# Patient Record
Sex: Male | Born: 1945 | Race: Black or African American | Hispanic: No | Marital: Married | Smoking: Current some day smoker
Health system: Southern US, Community
[De-identification: ages and names within clinical notes are randomized; demographics above are authoritative.]

## PROBLEM LIST (undated history)

## (undated) DIAGNOSIS — I639 Cerebral infarction, unspecified: Secondary | ICD-10-CM

## (undated) DIAGNOSIS — Z860100 Personal history of colon polyps, unspecified: Secondary | ICD-10-CM

## (undated) DIAGNOSIS — T7840XA Allergy, unspecified, initial encounter: Secondary | ICD-10-CM

## (undated) DIAGNOSIS — H269 Unspecified cataract: Secondary | ICD-10-CM

## (undated) DIAGNOSIS — Z720 Tobacco use: Secondary | ICD-10-CM

## (undated) DIAGNOSIS — R911 Solitary pulmonary nodule: Secondary | ICD-10-CM

## (undated) DIAGNOSIS — J432 Centrilobular emphysema: Secondary | ICD-10-CM

## (undated) DIAGNOSIS — M8580 Other specified disorders of bone density and structure, unspecified site: Secondary | ICD-10-CM

## (undated) DIAGNOSIS — R591 Generalized enlarged lymph nodes: Secondary | ICD-10-CM

## (undated) DIAGNOSIS — I2510 Atherosclerotic heart disease of native coronary artery without angina pectoris: Secondary | ICD-10-CM

## (undated) DIAGNOSIS — I700 Atherosclerosis of aorta: Secondary | ICD-10-CM

## (undated) DIAGNOSIS — E878 Other disorders of electrolyte and fluid balance, not elsewhere classified: Secondary | ICD-10-CM

## (undated) DIAGNOSIS — G610 Guillain-Barre syndrome: Secondary | ICD-10-CM

## (undated) DIAGNOSIS — E785 Hyperlipidemia, unspecified: Secondary | ICD-10-CM

## (undated) HISTORY — PX: DEEP NECK LYMPH NODE BIOPSY / EXCISION: SUR126

## (undated) HISTORY — DX: Tobacco use: Z72.0

## (undated) HISTORY — DX: Generalized enlarged lymph nodes: R59.1

## (undated) HISTORY — DX: Centrilobular emphysema: J43.2

## (undated) HISTORY — PX: OTHER SURGICAL HISTORY: SHX169

## (undated) HISTORY — DX: Cerebral infarction, unspecified: I63.9

## (undated) HISTORY — DX: Personal history of colonic polyps: Z86.010

## (undated) HISTORY — DX: Allergy, unspecified, initial encounter: T78.40XA

## (undated) HISTORY — DX: Guillain-Barre syndrome: G61.0

## (undated) HISTORY — PX: LYMPH NODE BIOPSY: SHX201

## (undated) HISTORY — DX: Other disorders of electrolyte and fluid balance, not elsewhere classified: E87.8

## (undated) HISTORY — DX: Unspecified cataract: H26.9

## (undated) HISTORY — DX: Atherosclerosis of aorta: I70.0

## (undated) HISTORY — DX: Hyperlipidemia, unspecified: E78.5

## (undated) HISTORY — DX: Personal history of colon polyps, unspecified: Z86.0100

## (undated) HISTORY — PX: SHOULDER ARTHROSCOPY: SHX128

## (undated) HISTORY — DX: Atherosclerotic heart disease of native coronary artery without angina pectoris: I25.10

## (undated) HISTORY — DX: Other specified disorders of bone density and structure, unspecified site: M85.80

## (undated) HISTORY — DX: Solitary pulmonary nodule: R91.1

---

## 1986-01-09 HISTORY — PX: HERNIA REPAIR: SHX51

## 2004-10-30 ENCOUNTER — Emergency Department: Payer: Self-pay | Admitting: Emergency Medicine

## 2004-10-30 ENCOUNTER — Other Ambulatory Visit: Payer: Self-pay

## 2005-01-05 ENCOUNTER — Ambulatory Visit: Payer: Self-pay | Admitting: Family Medicine

## 2006-01-10 ENCOUNTER — Ambulatory Visit: Payer: Self-pay

## 2006-01-12 ENCOUNTER — Ambulatory Visit: Payer: Self-pay

## 2007-02-22 ENCOUNTER — Ambulatory Visit: Payer: Self-pay | Admitting: Orthopaedic Surgery

## 2008-05-22 ENCOUNTER — Ambulatory Visit: Payer: Self-pay

## 2011-10-19 ENCOUNTER — Ambulatory Visit: Payer: Self-pay | Admitting: Otolaryngology

## 2013-07-09 DIAGNOSIS — G610 Guillain-Barre syndrome: Secondary | ICD-10-CM

## 2013-07-09 HISTORY — DX: Guillain-Barre syndrome: G61.0

## 2013-08-02 ENCOUNTER — Emergency Department: Payer: Self-pay | Admitting: Emergency Medicine

## 2013-08-02 LAB — COMPREHENSIVE METABOLIC PANEL
ANION GAP: 6 — AB (ref 7–16)
Albumin: 3.6 g/dL (ref 3.4–5.0)
Alkaline Phosphatase: 83 U/L
BUN: 12 mg/dL (ref 7–18)
Bilirubin,Total: 0.5 mg/dL (ref 0.2–1.0)
CHLORIDE: 104 mmol/L (ref 98–107)
Calcium, Total: 8.5 mg/dL (ref 8.5–10.1)
Co2: 29 mmol/L (ref 21–32)
Creatinine: 0.89 mg/dL (ref 0.60–1.30)
EGFR (Non-African Amer.): >60
Glucose: 104 mg/dL — ABNORMAL HIGH (ref 65–99)
OSMOLALITY: 278 (ref 275–301)
Potassium: 3.8 mmol/L (ref 3.5–5.1)
SGOT(AST): 46 U/L — ABNORMAL HIGH (ref 15–37)
SGPT (ALT): 39 U/L
Sodium: 139 mmol/L (ref 136–145)
Total Protein: 7.6 g/dL (ref 6.4–8.2)

## 2013-08-02 LAB — CBC
HCT: 44.9 % (ref 40.0–52.0)
HGB: 14.9 g/dL (ref 13.0–18.0)
MCH: 31.1 pg (ref 26.0–34.0)
MCHC: 33.2 g/dL (ref 32.0–36.0)
MCV: 94 fL (ref 80–100)
PLATELETS: 219 10*3/uL (ref 150–440)
RBC: 4.79 10*6/uL (ref 4.40–5.90)
RDW: 13.2 % (ref 11.5–14.5)
WBC: 6.1 10*3/uL (ref 3.8–10.6)

## 2013-08-02 LAB — CK TOTAL AND CKMB (NOT AT ARMC)
CK, Total: 491 U/L — ABNORMAL HIGH
CK-MB: 7.4 ng/mL — ABNORMAL HIGH (ref 0.5–3.6)

## 2013-08-02 LAB — PROTIME-INR
INR: 1.1
Prothrombin Time: 13.8 secs (ref 11.5–14.7)

## 2013-08-02 LAB — APTT: ACTIVATED PTT: 36.7 s — AB (ref 23.6–35.9)

## 2013-08-02 LAB — TROPONIN I: Troponin-I: <0.02 ng/mL

## 2013-08-02 LAB — LIPASE, BLOOD: Lipase: 172 U/L (ref 73–393)

## 2013-08-04 LAB — CBC WITH DIFFERENTIAL/PLATELET
Basophil #: 0.1 10*3/uL (ref 0.0–0.1)
Basophil %: 0.9 %
Eosinophil #: 0.1 10*3/uL (ref 0.0–0.7)
Eosinophil %: 1.8 %
HCT: 45.7 % (ref 40.0–52.0)
HGB: 14.9 g/dL (ref 13.0–18.0)
Lymphocyte #: 1.9 10*3/uL (ref 1.0–3.6)
Lymphocyte %: 33.5 %
MCH: 31 pg (ref 26.0–34.0)
MCHC: 32.6 g/dL (ref 32.0–36.0)
MCV: 95 fL (ref 80–100)
Monocyte #: 0.7 x10 3/mm (ref 0.2–1.0)
Monocyte %: 12.3 %
NEUTROS ABS: 2.9 10*3/uL (ref 1.4–6.5)
NEUTROS PCT: 51.5 %
PLATELETS: 231 10*3/uL (ref 150–440)
RBC: 4.81 10*6/uL (ref 4.40–5.90)
RDW: 13.1 % (ref 11.5–14.5)
WBC: 5.7 10*3/uL (ref 3.8–10.6)

## 2013-08-04 LAB — COMPREHENSIVE METABOLIC PANEL
ALBUMIN: 3.8 g/dL (ref 3.4–5.0)
AST: 38 U/L — AB (ref 15–37)
Alkaline Phosphatase: 77 U/L
Anion Gap: 6 — ABNORMAL LOW (ref 7–16)
BILIRUBIN TOTAL: 0.5 mg/dL (ref 0.2–1.0)
BUN: 11 mg/dL (ref 7–18)
Calcium, Total: 8.8 mg/dL (ref 8.5–10.1)
Chloride: 103 mmol/L (ref 98–107)
Co2: 28 mmol/L (ref 21–32)
Creatinine: 0.78 mg/dL (ref 0.60–1.30)
EGFR (African American): >60
EGFR (Non-African Amer.): >60
Glucose: 86 mg/dL (ref 65–99)
OSMOLALITY: 273 (ref 275–301)
Potassium: 3.7 mmol/L (ref 3.5–5.1)
SGPT (ALT): 33 U/L
Sodium: 137 mmol/L (ref 136–145)
TOTAL PROTEIN: 7.7 g/dL (ref 6.4–8.2)

## 2013-08-04 LAB — SEDIMENTATION RATE: ERYTHROCYTE SED RATE: 1 mm/h (ref 0–20)

## 2013-08-04 LAB — HEMOGLOBIN A1C: HEMOGLOBIN A1C: 6.7 % — AB (ref 4.2–6.3)

## 2013-08-04 LAB — TSH: THYROID STIMULATING HORM: 1.21 u[IU]/mL

## 2013-08-04 LAB — RAPID HIV SCREEN (HIV 1/2 AB+AG)

## 2013-08-05 LAB — CSF CC+PROT+GLU+CULT PANEL
CSF Tube #: 3
Eosinophil: 0 %
GLUCOSE, CSF: 66 mg/dL (ref 40–75)
Lymphocytes: 50 %
MONOCYTES/MACROPHAGES: 50 %
NEUTROS PCT: 0 %
Other Cells: 0 %
Protein, CSF: 30 mg/dL (ref 15–45)
RBC (CSF): 5 /mm3
WBC (CSF): 0 /mm3

## 2013-08-05 LAB — CBC WITH DIFFERENTIAL/PLATELET
Basophil #: 0.1 10*3/uL (ref 0.0–0.1)
Basophil %: 0.9 %
Eosinophil #: 0.1 10*3/uL (ref 0.0–0.7)
Eosinophil %: 2.3 %
HCT: 44.6 % (ref 40.0–52.0)
HGB: 14.4 g/dL (ref 13.0–18.0)
Lymphocyte #: 1.9 10*3/uL (ref 1.0–3.6)
Lymphocyte %: 32.8 %
MCH: 30.9 pg (ref 26.0–34.0)
MCHC: 32.3 g/dL (ref 32.0–36.0)
MCV: 96 fL (ref 80–100)
MONO ABS: 0.7 x10 3/mm (ref 0.2–1.0)
MONOS PCT: 12.4 %
Neutrophil #: 3 10*3/uL (ref 1.4–6.5)
Neutrophil %: 51.6 %
PLATELETS: 218 10*3/uL (ref 150–440)
RBC: 4.67 10*6/uL (ref 4.40–5.90)
RDW: 13 % (ref 11.5–14.5)
WBC: 5.8 10*3/uL (ref 3.8–10.6)

## 2013-08-05 LAB — BASIC METABOLIC PANEL
Anion Gap: 10 (ref 7–16)
BUN: 11 mg/dL (ref 7–18)
CALCIUM: 8.5 mg/dL (ref 8.5–10.1)
CHLORIDE: 101 mmol/L (ref 98–107)
CO2: 27 mmol/L (ref 21–32)
CREATININE: 0.77 mg/dL (ref 0.60–1.30)
EGFR (African American): >60
Glucose: 92 mg/dL (ref 65–99)
Osmolality: 275 (ref 275–301)
POTASSIUM: 3.5 mmol/L (ref 3.5–5.1)
Sodium: 138 mmol/L (ref 136–145)

## 2013-08-06 ENCOUNTER — Inpatient Hospital Stay: Payer: Self-pay | Admitting: Internal Medicine

## 2013-08-07 LAB — BASIC METABOLIC PANEL
ANION GAP: 7 (ref 7–16)
BUN: 14 mg/dL (ref 7–18)
CALCIUM: 9 mg/dL (ref 8.5–10.1)
CREATININE: 0.96 mg/dL (ref 0.60–1.30)
Chloride: 98 mmol/L (ref 98–107)
Co2: 28 mmol/L (ref 21–32)
EGFR (African American): >60
Glucose: 90 mg/dL (ref 65–99)
Osmolality: 266 (ref 275–301)
Potassium: 3.9 mmol/L (ref 3.5–5.1)
Sodium: 133 mmol/L — ABNORMAL LOW (ref 136–145)

## 2013-08-07 LAB — PROT IMMUNOELECTROPHORES(ARMC)

## 2013-08-07 LAB — CK: CK, Total: 261 U/L

## 2013-08-08 ENCOUNTER — Encounter: Payer: Self-pay | Admitting: *Deleted

## 2013-08-08 NOTE — PMR Pre-admission (Signed)
[facility] PMR Admission Coordinator Pre-Admission Assessment  Patient: Brent [name] is an 68 y.o., male MRN: [id number] DOB: 1945/06/21 Height: 5' 9"  (175.3 cm) Weight: 58.922 kg (129 lb 14.4 oz)  Insurance Information HMO:     PPO: yes     PCP:      IPA:      80/20:      OTHER:  PRIMARY: Medicare UHC      Policy#: [id number]      Subscriber: self CM Name: Gaylan Gerold, RN      Phone#:  [phone]    Fax#: onsite reviewer Christean Grief, RN reviewer gave approval for 7 days on 08-07-13. Follow up will be with onsite reviewer Gaylan Gerold. Pre-Cert#: [id number]      Employer: working part time Benefits:  Phone #: [phone]      Name: [name]. Date:      Deduct: none      Out of Pocket Max: $4000 (met $37.22)      Life Max: none CIR: $160/day copay for days 1-10      SNF: $0/day for days 1-20; $50/day for days 21-100 (100 day visit max) Outpatient: 100%     Co-Pay: $20 copay, no visit limits Home Health: 100%      Co-Pay: none, visits based on med necessity DME: 80%     Co-Pay: 20% Providers: in Therapist, art Information   Name Relation Home Work Fowler J  [phone]      Current Medical History  Patient Admitting Diagnosis: Guillain Barre Syndrome  History of Present Illness: This otherwise healthy 68 year old man with past medical history significant only for a recent emergency room visit for abdominal pain (which was attributed to reflux disease) presented to [facility] on 08/06/13 with bilateral hand and foot numbness with right eye ptosis. These symptoms were present upon waking up on the morning of 08-06-13. Pt reports that he was in his usual state of health the day prior. He describes a dense numbness of the hands and feet, up to the wrist and the feet up to the ankles with some aching in the left lower calf. He has no change in vision upon admit but now has  complaints of double vision with right eye ptosis remaining. Pt did report some spasticity and change in the motion of both legs. Pt also reports dizziness upon standing and fatigue. CT head was negative for acute changes and MRI brain was also negative for acute changes. 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 MRI lumbar spine was also non-remarkable. Pt with acute onset of polyneuropathy and work up began for Guillain-Barre syndrome. Lumbar puncture was performed and demonstrated normal protein in CSF. However, per neurologist, pt's clinical pattern follows this disease process and IVIG treatments were initiated (for a total of 5 IVIG treatments). Pt's final IVIG treatment was on 08-09-13. Therapy recommended inpatient rehab be considered and pt is progressing well with therapies though demonstrating a marked decline in his overall function from his very independent baseline.  Patient's medical [facility] from [facility] has been reviewed by the rehabilitation admission coordinator and physician.   Past Medical History  Pt does not take any chronic medications. Past surgical history includes: lymph node removal from the right neck (benign), colonoscopy and hernia repair.  Family History  family history is not on file.  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 Pt is very active per wife.   Home Assistive Devices / Equipment Home Assistive Devices/Equipment: None   Prior Functional Level Current Functional Level  Bed Mobility   Independent  Independent   Transfers  Independent  Mod assist (Minimal to moderate assist) Pt leans heavily against bed for support.    Mobility - Walk/Wheelchair  Independent  Mod assist (Pt ambulated 25' with rolling walker and moderate assist, significant steppage gait, limited knee control and inconsistent foot placement/step height)  Fatigue and poor activity tolerance noted.   Upper Body Dressing  Independent  Min assist   Lower Body Dressing  Independent  Mod assist   Grooming  Independent  Mod assist (sig. sensory issues with L UE/decreased grip L hand)   Eating/Drinking  Independent  Mod assist (difficulty holding utensil, issued built up handle)   Toilet Transfer  Independent  Mod assist   Bladder Continence   WFL  currently using urinal   Bowel Management  WFL  no incontinence, using bed pan   Stair Climbing   Independent  Other (not assessed)   Communication  [facility]  WFL   Memory  WFL  appears WFL   Cooking/Meal Prep  Independent      Housework  Independent    Money Management  Independent    Driving   Independent     Special needs/care consideration BiPAP/CPAP no  CPM no  Continuous Drip IV no  Dialysis no         Life Vest no Oxygen no  Special Bed no  Trach Size no  Wound Vac (area) no        Skin- no issues                               Bowel mgmt: last BM on 08-10-13  Bladder mgmt:using urinal or BSC with assist Diabetic mgmt no  Previous Home Environment Living Arrangements: Spouse/significant other  Lives With: Spouse Available Help at Discharge: Family Type of Home: House Home Layout: Two level (could stay on first floor but not preferred) Alternate Level Stairs-Number of Steps: flight of steps to 2nd floor bedrooms Home Access: Stairs to enter Entrance Stairs-Rails: Right Entrance Stairs-Number of Steps: 3  Discharge Living Setting Plans for Discharge Living Setting: Patient's home Type of Home at Discharge:  House Discharge Home Layout: Two level Alternate Level Stairs-Number of Steps: flight of steps to second floor bedroom Discharge Home Access: Stairs to enter Entrance Stairs-Rails: Right Entrance Stairs-Number of Steps: 3 Does the patient have any problems obtaining your medications?: No  Social/Family/Support Systems Patient Roles: Spouse;Other (Comment) (works part time driving a truck, does yard work for four houses. 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 Wife thought she would have to go into work one day/week and that family can stay with pt then. (Pt's dtr or neighbor) Caregiver Availability:  (essentially 24-7  as wife will also be working from home) Discharge Plan Discussed with Primary Caregiver: Yes (discussed with wife and pt on 7-30 and 7-31) Is Caregiver In Agreement with Plan?: Yes Does Caregiver/Family have Issues with Lodging/Transportation while Pt is in Rehab?: No  Goals/Additional Needs Patient/Family Goal for Rehab: Supervision and minimal assist with PT/OT; supervision with SLP Expected length of stay: 14-18 days Cultural Considerations: none Dietary Needs: regular mechanical soft diet, meds in puree, thin liquids with no straws Equipment Needs: to be determined Pt/Family Agrees to Admission and willing to participate: Yes Program Orientation Provided & Reviewed with Pt/Caregiver Including Roles  & Responsibilities: Yes  Patient Condition: I met with patient and his wife at [facility] on 08-07-13 and 08-08-13 to discuss the purpose of acute inpatient rehabilitation. This 68 year old patient was previously very independent working part time, doing yard work for [employer] and [employer] prior to  this new diagnosis of Guillain Barre syndrome. Pt is currently requiring minimal to moderate assist with transfers and moderate assist to ambulate 25 with a rolling walker, demonstrating a high steppage gait pattern. Pt's ability to perform self care skills has been compromised with pt needing moderate assistance overall with activities of daily living. In addition, pt currently needs a mechanical soft diet with meds in puree per speech recommendations. Pt will benefit from further skilled speech services to progress his diet needs and monitor for any progression of Guillain Barre impacting his swallow function. He will benefit greatly from the multi-disciplinary team of skilled PT, OT and rehab nursing to focus on increasing strength for greater independence in transfers, gait, self care skills and ambulation. In addition, rehab nursing can help address medication issues and provide pt/family education on medication issues/bowel/bladder care. Pt will benefit from rehab physician intervention to monitor any progression of Guillain Barre. Discussed case with rehab team and Dr. Naaman Plummer who stated that pt is a good inpatient rehab candidate. We received medical clearance from Dr. Leslye Peer at [facility] and insurance authorization from [facility] for inpatient rehab. Pt and his wife are motivated to come to inpatient rehab and to get pt as independent as possible for home. Pt will benefit from the intensive services of skilled therapy under rehab physician guidance and will be admitted today on 08-11-13.   Preadmission Screen Completed By:  Nanetta Batty PT, 08/11/2013 0918 am ______________________________________________________________________   Discussed status with Dr. Naaman Plummer on 08/11/13 at [phone] and received telephone approval for admission today.  Admission Coordinator:  Nanetta Batty PT, time 0918/Date 08/11/13   Assessment/Plan: Diagnosis: GBS with tetraplegia 1. Does the need for close, 24 hr/day   Medical supervision in concert with the patient's rehab needs make it unreasonable for this patient to be served in a less intensive setting? Yes 2. Co-Morbidities requiring supervision/potential complications: pain, bowel,bladder 3. Due to bladder management, bowel management, safety, skin/wound care, disease management, medication administration, pain management and patient education, does the patient require 24 hr/day rehab nursing? Yes 4. Does the patient require coordinated care of a physician, rehab nurse, PT (1-2 hrs/day, 5 days/week), OT (1-2 hrs/day, 5 days/week) and SLP (1-2 hrs/day, 5 days/week) to address physical and functional deficits in the context of the above medical diagnosis(es)? Yes Addressing deficits in the following areas: balance, endurance, locomotion, strength, transferring, bowel/bladder control, bathing, dressing, feeding, grooming, toileting, speech, swallowing and psychosocial support 5. Can the patient actively participate in an intensive therapy program of at least 3 hrs of therapy 5 days  a week? Yes 6. The potential for patient to make measurable gains while on inpatient rehab is excellent 7. Anticipated functional outcomes upon discharge from inpatients are: supervision and min assist PT, supervision and min assist OT, supervision SLP 8. Estimated rehab length of stay to reach the above functional goals is: 14-18 days 9. Does the patient have adequate social supports to accommodate these discharge functional goals? Yes 10. Anticipated D/C setting: Home 11. Anticipated post D/C treatments: HH therapy and Outpatient therapy 12. Overall Rehab/Functional Prognosis: excellent    RECOMMENDATIONS: This patient's condition is appropriate for continued rehabilitative care in the following setting: CIR Patient has agreed to participate in recommended program. Yes Note that insurance prior authorization may be required for reimbursement for recommended care.  Comment:  Admit to CIR today.   [name], MD, [place], [place] 8/03//2015

## 2013-08-11 ENCOUNTER — Encounter (HOSPITAL_COMMUNITY): Payer: Self-pay | Admitting: *Deleted

## 2013-08-11 ENCOUNTER — Other Ambulatory Visit: Payer: Self-pay | Admitting: Physical Medicine and Rehabilitation

## 2013-08-11 ENCOUNTER — Inpatient Hospital Stay (HOSPITAL_COMMUNITY)
Admission: RE | Admit: 2013-08-11 | Discharge: 2013-08-18 | DRG: 945 | Disposition: A | Discharge status: Home Health | Payer: PRIVATE HEALTH INSURANCE | Source: Intra-hospital | Attending: Physical Medicine & Rehabilitation | Admitting: Physical Medicine & Rehabilitation

## 2013-08-11 ENCOUNTER — Encounter: Payer: Self-pay | Admitting: Physical Medicine and Rehabilitation

## 2013-08-11 DIAGNOSIS — F17200 Nicotine dependence, unspecified, uncomplicated: Secondary | ICD-10-CM | POA: Diagnosis present

## 2013-08-11 DIAGNOSIS — H532 Diplopia: Secondary | ICD-10-CM | POA: Diagnosis present

## 2013-08-11 DIAGNOSIS — M47817 Spondylosis without myelopathy or radiculopathy, lumbosacral region: Secondary | ICD-10-CM | POA: Diagnosis present

## 2013-08-11 DIAGNOSIS — R110 Nausea: Secondary | ICD-10-CM | POA: Diagnosis not present

## 2013-08-11 DIAGNOSIS — Z833 Family history of diabetes mellitus: Secondary | ICD-10-CM

## 2013-08-11 DIAGNOSIS — R471 Dysarthria and anarthria: Secondary | ICD-10-CM | POA: Diagnosis present

## 2013-08-11 DIAGNOSIS — K5900 Constipation, unspecified: Secondary | ICD-10-CM | POA: Diagnosis present

## 2013-08-11 DIAGNOSIS — H538 Other visual disturbances: Secondary | ICD-10-CM | POA: Diagnosis present

## 2013-08-11 DIAGNOSIS — M5126 Other intervertebral disc displacement, lumbar region: Secondary | ICD-10-CM | POA: Diagnosis present

## 2013-08-11 DIAGNOSIS — E871 Hypo-osmolality and hyponatremia: Secondary | ICD-10-CM | POA: Diagnosis present

## 2013-08-11 DIAGNOSIS — Z7982 Long term (current) use of aspirin: Secondary | ICD-10-CM | POA: Diagnosis not present

## 2013-08-11 DIAGNOSIS — K219 Gastro-esophageal reflux disease without esophagitis: Secondary | ICD-10-CM | POA: Diagnosis present

## 2013-08-11 DIAGNOSIS — Z8669 Personal history of other diseases of the nervous system and sense organs: Secondary | ICD-10-CM | POA: Diagnosis present

## 2013-08-11 DIAGNOSIS — H02409 Unspecified ptosis of unspecified eyelid: Secondary | ICD-10-CM | POA: Diagnosis present

## 2013-08-11 DIAGNOSIS — R259 Unspecified abnormal involuntary movements: Secondary | ICD-10-CM | POA: Diagnosis present

## 2013-08-11 DIAGNOSIS — G610 Guillain-Barre syndrome: Secondary | ICD-10-CM | POA: Diagnosis present

## 2013-08-11 DIAGNOSIS — M549 Dorsalgia, unspecified: Secondary | ICD-10-CM

## 2013-08-11 DIAGNOSIS — Z5189 Encounter for other specified aftercare: Principal | ICD-10-CM

## 2013-08-11 DIAGNOSIS — R1310 Dysphagia, unspecified: Secondary | ICD-10-CM | POA: Diagnosis present

## 2013-08-11 DIAGNOSIS — Z79899 Other long term (current) drug therapy: Secondary | ICD-10-CM

## 2013-08-11 DIAGNOSIS — R292 Abnormal reflex: Secondary | ICD-10-CM | POA: Diagnosis present

## 2013-08-11 DIAGNOSIS — Z823 Family history of stroke: Secondary | ICD-10-CM | POA: Diagnosis not present

## 2013-08-11 MED ORDER — POLYETHYLENE GLYCOL 3350 17 G PO PACK
17.0000 g | PACK | Freq: Every day | ORAL | Status: DC | PRN
  Administered 2013-08-13 – 2013-08-17 (×5): 17 g via ORAL
  Filled 2013-08-11 (×4): qty 1

## 2013-08-11 MED ORDER — PANTOPRAZOLE SODIUM 40 MG PO TBEC
40.0000 mg | DELAYED_RELEASE_TABLET | Freq: Every day | ORAL | Status: DC
  Administered 2013-08-12 – 2013-08-18 (×7): 40 mg via ORAL
  Filled 2013-08-11 (×7): qty 1

## 2013-08-11 MED ORDER — PROCHLORPERAZINE EDISYLATE 5 MG/ML IJ SOLN
5.0000 mg | Freq: Four times a day (QID) | INTRAMUSCULAR | Status: DC | PRN
  Filled 2013-08-11: qty 2

## 2013-08-11 MED ORDER — ENOXAPARIN SODIUM 40 MG/0.4ML ~~LOC~~ SOLN
40.0000 mg | SUBCUTANEOUS | Status: DC
  Administered 2013-08-11 – 2013-08-17 (×7): 40 mg via SUBCUTANEOUS
  Filled 2013-08-11 (×8): qty 0.4

## 2013-08-11 MED ORDER — NICOTINE 7 MG/24HR TD PT24
7.0000 mg | MEDICATED_PATCH | Freq: Every day | TRANSDERMAL | Status: DC
  Administered 2013-08-12 – 2013-08-18 (×7): 7 mg via TRANSDERMAL
  Filled 2013-08-11 (×8): qty 1

## 2013-08-11 MED ORDER — ACETAMINOPHEN 325 MG PO TABS
325.0000 mg | ORAL_TABLET | ORAL | Status: DC | PRN
  Administered 2013-08-13 – 2013-08-16 (×5): 650 mg via ORAL
  Administered 2013-08-17: 325 mg via ORAL
  Administered 2013-08-17 – 2013-08-18 (×2): 650 mg via ORAL
  Filled 2013-08-11 (×10): qty 2

## 2013-08-11 MED ORDER — TRAZODONE HCL 50 MG PO TABS: 25.0000 mg | ORAL_TABLET | Freq: Every evening | ORAL | Status: DC | PRN

## 2013-08-11 MED ORDER — FLEET ENEMA 7-19 GM/118ML RE ENEM: 1.0000 | ENEMA | Freq: Once | RECTAL | Status: AC | PRN

## 2013-08-11 MED ORDER — ALUM & MAG HYDROXIDE-SIMETH 200-200-20 MG/5ML PO SUSP
30.0000 mL | ORAL | Status: DC | PRN
  Administered 2013-08-14 – 2013-08-16 (×2): 30 mL via ORAL
  Filled 2013-08-11 (×2): qty 30

## 2013-08-11 MED ORDER — OXYCODONE HCL 5 MG PO TABS
5.0000 mg | ORAL_TABLET | ORAL | Status: DC | PRN
  Administered 2013-08-11 – 2013-08-13 (×5): 10 mg via ORAL
  Administered 2013-08-13: 5 mg via ORAL
  Administered 2013-08-14 – 2013-08-15 (×8): 10 mg via ORAL
  Administered 2013-08-16: 5 mg via ORAL
  Administered 2013-08-17: 10 mg via ORAL
  Administered 2013-08-17 (×2): 5 mg via ORAL
  Administered 2013-08-17 – 2013-08-18 (×3): 10 mg via ORAL
  Filled 2013-08-11 (×18): qty 2
  Filled 2013-08-11 (×2): qty 1
  Filled 2013-08-11 (×5): qty 2

## 2013-08-11 MED ORDER — METHOCARBAMOL 500 MG PO TABS
500.0000 mg | ORAL_TABLET | Freq: Four times a day (QID) | ORAL | Status: DC | PRN
  Administered 2013-08-11 – 2013-08-17 (×9): 500 mg via ORAL
  Filled 2013-08-11 (×9): qty 1

## 2013-08-11 MED ORDER — PROCHLORPERAZINE MALEATE 5 MG PO TABS
5.0000 mg | ORAL_TABLET | Freq: Four times a day (QID) | ORAL | Status: DC | PRN
  Filled 2013-08-11: qty 2

## 2013-08-11 MED ORDER — ASPIRIN EC 81 MG PO TBEC
81.0000 mg | DELAYED_RELEASE_TABLET | Freq: Every day | ORAL | Status: DC
  Administered 2013-08-12 – 2013-08-18 (×7): 81 mg via ORAL
  Filled 2013-08-11 (×8): qty 1

## 2013-08-11 MED ORDER — BISACODYL 10 MG RE SUPP
10.0000 mg | Freq: Every day | RECTAL | Status: DC | PRN
  Administered 2013-08-17: 10 mg via RECTAL
  Filled 2013-08-11: qty 1

## 2013-08-11 MED ORDER — PROCHLORPERAZINE 25 MG RE SUPP
12.5000 mg | Freq: Four times a day (QID) | RECTAL | Status: DC | PRN
  Filled 2013-08-11: qty 1

## 2013-08-11 MED ORDER — GUAIFENESIN-DM 100-10 MG/5ML PO SYRP: 5.0000 mL | ORAL_SOLUTION | Freq: Four times a day (QID) | ORAL | Status: DC | PRN

## 2013-08-11 NOTE — Progress Notes (Signed)
Patient arrived onto unit via [facility]. Patient's wife at bedside oriented to rehab, all questions answered. Report given to on-coming nurses.

## 2013-08-12 ENCOUNTER — Inpatient Hospital Stay (HOSPITAL_COMMUNITY): Payer: PRIVATE HEALTH INSURANCE | Admitting: Speech Pathology

## 2013-08-12 ENCOUNTER — Inpatient Hospital Stay (HOSPITAL_COMMUNITY): Payer: Medicare Other

## 2013-08-12 ENCOUNTER — Inpatient Hospital Stay (HOSPITAL_COMMUNITY): Payer: PRIVATE HEALTH INSURANCE

## 2013-08-12 LAB — CBC WITH DIFFERENTIAL/PLATELET
BASOS PCT: 1 % (ref 0–1)
Basophils Absolute: 0.1 10*3/uL (ref 0.0–0.1)
EOS PCT: 1 % (ref 0–5)
Eosinophils Absolute: 0.1 10*3/uL (ref 0.0–0.7)
HEMATOCRIT: 40.2 % (ref 39.0–52.0)
HEMOGLOBIN: 14.5 g/dL (ref 13.0–17.0)
LYMPHS ABS: 1.8 10*3/uL (ref 0.7–4.0)
Lymphocytes Relative: 27 % (ref 12–46)
MCH: 32.2 pg (ref 26.0–34.0)
MCHC: 36.1 g/dL — ABNORMAL HIGH (ref 30.0–36.0)
MCV: 89.1 fL (ref 78.0–100.0)
MONOS PCT: 24 % — AB (ref 3–12)
Monocytes Absolute: 1.6 10*3/uL — ABNORMAL HIGH (ref 0.1–1.0)
Neutro Abs: 3.1 10*3/uL (ref 1.7–7.7)
Neutrophils Relative %: 47 % (ref 43–77)
Platelets: 234 10*3/uL (ref 150–400)
RBC: 4.51 MIL/uL (ref 4.22–5.81)
RDW: 12.7 % (ref 11.5–15.5)
WBC: 6.7 10*3/uL (ref 4.0–10.5)

## 2013-08-12 LAB — PATHOLOGIST SMEAR REVIEW: Path Review: REACTIVE

## 2013-08-12 LAB — COMPREHENSIVE METABOLIC PANEL
ALT: 25 U/L (ref 0–53)
AST: 44 U/L — ABNORMAL HIGH (ref 0–37)
Albumin: 3.5 g/dL (ref 3.5–5.2)
Alkaline Phosphatase: 75 U/L (ref 39–117)
Anion gap: 11 (ref 5–15)
BILIRUBIN TOTAL: 1.7 mg/dL — AB (ref 0.3–1.2)
BUN: 22 mg/dL (ref 6–23)
CHLORIDE: 89 meq/L — AB (ref 96–112)
CO2: 27 meq/L (ref 19–32)
Calcium: 9 mg/dL (ref 8.4–10.5)
Creatinine, Ser: 0.67 mg/dL (ref 0.50–1.35)
GLUCOSE: 88 mg/dL (ref 70–99)
Potassium: 4.6 mEq/L (ref 3.7–5.3)
Sodium: 127 mEq/L — ABNORMAL LOW (ref 137–147)
Total Protein: 9.6 g/dL — ABNORMAL HIGH (ref 6.0–8.3)

## 2013-08-12 MED ORDER — BOOST / RESOURCE BREEZE PO LIQD
1.0000 | Freq: Two times a day (BID) | ORAL | Status: DC
  Administered 2013-08-12 – 2013-08-16 (×6): 1 via ORAL
  Administered 2013-08-17: 08:00:00 via ORAL
  Administered 2013-08-17: 8 via ORAL
  Administered 2013-08-18: 1 via ORAL

## 2013-08-12 NOTE — Evaluation (Signed)
Occupational Therapy Assessment and Plan  Patient Details  Name: Brent [name] MRN: [id number] Date of Birth: 20-Nov-1945  OT Diagnosis: disturbance of vision, lumbago (low back pain), muscle weakness (generalized) and pain in joint Rehab Potential: Rehab Potential: Excellent ELOS: 5-7 days   Today's Date: 08/12/2013 Time: 0830-0930 Time Calculation (min): 60 min  Problem List:  Patient Active Problem List   Diagnosis Date Noted  . 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 Medical history non-contributory    Past Surgical History:  Past Surgical History  Procedure Laterality Date  . Hernia repair    . Lymph node biopsy    . Deep neck lymph node biopsy / excision Right   . Shoulder    . Shoulder arthroscopy Right     Assessment & Plan Clinical Impression: Patient is a 68 y.o. year old male with past medical history significant only for a recent emergency room visit for abdominal pain (which was attributed to reflux disease) presented to [facility] on 08/06/13 with bilateral hand and foot numbness with right eye ptosis. These symptoms were present upon waking up on the morning of 08-06-13. Pt reports that he was in his usual state of health the day prior. He describes a dense numbness of the hands and feet, up to the wrist and the feet up to the ankles with some aching in the left lower calf. He has no change in vision upon admit but now has complaints of double vision with right eye ptosis remaining. Pt did report some spasticity and change in the motion of both legs. Pt also reports dizziness upon standing and fatigue. CT head was negative for acute changes and MRI brain was also negative for acute changes. 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 MRI lumbar spine was also non-remarkable. Pt with acute onset of polyneuropathy and work up began for  Guillain-Barre syndrome. Lumbar puncture was performed and demonstrated normal protein in CSF. However, per neurologist, pt's clinical pattern follows this disease process and IVIG treatments were initiated (for a total of 5 IVIG treatments). Pt's final IVIG treatment was on 08-09-13. Therapy recommended inpatient rehab be considered and pt is progressing well with therapies though demonstrating a marked decline in his overall function from his very independent baseline.  Patient transferred to CIR on 08/11/2013.    Patient currently requires minimum assistance with basic self-care skills secondary to muscle weakness.  Prior to hospitalization, patient could complete BADL/iADL independently.   Patient will benefit from skilled intervention to increase independence with basic self-care skills prior to discharge home independently.  Anticipate patient will require intermittent distant supervision and follow up home health to progress with complex iADL.  OT - End of Session Activity Tolerance: Tolerates 30+ min activity with multiple rests Endurance Deficit: Yes OT Assessment Rehab Potential: Excellent OT Patient demonstrates impairments in the following area(s): Balance;Endurance;Pain;Safety;Vision OT Basic ADL's Functional Problem(s): Bathing;Dressing;Toileting OT Advanced ADL's Functional Problem(s): Simple Meal Preparation;Light Housekeeping OT Transfers Functional Problem(s): Toilet;Tub/Shower OT Additional Impairment(s): Other (comment) OT Plan OT Intensity: Minimum of 1-2 x/day, 45 to 90 minutes OT Frequency: 5 out of 7 days OT Duration/Estimated Length of Stay: 5-7 days OT Treatment/Interventions: Balance/vestibular training;Discharge planning;DME/adaptive equipment instruction;Patient/family education;Therapeutic Activities;Therapeutic Exercise;UE/LE Coordination activities;UE/LE Strength taining/ROM OT Self Feeding Anticipated Outcome(s): Independent OT Basic Self-Care Anticipated  Outcome(s): Mod I OT Toileting Anticipated Outcome(s): Mod I OT Bathroom Transfers  Anticipated Outcome(s): Mod I OT Recommendation Patient destination: Home Follow Up Recommendations: Home health OT Equipment Recommended: To be determined   Skilled Therapeutic Intervention OT 1:1 initial evaluation completed with treatment provided to address safety awareness, dynamic standing balance, pain management, functional transfers and training on effective use of DME/AE.  Pt completed shower level bathing but had no clothing for dressing and demo'd moderate fatigue near end of session.   Pt completed ADL at overall min assist level d/t low back pain with need for physical assist with lower body dressing (socks) and intermittent steadying while standing during ADL.  OT Evaluation Precautions/Restrictions  Precautions Precautions: Fall Restrictions Weight Bearing Restrictions: No  General Chart Reviewed: Yes Family/Caregiver Present: No  Pain Pain Assessment Pain Assessment: 0-10 Pain Score: 8  Pain Type: Acute pain Pain Location: Back Pain Orientation: Lower Pain Descriptors / Indicators: Spasm;Cramping Pain Onset: On-going Patients Stated Pain Goal: 3 Pain Intervention(s): Medication (See eMAR) Multiple Pain Sites: Yes 2nd Pain Site Pain Score: 10 Pain Type: Acute pain Pain Location: Hand Pain Orientation: Left Pain Radiating Towards: thumb, IP joint Pain Descriptors / Indicators: Shooting Pain Frequency: Intermittent Pain Onset: With Activity  Home Living/Prior Functioning Home Living Available Help at Discharge: Family Type of Home: House Home Access: Stairs to enter Technical brewer of Steps: 3 Entrance Stairs-Rails: Right Home Layout: Two level Alternate Level Stairs-Number of Steps: flight of 13 steps to 2nd floor bedrooms Alternate Level Stairs-Rails: Right  Lives With: Spouse IADL History Homemaking Responsibilities: Yes Meal Prep Responsibility:  Primary Laundry Responsibility: No Cleaning Responsibility: Primary Bill Paying/Finance Responsibility: Secondary Shopping Responsibility: Secondary Child Care Responsibility: No Homemaking Comments: Pt cooks wife bakes; pt does all housework (heavy housework) Current License: Yes Occupation: Part time employment Prior Function  Able to Take Stairs?: Yes Driving: Yes Vocation: Part time employment Vocation Requirements: bending, lifting, pulling, pushing, dynamic standing, endurance, [place] Leisure: Hobbies-yes (Comment)  ADL ADL ADL Comments: see FIM  Vision/Perception  Vision- History Baseline Vision/History: Wears glasses Wears Glasses: Reading only Patient Visual Report: Eye fatigue/eye pain/headache (right eye weakness) Vision- Assessment Vision Assessment?: Vision impaired- to be further tested in functional context Perception Comments: WFL   Cognition Overall Cognitive Status: Within Functional Limits for tasks assessed Arousal/Alertness: Awake/alert Orientation Level: Oriented X4 Attention: Alternating Alternating Attention: Appears intact Memory: Impaired Memory Impairment: Retrieval deficit (word-finding deficits) Awareness: Appears intact Problem Solving: Appears intact Safety/Judgment: Appears intact  Sensation Sensation Light Touch: Impaired Detail Light Touch Impaired Details: Impaired RUE;Impaired LUE;Impaired RLE;Impaired LLE Stereognosis: Appears Intact Hot/Cold: Appears Intact Proprioception: Impaired Detail Proprioception Impaired Details: Impaired LUE Additional Comments: decreased sensation at fingertips and feet below ankles.   Reports decreased coordination with left hand [facility]) Coordination Gross Motor Movements are Fluid and Coordinated: Yes Fine Motor Movements are Fluid and Coordinated: No (clumsiness with left hand)  Motor  Motor Motor: Within Functional Limits  Mobility  Bed Mobility Bed Mobility: Rolling Right;Supine to Sit;Sit to  Supine;Scooting to [facility] Rolling Right: 6: Modified independent (Device/Increase time) Supine to Sit: 6: Modified independent (Device/Increase time) Sit to Supine: 6: Modified independent (Device/Increase time) Scooting to [facility]: 6: Modified independent (Device/Increase time) Transfers Transfers: Sit to Stand;Stand to Sit Sit to Stand: 4: Min assist Sit to Stand Details: Verbal cues for technique;Verbal cues for precautions/safety Stand to Sit: 4: Min assist Stand to Sit Details (indicate cue type and reason): Verbal cues for technique;Verbal cues for precautions/safety   Trunk/Postural Assessment  Cervical Assessment Cervical Assessment: Within Functional Limits Thoracic Assessment Thoracic Assessment:  Within Functional Limits Lumbar Assessment Lumbar Assessment: Within Functional Limits Postural Control Postural Control: Within Functional Limits   Balance Balance Balance Assessed: Yes  Extremity/Trunk Assessment RUE Assessment RUE Assessment: Within Functional Limits LUE Assessment LUE Assessment: Within Functional Limits  FIM:  FIM - Bathing Bathing Steps Patient Completed: Chest;Right Arm;Left Arm;Abdomen;Front perineal area;Buttocks;Right upper leg;Left upper leg;Right lower leg (including foot);Left lower leg (including foot) Bathing: 5: Supervision: Safety issues/verbal cues FIM - Upper Body Dressing/Undressing Upper body dressing/undressing: 0: Wears gown/pajamas-no public clothing FIM - Lower Body Dressing/Undressing Lower body dressing/undressing: 0: Wears Interior and spatial designer FIM - Control and instrumentation engineer Devices: Copy: 6: Supine > Sit: No assist;4: Bed > Chair or W/C: Min A (steadying Pt. > 75%);4: Chair or W/C > Bed: Min A (steadying Pt. > 75%) FIM - Radio producer Devices: Mining engineer Transfers: 4-To toilet/BSC: Min A (steadying Pt. > 75%);4-From toilet/BSC: Min A  (steadying Pt. > 75%) FIM - Tub/Shower Transfers Tub/Shower Assistive Devices: Tub transfer bench;Walk in shower;Walker;Grab bars Tub/shower Transfers: 4-Into Tub/Shower: Min A (steadying Pt. > 75%/lift 1 leg);4-Out of Tub/Shower: Min A (steadying Pt. > 75%/lift 1 leg)   Refer to Care Plan for Long Term Goals  Recommendations for other services: None  Discharge Criteria: Patient will be discharged from OT if patient refuses treatment 3 consecutive times without medical reason, if treatment goals not met, if there is a change in medical status, if patient makes no progress towards goals or if patient is discharged from hospital.  The above assessment, treatment plan, treatment alternatives and goals were discussed and mutually agreed upon: by patient  [facility] 08/12/2013, 12:29 PM

## 2013-08-12 NOTE — Progress Notes (Signed)
[facility] Admission Coordinator Signed Physical Medicine and Rehabilitation PMR Pre-admission Service date: 08/08/2013 12:58 PM  Related encounter: Documentation from 08/08/2013 in [facility] PMR Admission Coordinator Pre-Admission Assessment   Patient: Brent [name] is an 68 y.o., male MRN: [id number] DOB: [birth year] Height: 5' 9"  (175.3 cm) Weight: 58.922 kg (129 lb 14.4 oz)   Insurance Information HMO:     PPO: yes     PCP:      IPA:      80/20:      OTHER:   PRIMARY: Medicare U[employer]      Policy#: [id number]      Subscriber: self CM Name: Brent Gerold, RN      Phone#:  [phone]    Fax#: onsite reviewer Christean Grief, RN reviewer gave approval for 7 days on 08-07-13. Follow up will be with onsite reviewer Brent [name]. Pre-Cert#: [id number]      Employer: working part time Benefits:  Phone #: [phone]        Name: [name]. Date:      Deduct: none      Out of Pocket Max: $4000 (met $37.22)      Life Max: none CIR: $160/day copay for days 1-10      SNF: $0/day for days 1-20; $50/day for days 21-100 (100 day visit max) Outpatient: 100%     Co-Pay: $20 copay, no visit limits Home Health: 100%      Co-Pay: none, visits based on med necessity DME: 80%     Co-Pay: 20% Providers: in Therapist, art Information     Name  Relation  Home  Work  Harper J    [phone]  [phone]  [phone]         Current Medical History  Patient Admitting Diagnosis: Guillain Barre Syndrome   History of Present Illness: This otherwise healthy 68 year old man with past medical history significant only for a recent emergency room visit for abdominal pain (which was attributed to reflux disease) presented to [facility] on 08/06/13 with bilateral hand and foot numbness with right eye ptosis. These symptoms were present upon waking up on the morning of  08-06-13. Pt reports that he was in his usual state of health the day prior. He describes a dense numbness of the hands and feet, up to the wrist and the feet up to the ankles with some aching in the left lower calf. He has no change in vision upon admit but now has complaints of double vision with right eye ptosis remaining. Pt did report some spasticity and change in the motion of both legs. Pt also reports dizziness upon standing and fatigue. CT head was negative for acute changes and MRI brain was also negative for acute changes. 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 MRI lumbar spine was also non-remarkable. Pt with acute onset of polyneuropathy and work up began for Guillain-Barre syndrome. Lumbar puncture was performed and demonstrated normal protein in CSF. However, per neurologist, pt's clinical pattern follows this disease process and IVIG treatments were initiated (for a total of 5 IVIG treatments). Pt's final IVIG treatment was on 08-09-13. Therapy recommended inpatient rehab be considered and pt is progressing well with therapies though demonstrating a marked decline in his overall function from his very independent baseline.   Patient[facility] has been reviewed by the rehabilitation admission coordinator and physician.     Past Medical History  Pt does not take any chronic medications. Past surgical history includes: lymph node removal from the right neck (benign), colonoscopy and hernia repair.   Family History  family history is not on file.   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 Pt is very active per wife.    Home Assistive Devices / Equipment Home Assistive Devices/Equipment: None      Prior Functional Level  Current Functional Level   Bed Mobility   Independent   Independent    Transfers   Independent   Mod assist (Minimal to moderate assist) Pt leans heavily against bed for support.      Mobility - Walk/Wheelchair   Independent   Mod assist (Pt ambulated 25' with rolling walker and moderate assist, significant steppage gait, limited knee control and inconsistent foot placement/step height)   Fatigue and poor activity tolerance noted.    Upper Body Dressing   Independent   Min assist    Lower Body Dressing   Independent   Mod assist    Grooming   Independent   Mod assist (sig. sensory issues with L UE/decreased grip L hand)    Eating/Drinking   Independent   Mod assist (difficulty holding utensil, issued built up handle)    Toilet Transfer   Independent   Mod assist    Bladder Continence     WFL   currently using urinal    Bowel Management   WFL   no incontinence, using bed pan    Stair Climbing   Independent   Other (not assessed)    Communication   [facility]   WFL    Memory   WFL   appears WFL    Cooking/Meal Prep   Independent        Housework   Independent      Money Management   Independent      Driving   Independent         Special needs/care consideration BiPAP/CPAP no   CPM no   Continuous Drip IV no   Dialysis no          Life Vest no Oxygen no   Special Bed no   Trach Size no   Wound Vac (area) no         Skin- no issues                                Bowel mgmt: last BM on 08-10-13          Bladder mgmt:using urinal or BSC with assist Diabetic mgmt no   Previous Home Environment Living Arrangements: Spouse/significant other  Lives With: Spouse Available Help at Discharge:  Family Type of Home: House Home Layout: Two level (could stay on first floor but not preferred) Alternate Level Stairs-Number of Steps: flight of steps to 2nd floor bedrooms Home Access: Stairs to enter Entrance Stairs-Rails: Right Entrance Stairs-Number of Steps: 3  Discharge Living Setting Plans for Discharge Living Setting: Patient's home Type of Home at Discharge: House Discharge Home Layout: Two level Alternate Level Stairs-Number of Steps: flight of steps to second floor bedroom Discharge Home Access: Stairs to enter Entrance Stairs-Rails: Right Entrance Stairs-Number of Steps: 3 Does the patient have any problems obtaining your medications?: No   Social/Family/Support Systems Patient Roles: Spouse;Other (Comment) (works part time driving a truck, 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 Wife thought she would have to go into work one day/week and that family can stay with pt then. (Pt's dtr or neighbor) Caregiver Availability:  (essentially 24-7 as wife will also be working from home) Discharge Plan Discussed with Primary Caregiver: Yes (discussed with wife and pt on 7-30 and 7-31) Is Caregiver In Agreement with Plan?: Yes Does Caregiver/Family have Issues with Lodging/Transportation while Pt is in Rehab?: No   Goals/Additional Needs Patient/Family Goal for Rehab: Supervision and minimal assist with PT/OT; supervision with SLP Expected length of stay: 14-18 days Cultural Considerations: none Dietary Needs: regular mechanical soft diet, meds in puree, thin liquids with no straws Equipment Needs: to be determined Pt/Family Agrees to Admission and willing to participate: Yes Program Orientation Provided  & Reviewed with Pt/Caregiver Including Roles  & Responsibilities: Yes   Patient Condition: I met with patient and his wife at [facility] on 08-07-13 and 08-08-13 to discuss the purpose of acute inpatient rehabilitation. This 68 year old patient was previously very independent working part time, doing yard work for [employer] and [employer] prior to this new diagnosis of Guillain Barre syndrome. Pt is currently requiring minimal to moderate assist with transfers and moderate assist to ambulate 25 with a rolling walker, demonstrating a high steppage gait pattern. Pt's ability to perform self care skills has been compromised with pt needing moderate assistance overall with activities of daily living. In addition, pt currently needs a mechanical soft diet with meds in puree per speech recommendations. Pt will benefit from further skilled speech services to progress his diet needs and monitor for any progression of Guillain Barre impacting his swallow function. He will benefit greatly from the multi-disciplinary team of skilled PT, OT and rehab nursing to focus on increasing strength for greater independence in transfers, gait, self care skills and ambulation. In addition, rehab nursing can help address medication issues and provide pt/family education on medication issues/bowel/bladder care. Pt will benefit from rehab physician intervention to monitor any progression of Guillain Barre. Discussed case with rehab team and Dr. Naaman Plummer who stated that pt is a good inpatient rehab candidate. We received medical clearance from Dr. Leslye Peer at [facility] and insurance authorization from [facility] for inpatient rehab. Pt and his wife are motivated to come to inpatient rehab and to get pt as independent as possible for home. Pt will benefit from the intensive services of skilled therapy under rehab physician guidance and will be admitted today on 08-11-13.     Preadmission Screen Completed By:   Nanetta Batty PT, 08/11/2013 0918 am ______________________________________________________________________    Discussed status with Dr. Naaman Plummer on 08/11/13 at [phone] and received telephone approval for admission today.   Admission Coordinator:  Nanetta Batty PT, time 0918/Date 08/11/13    Assessment/Plan: Diagnosis: GBS with tetraplegia  Does the need for close, 24 hr/day  Medical supervision in concert with the patient's rehab needs make it unreasonable for this patient to be served in a less intensive setting? Yes Co-Morbidities requiring supervision/potential complications: pain, bowel,bladder Due to bladder management, bowel management, safety, skin/wound care, disease management, medication administration, pain management and patient education, does the patient require 24 hr/day rehab nursing? Yes Does the patient require coordinated care of a physician, rehab nurse, PT (1-2 hrs/day, 5 days/week), OT (1-2 hrs/day, 5 days/week) and SLP (1-2 hrs/day, 5 days/week) to address physical and functional deficits in the context of the above medical diagnosis(es)? Yes Addressing deficits in the following areas: balance, endurance, locomotion, strength, transferring, bowel/bladder control, bathing, dressing, feeding, grooming, toileting, speech, swallowing and psychosocial support Can the patient actively participate in an intensive therapy program of at least 3 hrs of therapy 5 days a week? Yes The potential for patient to make measurable gains while on inpatient rehab is excellent Anticipated functional outcomes upon discharge from inpatients are: supervision and min assist PT, supervision and min assist OT, supervision SLP Estimated rehab length of stay to reach the above functional goals is: 14-18 days Does the patient have adequate social supports to accommodate these discharge functional goals? Yes Anticipated D/C setting: Home Anticipated post D/C treatments: HH therapy and Outpatient  therapy Overall Rehab/Functional Prognosis: excellent       RECOMMENDATIONS: This patient's condition is appropriate for continued rehabilitative care in the following setting: CIR Patient has agreed to participate in recommended program. Yes Note that insurance prior authorization may be required for reimbursement for recommended care.   Comment: Admit to CIR today.    [name], MD, [place], [place] 8/03//2015  Revision History...     Date/Time User Action   08/11/2013 9:40 AM [name], MD Sign   08/11/2013 9:27 AM [place] Details Report

## 2013-08-12 NOTE — Evaluation (Signed)
Speech Language Pathology Assessment and Plan  Patient Details  Name: Brent [name] MRN: [id number] Date of Birth: [birth year]  SLP Diagnosis: Dysarthria;Dysphagia  Rehab Potential: Excellent ELOS: 07/18/13   Today's Date: 08/12/2013 Time: 1330-1430 Time Calculation (min): 60 min  Problem List:  Patient Active Problem List   Diagnosis Date Noted  . 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 Medical history non-contributory    Past Surgical History:  Past Surgical History  Procedure Laterality Date  . Hernia repair    . Lymph node biopsy    . Deep neck lymph node biopsy / excision Right   . Shoulder    . Shoulder arthroscopy Right     Assessment / Plan / Recommendation Clinical Impression Patient is a 68 year old male who was admitted to [facility] on 08/06/13 with abdominal pain with GERD two days prior to sudden onset of numbness bilateral hand and feet as well as right ptosis. He also reported spasms BLE as well as dizziness with standing. Labs done revealed low Na-131 with normal renal status and CT head without acute abnormality. 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 Neurology consulted and recommended LP for work up revealed glucose 66, protein 30, CSF without growth. CSF electrophoresis betaglobulin--21.4, IgA/IgG studies WNL. UDS positive for barbiturates. Random urine porphyrins--coproporphyrin I-56 and coproporphyrin III- 113. Labs negative for HIV 1 and 2, ANA-negative, B 12- 627. Patient treated with IVIG X 5 days for GBS. Pt admitted to CIR on 08/11/13 and was administered a cognitive-linguistic evaluation and a BSE. Pt's overall cognitive-linguistic function appears to be [facility] and pt was 100% intelligible at the conversation level, however, pt  demonstrated decreased vocal intensity. Pt was also given trials of thin liquids, puree and dys. 3 textures and demonstrated a delayed throat clear with his initial bite of puree textures, however, no other overt s/s of aspiration were noted with trials. Recommend patient continue his current diet of Dys. 3 textures with thin liquids. Pt would benefit from skilled SLP intervention to maximize his swallowing and speech function in order to maximize his overall functional independence.   Skilled Therapeutic Interventions          Administered a cognitive-linguistic evaluation and BSE. Please see above for details.   SLP Assessment  Patient will need skilled Speech Lanaguage Pathology Services during CIR admission    Recommendations  Diet Recommendations: Dysphagia 3 (Mechanical Soft);Thin liquid Liquid Administration via: Cup;No straw Medication Administration: Whole meds with puree Supervision: Patient able to self feed Compensations: Slow rate;Small sips/bites Postural Changes and/or Swallow Maneuvers: Upright 30-60 min after meal;Seated upright 90 degrees Oral Care Recommendations: Oral care BID Patient destination: Home Follow up Recommendations: None Equipment Recommended: None recommended by SLP    SLP Frequency 5 out of 7 days   SLP Treatment/Interventions Cueing hierarchy;Environmental controls;Internal/external aids;Functional tasks;Dysphagia/aspiration precaution training;Patient/family education;Therapeutic Activities;Speech/Language facilitation    Pain Pain Assessment Pain Score: 3  Pain Intervention(s): Heat applied  Short Term Goals: Week 1: SLP Short Term Goal 1 (Week 1): Pt will consume his current diet and utilize his swallowing compensatory strategies with minimal overt s/s of aspiration with Mod I .  SLP Short Term Goal 2 (Week 1): Pt will demonstrate efficient mastication with trials of regular textures with Mod I.  SLP Short Term Goal 3 (Week 1): Pt will utilize an  increased vocal intensity at the phrase level with Mod I.   See FIM for current functional status Refer to Care Plan for Long Term Goals  Recommendations for other services: None  Discharge Criteria: Patient will be discharged from SLP if patient refuses treatment 3 consecutive times without medical reason, if treatment goals not met, if there is a change in medical status, if patient makes no progress towards goals or if patient is discharged from hospital.  The above assessment, treatment plan, treatment alternatives and goals were discussed and mutually agreed upon: by patient  Brent [name] 08/12/2013, 3:29 PM

## 2013-08-12 NOTE — Progress Notes (Signed)
INITIAL NUTRITION ASSESSMENT  Pt meets criteria for SEVERE MALNUTRITION in the context of acute illness/injury as evidenced by a 9% weight loss in one week, moderate fat loss, severe muscle depletion, and consuming </= 50% energy intake for >/= 5 days.  DOCUMENTATION CODES Per approved criteria  -Severe malnutrition in the context of acute illness or injury -Underweight   INTERVENTION: Resource Breeze po BID between meals, each supplement provides 250 kcal and 9 grams of protein.  Will continue to monitor.  NUTRITION DIAGNOSIS: Malnutrition related to illness as evidenced by 9% weight loss in one week, moderate fat loss, severe muscle depletion, and consuming </= 50% energy intake for >/= 5 days.   Goal: Pt to meet >/= 90% of their estimated nutrition needs.  Monitor:  PO intake, supplement acceptance, weight trends, labs, I/O's  Reason for Assessment: MST  68 y.o. male  Admitting Dx: Rosalee Kaufman Syndrome  ASSESSMENT: Pt with PMH significant only for a recent emergency room visit for abdominal pain (which was attributed to reflux disease). Presented with bilateral hand and foot numbness with right eye ptosis. Pt was diagnosed with Guillain Barre syndrome.  Spoke with pt. Pt reports he currently has a good appetite and has been eating most of his meals. Meal completion 75%. Pt however reports last week and the week before that he did have a decreased appetite along with eating less (half of his meals). Pt contributes this to his illness and his treatment for his Guillain Barre Syndrome. Pt reports he has lost weight over the past 2 weeks with his usual body weight of 126-130 lbs, which he reports he last weighed 2 weeks ago. Pt has had a 9% weight loss in 1 week, according to Epic records.   Pt reports he currently does not have any problems eating and denies any stomach pains. Pt is willing to try Resource Breeze to help increase his calorie and protein intake. Will order.  Offered Ensure to the pt as well, pt reports he does not want Ensure.  Nutrition Focused Physical Exam:  Subcutaneous Fat:  Orbital Region: N/A Upper Arm Region: WNL Thoracic and Lumbar Region: Moderate depletion  Muscle:  Temple Region: Severe depletion Clavicle Bone Region: Moderate depletion Clavicle and Acromion Bone Region: Moderate depletion Scapular Bone Region: N/A Dorsal Hand: Severe depletion Patellar Region: Severe depletion Anterior Thigh Region: Severe depletion Posterior Calf Region: Severe depletion  Edema: none  Labs:Low sodium and chloride High AST and total billirubin   Height: Ht Readings from Last 1 Encounters:  08/11/13 5\' 9"  (1.753 m)    Weight: Wt Readings from Last 1 Encounters:  08/11/13 117 lb 1 oz (53.1 kg)    Ideal Body Weight: 160 lbs  % Ideal Body Weight: 73%  Wt Readings from Last 10 Encounters:  08/11/13 117 lb 1 oz (53.1 kg)  08/08/13 129 lb 14.4 oz (58.922 kg)    Usual Body Weight: 126-130 lbs  % Usual Body Weight: 90-93%  BMI:  Body mass index is 17.28 kg/(m^2). Underweight  Estimated Nutritional Needs: Kcal: 1800-2000 Protein: 85-95 grams Fluid: 1.8 L- 2L/day  Skin: no issues noted  Diet Order: Dysphagia 3  EDUCATION NEEDS: -No education needs identified at this time   Intake/Output Summary (Last 24 hours) at 08/12/13 0925 Last data filed at 08/12/13 0838  Gross per 24 hour  Intake    360 ml  Output    125 ml  Net    235 ml    Last BM: 8/2  Labs:   Recent Labs Lab 08/12/13 0730  NA 127*  K 4.6  CL 89*  CO2 27  BUN 22  CREATININE 0.67  CALCIUM 9.0  GLUCOSE 88    CBG (last 3)  No results found for this basename: GLUCAP,  in the last 72 hours  Scheduled Meds: . aspirin EC  81 mg Oral Daily  . enoxaparin (LOVENOX) injection  40 mg Subcutaneous Q24H  . nicotine  7 mg Transdermal Daily  . pantoprazole  40 mg Oral Daily    Continuous Infusions:   Past Medical History  Diagnosis Date  .  Medical history non-contributory     Past Surgical History  Procedure Laterality Date  . Hernia repair    . Lymph node biopsy    . Deep neck lymph node biopsy / excision Right   . Shoulder    . Shoulder arthroscopy Right     Kallie Locks, MS, Provisional LDN Pager # [phone] After hours/ weekend pager # [phone]

## 2013-08-12 NOTE — H&P (Signed)
[facility] Admission H&P  CC: Numbness and weakness BLE/Bilateral hands with right ptosis.  HPI: Mr. Brent [name] is a healthy 68 year old male who was admitted to [facility] on 08/06/13 with abdominal pain with GERD two days prior to sudden onset of numbness bilateral hand and feet as well as right ptosis. He also reported spasms BLE as well as dizziness with standing. Labs done revealed low Na-131 with normal renal status and CT head without acute abnormality. 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 Neurology consulted and recommended LP for work up revealed glucose 66, protein 30, CSF without growth. CSF electrophoresis betaglobulin--21.4, IgA/IgG studies WNL. UDS positive for barbiturates. Random urine porphyrins--coproporphyrin I-56 and coproporphyrin III- 113. Labs negative for HIV 1 and 2, ANA-negative, B 12- 627. Patient treated with IVIG X 5 days for GBS.    Review of Systems  Constitutional: Positive for malaise/fatigue.  HENT: Negative for hearing loss.  Eyes: Positive for blurred vision (Colors better in right eye. ) and double vision.  Respiratory: Negative for cough, shortness of breath and wheezing.  Cardiovascular: Negative for chest pain and palpitations.  Gastrointestinal: Positive for constipation. Negative for heartburn, nausea and abdominal pain.  Difficulty swallowing  Genitourinary: Negative for dysuria, urgency and frequency.  Musculoskeletal: Positive for back pain (since LP).  Neurological: Positive for sensory change, focal weakness (LLE) and weakness. Negative for dizziness, tingling and headaches.  Psychiatric/Behavioral: Negative for depression. The patient is not nervous/anxious.   Past Medical History   Diagnosis  Date   .  Medical history non-contributory       Past Surgical History   Procedure  Laterality  Date   .  Hernia repair     .  Lymph node biopsy     .  Deep neck lymph node biopsy / excision  Right    .  Shoulder     .  Shoulder arthroscopy  Right     Family History   Problem  Relation  Age of Onset   .  Parkinson's disease  Other    .  Stroke  Other    .  Diabetes  Sister     Social History: Married. Retired Administrator. Wife works out of home and can provide supervision past discharge. Still works part time--does yard work, drives a truck, Administrator, [employer]. He smokes 1PPD. He does not use smokeless tobacco, alcohol or illicit drugs.  Allergies: No Known Allergies   (Not in a hospital admission)  Home:  2 Level home with 3 STE. Can stay on ground floor.  Functional History:  Independent PTA. Retired 2 years ago.  Functional Status:  Mobility:  Min to moderate assist with transfers  Mod assist to ambulate 24 feet with steppage gait and knee instability  ADL:  Min assist with UB care  Mod assist with LB care.  Mod assit with toilet transfers.  Cognition:    Physical Exam:  There were no vitals taken for this visit.  Physical Exam  Nursing note and vitals reviewed.  Constitutional: He is oriented to person, place, and time.  Thin well developed male. NAD  HENT:  Head: Normocephalic and atraumatic.  Wears upper dentures. Tongue with white coating.  Eyes: Conjunctivae are normal.  Right ptosis with difficulty keeping eyes open. Dysconjugate gaze?  Neck: Normal range  of motion. Neck supple.  Cardiovascular: Normal rate and regular rhythm.  Respiratory: Effort normal and breath sounds normal. No respiratory distress. He has no wheezes.  GI: Soft. Bowel sounds are normal.  Musculoskeletal: He exhibits no edema and no tenderness.  Clubbing bilateral hands  Neurological: He is alert and oriented to person, place, and time. Speech a little dysarthric. Cognition appears to be intact. Sensory changes bilateral hands and  feet to LT and proprioception. Senses pain/pinch.  Left hip flexors 3-/5, 3+/5 knee flexors and plantar flexors, Dorsiflexors 3-/5. RLE 3+/5 HF, KE, except DF 2+/5. UE's  Appear to be 4/5 proximal to distal. Areflexia noted.  Skin: Skin is warm and dry.   No results found for this or any previous visit (from the past 48 hour(s)).  No results found.  Labs: Na-133 K-3.9 Cl- 98 Co2-28 BUN- 14 Cr- 0.9 Hgb-14.4 WBC- 5.8 Plt- 218  Medical Problem List and Plan:  1. 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 DVT Prophylaxis/Anticoagulation: Pharmaceutical: Lovenox  3. Pain Management: Oxycodone prn for back pain.  4. Mood: Will have LCSW follow for evaluation and support.  5. Neuropsych: This patient is capable of making decisions on his own behalf.  6. Skin/Wound Care: Monitor hydration and po intake.  7. Dysphagia: ST to monitor tolerance.   Post Admission Physician Evaluation:  1. 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 Patient is admitted to receive collaborative, interdisciplinary care between the physiatrist, rehab nursing staff, and therapy team. 3. Patient's level of medical complexity and substantial therapy needs in context of that medical necessity cannot be provided at a lesser intensity of care such as a SNF. 4. Patient has experienced substantial functional loss from his/her baseline which was documented above under the "Functional History" and "Functional Status" headings. Judging by the patient's diagnosis, physical exam, and functional history, the patient has potential for functional progress which will result in measurable gains while on inpatient rehab. These gains will be of substantial and practical use upon discharge in facilitating mobility and self-care at the household level. 5. Physiatrist will provide 24 hour management of medical needs as well as oversight of the therapy plan/treatment and provide guidance as appropriate regarding the interaction of the two. 6. 24 hour rehab  nursing will assist with bladder management, bowel management, safety, skin/wound care, disease management, medication administration, pain management and patient education and help integrate therapy concepts, techniques,education, etc. 7. PT will assess and treat for/with: Lower extremity strength, range of motion, stamina, balance, functional mobility, safety, adaptive techniques and equipment, NMR, pain mgt, family and pt education, egosupport. Goals are: mod I. 8. OT will assess and treat for/with: ADL's, functional mobility, safety, upper extremity strength, adaptive techniques and equipment, NMR, family and pt ed, leisure awareness. Goals are: mod I. 9. SLP will assess and treat for/with: swallowing, communication. Goals are: mod I. 10. Case Management and Social Worker will assess and treat for psychological issues and discharge planning. 11. Team conference will be held weekly to assess progress toward goals and to determine barriers to discharge. 12. Patient will receive at least 3 hours of therapy per day at least 5 days per week. 13. ELOS: 10-13 days  14. Prognosis: excellent   [name], MD, [place]

## 2013-08-12 NOTE — Progress Notes (Signed)
Note/chart reviewed.  Katie Avonne Berkery, RD, LDN Pager #: [phone] After-Hours Pager #: [phone]  

## 2013-08-12 NOTE — Progress Notes (Signed)
Social Work Assessment and Plan  Patient Details  Name: Brent [name] MRN: [id number] Date of Birth: [birth year]  Today's Date: 08/12/2013  Problem List:  Patient Active Problem List   Diagnosis Date Noted  . 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 Medical history non-contributory    Past Surgical History:  Past Surgical History  Procedure Laterality Date  . Hernia repair    . Lymph node biopsy    . Deep neck lymph node biopsy / excision Right   . Shoulder    . Shoulder arthroscopy Right    Social History:  reports that he has been smoking Cigarettes.  He has been smoking about 1.00 pack per day. He does not have any smokeless tobacco history on file. He reports that he does not drink alcohol or use illicit drugs.  Family / Support Systems Marital Status: Married How Long?: 15 years Patient Roles: Spouse;Parent;Other (Comment) (part time employee) Spouse/Significant Other: Georgette Dover [phone] (h) [phone] (w) [phone] (m) Children: pt has 3 grown children and wife has 2;  they have 12 grandchildren between the two of them Other Supports: friends, neighbors, church family, co-workers Anticipated Caregiver: wife and dtr Ability/Limitations of Caregiver: wife does work at [employer] in [employer] but can do most of work from home if needed. Wife thought she would have to go into work one day/week and that family can stay with pt then. (Pt's dtr or neighbor) Caregiver Availability: 24/7 Family Dynamics: supportive family, most of whom live near pt and his wife  Social History Preferred language: English Religion: Non-Denominational Read: Yes Write: Yes Employment Status: Retired Date Retired/Disabled/Unemployed: 2013 Age Retired: 65 Public relations account executive Issues: None reported Guardian/Conservator: N/A   Abuse/Neglect Physical Abuse: Denies Verbal Abuse: Denies Sexual Abuse:  Denies Exploitation of patient/patient's resources: Denies Self-Neglect: Denies  Emotional Status Pt's affect, behavior and adjustment status: Pt reports feeling motivated and wants to get well.  He expresses some frustration and disappointment in this happening to him, but is coping well. Recent Psychosocial Issues: None reported Psychiatric History: None reported Substance Abuse History: remote hx of alcoholism, but has been sober for 24 years  Patient / Family Perceptions, Expectations & Goals Pt/Family understanding of illness & functional limitations: Pt understands his condition and limitations and feels the medical team has explained things well to him. 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  He also does a lot of work on his own home. Anticipated changes in roles/activities/participation: Pt wants to get back to doing what he can as soon as he is able.  His stepson is helping him with the rental house.   Other family members to help with his home, as needed.  Pt/family expectations/goals: Pt wants "to regain my strength and continue to motivate through."  [employer]: None Premorbid Home Care/DME Agencies: None Transportation available at discharge: wife  Discharge Planning Living Arrangements: Spouse/significant other Smith: Spouse/significant other;Children;Other relatives;Friends/neighbors;Church/faith community;Other (Comment) (co-workers) Type of Residence: Private residence [employer] Resources: [employer] Resources: [employer];Family Support Financial Screen Referred: No Living Expenses: Mortgage Money Management: Patient;Spouse Does the patient have any problems obtaining your medications?: No Home Management: Pt's wife can manage  this. Patient/Family Preliminary Plans: Pt plans to return to his home with support/assistance from his  wife, dtr, and others as needed. Barriers to Discharge: Steps Expected length of stay: 14-18 days  Clinical Impression CSW met with pt to introduce self and role of CSW, as well as complete assessment.  Pt was open to [place] visit and expressed his motivation to keep moving forward and to get his strength back and return to all of his plans.  Pt realizes that he will need some time to rehabilitate, but is motivated by the things he has "waiting for him" to complete.  Pt reports having good support and knows someone will be with him all the time.  Pt is not used to being still or dependent, so this hospitalization has been difficult for him.  He is glad to be on rehab and feels he will get stronger because he is doing so much more than he was in the acute hospital.  Pt does not have any concerns/questions/needs at this time.  CSW did not call pt's wife due to pt stating he sent her home to rest.  CSW will meet her when she comes to visit pt.  CSW to continue to follow pt and assist as needed.  Mirko Tailor, Silvestre Mesi 08/12/2013, 12:27 PM

## 2013-08-12 NOTE — Progress Notes (Signed)
[facility] Individual Statement of Services  Patient Name:  ORPHEUS HAYHURST  Date:  08/12/2013  Welcome to the [facility].  Our goal is to provide you with an individualized program based on your diagnosis and situation, designed to meet your specific needs.  With this comprehensive rehabilitation program, you will be expected to participate in at least 3 hours of rehabilitation therapies Monday-Friday, with modified therapy programming on the weekends.  Your rehabilitation program will include the following services:  Physical Therapy (PT), Occupational Therapy (OT), Speech Therapy (ST), 24 hour per day rehabilitation nursing, Case Management (Social Worker), Rehabilitation Medicine, Nutrition Services and Pharmacy Services  Weekly team conferences will be held on Tuesdays to discuss your progress.  Your Social Worker will talk with you frequently to get your input and to update you on team discussions.  Team conferences with you and your family in attendance may also be held.  Expected length of stay:  7 days  Overall anticipated outcome:  Modified independent with supervision when walking in the community  Depending on your progress and recovery, your program may change. Your Social Worker will coordinate services and will keep you informed of any changes. Your Social Worker's name and contact numbers are listed  below.  The following services may also be recommended but are not provided by the [facility] will be made to provide these services after discharge if needed.  Arrangements include referral to agencies that provide these services.  Your insurance has been verified to be:  [facility] Medicare Your primary doctor is:  Dr. Enid Derry  Pertinent information will be shared with your doctor and your insurance  company.  Social Worker:  Alfonse Alpers, LCSW  [phone] or [phone]  Information discussed with and copy given to patient by: Trey Sailors, 08/12/2013, 2:51 PM

## 2013-08-12 NOTE — Evaluation (Signed)
Physical Therapy Assessment and Plan  Patient Details  Name: Brent [name] MRN: [id number] Date of Birth: 1945-10-13  PT Diagnosis: Abnormality of gait, Difficulty walking, Impaired sensation, Low back pain and Muscle weakness Rehab Potential: Good ELOS: 7 days   Today's Date: 08/12/2013 Time: 0830-0930 Time Calculation (min): 60 min  Problem List:  Patient Active Problem List   Diagnosis Date Noted  . 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 Medical history non-contributory    Past Surgical History:  Past Surgical History  Procedure Laterality Date  . Hernia repair    . Lymph node biopsy    . Deep neck lymph node biopsy / excision Right   . Shoulder    . Shoulder arthroscopy Right     Assessment & Plan Clinical Impression: This otherwise healthy 68 year old man with past medical history significant only for a recent emergency room visit for abdominal pain (which was attributed to reflux disease) presented to [facility] on 08/06/13 with bilateral hand and foot numbness with right eye ptosis. These symptoms were present upon waking up on the morning of 08-06-13. Pt reports that he was in his usual state of health the day prior. He describes a dense numbness of the hands and feet, up to the wrist and the feet up to the ankles with some aching in the left lower calf. He has no change in vision upon admit but now has complaints of double vision with right eye ptosis remaining. Pt did report some spasticity and change in the motion of both legs. Pt also reports dizziness upon standing and fatigue. CT head was negative for acute changes and MRI brain was also negative for acute changes. 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 MRI lumbar spine was also non-remarkable. Pt with acute onset of polyneuropathy and work up began for Guillain-Barre syndrome.  Lumbar puncture was performed and demonstrated normal protein in CSF. However, per neurologist, pt's clinical pattern follows this disease process and IVIG treatments were initiated (for a total of 5 IVIG treatments). Pt's final IVIG treatment was on 08-09-13. Therapy recommended inpatient rehab be considered and pt is progressing well with therapies though demonstrating a marked decline in his overall function from his very independent baseline.  Patient currently requires min with mobility secondary to muscle weakness and decreased standing balance and decreased sensation.  Prior to hospitalization, patient was independent  with mobility and lived with Spouse in a House home.  Home access is 2Stairs to enter.  Patient will benefit from skilled PT intervention to maximize safe functional mobility, minimize fall risk and decrease caregiver burden for planned discharge home with intermittent assist.  Anticipate patient may  benefit from follow up [place] pending progress at discharge.  PT - End of Session Endurance Deficit: Yes  Skilled Therapeutic Intervention Session focused on assessment and functional mobility. Rolling R/L, supine <> sit all w/ mod (I). Sit<>stand w/ MinA, ambulated 20' w/ RW w/ MinA, limited by fatigue and weakness. Signs of orthostasis, 10-15 point drop in BP w/ supine>sit/stand and reports of some dizziness and SOB. O2 sats normal throughout session. Pt left supine w/ all needs within reach.  PT Evaluation Precautions/Restrictions Precautions Precautions: Fall Restrictions Weight Bearing Restrictions: No General Chart Reviewed: Yes Vital Signs  Pain Pain Assessment Pain Assessment: 0-10 Pain Score: 5  Pain Type: Acute pain Pain Location: Back Pain  Orientation: Lower Pain Descriptors / Indicators: Spasm Pain Onset: On-going Patients Stated Pain Goal: 3 Pain Intervention(s): Heat applied Multiple Pain Sites: Yes 2nd Pain Site Pain Score: 10 Pain Type: Acute  pain Pain Location: Hand Pain Orientation: Left Pain Radiating Towards: thumb, IP joint Pain Descriptors / Indicators: Shooting Pain Frequency: Intermittent Pain Onset: With Activity Home Living/Prior Functioning Home Living Available Help at Discharge: Family;Available 24 hours/day Type of Home: House Home Access: Stairs to enter [employer] of Steps: 2 Entrance Stairs-Rails: Right Home Layout: Two level;Full bath on main level;Able to live on main level with bedroom/bathroom Alternate Level Stairs-Number of Steps: flight of 13 steps to 2nd floor bedrooms Alternate Level Stairs-Rails: Right Additional Comments: Would prefer to stay in bedroom upstairs, willing to stay on ground floor if necessary  Lives With: Spouse Prior Function Level of Independence: Independent with basic ADLs;Independent with homemaking with ambulation  Able to Take Stairs?: Yes Driving: Yes 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 Leisure: Hobbies-yes (Comment) Vision/Perception  Perception Comments: WFL  Cognition Overall Cognitive Status: Within Functional Limits for tasks assessed Arousal/Alertness: Awake/alert Orientation Level: Oriented X4 Attention: Alternating Alternating Attention: Appears intact Memory: Impaired Memory Impairment: Retrieval deficit (word-finding deficits) Awareness: Appears intact Problem Solving: Appears intact Safety/Judgment: Appears intact Sensation Sensation Light Touch: Impaired Detail Light Touch Impaired Details: Impaired RUE;Impaired LUE;Impaired RLE;Impaired LLE Stereognosis: Appears Intact Hot/Cold: Appears Intact Proprioception: Impaired Detail Proprioception Impaired Details: Impaired LUE Additional Comments: decreased sensation in stocking-glove distribution. pt reports sensation is improved in last few days Coordination Gross Motor Movements are Fluid and Coordinated: Yes Fine Motor Movements are Fluid and  Coordinated: No (clumsiness with left hand) Motor  Motor Motor: Within Functional Limits  Mobility Bed Mobility Bed Mobility: Rolling Right;Rolling Left;Sit to Supine;Sitting - Scoot to [employer] of Bed Rolling Right: 6: Modified independent (Device/Increase time) Supine to Sit: 6: Modified independent (Device/Increase time) Sit to Supine: 6: Modified independent (Device/Increase time) Scooting to [facility]: 6: Modified independent (Device/Increase time) Transfers Transfers: Yes Sit to Stand: 4: Min assist Sit to Stand Details: Verbal cues for technique;Verbal cues for precautions/safety Stand to Sit: 4: Min assist Stand to Sit Details (indicate cue type and reason): Verbal cues for technique;Verbal cues for precautions/safety Locomotion     Trunk/Postural Assessment  Cervical Assessment Cervical Assessment: Within Functional Limits Thoracic Assessment Thoracic Assessment: Within Functional Limits Lumbar Assessment Lumbar Assessment: Within Functional Limits Postural Control Postural Control: Within Functional Limits  Balance Balance Balance Assessed: Yes Standardized Balance Assessment Standardized Balance Assessment: Berg Balance Test Extremity Assessment  RUE Assessment RUE Assessment: Within Functional Limits LUE Assessment LUE Assessment: Within Functional Limits RLE Assessment RLE Assessment: Exceptions to [facility] RLE Strength RLE Overall Strength Comments: Overall 4+/5 LLE Assessment LLE Assessment: Exceptions to [facility] LLE Strength LLE Overall Strength Comments: Overall 4+/5 except for HF and KF Left Hip Flexion: 3+/5 Left Knee Flexion: 3+/5  FIM:  FIM - Bed/Chair Transfer Bed/Chair Transfer Assistive Devices: Copy: 6: Supine > Sit: No assist;4: Bed > Chair or W/C: Min A (steadying Pt. > 75%);4: Chair or W/C > Bed: Min A (steadying Pt. > 75%)   Refer to Care Plan for Long Term Goals  Recommendations for other services: None  Discharge Criteria:  Patient will be discharged from PT if patient refuses treatment 3 consecutive times without medical reason, if treatment goals not met, if there is a change in medical status, if patient makes no progress towards goals or if patient is discharged from hospital.  The  above assessment, treatment plan, treatment alternatives and goals were discussed and mutually agreed upon: by patient  Glennon Hamilton, PT, DPT 08/12/2013, 10:24 AM

## 2013-08-13 ENCOUNTER — Inpatient Hospital Stay (HOSPITAL_COMMUNITY): Payer: PRIVATE HEALTH INSURANCE | Admitting: Occupational Therapy

## 2013-08-13 ENCOUNTER — Inpatient Hospital Stay (HOSPITAL_COMMUNITY): Payer: Medicare Other | Admitting: *Deleted

## 2013-08-13 ENCOUNTER — Inpatient Hospital Stay (HOSPITAL_COMMUNITY): Payer: PRIVATE HEALTH INSURANCE | Admitting: Speech Pathology

## 2013-08-13 ENCOUNTER — Inpatient Hospital Stay (HOSPITAL_COMMUNITY): Payer: Medicare Other

## 2013-08-13 DIAGNOSIS — G610 Guillain-Barre syndrome: Secondary | ICD-10-CM

## 2013-08-13 DIAGNOSIS — Z5189 Encounter for other specified aftercare: Secondary | ICD-10-CM

## 2013-08-13 LAB — BASIC METABOLIC PANEL
ANION GAP: 10 (ref 5–15)
BUN: 22 mg/dL (ref 6–23)
CHLORIDE: 91 meq/L — AB (ref 96–112)
CO2: 28 mEq/L (ref 19–32)
Calcium: 8.9 mg/dL (ref 8.4–10.5)
Creatinine, Ser: 0.64 mg/dL (ref 0.50–1.35)
GFR calc non Af Amer: >90 mL/min (ref >90)
Glucose, Bld: 100 mg/dL — ABNORMAL HIGH (ref 70–99)
Potassium: 4.4 mEq/L (ref 3.7–5.3)
Sodium: 129 mEq/L — ABNORMAL LOW (ref 137–147)

## 2013-08-13 NOTE — Progress Notes (Signed)
Physical Therapy Session Note  Patient Details  Name: Brent [name] MRN: [id number] Date of Birth: 05/08/1945  Today's Date: 08/13/2013 Session 1 Time: 1000-1100 Time Calculation (min): 60 min Session 2 Time: 5993-5701 Time Calculation (min) 35 min  Short Term Goals: Week 1:  PT Short Term Goal 1 (Week 1): STG=LTG due to ELOS  Skilled Therapeutic Interventions/Progress Updates:    Session 1: Session focused on wheelchair management, BLE and back stretching, bed mobility, functional ambulation. Received seated in w/c w/ wife present, agreeable to participate in therapy. Wife observed therapy session, asked appropriate questions and provided good support to pt. Propelled 150' w/ S. Pt initially in 20x18 w/c, therapist obtained 16x18 w/c for pt for improved fit and improved ability to self-propel. Pt reported decreased pain in shoulders with smaller w/c. Transfers SPT w/ RW w/ contact guard assist. Bed mobility at mod (I) level. Passive stretching for HS plantarflexors in supine, hip flexors and anterior shoulder musculature in prone. Seated EOB pt rotated trunk R and flexed forward in order to stretch L back extensors. Ambulated 30' w/ RW w/ MinA, noted decreased hip flexion during swing bilateral. Pt primarily limited by fatigue and decreased hip strength. Pt left seated in recliner w/ all needs within reach.  Session 2: Session focused on endurance, hip strengthening, functional ambulation. Received seated in recliner, reported need for bathroom. Ambulated 15' w/ RW w/ MinA to bathroom, one LOB in bathroom 2/2 placing RW too far away requiring ModA to correct. Pt managed clothing w/ steadying assist. Propelled w/c w/ MinA 150' to rehab gym. Completed 6' on Nustep L2 for proximal muscle strengthening. x5 sit<>stands from elevated table w/ no UE support for glute strengthening. Side stepping at [facility] w/ ModA, noted very weak hip abductors during activity. Ambulated 32' w/ RW w/ MinA, very fatigued at  end of session. Pt left in recliner w/ family present w/ all needs within reach.  Therapy Documentation Precautions:  Precautions Precautions: Fall Restrictions Weight Bearing Restrictions: No General:   Vital Signs: Therapy Vitals Temp: 97.6 F (36.4 C) Temp src: Oral Pulse Rate: 84 Resp: 18 BP: 116/68 mmHg Patient Position (if appropriate): Sitting Oxygen Therapy SpO2: 98 % O2 Device: None (Room air) Pain: Intermittent reports of pain in L mid back  See FIM for current functional status  Therapy/Group: Individual Therapy  Rada Hay Rada Hay, PT, DPT 08/13/2013, 7:51 AM

## 2013-08-13 NOTE — Progress Notes (Signed)
Occupational Therapy Session Note  Patient Details  Name: Brent [name] MRN: [id number] Date of Birth: 1945-03-24  Today's Date: 08/13/2013 Time: 0902-1002 Time Calculation (min): 60 min  Short Term Goals: Week 1:  OT Short Term Goal 1 (Week 1): STG=LTG d/t short LOS  Skilled Therapeutic Interventions/Progress Updates:      Pt seen for BADL retraining of toileting, bathing, and dressing with a focus on family education with pt and his wife, balance, activity tolerance.  Pt was in recliner and walked to toilet then into shower with RW with steady A.  He completed all of his self care with close S (except for L shoe, halfway tied laces), but needed several rest breaks.  Pt was fully aware when he needed to rest.  Discussed the importance of energy conservation here and at home, kitchen safety with the sensory loss in his hands, and back stretches as his low back felt tight.  Pt's gait is somewhat unsteady due to LE weakness, but pt had no near LOB.  Pt resting in w/c with wife in the room at the end of session.  Therapy Documentation Precautions:  Precautions Precautions: Fall Restrictions Weight Bearing Restrictions: No      Pain: Pain Assessment Pain Assessment: No/denies pain  ADL: ADL ADL Comments: see FIM  See FIM for current functional status  Therapy/Group: Individual Therapy  [place] 08/13/2013, 11:41 AM

## 2013-08-13 NOTE — Progress Notes (Signed)
Social Work Patient ID: Brent [name], male   DOB: [birth year], 68 y.o.   MRN: [id number]  CSW met with pt and his wife to update them on team conference discussion.  They were pleased to learn pt would be discharged on Monday, 08-18-13, after therapies.  They feel comfortable with this date.  Pt's wife attended therapies today, but can be available for family education, as needed.  Pt will have help at home initially.  No concerns/needs/questions noted at this time.  Pt and wife are appreciative of pt's care.  CSW will continue to follow and assist as needed.

## 2013-08-13 NOTE — Progress Notes (Signed)
[place] PHYSICAL MEDICINE & REHABILITATION     PROGRESS NOTE    Subjective/Complaints: Had a reasonable night. Back is sore. Otherwise doing well.   Objective: Vital Signs: Blood pressure 116/68, pulse 84, temperature 97.6 F (36.4 C), temperature source Oral, resp. rate 18, height 5\' 9"  (1.753 m), weight 53.1 kg (117 lb 1 oz), SpO2 98.00%. No results found.  Recent Labs  08/12/13 0730  WBC 6.7  HGB 14.5  HCT 40.2  PLT 234    Recent Labs  08/12/13 0730 08/13/13 0527  NA 127* 129*  K 4.6 4.4  CL 89* 91*  GLUCOSE 88 100*  BUN 22 22  CREATININE 0.67 0.64  CALCIUM 9.0 8.9   CBG (last 3)  No results found for this basename: GLUCAP,  in the last 72 hours  Wt Readings from Last 3 Encounters:  08/11/13 53.1 kg (117 lb 1 oz)  08/08/13 58.922 kg (129 lb 14.4 oz)    Physical Exam:  Constitutional: He is oriented to person, place, and time.  Thin well developed male. NAD  HENT:  Head: Normocephalic and atraumatic.  Wears upper dentures. Tongue with white coating.  Eyes: Conjunctivae are normal.  Right ptosis, weakness with medial gaze. Some diplopia with gaze to the left. Neck: Normal range of motion. Neck supple.  Cardiovascular: Normal rate and regular rhythm.  Respiratory: Effort normal and breath sounds normal. No respiratory distress. He has no wheezes.  GI: Soft. Bowel sounds are normal.  Musculoskeletal: He exhibits no edema and no tenderness.  Clubbing bilateral hands  Neurological: He is alert and oriented to person, place, and time. Speech a little dysarthric. Cognition appears to be intact. Sensory changes bilateral hands and feet to LT and proprioception. Senses pain/pinch. Left hip flexors 3-/5, 3+/5 knee flexors and plantar flexors, Dorsiflexors 3-/5. RLE 3+/5 HF, KE, except DF 2+/5. UE's Appear to be 4/5 proximal to distal. Areflexia noted.  Skin: Skin is warm and dry.    Assessment/Plan: 1. 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 Physiatrist is providing close team supervision and 24 hour management of active medical problems listed below. Physiatrist and rehab team continue to assess barriers to discharge/monitor patient progress toward functional and medical goals. FIM: FIM - Bathing Bathing Steps Patient Completed: Chest;Right Arm;Left Arm;Abdomen;Front perineal area;Buttocks;Right upper leg;Left upper leg;Right lower leg (including foot);Left lower leg (including foot) Bathing: 5: Supervision: Safety issues/verbal cues  FIM - Upper Body Dressing/Undressing Upper body dressing/undressing: 0: Wears gown/pajamas-no public clothing FIM - Lower Body Dressing/Undressing Lower body dressing/undressing: 0: Wears Interior and spatial designer     FIM - Radio producer Devices: Mining engineer Transfers: 4-To toilet/BSC: Min A (steadying Pt. > 75%);4-From toilet/BSC: Min A (steadying Pt. > 75%)  FIM - Bed/Chair Transfer Bed/Chair Transfer Assistive Devices: Copy: 5: Supine > Sit: Supervision (verbal cues/safety issues);5: Sit > Supine: Supervision (verbal cues/safety issues);4: Bed > Chair or W/C: Min A (steadying Pt. > 75%);4: Chair or W/C > Bed: Min A (steadying Pt. > 75%)  FIM - Locomotion: Wheelchair Locomotion: Wheelchair: 0: Activity did not occur FIM - Locomotion: Ambulation Locomotion: Ambulation Assistive Devices: Administrator Ambulation/Gait Assistance: 4: Min assist Locomotion: Ambulation: 1: Travels less than 50 ft with minimal assistance (Pt.>75%)  Comprehension Comprehension Mode: Auditory Comprehension: 5-Follows basic conversation/direction: With no assist  Expression Expression Mode: Verbal Expression: 5-Expresses basic 90% of the time/requires cueing < 10% of  the time.  Social Interaction Social Interaction: 5-Interacts appropriately 90% of the  time - Needs monitoring or encouragement for participation or interaction.  Problem Solving Problem Solving: 5-Solves basic 90% of the time/requires cueing < 10% of the time  Memory Memory: 5-Recognizes or recalls 90% of the time/requires cueing < 10% of the time  Medical Problem List and Plan:  1. 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 DVT Prophylaxis/Anticoagulation: Pharmaceutical: Lovenox  3. Pain Management: Oxycodone prn for back pain.  4. Mood: Will have LCSW follow for evaluation and support.  5. Neuropsych: This patient is capable of making decisions on his own behalf.  6. Skin/Wound Care: Monitor hydration and po intake.  7. Dysphagia: ST to monitor tolerance---doing well   LOS (Days) 2 A FACE TO FACE EVALUATION WAS PERFORMED  Brent [name] T 08/13/2013 9:03 AM

## 2013-08-13 NOTE — Progress Notes (Signed)
(  Late entry for 08/11/13.)    Admission skin assessment performed by Elliot Cousin, CRRN,WTA.  Skin C.D.I.. No redness, no s/s infection. Continue Nursing Care Plan as initiated.

## 2013-08-13 NOTE — IPOC Note (Signed)
Overall Plan of Care [facility]) Patient Details Name: Brent [name] MRN: [id number] DOB: 26-Jun-1945  Admitting Diagnosis: [facility] Problems: Active Problems:   Guillain Barr syndrome     Functional Problem List: Nursing Endurance;Motor;Pain  PT    OT Balance;Endurance;Pain;Safety;Vision  SLP    TR         Basic ADL's: OT Bathing;Dressing;Toileting     Advanced  ADL's: OT Simple Meal Preparation;Light Housekeeping     Transfers: PT Bed to Chair;Bed Mobility;Car;Furniture;Floor  OT Toilet;Tub/Shower     Locomotion: PT Ambulation;Wheelchair Mobility;Stairs     Additional Impairments: OT Other (comment)  SLP Swallowing;Communication expression    TR      Anticipated Outcomes Item Anticipated Outcome  Self Feeding Independent  Swallowing  Mod I   Basic self-care  Mod I  Toileting  Mod I   Bathroom Transfers Mod I  Bowel/Bladder  Continent of bowel and bladder.  Transfers  mod (I)  Locomotion  mod (I) for household ambulation, S for community ambulation  Communication  Mod I  Cognition     Pain  </=3  Safety/Judgment  No falls with injury, call for assist appropriately, mod I   Therapy Plan: PT Intensity: Minimum of 1-2 x/day ,45 to 90 minutes PT Frequency: 5 out of 7 days PT Duration Estimated Length of Stay: 7 days OT Intensity: Minimum of 1-2 x/day, 45 to 90 minutes OT Frequency: 5 out of 7 days OT Duration/Estimated Length of Stay: 5-7 days SLP Intensity: Minumum of 1-2 x/day, 30 to 90 minutes SLP Frequency: 5 out of 7 days SLP Duration/Estimated Length of Stay: 07/18/13       Team Interventions: Nursing Interventions Patient/Family Education;Pain Management;Psychosocial Support;Bladder Management;Bowel Management;Disease Management/Prevention;Medication Management;Skin Care/Wound Management  PT interventions Ambulation/gait training;Balance/vestibular training;Community reintegration;Discharge planning;Disease  management/prevention;DME/adaptive equipment instruction;Neuromuscular re-education;Functional mobility training;Pain management;Patient/family education;Stair training;Therapeutic Activities;Therapeutic Exercise;UE/LE Strength taining/ROM  OT Interventions Balance/vestibular training;Discharge planning;DME/adaptive equipment instruction;Patient/family education;Therapeutic Activities;Therapeutic Exercise;UE/LE Coordination activities;UE/LE Strength taining/ROM  SLP Interventions Cueing hierarchy;Environmental controls;Internal/external aids;Functional tasks;Dysphagia/aspiration precaution training;Patient/family education;Therapeutic Activities;Speech/Language facilitation  TR Interventions    SW/CM Interventions Discharge Planning;Psychosocial Support;Patient/Family Education    Team Discharge Planning: Destination: PT-Home ,OT- Home , SLP-Home Projected Follow-up: PT-Home health PT (pending progress), OT-  Home health OT, SLP-None Projected Equipment Needs: PT-To be determined, OT- To be determined, SLP-None recommended by SLP Equipment Details: PT- , OT-  Patient/family involved in discharge planning: PT- Patient,  OT-Patient, SLP-Patient  MD ELOS: 7 days Medical Rehab Prognosis:  Excellent Assessment: The patient has been admitted for CIR therapies with the diagnosis of GBS. The team will be addressing functional mobility, strength, stamina, balance, safety, adaptive techniques and equipment, self-care, bowel and bladder mgt, patient and caregiver education, NMR, pain control, egosupport, community reintegration. Goals have been set at mod I for mobility, self-care, and speech/swallowing.    [name], MD, FAAPMR      See Team Conference Notes for weekly updates to the plan of care

## 2013-08-13 NOTE — Patient Care Conference (Signed)
Inpatient RehabilitationTeam Conference and Plan of Care Update Date: 08/12/2013   Time: 2:45 PM    Patient Name: Brent [name]      Medical Record Number: [id number]  Date of Birth: 1945-03-29 Sex: Male         Room/Bed: 4W10C/4W10C-01 Payor Info: Payor: /    Admitting Diagnosis: Rosalee Kaufman  Admit Date/Time:  08/11/2013  3:44 PM Admission Comments: No comment available   Primary Diagnosis:  <principal problem not specified> Principal Problem: <principal problem not specified>  Patient Active Problem List   Diagnosis Date Noted  . 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 Alger Simons Social Worker Present: Alfonse Alpers, LCSW Nurse Present: Elliot Cousin, RN PT Present: Rada Hay, PT OT Present: Salome Spotted, Artemio Aly, OT PPS Coordinator present : Ileana Ladd, Lelan Pons, RN, CRRN     Current Status/Progress Goal Weekly Team Focus  Medical   GBS, pain improved as well as neurological symptoms, D3 diet  improve activity tolerance  pain control, education   Bowel/Bladder   Cont. bladder and bowel, LBM 08/09/13  To remain cont. Bladder and bowel  Monitor for constipation   Swallow/Nutrition/ Hydration   Dys. 3 textures with thin liquids, supervision cues  Mod I  trials of upgraded textures   ADL's   Min Assist for BADL and transfers  Overall Mod I for BADL, Supervision for iADL  Strengthening, coordination, compensatory strategies training, dynamic standing balance, AE/DME training, safety awareness   Mobility   MinGuard A for transfers and short distance ambulation, mod (I) for bed mobility  mod (I) for home ambulation, S for community mobility  endurance, BLE strength, activity tolerance   Communication   supervision for increased vocal intensity  Mod I  utilization of increased vocal intensity    Safety/Cognition/  Behavioral Observations  At baseline  N/A  N/A   Pain   C/o pain in back area, Oxy IR 10mg  q4hrs given, Robaxin 500mg  q6hrs given, Kpad ordered for discomfort   Pain <3  Assess for and treat pain as needed with ordered pain meds    Skin   No skin breakdown  No skin breakdown  Monitor skin for any breakdown     Rehab Goals Patient on target to meet rehab goals: Yes Rehab Goals Revised: None - this is pt's first conference *See Care Plan and progress notes for long and short-term goals.  Barriers to Discharge: none identified--should meet goals    Possible Resolutions to Barriers:  mod I goals within the home    Discharge Planning/Teaching Needs:  Pt plans to return to his home with assistance from his wife and dtr and others, as needed.  Pt's wife can attend family education as needed.  She is present for therapies at times.   Team Discussion:  Pt is doing pretty well.  Still with some weakness and neuropathy (ankles and hands).  Pt continues to have some pain, but the medication is helping.  ST is working on swallowing, but this is improving, also.  Pt's goals are modified independent and supervision for ambulation in the community.  Revisions to Treatment Plan:  None   Continued Need for Acute Rehabilitation Level of Care: The patient requires daily medical management by a physician with specialized training in physical medicine and rehabilitation for the following conditions: Daily direction of a multidisciplinary physical rehabilitation program to ensure safe treatment while eliciting  the highest outcome that is of practical value to the patient.: Yes Daily medical management of patient stability for increased activity during participation in an intensive rehabilitation regime.: Yes Daily analysis of laboratory values and/or radiology reports with any subsequent need for medication adjustment of medical intervention for : Neurological problems;Other  Nayquan Evinger, Silvestre Mesi 08/13/2013, 12:51 PM

## 2013-08-13 NOTE — Progress Notes (Signed)
Recreational Therapy Session Note  Patient Details  Name: Brent [name] MRN: [id number] Date of Birth: October 22, 1945 Today's Date: 08/13/2013  Pain: no c/o Skilled Therapeutic Interventions/Progress Updates: met with pt & discussed TR services.  ELOS ~7 days, no formal eval/treatment plan implemented.  Will monitor through team for community reintegration if appropriate prior to discharge.  [place] 08/13/2013, 5:23 PM

## 2013-08-13 NOTE — Progress Notes (Signed)
Speech Language Pathology Daily Session Note  Patient Details  Name: Brent [name] MRN: [id number] Date of Birth: January 25, 1945  Today's Date: 08/13/2013 Time: 1130-1200 Time Calculation (min): 30 min  Short Term Goals: Week 1: SLP Short Term Goal 1 (Week 1): Pt will consume his current diet and utilize his swallowing compensatory strategies with minimal overt s/s of aspiration with Mod I .  SLP Short Term Goal 2 (Week 1): Pt will demonstrate efficient mastication with trials of regular textures with Mod I.  SLP Short Term Goal 3 (Week 1): Pt will utilize an increased vocal intensity at the phrase level with Mod I.   Skilled Therapeutic Interventions: Skilled treatment session focused on dysphagia goals. SLP facilitated session by providing skilled observation for tolerance with current diet of Dys. 3 textures with thin liquids. Pt demonstrated efficient mastication of Dys. 3 textures and consumed thin liquids via straw without overt s/s of aspiration. Suspect patient can upgrade to regular textures, however, recommend a trial tray of regular textures before upgrade. Continue with current plan of care.    FIM:  Comprehension Comprehension: 6-Follows complex conversation/direction: With extra time/assistive device Expression Expression Mode: Verbal Expression: 6-Expresses complex ideas: With extra time/assistive device Social Interaction Social Interaction: 6-Interacts appropriately with others with medication or extra time (anti-anxiety, antidepressant). Problem Solving Problem Solving: 6-Solves complex problems: With extra time Memory Memory: 6-More than reasonable amt of time FIM - Eating Eating Activity: 5: Set-up assist for open containers  Pain Pain Assessment Pain Assessment: No/denies pain  Therapy/Group: Individual Therapy  Dejanae Helser, [place] 08/13/2013, 3:26 PM

## 2013-08-14 ENCOUNTER — Inpatient Hospital Stay (HOSPITAL_COMMUNITY): Payer: PRIVATE HEALTH INSURANCE

## 2013-08-14 ENCOUNTER — Ambulatory Visit (HOSPITAL_COMMUNITY): Payer: Medicare Other | Admitting: Speech Pathology

## 2013-08-14 ENCOUNTER — Inpatient Hospital Stay (HOSPITAL_COMMUNITY): Payer: PRIVATE HEALTH INSURANCE | Admitting: Physical Therapy

## 2013-08-14 NOTE — Progress Notes (Signed)
Speech Language Pathology Discharge Summary  Patient Details  Name: Brent [name] MRN: [id number] Date of Birth: 01-Feb-1945  Today's Date: 08/14/2013 Time: 1116-1201 Time Calculation (min): 45 min  Skilled Therapeutic Interventions:  Pt was seen for skilled speech therapy targeting dysphagia goals.  SLP completed skilled observations with a trial meal tray of regular textures with thin liquids.  Pt exhibited grossly WFL oropharyngeal swallowing function with no overt s/s of aspiration with solid or liquid consistencies as well as timely manipulation and complete oral clearance of solids.  Furthermore, pt was observed with trials of thin liquids via straw sips with no overt s/s of aspiration.  Pt was not observed with administration of medications with liquids to assess for upgrade as he was not due for scheduled or PRN medications at time of session; however, given that pt's overall swallowing function is [facility], suspect that pt would be appropriate to take meds with liquids as tolerated.  SLP completed skilled education with pt and pt's wife related to risk of aspiration in the setting of Guillain-Barre syndrome and periods of progressively worsening weakness, s/s of aspiration, and universal swallow precautions to minimize overt s/s of aspiration (slow rate, small bites/sips, upright as possible for PO).    Patient has met 3 of 3 long term goals.  Patient to discharge at overall Modified Independent level.  Reasons goals not met: n/a   Clinical Impression/Discharge Summary:  Pt made functional gains and is discharging from speech therapy having met 3 out of 3 long term goals.  Pt is currently modified independent for use of swallowing precautions to minimize overt s/s of aspiration with regular solids and thin liquids.  Additionally, pt is modified independent for use of compensatory strategies to improve vocal intensity for speech intelligibility at the conversational level.  Pt and family education  is complete at this time.  No further SLP needs are indicated.     Care Partner:  Caregiver Able to Provide Assistance: Yes   Recommendation:  None   Equipment: none recommended by SLP    Reasons for discharge: Treatment goals met   Patient/Family Agrees with Progress Made and Goals Achieved: Yes   See FIM for current functional status  [name], M.A. CCC-SLP  Brent [name], Brent [name] 08/14/2013, 4:18 PM

## 2013-08-14 NOTE — Progress Notes (Signed)
Physical Therapy Session Note  Patient Details  Name: Brent [name] MRN: [id number] Date of Birth: 12-13-1945  Today's Date: 08/14/2013 Time: 0930-1015 Session 2: [id number] Time Calculation (min): 45 min Session 2: 30 min  Short Term Goals: Week 1:  PT Short Term Goal 1 (Week 1): STG=LTG due to ELOS  Skilled Therapeutic Interventions/Progress Updates:    Gait Training: PT instructs pt in ambulation with RW x 150' req close SBA and verbal cues to squeeze bottom cheeks together when in stance phase to increase hip stability - pt reports it feels like it helps and PT notes slight increase in stability, but continued B trendellenburg.   Therapeutic Activity: PT instructs pt in sit to stand from recliner, req SBA, RW, and verbal cues for hand placement. Pt req SBA for toilet transfer with RW - pt self manages clothes and hygiene, req SBA for safety. Pt req set up assist to clean and insert dentures. PT instructs pt in w/c to/from recliner chair transfer req SBA with RW.   Therapeutic Exercise: PT instructs pt in mat level hip strengthening: bridges and clam shells x 10 reps each.   Session 2: Therapeutic Exercise: PT instructs pt in eccentric hip abduction exercises in bed x 10 reps to B LE - pt very weak and unable to hold leg in the air after PT places it there.   Therapeutic Activity: PT instructs pt in sit to stand with RW and stand-step transfer recliner to bed req verbal cues for technique. Pt demonstrates mod I sit to/from supine transfer. Pt demonstrates ability to roll mod I in bed without rails R and L. PT instructs pt in repeated sit to stand from edge of bed without AD x 10 reps req CGA for safety and verbal cues for technique.    Pt demonstrates a significant compensated gait. Pt shows steppage gait, B trend[place]burg, and initially holds RW too far away from him, but corrects this with verbal cues. Pt fatigues rapidly and req multiple rest breaks in sitting throughout therapy  session. Pt will benefit from continued proximal musculature strengthening and endurance training/activity tolerance, as well.    Therapy Documentation Precautions:  Precautions Precautions: Fall Restrictions Weight Bearing Restrictions: No       Pain: Pain Assessment Pain Assessment: 0-10 Pain Score: 6  Pain Type: Chronic pain Pain Location: Back Pain Orientation: Mid;Left Pain Onset: With Activity Pain Intervention(s): Rest;Repositioned Multiple Pain Sites: No Session 2: Pt c/o 9/10 pain in mid-back at start of PT and called RN, asking for pain meds.      See FIM for current functional status  Therapy/Group: Individual Therapy  Almina Schul M 08/14/2013, 9:34 AM

## 2013-08-14 NOTE — Progress Notes (Signed)
Occupational Therapy Session Note  Patient Details  Name: Brent [name] MRN: [id number] Date of Birth: 04/16/1945  Today's Date: 08/14/2013 Time: 0727-0827 Time Calculation (min): 60 min  Short Term Goals: Week 1:  OT Short Term Goal 1 (Week 1): STG=LTG d/t short LOS  Skilled Therapeutic Interventions/Progress Updates: ADL-retraining with focus on pain management, functional mobility using RW, endurance, and effective use of DME/AE.    Pt received supine in bed reporting increased low back pain but was receptive for planned ADL session, shower level bathing and dressing.   Pt was able to complete bed mobility and transfer out of bed with supervision using DME and AE but required min guard assist during functional mobility due to unsteady gait.   Pt completed shower-level ADL with only setup assist to provide supplies and then ambulated back to edge of bed to recover due to fatigue.    Pt continued to report back pain and was educated on use of massage wand and manual massage with 10 min of massage provided to reduce symptoms of pain.   Pt stated that he has 2 electric massage wands/devices at home and his wife provides massage as well as topical analgesic (aspercream) when he requests after overworking.   At end of session pt recovered to his recliner and requested assist with setup of his shiatsu massage pad from home, with call light and phone placed within reach.     Therapy Documentation Precautions:  Precautions Precautions: Fall Restrictions Weight Bearing Restrictions: No  Pain: Pain Assessment Pain Assessment: 0-10 Pain Score: 9  Pain Type: Acute pain Pain Location: Back Pain Orientation: Lower Pain Intervention(s): Shower;Distraction;RN made aware;Massage  ADL: ADL ADL Comments: see FIM  See FIM for current functional status  Therapy/Group: Individual Therapy  [place] 08/14/2013, 8:30 AM

## 2013-08-14 NOTE — Progress Notes (Signed)
[facility] PHYSICAL MEDICINE & REHABILITATION     PROGRESS NOTE    Subjective/Complaints: Neck a little sore as well as back. Otherwise doing well. Has a good appetite. No issues swallowing current diet A  review of systems has been performed and if not noted above is otherwise negative.    Objective: Vital Signs: Blood pressure 113/60, pulse 109, temperature 98.4 F (36.9 C), temperature source Oral, resp. rate 19, height 5\' 9"  (1.753 m), weight 53.1 kg (117 lb 1 oz), SpO2 100.00%. No results found.  Recent Labs  08/12/13 0730  WBC 6.7  HGB 14.5  HCT 40.2  PLT 234    Recent Labs  08/12/13 0730 08/13/13 0527  NA 127* 129*  K 4.6 4.4  CL 89* 91*  GLUCOSE 88 100*  BUN 22 22  CREATININE 0.67 0.64  CALCIUM 9.0 8.9   CBG (last 3)  No results found for this basename: GLUCAP,  in the last 72 hours  Wt Readings from Last 3 Encounters:  08/11/13 53.1 kg (117 lb 1 oz)  08/08/13 58.922 kg (129 lb 14.4 oz)    Physical Exam:  Constitutional: He is oriented to person, place, and time.  Thin well developed male. NAD  HENT:  Head: Normocephalic and atraumatic.  Wears upper dentures. Tongue with white coating.  Eyes: Conjunctivae are normal.  Right ptosis, weakness with medial gaze. Some diplopia with gaze to the left. Neck: Normal range of motion. Neck supple.  Cardiovascular: Normal rate and regular rhythm.  Respiratory: Effort normal and breath sounds normal. No respiratory distress. He has no wheezes.  GI: Soft. Bowel sounds are normal.  Musculoskeletal: He exhibits no edema and no tenderness.  Clubbing bilateral hands  Neurological: He is alert and oriented to person, place, and time. Speech a little dysarthric. Cognition appears to be intact. Sensory changes bilateral hands and feet to LT and proprioception. Senses pain/pinch. Left hip flexors 3-/5, 3+/5 knee flexors and plantar flexors, Dorsiflexors 3-/5. RLE 3+/5 HF, KE, except DF 2+/5. UE's Appear to be 4/5  proximal to distal. Areflexia noted.  Skin: Skin is warm and dry.    Assessment/Plan: 1. 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 Physiatrist is providing close team supervision and 24 hour management of active medical problems listed below. Physiatrist and rehab team continue to assess barriers to discharge/monitor patient progress toward functional and medical goals. FIM: FIM - Bathing Bathing Steps Patient Completed: Chest;Right Arm;Left Arm;Abdomen;Front perineal area;Buttocks;Right upper leg;Left upper leg;Right lower leg (including foot);Left lower leg (including foot) Bathing: 6: More than reasonable amount of time  FIM - Upper Body Dressing/Undressing Upper body dressing/undressing steps patient completed: Pull shirt around back of front closure shirt/dress Upper body dressing/undressing: 5: Supervision: Safety issues/verbal cues FIM - Lower Body Dressing/Undressing Lower body dressing/undressing steps patient completed: Thread/unthread right underwear leg;Thread/unthread left underwear leg;Pull underwear up/down;Thread/unthread right pants leg;Thread/unthread left pants leg;Pull pants up/down;Fasten/unfasten pants;Don/Doff right sock;Don/Doff left sock;Don/Doff right shoe;Don/Doff left shoe;Fasten/unfasten right shoe;Fasten/unfasten left shoe Lower body dressing/undressing: 5: Set-up assist to: Obtain clothing  FIM - Toileting Toileting steps completed by patient: Adjust clothing prior to toileting;Performs perineal hygiene;Adjust clothing after toileting Toileting: 5: Supervision: Safety issues/verbal cues  FIM - Radio producer Devices: Walker;Grab bars Toilet Transfers: 4-To toilet/BSC: Min A (steadying Pt. > 75%);4-From toilet/BSC: Min A (steadying Pt. > 75%)  FIM - Bed/Chair Transfer Bed/Chair Transfer Assistive Devices: Walker;HOB elevated;Bed  rails Bed/Chair Transfer:  6: Supine > Sit: No assist;6: Sit > Supine: No assist;5: Bed > Chair or W/C: Supervision (verbal cues/safety issues);5: Chair or W/C > Bed: Supervision (verbal cues/safety issues)  FIM - Locomotion: Wheelchair Locomotion: Wheelchair: 5: Travels 150 ft or more: maneuvers on rugs and over door sills with supervision, cueing or coaxing FIM - Locomotion: Ambulation Locomotion: Ambulation Assistive Devices: Administrator Ambulation/Gait Assistance: 4: Min assist Locomotion: Ambulation: 2: Travels 50 - 149 ft with minimal assistance (Pt.>75%)  Comprehension Comprehension Mode: Auditory Comprehension: 6-Follows complex conversation/direction: With extra time/assistive device  Expression Expression Mode: Verbal Expression: 6-Expresses complex ideas: With extra time/assistive device  Social Interaction Social Interaction: 6-Interacts appropriately with others with medication or extra time (anti-anxiety, antidepressant).  Problem Solving Problem Solving: 6-Solves complex problems: With extra time  Memory Memory: 6-More than reasonable amt of time  Medical Problem List and Plan:  1. 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 DVT Prophylaxis/Anticoagulation: Pharmaceutical: Lovenox  3. Pain Management: Oxycodone prn for back pain.  4. Mood: Will have LCSW follow for evaluation and support.  5. Neuropsych: This patient is capable of making decisions on his own behalf.  6. Skin/Wound Care: Monitor hydration and po intake.  7. Dysphagia: ST to monitor tolerance---doing well   LOS (Days) 3 A FACE TO FACE EVALUATION WAS PERFORMED  SWARTZ,ZACHARY T 08/14/2013 9:39 AM

## 2013-08-15 ENCOUNTER — Inpatient Hospital Stay (HOSPITAL_COMMUNITY): Payer: PRIVATE HEALTH INSURANCE | Admitting: Physical Therapy

## 2013-08-15 ENCOUNTER — Encounter (HOSPITAL_COMMUNITY): Payer: Medicare Other | Admitting: Occupational Therapy

## 2013-08-15 ENCOUNTER — Inpatient Hospital Stay (HOSPITAL_COMMUNITY): Payer: PRIVATE HEALTH INSURANCE

## 2013-08-15 MED ORDER — CHLORPROMAZINE HCL 10 MG PO TABS
10.0000 mg | ORAL_TABLET | Freq: Three times a day (TID) | ORAL | Status: DC | PRN
  Administered 2013-08-15: 10 mg via ORAL
  Filled 2013-08-15: qty 1

## 2013-08-15 NOTE — Progress Notes (Signed)
Social Work Discharge Note  The overall goal for the admission was met for:   Discharge location: Yes - home  Length of Stay: Yes -7 days  Discharge activity level: Yes - modified independent  Home/community participation: Yes  Services provided included: MD, RD, PT, OT, SLP, RN, Pharmacy and SW  Financial Services: Private Insurance: [employer] Medicare  Follow-up services arranged: Home Health: PT and OT via [facility]  DME: 16x18 lightweight wheelchair with basic cushion; rolling walker; shower seat via [facility] and Patient/Family has no preference for HH/DME agencies  Comments (or additional information):  Patient/Family verbalized understanding of follow-up arrangements: Yes  Individual responsible for coordination of the follow-up plan: pt and his wife  Confirmed correct DME delivered: Prevatt, Jennifer Capps 08/15/2013    Prevatt, Jennifer Capps 

## 2013-08-15 NOTE — Plan of Care (Signed)
Problem: RH PAIN MANAGEMENT Goal: RH STG PAIN MANAGED AT OR BELOW PT'S PAIN GOAL Pain rated @ <4/10  Outcome: Not Progressing Reports pain as 7-9/10

## 2013-08-15 NOTE — Progress Notes (Signed)
Occupational Therapy Session Note  Patient Details  Name: Brent [name] MRN: [id number] Date of Birth: [birth year]  Today's Date: 08/15/2013 Time: 1100-1200 Time Calculation (min): 60 min  Short Term Goals: Week 1:  OT Short Term Goal 1 (Week 1): STG=LTG d/t short LOS  Skilled Therapeutic Interventions/Progress Updates:  Patient received seated in recliner with no complaints of pain. Patient eager to work with therapist and shower this am. Patient stood with RW and ambulated from recliner > BR for toilet transfer, toileting, then shower stall transfer onto tub transfer bench. UB/LB bathing completed in sit<>stand position with close supervision using grab bars to steady self. Patient ambulated back to recliner for UB/LB dressing in sit<>stand position after shower. Therapist provided steady assist during functional mobility/ambulation due to patient's decrease in dynamic standing balance. After dressing, patient with increased pain (8/10). Patient transferred > prone on bed and therapist used "Magic Wand" (vibrator/massager) on patient as a pain intervention and patient's pain decreased > 6/10. At end of session, left patient prone on bed with Kpad on back and all needs within reach.   Precautions:  Precautions Precautions: Fall Restrictions Weight Bearing Restrictions: No  See FIM for current functional status  Therapy/Group: Individual Therapy  Sharonda Llamas 08/15/2013, 12:06 PM

## 2013-08-15 NOTE — Progress Notes (Signed)
[facility] PHYSICAL MEDICINE & REHABILITATION     PROGRESS NOTE    Subjective/Complaints: Working through neck/back pain. Making progress as a whole. A  review of systems has been performed and if not noted above is otherwise negative.    Objective: Vital Signs: Blood pressure 118/67, pulse 99, temperature 98 F (36.7 C), temperature source Oral, resp. rate 19, height 5\' 9"  (1.753 m), weight 53.1 kg (117 lb 1 oz), SpO2 100.00%. No results found. No results found for this basename: WBC, HGB, HCT, PLT,  in the last 72 hours  Recent Labs  08/13/13 0527  NA 129*  K 4.4  CL 91*  GLUCOSE 100*  BUN 22  CREATININE 0.64  CALCIUM 8.9   CBG (last 3)  No results found for this basename: GLUCAP,  in the last 72 hours  Wt Readings from Last 3 Encounters:  08/11/13 53.1 kg (117 lb 1 oz)  08/08/13 58.922 kg (129 lb 14.4 oz)    Physical Exam:  Constitutional: He is oriented to person, place, and time.  Thin well developed male. NAD  HENT:  Head: Normocephalic and atraumatic.  Wears upper dentures. Tongue with white coating.  Eyes: Conjunctivae are normal.  Right ptosis, weakness with medial gaze. Some diplopia with gaze to the left. Neck: Normal range of motion. Neck supple.  Cardiovascular: Normal rate and regular rhythm.  Respiratory: Effort normal and breath sounds normal. No respiratory distress. He has no wheezes.  GI: Soft. Bowel sounds are normal.  Musculoskeletal: He exhibits no edema and no tenderness.  Clubbing bilateral hands  Neurological: He is alert and oriented to person, place, and time. Speech a little dysarthric. Cognition appears to be intact. Sensory changes bilateral hands and feet to LT and proprioception. Senses pain/pinch. Left hip flexors 3-/5, 3+/5 knee flexors and plantar flexors, Dorsiflexors 3-/5. RLE 3+/5 HF, KE, except DF 2+/5. UE's Appear to be 4/5 proximal to distal. Areflexia noted.  Skin: Skin is warm and dry.    Assessment/Plan: 1.  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 Physiatrist is providing close team supervision and 24 hour management of active medical problems listed below. Physiatrist and rehab team continue to assess barriers to discharge/monitor patient progress toward functional and medical goals. FIM: FIM - Bathing Bathing Steps Patient Completed: Chest;Right Arm;Left Arm;Abdomen;Front perineal area;Buttocks;Right upper leg;Left upper leg;Right lower leg (including foot);Left lower leg (including foot) Bathing: 6: More than reasonable amount of time  FIM - Upper Body Dressing/Undressing Upper body dressing/undressing steps patient completed: Pull shirt around back of front closure shirt/dress Upper body dressing/undressing: 5: Supervision: Safety issues/verbal cues FIM - Lower Body Dressing/Undressing Lower body dressing/undressing steps patient completed: Don/Doff left sock;Don/Doff right sock Lower body dressing/undressing: 5: Set-up assist to: Obtain clothing  FIM - Toileting Toileting steps completed by patient: Adjust clothing prior to toileting;Performs perineal hygiene;Adjust clothing after toileting Toileting Assistive Devices: Grab bar or rail for support Toileting: 5: Supervision: Safety issues/verbal cues  FIM - Radio producer Devices: Mining engineer Transfers: 5-From toilet/BSC: Supervision (verbal cues/safety issues)  FIM - Control and instrumentation engineer Devices: Environmental consultant;Arm rests Bed/Chair Transfer: 5: Bed > Chair or W/C: Supervision (verbal cues/safety issues);5: Chair or W/C > Bed: Supervision (verbal cues/safety issues)  FIM - Locomotion: Wheelchair Locomotion: Wheelchair: 0: Activity did not occur FIM - Locomotion: Ambulation Locomotion: Ambulation Assistive Devices: Administrator Ambulation/Gait Assistance: 5:  Supervision Locomotion: Ambulation: 5: [employer]  150 ft or more with supervision/safety issues  Comprehension Comprehension Mode: Auditory Comprehension: 6-Follows complex conversation/direction: With extra time/assistive device  Expression Expression Mode: Verbal Expression: 6-Expresses complex ideas: With extra time/assistive device  Social Interaction Social Interaction: 6-Interacts appropriately with others with medication or extra time (anti-anxiety, antidepressant).  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 DVT Prophylaxis/Anticoagulation: Pharmaceutical: Lovenox  3. Pain Management: Oxycodone prn for back pain.  4. Mood: Will have LCSW follow for evaluation and support.  5. Neuropsych: This patient is capable of making decisions on his own behalf.  6. Skin/Wound Care: Monitor hydration and po intake.  7. Dysphagia: ST to monitor tolerance---doing well 8. Hyponatremia: gradually improving  -encourage appropriate intake  -recheck monday   LOS (Days) 4 A FACE TO FACE EVALUATION WAS PERFORMED  SWARTZ,ZACHARY T 08/15/2013 9:49 AM

## 2013-08-15 NOTE — Progress Notes (Signed)
Physical Therapy Session Note  Patient Details  Name: Brent [name] MRN: [id number] Date of Birth: Jun 26, 1945  Today's Date: 08/15/2013 Time: 0930-1030 Session 2: 1300-1400 Time Calculation (min): 60 min Session 2: 60 minutes  Short Term Goals: Week 1:  PT Short Term Goal 1 (Week 1): STG=LTG due to ELOS  Skilled Therapeutic Interventions/Progress Updates:    Therapeutic Activity: Pt req SBA to transfer off of toilet with RW and grab bar. Pt self manages clothing and hygiene. Pt is able to turn around and flush toilet and transfer to w/c with RW req SBA. Pt completes brushing teeth and dentures management mod I from w/c level.   Gait Training: PT instructs pt in ambulation with RW x 110' req SBA and verbal cues to squeeze buttocks, for safe walker distance, and to relax shoulders. Pt continues to demonstrate B trend[place]burg gait: R > L, compensation with heavy UE use including shoulder hiking, and general instability from hip weakness. PT instructs pt stair training on 5 wooden practice stairs with B rails req CGA in step-to pattern with verbal cues for technique and hand placement.   Neuromuscular Reeducation: PT instructs pt in standing dynamic balance exercises: marching in stand req CGA x 10 reps each, reaching to touch large cones placed on ground x 10 reps forward req CGA and x 5 reps to R/L req CGA. Pt reports side to side felt more difficult than forward reaching, which makes sense since pt's hip abductors are significantly more weak than hip extenders.   W/C Management:  PT instructs pt in w/c propulsion with B UE x 150' req verbal cues for UE usage to make w/c go in a straight line. PT instructs pt in legrest management req verbal cues to don/doff legrests, including flipping up footplate and swinging away legrests.   Session 2: Therapeutic Activity: Pt received sitting edge of bed - lunch not yet eaten. PT instructs pt in transfer to sink with RW req SBA so pt can wash his  hands before eating. Pt req cues for safe walker use - will step too far away from walker if not given verbal cues. PT instructs pt in safe toilet transfer technique with RW and grab bar req SBA and verbal cues for safe walker distance and to squeeze bottom cheeks when ambulating. Pt demonstrates sit to supine transfer in bed independent.   Neuromuscular REeducation: PT instructs pt in dynamic standing balance activity: standing while eating lunch on tray req SBA due to impaired balance and fatigue. Pt requires seated rest breaks due to fatigue while eating. PT instructs pt in alternating eating while standing and eating while sitting.   Gait Training: PT instructs pt in safe ambulation with RW x 20' req SBA and verbal cues to squeeze buttocks, depress shoulders, and keep walker at a safe distance. Pt reports he feels nauseous and so PT instructs him to sit. Pt feels he drank his water too fast and that when he did that a few days ago he vomited everything up.   W/C Management:  PT measures pt and notes he needs a 16" wide and 18" deep manual w/c due to having a very tall, thin body frame. W/C needs to be standard height (18-19" floor to seat height) due to his difficulty standing up. Pt will require a Brent [name] Basic w/c cushion that is 16" wide and 18" deep. Pt's limited activity tolerance and extremely low endurance necessitate the need for the w/c.    Pt is a Scientist, research (physical sciences),  but continues to have low activity tolerance and benefits from multiple rest breaks throughout session. Hip weakness is the primary limiting factor in pt's safety with ambulation and pt presents with a compensated gait including heavy reliance on B UEs for safety. Pt will benefit from continued strengthening and standing balance education, as well as safety with gait, transfers, and stair training, and w/c management education.   Therapy Documentation Precautions:  Precautions Precautions: Fall Restrictions Weight Bearing  Restrictions: No    Pain: Pain Assessment Pain Assessment: 0-10 Pain Score: 10-Worst pain ever Faces Pain Scale: Hurts little more Pain Type: Chronic pain Pain Location: Back Pain Orientation: Mid Pain Descriptors / Indicators: Aching;Sore Pain Frequency: Constant Pain Onset: With Activity Pain Intervention(s): Rest;Emotional support Multiple Pain Sites: No Session 2: Pt denies pain.   See FIM for current functional status  Therapy/Group: Individual Therapy  Brent [name] 08/15/2013, 9:36 AM

## 2013-08-16 ENCOUNTER — Inpatient Hospital Stay (HOSPITAL_COMMUNITY): Payer: Medicare Other | Admitting: Physical Therapy

## 2013-08-16 ENCOUNTER — Inpatient Hospital Stay (HOSPITAL_COMMUNITY): Payer: PRIVATE HEALTH INSURANCE | Admitting: Occupational Therapy

## 2013-08-16 DIAGNOSIS — Z5189 Encounter for other specified aftercare: Principal | ICD-10-CM

## 2013-08-16 DIAGNOSIS — G610 Guillain-Barre syndrome: Secondary | ICD-10-CM

## 2013-08-16 NOTE — Progress Notes (Signed)
Pt requested pain medication. This nurse pulled 10 mg of the oxycodone. Pt only received 5 mg because as the nurse was given the patient his medication in his hand one pill fell out of the patients hand and was lost in the bed.

## 2013-08-16 NOTE — Progress Notes (Signed)
Brent [name] is a 68 y.o. male 1945/04/17 [id number]  Subjective: Nausea but no vomiting. No abdominal pain - Constipation resolved with laxative early this AM. No new problems.   Objective: Vital signs in last 24 hours: Temp:  [98 F (36.7 C)-98.6 F (37 C)] 98.6 F (37 C) (08/08 0539) Pulse Rate:  [96-103] 101 (08/08 0539) Resp:  [18-20] 18 (08/08 0539) BP: (123-135)/(56-73) 124/63 mmHg (08/08 0539) SpO2:  [99 %-100 %] 99 % (08/08 0539) Weight change:  Last BM Date: 08/13/13  Intake/Output from previous day: 08/07 0701 - 08/08 0700 In: 1080 [P.O.:1080] Out: -   Physical Exam General: No apparent distress   Sitting in WC, in hall with PT Lungs: Normal effort. Lungs clear to auscultation, no crackles or wheezes. Cardiovascular: Regular rate and rhythm, no edema   Lab Results: BMET    Component Value Date/Time   NA 129* 08/13/2013 0527   K 4.4 08/13/2013 0527   CL 91* 08/13/2013 0527   CO2 28 08/13/2013 0527   GLUCOSE 100* 08/13/2013 0527   BUN 22 08/13/2013 0527   CREATININE 0.64 08/13/2013 0527   CALCIUM 8.9 08/13/2013 0527   GFRNONAA >90 08/13/2013 0527   GFRAA >90 08/13/2013 0527   CBC    Component Value Date/Time   WBC 6.7 08/12/2013 0730   RBC 4.51 08/12/2013 0730   HGB 14.5 08/12/2013 0730   HCT 40.2 08/12/2013 0730   PLT 234 08/12/2013 0730   MCV 89.1 08/12/2013 0730   MCH 32.2 08/12/2013 0730   MCHC 36.1* 08/12/2013 0730   RDW 12.7 08/12/2013 0730   LYMPHSABS 1.8 08/12/2013 0730   MONOABS 1.6* 08/12/2013 0730   EOSABS 0.1 08/12/2013 0730   BASOSABS 0.1 08/12/2013 0730   CBG's (last 3):  No results found for this basename: GLUCAP,  in the last 72 hours LFT's Lab Results  Component Value Date   ALT 25 08/12/2013   AST 44* 08/12/2013   ALKPHOS 75 08/12/2013   BILITOT 1.7* 08/12/2013    Studies/Results: Dg Lumbar Spine 2-3 Views  08/15/2013   CLINICAL DATA:  Low back pain  EXAM: LUMBAR SPINE - 2-3 VIEW  COMPARISON:  Lumbar MRI October 05, 2013  FINDINGS: Frontal, lateral, and spot  lumbosacral lateral images were obtained. There are 5 non-rib-bearing lumbar type vertebral bodies. There is slight levoscoliosis. There is no fracture or spondylolisthesis. There is mild disc space narrowing at L3-4. There is moderate disc space narrowing at L5-S1. No erosive change. There are foci of vascular calcification in the aorta and iliac arteries.  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 Mild scoliosis. No fracture or spondylolisthesis. There is atherosclerotic calcification.   Electronically Signed   By: Lowella Grip M.D.   On: 08/15/2013 15:37    Medications:  I have reviewed the patient's current medications. Scheduled Medications: . aspirin EC  81 mg Oral Daily  . enoxaparin (LOVENOX) injection  40 mg Subcutaneous Q24H  . feeding supplement (RESOURCE BREEZE)  1 Container Oral BID BM  . nicotine  7 mg Transdermal Daily  . pantoprazole  40 mg Oral Daily   PRN Medications: acetaminophen, alum & mag hydroxide-simeth, bisacodyl, chlorproMAZINE, guaiFENesin-dextromethorphan, methocarbamol, oxyCODONE, polyethylene glycol, prochlorperazine, prochlorperazine, prochlorperazine, traZODone  Assessment/Plan: Active Problems:   Guillain Barr syndrome  1. 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 DVT Prophylaxis/Anticoagulation: Pharmaceutical: Lovenox  3. Pain Management: Oxycodone prn for back pain. Aware of  constipation side effects of oxy 4. Mood: Will have LCSW follow for evaluation and support.  5. Neuropsych: This patient is capable of making decisions on his own behalf.  6. Skin/Wound Care: Monitor hydration and po intake.  7. Dysphagia: ST to monitor tolerance---doing well  8. Hyponatremia: gradually improving  -encourage appropriate intake  -recheck monday  Length of stay, days: 5    Valerie A. Asa Lente, MD 08/16/2013, 10:01 AM

## 2013-08-16 NOTE — Progress Notes (Signed)
Physical Therapy Session Note  Patient Details  Name: Brent [name] MRN: [id number] Date of Birth: 05/04/1945  Today's Date: 08/16/2013 Time: 0900-1000 Tx 2: 1430-1500 Time Calculation (min): 60 min Tx 2: 30 minutes  Short Term Goals: Week 1:  PT Short Term Goal 1 (Week 1): STG=LTG due to ELOS  Skilled Therapeutic Interventions/Progress Updates:    Therapeutic Activity: PT instructs pt in toilet transfer with RW req SBA for safety; pt manages clothes and hygiene. Pt washes hands at sink with RW req SBA for safety after using the bathroom. PT instructs pt in safe transfer bed to w/c with RW req SBA and verbal cues for safe walker use. Pt demonstrates mod I sit to/from supine on mat.   Gait Training: PT instructs pt in ambulation with RW x 50' + 60' + 70' req seated rest breaks in between and SBA when ambulating with verbal cues for keeping the walker close, shoulder depression, and squeezing the bottom cheeks to increase stability. Pt demonstrates decreased [place] and decreased postural sway compared to previous day.   Therapeutic Exercise: PT instructs pt in bridges, eccentric side lie hip abduction, SLR, prone press ups, quadruped cat-camel x 10 reps to each LE.   Pt continues to have fatigue and back pain, and hip weakness, which are his primary limiting factors in functional independence. PT set pt up on mat level home program so pt can continue to address these impairments safely at d/c.   Session 2:  Therapeutic Activity: PT instructs pt in toilet transfer with RW req SBA. PT instructs pt not to use grab bar since he does not have a grab bar at home. Pt self manages pants and hygiene. Pt dons shoes when PT brings them to him and pt ties them, as well.   Gait Training: PT instructs pt in ascending/descending 4 stairs with 1 rail (both hands on the rail) in sideways pattern req close SBA and verbal cues for technique. PT suggests to pt that he have someone install a 2nd  rail at his home for ease of entrance. PT also instructs pt that someone will need to be with pt when he enters/exits the home in order to bring his RW in/out of the home.   Pt continues to be limited by back pain. PT instructs pt to use shiatsu pillow to help relieve pain. Pt will benefit from continued strengthening proximally, standing balance reeducation, and progressive gait and stair training.    Therapy Documentation Precautions:  Precautions Precautions: Fall Restrictions Weight Bearing Restrictions: No     Pain: Pain Assessment Pain Assessment: 0-10 Pain Score: 6  Pain Type: Chronic pain Pain Location: Back Pain Orientation: Mid Pain Onset: On-going Pain Intervention(s): Repositioned Multiple Pain Sites: No Session 2: Pt c/o 7/10 pain in mid-back and PT utilizes rest and pt's shiatsu massage pillow to help pain calm down.     See FIM for current functional status  Therapy/Group: Individual Therapy  Waylynn Benefiel M 08/16/2013, 9:09 AM

## 2013-08-16 NOTE — Progress Notes (Signed)
Occupational Therapy Session Note  Patient Details  Name: Brent [name] MRN: [id number] Date of Birth: [birth year]  Today's Date: 08/16/2013  First Session: Time: 1100-1200 Time calculation in minutes = 60 Patient c/o of only sleeping 3 hours last night because "indigestion" but walked bed to toilet and then shower via RW and  participated in room shower via tub transfer bench.  Due to fatigue and low back pain, he elected to wash with lateral leans rather than stand in shower today.   Biggest challenge was working through pain and fatigue this am.   Second Session: Time:  1300-1330 = 30 minutes Patient participated in bilateral fine motor coordination and lying prone on his bed while this clinician helped manually and with vibrating "Magic Barnabas Lister" to stretch his back, glut, and hamstring muscles in an effort to decrease his back pain      Therapy Documentation Precautions:  Precautions Precautions: Fall Restrictions Weight Bearing Restrictions: No  Pain: did not rate but "constant jab across my middle back."    RN had previously given pain meds    See FIM for current functional status  Therapy/Group: Individual Therapy  Alfredia Ferguson [facility] 08/16/2013, 2:28 PM

## 2013-08-17 ENCOUNTER — Inpatient Hospital Stay (HOSPITAL_COMMUNITY): Payer: PRIVATE HEALTH INSURANCE | Admitting: Occupational Therapy

## 2013-08-17 ENCOUNTER — Inpatient Hospital Stay (HOSPITAL_COMMUNITY): Payer: PRIVATE HEALTH INSURANCE | Admitting: Physical Therapy

## 2013-08-17 NOTE — Progress Notes (Signed)
Physical Therapy Session Note  Patient Details  Name: Brent [name] MRN: [id number] Date of Birth: Dec 03, 1945  Today's Date: 08/17/2013 Time: 0900-1000 and 1440-1510 Time Calculation (min): 60 min and 30 min  Short Term Goals: Week 1:  PT Short Term Goal 1 (Week 1): STG=LTG due to ELOS  Skilled Therapeutic Interventions/Progress Updates:   AM Session: Pt received sitting in recliner, requesting to use bathrooom. Pt at overall supervision level for mobility.  Pt ambulated to bathroom and to sink to wash hands using RW. W/c mobility using BUEs x 150 ft, mod I with extra time. Stair negotiation up/down 5 steps using R rail and ascending laterally x 3, min verbal cues for sequencing stepping with outside foot vs crossing over LEs. Pt educated on safety with falls and when to call EMS vs attempt to get up on own, verbalized understanding. Pt performed floor transfer x 2 with initial supervision for sequencing progressed to mod I. In supine with B LEs in hooklying, pt performed lower trunk rotation and bridging with report of "some" improvement in back pain. Gait training x 70 ft and x 80 ft using RW and supervision, 1 seated rest break to return to room. Patient reporting 6-7/10 back pain at beginning of session and 7/10 pain at end of session. Pt left sitting in recliner with all needs within reach.   PM Session: Pt received asleep in recliner, easily aroused but remaining drowsy for beginning of session. Upon standing to ambulate to bathroom using RW, pt with 1 LOB due to decreased alertness but able to recover independently. Pt reporting 5/10 back pain. Pt ambulated using RW to toilet and sink to wash hands, supervision. Gait training room > dayroom > outside therapy gym for ambulation in controlled and home environments using RW and supervision, requires 2 seated rest breaks. With UE suppprt on rail in hallway, pt performed sidestepping and retro gait to fatigue for hip abduction and posterior chain  strengthening. Pt returned to room in w/c and requesting to use bathroom. Pt left seated on toilet, verbalized calling for assistance when ready to return to recliner.   Therapy Documentation Precautions:  Precautions Precautions: Fall Restrictions Weight Bearing Restrictions: No Pain: Pain Assessment Pain Assessment: 0-10 Pain Score: 7  Pain Type: Chronic pain Pain Location: Back Pain Orientation: Mid Repositioned, increased activity/ambulation  See FIM for current functional status  Therapy/Group: Individual Therapy  Laretta Alstrom 08/17/2013, 12:38 PM

## 2013-08-17 NOTE — Progress Notes (Signed)
Occupational Therapy Session Note  Patient Details  Name: Brent [name] MRN: [id number] Date of Birth: 14-Jun-1945  Today's Date: 08/17/2013 Time: 1300-1330 Time Calculation (min): 30 min  Skilled Therapeutic Interventions/Progress Updates: Patient received skilled OT services to address bilateral hand digital fine motor coordination, strength and dexterity.   With time and though he stated, his fingers were a little sore while trying to complete, he was able to dig out small items from medium soft theraputty.   This back pain level was down to 7/10 as compared to "15+/10" from this morning.    He also completed toileting and toilet transfer via RW with distant S.   He said the covering MD today told him she would talk with him further regarding his xray results from Friday.    Therapy Documentation Precautions:  Precautions Precautions: Fall Restrictions Weight Bearing Restrictions: No Pain:7/10 RN had just given pain meds    See FIM for current functional status  Therapy/Group: Individual Therapy  Alfredia Ferguson [facility] 08/17/2013, 1:17 PM

## 2013-08-17 NOTE — Progress Notes (Signed)
Brent [name] is a 68 y.o. male Jul 24, 1945 [id number]  Subjective: Pain not controlled at night - ?related to meds or positioning - poor sleep because of same.   Objective: Vital signs in last 24 hours: Temp:  [97.5 F (36.4 C)-98.8 F (37.1 C)] 98.1 F (36.7 C) (08/09 0554) Pulse Rate:  [99-105] 101 (08/09 0554) Resp:  [14-18] 18 (08/09 0554) BP: (115-124)/(53-69) 115/61 mmHg (08/09 0554) SpO2:  [99 %-100 %] 99 % (08/09 0554) Weight change:  Last BM Date: 08/13/13  Intake/Output from previous day: 08/08 0701 - 08/09 0700 In: 720 [P.O.:720] Out: -   Physical Exam General: No apparent distress   Sitting in [facility] Lungs: Normal effort. Lungs clear to auscultation, no crackles or wheezes. Cardiovascular: Regular rate and rhythm, no edema   Lab Results: BMET    Component Value Date/Time   NA 129* 08/13/2013 0527   K 4.4 08/13/2013 0527   CL 91* 08/13/2013 0527   CO2 28 08/13/2013 0527   GLUCOSE 100* 08/13/2013 0527   BUN 22 08/13/2013 0527   CREATININE 0.64 08/13/2013 0527   CALCIUM 8.9 08/13/2013 0527   GFRNONAA >90 08/13/2013 0527   GFRAA >90 08/13/2013 0527   CBC    Component Value Date/Time   WBC 6.7 08/12/2013 0730   RBC 4.51 08/12/2013 0730   HGB 14.5 08/12/2013 0730   HCT 40.2 08/12/2013 0730   PLT 234 08/12/2013 0730   MCV 89.1 08/12/2013 0730   MCH 32.2 08/12/2013 0730   MCHC 36.1* 08/12/2013 0730   RDW 12.7 08/12/2013 0730   LYMPHSABS 1.8 08/12/2013 0730   MONOABS 1.6* 08/12/2013 0730   EOSABS 0.1 08/12/2013 0730   BASOSABS 0.1 08/12/2013 0730   CBG's (last 3):  No results found for this basename: GLUCAP,  in the last 72 hours LFT's Lab Results  Component Value Date   ALT 25 08/12/2013   AST 44* 08/12/2013   ALKPHOS 75 08/12/2013   BILITOT 1.7* 08/12/2013    Studies/Results: Dg Lumbar Spine 2-3 Views  08/15/2013   CLINICAL DATA:  Low back pain  EXAM: LUMBAR SPINE - 2-3 VIEW  COMPARISON:  Lumbar MRI October 05, 2013  FINDINGS: Frontal, lateral, and spot lumbosacral lateral images were  obtained. There are 5 non-rib-bearing lumbar type vertebral bodies. There is slight levoscoliosis. There is no fracture or spondylolisthesis. There is mild disc space narrowing at L3-4. There is moderate disc space narrowing at L5-S1. No erosive change. There are foci of vascular calcification in the aorta and iliac arteries.  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 Mild scoliosis. No fracture or spondylolisthesis. There is atherosclerotic calcification.   Electronically Signed   By: Lowella Grip M.D.   On: 08/15/2013 15:37    Medications:  I have reviewed the patient's current medications. Scheduled Medications: . aspirin EC  81 mg Oral Daily  . enoxaparin (LOVENOX) injection  40 mg Subcutaneous Q24H  . feeding supplement (RESOURCE BREEZE)  1 Container Oral BID BM  . nicotine  7 mg Transdermal Daily  . pantoprazole  40 mg Oral Daily   PRN Medications: acetaminophen, alum & mag hydroxide-simeth, bisacodyl, chlorproMAZINE, guaiFENesin-dextromethorphan, methocarbamol, oxyCODONE, polyethylene glycol, prochlorperazine, prochlorperazine, prochlorperazine, traZODone  Assessment/Plan: Active Problems:   Guillain Barr syndrome  1. 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 DVT Prophylaxis/Anticoagulation: Pharmaceutical: Lovenox  3. Pain Management: Oxycodone prn for back pain. Aware of constipation side effects of oxy -  recommend scheduled dose qhs 4. Mood: Will have LCSW follow for evaluation and support.  5. Neuropsych: This patient is capable of making decisions on his own behalf.  6. Skin/Wound Care: Monitor hydration and po intake.  7. Dysphagia: ST to monitor tolerance---doing well  8. Hyponatremia: gradually improving  -encourage appropriate intake  -recheck monday  Length of stay, days: 6    Valerie A. Asa Lente, MD 08/17/2013, 8:19 AM

## 2013-08-17 NOTE — Progress Notes (Signed)
Occupational Therapy Session Note  Patient Details  Name: Brent [name] MRN: [id number] Date of Birth: 05-Dec-1945  Today's Date: 08/17/2013 Time: 0730-0815 Time Calculation (min): 45 min   Skilled Therapeutic Interventions/Progress Updates: Scheduled for ADL this morning but in too much pain "15/10 constant grabbing" in his back to do batheing or change clothes.  As well, he stated he only got 1 hour of sleep last night due to pain and constipation.     He did complete toileting and the transfer with distant S via [place].   As well, he required assist to open milk carton and lid off some of his containers due to problems with fine motor coordination and dexterity.     Therapy Documentation Precautions:  Precautions Precautions: Fall Restrictions Weight Bearing Restrictions: No General: General Amount of Missed OT Time (min): 15 Minutes (15 min)  Pain:  RN gave all pain meds but he says it does not help much and that an xray was taken recently but no one told him the results Pain Assessment Pain Assessment: 0-10 Pain Score: 10-Worst pain ever Pain Type: Chronic pain Pain Location: Back Pain Orientation: Mid Pain Descriptors / Indicators: Aching Pain Onset: On-going Pain Intervention(s): Medication (See eMAR)  See FIM for current functional status  Therapy/Group: Individual Therapy  Alfredia Ferguson [facility] 08/17/2013, 8:09 AM

## 2013-08-18 ENCOUNTER — Inpatient Hospital Stay (HOSPITAL_COMMUNITY): Payer: PRIVATE HEALTH INSURANCE

## 2013-08-18 ENCOUNTER — Inpatient Hospital Stay (HOSPITAL_COMMUNITY): Payer: Medicare Other

## 2013-08-18 DIAGNOSIS — E871 Hypo-osmolality and hyponatremia: Secondary | ICD-10-CM | POA: Insufficient documentation

## 2013-08-18 DIAGNOSIS — M549 Dorsalgia, unspecified: Secondary | ICD-10-CM

## 2013-08-18 DIAGNOSIS — G610 Guillain-Barre syndrome: Secondary | ICD-10-CM

## 2013-08-18 DIAGNOSIS — Z5189 Encounter for other specified aftercare: Secondary | ICD-10-CM

## 2013-08-18 LAB — BASIC METABOLIC PANEL
Anion gap: 9 (ref 5–15)
BUN: 12 mg/dL (ref 6–23)
CHLORIDE: 91 meq/L — AB (ref 96–112)
CO2: 27 mEq/L (ref 19–32)
CREATININE: 0.56 mg/dL (ref 0.50–1.35)
Calcium: 8.8 mg/dL (ref 8.4–10.5)
GFR calc non Af Amer: >90 mL/min (ref >90)
Glucose, Bld: 95 mg/dL (ref 70–99)
Potassium: 4.2 mEq/L (ref 3.7–5.3)
Sodium: 127 mEq/L — ABNORMAL LOW (ref 137–147)

## 2013-08-18 MED ORDER — PANTOPRAZOLE SODIUM 40 MG PO TBEC: 40.0000 mg | DELAYED_RELEASE_TABLET | Freq: Every day | ORAL | Status: DC

## 2013-08-18 MED ORDER — NICOTINE 7 MG/24HR TD PT24: 7.0000 mg | MEDICATED_PATCH | Freq: Every day | TRANSDERMAL | Status: DC

## 2013-08-18 MED ORDER — POLYETHYLENE GLYCOL 3350 17 G PO PACK: 17.0000 g | PACK | Freq: Every day | ORAL | Status: DC

## 2013-08-18 MED ORDER — METHOCARBAMOL 500 MG PO TABS: 500.0000 mg | ORAL_TABLET | Freq: Four times a day (QID) | ORAL | Status: DC | PRN

## 2013-08-18 MED ORDER — ASPIRIN 81 MG PO TBEC: 81.0000 mg | DELAYED_RELEASE_TABLET | Freq: Every day | ORAL | Status: AC

## 2013-08-18 MED ORDER — OXYCODONE HCL 10 MG PO TABS: 5.0000 mg | ORAL_TABLET | Freq: Four times a day (QID) | ORAL | Status: DC | PRN

## 2013-08-18 NOTE — Progress Notes (Signed)
Physical Therapy Discharge Summary  Patient Details  Name: Brent [name] MRN: [id number] Date of Birth: 01-27-1945  Today's Date: 08/18/2013 Time: 0900-1000 Time Calculation (min): 60 min  Patient has met 5 of 7 long term goals due to improved activity tolerance, improved balance, improved postural control and increased strength.  Patient to discharge at a wheelchair level Modified Independent, except S for stairs and car transfers.   Patient's care partner is independent to provide the necessary physical assistance at discharge.  Reasons goals not met: Decreased activity tolerance limited ambulation distance, currently requires S for stair negotiation  Recommendation:  Patient will benefit from ongoing skilled PT services in home health setting to continue to advance safe functional mobility, address ongoing impairments in endurance, hip strength, back pain, and minimize fall risk.  Equipment: wheelchair, rolling walker  Reasons for discharge: discharge from hospital  Patient/family agrees with progress made and goals achieved: Yes  Skilled Therapeutic Interventions: Ambulated 150' w/ RW w/ mod (I), very fatigued. Performed Berg Balance Test, pt scored 34/56 indicating high falls risk. Discussed score and interpretation w/ pt, educated that he is at high falls risk if ambulating without assistive device. Pt verbalized understanding. Negotiated up/down 3 stairs then 10 stairs w/ S and 1 rail, min VC's for sequencing and foot placement. Pt agreeable to having supervision assist from wife for stair negotiation initially at discharge. Pt left in recliner w/ all needs within reach.  PT Discharge Precautions/Restrictions Precautions Precautions: Fall Restrictions Weight Bearing Restrictions: No Vital Signs Therapy Vitals Temp: 97.8 F (36.6 C) Temp src: Oral Pulse Rate: 87 Resp: 18 BP: 115/67 mmHg Patient Position (if appropriate): Lying Oxygen Therapy SpO2: 100 % O2 Device:  None (Room air) Pain Pain Assessment Pain Assessment: 0-10 Pain Score: 8  Pain Type: Acute pain Pain Location: Back Pain Orientation: Lower;Left Pain Descriptors / Indicators: Aching Pain Frequency: Intermittent Pain Onset: With Activity Pain Intervention(s):  (Pt pre-medicated prior to session) Vision/Perception  Perception Comments: WFL  Cognition Overall Cognitive Status: Within Functional Limits for tasks assessed Arousal/Alertness: Awake/alert Orientation Level: Oriented X4 Attention: Alternating Alternating Attention: Appears intact Memory: Appears intact Awareness: Appears intact Safety/Judgment: Appears intact Sensation Sensation Light Touch: Impaired Detail Light Touch Impaired Details: Impaired RUE;Impaired LUE;Impaired RLE;Impaired LLE Stereognosis: Appears Intact Hot/Cold: Appears Intact Proprioception: Appears Intact Additional Comments: decreased sensation volar/palmar hands, stocking below ankles Coordination Gross Motor Movements are Fluid and Coordinated: Yes Fine Motor Movements are Fluid and Coordinated: Yes Motor  Motor Motor: Within Functional Limits  Mobility Bed Mobility Bed Mobility: Rolling Right;Rolling Left;Sit to Supine;Sitting - Scoot to [employer] of Bed;Supine to [employer] Right: 6: Modified independent (Device/Increase time) Rolling Left: 6: Modified independent (Device/Increase time) Supine to Sit: 6: Modified independent (Device/Increase time) Sit to Supine: 6: Modified independent (Device/Increase time) Scooting to [facility]: 6: Modified independent (Device/Increase time) Transfers Transfers: Yes Sit to Stand: 6: Modified independent (Device/Increase time) Stand to Sit: 6: Modified independent (Device/Increase time) Locomotion  Ambulation Ambulation: Yes Ambulation/Gait Assistance: 6: Modified independent (Device/Increase time) Ambulation Distance (Feet): 157 Feet Assistive device: Rolling walker Gait Gait: Yes Gait Pattern:  Impaired Gait Pattern: Trendelenburg Stairs / Additional Locomotion Stairs: Yes  Trunk/Postural Assessment  Cervical Assessment Cervical Assessment: Within Functional Limits Thoracic Assessment Thoracic Assessment: Within Functional Limits Lumbar Assessment Lumbar Assessment: Within Functional Limits Postural Control Postural Control: Within Functional Limits  Balance Balance Balance Assessed: Yes Standardized Balance Assessment Standardized Balance Assessment: Berg Balance Test Berg Balance Test Sit to Stand: Able to stand  independently using  hands Standing Unsupported: Able to stand 2 minutes with supervision Sitting with Back Unsupported but Feet Supported on Floor or Stool: Able to sit safely and securely 2 minutes Stand to Sit: Controls descent by using hands Transfers: Able to transfer safely, definite need of hands Standing Unsupported with Eyes Closed: Able to stand 10 seconds with supervision Standing Ubsupported with Feet Together: Able to place feet together independently and stand for 1 minute with supervision From Standing, Reach Forward with Outstretched Arm: Can reach forward >5 cm safely (2") From Standing Position, Pick up Object from Floor: Able to pick up shoe, needs supervision From Standing Position, Turn to Look Behind Over each Shoulder: Turn sideways only but maintains balance Turn 360 Degrees: Able to turn 360 degrees safely but slowly Standing Unsupported, Alternately Place Feet on Step/Stool: Able to complete >2 steps/needs minimal assist Standing Unsupported, One Foot in Front: Able to take small step independently and hold 30 seconds Standing on One Leg: Unable to try or needs assist to prevent fall Total Score: 34 Extremity Assessment  RUE Assessment RUE Assessment: Within Functional Limits LUE Assessment LUE Assessment: Within Functional Limits RLE Assessment RLE Assessment: Exceptions to [facility] RLE Strength RLE Overall Strength Comments: Overall  4+/5 except for HF Right Hip Flexion: 3+/5 LLE Strength LLE Overall Strength Comments: Overall 5/5 except for HF and KF Left Hip Flexion: 3+/5 Left Knee Flexion: 4/5  See FIM for current functional status  Rada Hay 08/18/2013, 9:46 AM

## 2013-08-18 NOTE — Progress Notes (Signed)
Occupational Therapy Session and Discharge Summary  Patient Details  Name: Brent [name] MRN: [id number] Date of Birth: [birth year]  Today's Date: 08/18/2013 Time: 0729-0829 Time Calculation (min): 60 min  Skilled Intervention: ADL-retraining with focus on endurance, pain management, dynamic sitting/standing balance and discharge planning.   Pt confirms need for shower chair in his step-in shower due to impaired endurance with preference to remain seated during bathing.   Pt reports no improvement on low back pain which continues to impair performance in ADL relating to need for extra time and support during mobility.   Pt retains use of RW for functional mobility and uses device skillfully while performing transfers in/out of bed, on/off toilet, and in/out of walk-in shower.  No LOB noted during ADL this session.   Pt benefits from massage wand after activity to reduce muscle spasms and tension at upper back and at gluteus.    Pt completed toileting, bathing at shower level, dressing (at EOB), light grooming and self-feeding at modified independent level with decline in activity tolerance while self-feeding seated in recliner.     Patient has met 8 of 10 long term goals due to improved activity tolerance, ability to compensate for deficits and functional use of  LEFT upper extremity.  Patient to discharge at overall Modified Independent level.  Patient's care partner is independent to provide the necessary physical assistance at discharge to insure adequate access to prepared meals and for temporary delegation or suspense of homemaking tasks.    Reasons goals not met: Meal prep and homemaking tasks deferred due to pt priority to address non-pharm pain management for acute back pain (using massage).  Recommendation:  Patient will benefit from ongoing skilled OT services in home health setting to continue to advance functional skills in the area of iADL.  Equipment: Shower chair  Reasons for  discharge: treatment goals met and discharge from hospital  Patient/family agrees with progress made and goals achieved: Yes  OT Discharge Precautions/Restrictions  Precautions Precautions: Fall Restrictions Weight Bearing Restrictions: No  Vital Signs Therapy Vitals Temp: 97.8 F (36.6 C) Temp src: Oral Pulse Rate: 87 Resp: 18 BP: 115/67 mmHg Patient Position (if appropriate): Lying Oxygen Therapy SpO2: 100 % O2 Device: None (Room air)  Pain Pain Assessment Pain Assessment: 0-10 Pain Score: 8  Pain Type: Acute pain Pain Location: Back Pain Orientation: Lower Pain Frequency: Intermittent Pain Onset: With Activity  ADL ADL ADL Comments: see FIM  Vision/Perception  Vision- History Baseline Vision/History: Wears glasses Wears Glasses: Reading only Patient Visual Report: Blurring of vision Vision- Assessment Vision Assessment?: Vision impaired- to be further tested in functional context Perception Comments: WFL   Cognition Overall Cognitive Status: Within Functional Limits for tasks assessed Arousal/Alertness: Awake/alert Orientation Level: Oriented X4 Attention: Alternating Alternating Attention: Appears intact Memory: Appears intact Awareness: Appears intact Safety/Judgment: Appears intact  Sensation Sensation Light Touch: Impaired Detail Light Touch Impaired Details: Impaired RUE;Impaired LUE;Impaired RLE;Impaired LLE Stereognosis: Appears Intact Hot/Cold: Appears Intact Proprioception: Appears Intact Additional Comments: decreased sensation volar hands, stocking below ankles Coordination Gross Motor Movements are Fluid and Coordinated: Yes Fine Motor Movements are Fluid and Coordinated: Yes  Motor  Motor Motor: Within Functional Limits  Mobility  Bed Mobility Bed Mobility: Rolling Right;Rolling Left;Sit to Supine;Sitting - Scoot to Edge of Bed Rolling Right: 6: Modified independent (Device/Increase time) Supine to Sit: 6: Modified  independent (Device/Increase time) Sit to Supine: 6: Modified independent (Device/Increase time) Scooting to [facility]: 6: Modified independent (Device/Increase time) Transfers Transfers: Sit to Stand;Stand  to Sit Sit to Stand: 6: Modified independent (Device/Increase time) Stand to Sit: 6: Modified independent (Device/Increase time)   Trunk/Postural Assessment  Cervical Assessment Cervical Assessment: Within Functional Limits Thoracic Assessment Thoracic Assessment: Within Functional Limits Lumbar Assessment Lumbar Assessment: Within Functional Limits Postural Control Postural Control: Within Functional Limits   Balance Balance Balance Assessed: Yes  Extremity/Trunk Assessment RUE Assessment RUE Assessment: Within Functional Limits LUE Assessment LUE Assessment: Within Functional Limits  See FIM for current functional status  Karsynn Deweese 08/18/2013, 9:52 AM

## 2013-08-18 NOTE — Discharge Summary (Signed)
Physician Discharge Summary  Patient ID: DEMETRIC DUNNAWAY MRN: [id number] DOB/AGE: [birth year] 68 y.o.  Admit date: 08/11/2013 Discharge date: 08/18/2013  Discharge Diagnoses:  Principal Problem:   Guillain Barr syndrome Active Problems:   Low sodium levels   Back pain without radiation   Discharged Condition: stable   Significant Diagnostic Studies: Dg Lumbar Spine 2-3 Views  08/15/2013   CLINICAL DATA:  Low back pain  EXAM: LUMBAR SPINE - 2-3 VIEW  COMPARISON:  Lumbar MRI October 05, 2013  FINDINGS: Frontal, lateral, and spot lumbosacral lateral images were obtained. There are 5 non-rib-bearing lumbar type vertebral bodies. There is slight levoscoliosis. There is no fracture or spondylolisthesis. There is mild disc space narrowing at L3-4. There is moderate disc space narrowing at L5-S1. No erosive change. There are foci of vascular calcification in the aorta and iliac arteries.  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 Mild scoliosis. No fracture or spondylolisthesis. There is atherosclerotic calcification.   Electronically Signed   By: Lowella Grip M.D.   On: 08/15/2013 15:37    Labs:  Basic Metabolic Panel:  Recent Labs Lab 08/12/13 0730 08/13/13 0527 08/18/13 0543  NA 127* 129* 127*  K 4.6 4.4 4.2  CL 89* 91* 91*  CO2 27 28 27   GLUCOSE 88 100* 95  BUN 22 22 12   CREATININE 0.67 0.64 0.56  CALCIUM 9.0 8.9 8.8    CBC:  Recent Labs Lab 08/12/13 0730  WBC 6.7  NEUTROABS 3.1  HGB 14.5  HCT 40.2  MCV 89.1  PLT 234    CBG: No results found for this basename: GLUCAP,  in the last 168 hours  Brief HPI:   Mr. SALVATOR SEPPALA is a healthy 68 year old male who was admitted to [facility] on 08/06/13 with abdominal pain with GERD two days prior to sudden onset of numbness bilateral hand and feet as well as right ptosis. Labs done revealed low Na-131 and MRI brain negative for acute stroke.  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 Neurology consulted and recommended LP for work up revealed glucose 66, protein 30, CSF without growth. Patient treated with IVIG X 5 days for GBS with improvement. Therapy initiated  CIR was recommended for follow up by Rehab team.    Hospital Course: [name] was admitted to rehab 08/11/2013 for inpatient therapies to consist of PT, ST and OT at least three hours five days a week. Past admission physiatrist, therapy team and rehab RN have worked together to provide customized collaborative inpatient rehab. Blood pressures were monitored on TID basis and have been stable. Hyponatremia has been followed and showed improvement initially. Labs at discharge shows Na-127 and patient was advised on 1500 cc/FR daily as well as liberalizing salt. HHRN to draw lytes on 08/12 with results to PMD. He has continued to have complaints of back pain and follow up Xrays shows no acute changes. Oxycodone is being used on prn basis for lumbar spondylosis likely aggravated by overall in mobility as well as muscle weakness. He has made good gains and is independent at wheelchair level. He will continue to receive HHPT, [place] and HHOT by [facility] past discharge.   Rehab course: During patient's stay in rehab weekly team conferences were held to monitor patient's progress, set goals and discuss barriers to discharge. Patient has had improvement in activity tolerance, balance, postural control, as well as ability  to compensate for deficits. He is has had improvement in functional use LUE  and LLE. He is independent for bathing and dressing tasks. He is independent at wheelchair level. He is independent for ambulating short distances and requires supervision for stair navigation and when in community setting.    Disposition: 01-Home or Self Care  Diet: Regular diet. 1500 cc fluid restriction.   Special Instructions: 1. HHRN to draw BMET on  08/20/13 with results to Dr. Sanda Klein.    Medication List         aspirin 81 MG EC tablet  Take 1 tablet (81 mg total) by mouth daily.     methocarbamol 500 MG tablet  Commonly known as:  ROBAXIN  Take 1 tablet (500 mg total) by mouth every 6 (six) hours as needed for muscle spasms.     nicotine 7 mg/24hr patch  Commonly known as:  NICODERM CQ - dosed in mg/24 hr  Place 1 patch (7 mg total) onto the skin daily.     Oxycodone HCl 10 MG Tabs--Rx 75 pills   Take 0.5-1 tablets (5-10 mg total) by mouth every 6 (six) hours as needed for severe pain.     pantoprazole 40 MG tablet  Commonly known as:  PROTONIX  Take 1 tablet (40 mg total) by mouth daily.     polyethylene glycol packet  Commonly known as:  MIRALAX / GLYCOLAX  Take 17 g by mouth daily.       Follow-up Information   Follow up with [name], MD On 10/01/2013. (Be there at 9:30 for 10 am appointment)    Specialty:  Physical Medicine and Rehabilitation   Contact information:   [address], [address] [place] [address] [phone]       Follow up with Arnetha Courser, MD On 09/03/2013. (@ 8:15 AM)    Specialty:  Family Medicine   Contact information:   [address] [place] [address] [phone]       Signed: Bary Leriche 08/18/2013, 4:04 PM

## 2013-08-18 NOTE — Progress Notes (Signed)
Pt. Got d/c instructions,prescriptions and follow up appointments.Pt. Ready to go home with wife.

## 2013-08-18 NOTE — Plan of Care (Signed)
Problem: RH Ambulation Goal: LTG Patient will ambulate in community environment (PT) LTG: Patient will ambulate in community environment, # of feet with assistance (PT).  Outcome: Not Met (add Reason) Pt at mod (I) level for shorter ambulation, unable to ambulate 300' at once due to decreased endurance.  Problem: RH Stairs Goal: LTG Patient will ambulate up and down stairs w/assist (PT) LTG: Patient will ambulate up and down # of stairs with assistance (PT)  Outcome: Not Met (add Reason) Pt able to negotiate up/down 12 stairs w/ S and min VC's for sequencing. Pt will have 24 hr care at home from wife, discussed having supervision for stair negotiation for first few days after d/c. Pt agreeable to plan.

## 2013-08-18 NOTE — Discharge Instructions (Signed)
Inpatient Rehab Discharge Instructions  Render Brent [name] Discharge date and time:  08/18/13  Activities/Precautions/ Functional Status: Activity: activity as tolerated Diet: regular diet. Limit fluids to 1500 cc/daily (5 cups) Wound Care: none needed  Functional status:  ___ No restrictions     ___ Walk up steps independently ___ 24/7 supervision/assistance   ___ Walk up steps with assistance _X_ Intermittent supervision/assistance  ___ Bathe/dress independently ___ Walk with walker     _X__ Bathe/dress with assistance ___ Walk Independently    ___ Shower independently _X__ Walk with supervision    ___ Shower with assistance _X__ No alcohol     ___ Return to work/school ________  COMMUNITY REFERRALS UPON DISCHARGE:   Home Health:   PT     OT        Agency:  [facility] Phone:  [phone]  Medical Equipment/Items Ordered:  [id number] wheelchair with a basic cushion; rolling walker; shower seat  Agency/Supplier:  [facility]        Phone:  [phone]  Support Group:  [email or web address]                                   [email or web address]  Special Instructions:    My questions have been answered and I understand these instructions. I will adhere to these goals and the provided educational materials after my discharge from the hospital.  Patient/Caregiver Signature _______________________________ Date __________  Clinician Signature _______________________________________ Date __________  Please bring this form and your medication list with you to all your follow-up doctor's appointments.

## 2013-08-18 NOTE — Progress Notes (Signed)
[facility] PHYSICAL MEDICINE & REHABILITATION     PROGRESS NOTE    Subjective/Complaints: Discuss results of lumbar x-ray. Patient's maintain back pain is in the upper lumbar area rather than lower lumbar, no radiating pain down the leg, no new bowel or bladder issues A  review of systems has been performed and if not noted above is otherwise negative.    Objective: Vital Signs: Blood pressure 115/67, pulse 87, temperature 97.8 F (36.6 C), temperature source Oral, resp. rate 18, height 5\' 9"  (1.753 m), weight 53.1 kg (117 lb 1 oz), SpO2 100.00%. No results found. No results found for this basename: WBC, HGB, HCT, PLT,  in the last 72 hours  Recent Labs  08/18/13 0543  NA 127*  K 4.2  CL 91*  GLUCOSE 95  BUN 12  CREATININE 0.56  CALCIUM 8.8   CBG (last 3)  No results found for this basename: GLUCAP,  in the last 72 hours  Wt Readings from Last 3 Encounters:  08/11/13 53.1 kg (117 lb 1 oz)  08/08/13 58.922 kg (129 lb 14.4 oz)    Physical Exam:  Constitutional: He is oriented to person, place, and time.  Thin well developed male. NAD  HENT:  Head: Normocephalic and atraumatic.  Wears upper dentures. Tongue with white coating.  Eyes: Conjunctivae are normal.  Right ptosis, weakness with medial gaze. Some diplopia with gaze to the left. Neck: Normal range of motion. Neck supple.  Cardiovascular: Normal rate and regular rhythm.  Respiratory: Effort normal and breath sounds normal. No respiratory distress. He has no wheezes.  GI: Soft. Bowel sounds are normal.  Musculoskeletal: He exhibits no edema and no tenderness. Back has mild tenderness to palpation in the lumbar paraspinals mainly at L3-L4  Clubbing bilateral hands  Neurological: He is alert and oriented to person, place, and time. Speech a little dysarthric. Cognition appears to be intact. Sensory changes bilateral hands and feet to LT and proprioception. Senses pain/pinch. Left hip flexors 3-/5, 3+/5 knee flexors  and plantar flexors, Dorsiflexors 3-/5. RLE 3+/5 HF, KE, except DF 2+/5. UE's Appear to be 4/5 proximal to distal. Areflexia noted.  Skin: Skin is warm and dry.    Assessment/Plan: 1. 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 Stable for D/C today F/u PCP in 1-2 weeks, F/u Neuro F/u PM&R 3 weeks See D/C summary See D/C instructions FIM: FIM - Bathing Bathing Steps Patient Completed: Chest;Abdomen;Left Arm;Right Arm;Front perineal area;Buttocks;Right upper leg;Left upper leg;Right lower leg (including foot);Left lower leg (including foot) Bathing: 6: More than reasonable amount of time  FIM - Upper Body Dressing/Undressing Upper body dressing/undressing steps patient completed: Thread/unthread right sleeve of pullover shirt/dresss;Thread/unthread left sleeve of pullover shirt/dress;Put head through opening of pull over shirt/dress;Pull shirt over trunk Upper body dressing/undressing: 6: More than reasonable amount of time FIM - Lower Body Dressing/Undressing Lower body dressing/undressing steps patient completed: Thread/unthread right underwear leg;Thread/unthread left underwear leg;Pull underwear up/down;Thread/unthread right pants leg;Thread/unthread left pants leg;Pull pants up/down;Fasten/unfasten pants;Don/Doff right sock;Don/Doff left sock;Don/Doff right shoe;Don/Doff left shoe;Fasten/unfasten right shoe;Fasten/unfasten left shoe Lower body dressing/undressing: 6: More than reasonable amount of time  FIM - Toileting Toileting steps completed by patient: Adjust clothing prior to toileting;Performs perineal hygiene;Adjust clothing after toileting Toileting Assistive Devices: Grab bar or rail for support Toileting: 6: More than reasonable amount of time  FIM - Radio producer Devices: Walker;Grab bars Toilet Transfers: 6-To toilet/ BSC;6-From toilet/BSC  FIM  - Control and instrumentation engineer Devices: Copy: 6: More than reasonable amt of time;6: Sit > Supine: No assist;6: Bed > Chair or W/C: No assist;6: Chair or W/C > Bed: No assist  FIM - Locomotion: Wheelchair Distance: 150 Locomotion: Wheelchair: 5: Travels 150 ft or more: maneuvers on rugs and over door sills with supervision, cueing or coaxing FIM - Locomotion: Ambulation Locomotion: Ambulation Assistive Devices: Administrator Ambulation/Gait Assistance: 6: Modified independent (Device/Increase time) Locomotion: Ambulation: 2: Travels 50 - 149 ft with supervision/safety issues  Comprehension Comprehension Mode: Auditory Comprehension: 6-Follows complex conversation/direction: With extra time/assistive device  Expression Expression Mode: Verbal Expression: 6-Expresses complex ideas: With extra time/assistive device  Social Interaction Social Interaction: 6-Interacts appropriately with others with medication or extra time (anti-anxiety, antidepressant).  Problem Solving Problem Solving: 6-Solves complex problems: With extra time  Memory Memory: 6-More than reasonable amt of time  Medical Problem List and Plan:  1. 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 DVT Prophylaxis/Anticoagulation: Pharmaceutical: Lovenox  3. Pain Management: Oxycodone prn for back pain. Has evidence of lumbar spondylosis which is likely aggravated by overall in mobility as well as muscle weakness 4. Mood: Will have LCSW follow for evaluation and support.  5. Neuropsych: This patient is capable of making decisions on his own behalf.  6. Skin/Wound Care: Monitor hydration and po intake.  7. Dysphagia: ST to monitor tolerance---doing well 8. Hyponatremia: gradually improving  -encourage appropriate intake  -recheck monday   LOS (Days) 7 A FACE TO FACE EVALUATION WAS PERFORMED  Charlett Blake 08/18/2013 9:38 AM

## 2013-08-21 ENCOUNTER — Telehealth: Payer: Self-pay

## 2013-08-21 NOTE — Telephone Encounter (Signed)
Jatanna @ [facility] is requesting a call back regarding patient's Robaxin RX. Contacted pharmacy and they state that the provider from the hospital called in a different medication for the patient due to insurance covering the RX. Contacted Jatanna to inform her of the above.

## 2013-09-02 DIAGNOSIS — R52 Pain, unspecified: Secondary | ICD-10-CM | POA: Diagnosis not present

## 2013-09-02 DIAGNOSIS — G610 Guillain-Barre syndrome: Secondary | ICD-10-CM | POA: Diagnosis not present

## 2013-09-02 DIAGNOSIS — M545 Low back pain, unspecified: Secondary | ICD-10-CM | POA: Diagnosis not present

## 2013-09-04 ENCOUNTER — Telehealth: Payer: Self-pay

## 2013-09-04 DIAGNOSIS — G610 Guillain-Barre syndrome: Secondary | ICD-10-CM

## 2013-09-04 NOTE — Telephone Encounter (Signed)
Esther(OT @ [facility]) called to report that patient finished [facility]. She is requesting a outpatient PT order be sent to [facility] @ [phone]. Is this okay?

## 2013-09-05 NOTE — Telephone Encounter (Signed)
Yes, that's fine 

## 2013-09-05 NOTE — Telephone Encounter (Signed)
OP PT order placed and faxed to [facility] @ [phone]

## 2013-09-24 HISTORY — PX: TRANSTHORACIC ECHOCARDIOGRAM: SHX275

## 2013-10-01 ENCOUNTER — Encounter: Payer: Self-pay | Admitting: Physical Medicine & Rehabilitation

## 2013-10-01 ENCOUNTER — Encounter: Payer: Medicare Other | Attending: Physical Medicine & Rehabilitation | Admitting: Physical Medicine & Rehabilitation

## 2013-10-01 DIAGNOSIS — G610 Guillain-Barre syndrome: Secondary | ICD-10-CM

## 2013-10-01 DIAGNOSIS — M549 Dorsalgia, unspecified: Secondary | ICD-10-CM

## 2013-10-01 MED ORDER — OXYCODONE HCL 10 MG PO TABS: 5.0000 mg | ORAL_TABLET | Freq: Four times a day (QID) | ORAL | Status: DC | PRN

## 2013-10-01 NOTE — Patient Instructions (Signed)
PLEASE CALL ME WITH ANY PROBLEMS OR QUESTIONS (#[phone]).      

## 2013-10-01 NOTE — Progress Notes (Signed)
Subjective:    Patient ID: Brent [name], male    DOB: 1945-05-16, 68 y.o.   MRN: [id number]  HPI  Mr. Creasy is back regarding his GBS. He is improving. He still notes paresthesias and dysesthesias particularly in the RLE. The pain is worst at night.   He was placed on neurontin by his neurologist but is not using it consistently. He is taking oxycodone usually at 10pm each night. This seems to help him get to sleep. His pcp wrote for hydrocodone recently. He's no longer on the robaxin.   He reports no muscle spasms. Bowel and bladder habits are normal.   He's using a straight cane for ambulation.  Pain Inventory Average Pain 5 Pain Right Now 7 My pain is sharp, stabbing and tingling  In the last 24 hours, has pain interfered with the following? General activity 5 Relation with others 6 Enjoyment of life 9 What TIME of day is your pain at its worst? night Sleep (in general) Poor  Pain is worse with: bending, inactivity and some activites Pain improves with: rest, heat/ice, therapy/exercise and pacing activities Relief from Meds: 7  Mobility walk without assistance use a cane ability to climb steps?  yes do you drive?  no  Function retired  Neuro/Psych numbness tingling spasms  Prior Studies Any changes since last visit?  no  Physicians involved in your care Any changes since last visit?  no   Family History  Problem Relation Age of Onset  . Parkinson's disease Other   . Stroke Other   . Diabetes Sister    History   Social History  . Marital Status: Married    Spouse Name: N/A    Number of Children: N/A  . Years of Education: N/A   Social History Main Topics  . Smoking status: Current Every Day Smoker -- 1.00 packs/day    Types: Cigarettes  . Smokeless tobacco: None  . Alcohol Use: No  . Drug Use: No  . Sexual Activity: None   Other Topics Concern  . None   Social History Narrative  . None   Past Surgical History  Procedure  Laterality Date  . Hernia repair    . Lymph node biopsy    . Deep neck lymph node biopsy / excision Right   . Shoulder    . Shoulder arthroscopy Right    Past Medical History  Diagnosis Date  . Medical history non-contributory    There were no vitals taken for this visit.  Opioid Risk Score:   Fall Risk Score: Moderate Fall Risk (6-13 points) (educated and given handout on fall prevention in the home) Review of Systems  Constitutional: Positive for appetite change and unexpected weight change.  Musculoskeletal:       Spasms  Neurological: Positive for numbness.       Tingling  All other systems reviewed and are negative.      Objective:   Physical Exam  Constitutional: He is oriented to person, place, and time.  Thin well developed male. NAD  HENT:  Head: Normocephalic and atraumatic.     Eyes: Conjunctivae are normal.  Diplopia almost resolved.   Neck: Normal range of motion. Neck supple.  Cardiovascular: Normal rate and regular rhythm.  Respiratory: Effort normal and breath sounds normal. No respiratory distress. He has no wheezes.  GI: Soft. Bowel sounds are normal.  Musculoskeletal: He exhibits no edema and no tenderness. Back has mild tenderness to palpation in the lumbar paraspinals mainly at  L3-L4     Neurological: He is alert and oriented to person, place, and time. Speech a little dysarthric. Cognition appears to be intact. Sensory changes bilateral hands (to wrists) and feet  (to ankles) to LT and proprioception.   Left hip flexors 4+,  5/5 knee flexors and plantar flexors, Dorsiflexors 4+/5. RLE 4+/5 HF, KE, except DF 4/5. UE's Appear to be 5/5 proximal to distal. Areflexia noted.  Skin: Skin is warm and dry.    Assessment/Plan:   1. Functional deficits secondary to GBS  -right CN3 involvement as well which has improved. -recommend transition to outpt PT---rx was written today -recommend ongoing use of cane for balance -discussed recovery and likelihood  this a several month proposition -needs to continue with neuro follow up to assess pathology, neuro progress   2.  Pain Management: Oxycodone prn for back pain.  Decrease to 5mg  if tolerated  -willing to write one more rx before we enroll him in our CSA  -he may want to discuss with his PCP as well who could potentially fill.  -continue with gabapentin which he needs to take on a scheduled basis   -this will likely need further titraiton  3. At this point, he as adequate follow up in [place] between neuro and his pcp. If he wishes to come back  And see me, I would be happy to oblige. Will make follow up her, however, prn for now. Thirty minutes of face to face patient care time were spent during this visit. All questions were encouraged and answered.   [name], MD, [facility] 10/01/2013

## 2013-10-09 ENCOUNTER — Telehealth: Payer: Self-pay | Admitting: *Deleted

## 2013-10-09 HISTORY — PX: NM MYOVIEW (ARMC HX): HXRAD1857

## 2013-10-09 NOTE — Telephone Encounter (Signed)
Please call back. No details as to why

## 2013-10-20 ENCOUNTER — Encounter: Payer: Self-pay | Admitting: Physical Medicine & Rehabilitation

## 2013-11-09 ENCOUNTER — Encounter: Payer: Self-pay | Admitting: Physical Medicine & Rehabilitation

## 2013-12-09 ENCOUNTER — Encounter: Payer: Self-pay | Admitting: Physical Medicine & Rehabilitation

## 2014-01-09 ENCOUNTER — Encounter: Payer: Self-pay | Admitting: Physical Medicine & Rehabilitation

## 2014-05-02 NOTE — Consult Note (Signed)
Referring Physician:  Aldean Jewett :   Primary Care Physician:  Aldean Jewett : [facility], [address] [place], [place] [address], [place] [phone]  Reason for Consult: Admit Date: 04-Aug-2013  Chief Complaint: numbness and tingling  Reason for Consult: numbness and tingling   History of Present Illness: History of Present Illness:   69 yo RHD M presents with acute onset of abdominal pain and then numbness and tingling in his hands and feet over the past 3 days.  This has began to progress up his legs.  He denies any back or neck pain.  No loss of bowel or bladder.  He now today feels like he is week and having problems walking.  He denies headache but does note that his R eye is droopy now all of a sudden.  No shortness of breath or problems swallowing.  ROS:  General weakness   HEENT no complaints   Lungs no complaints   Cardiac no complaints   GI no complaints   GU no complaints   Musculoskeletal no complaints   Extremities no complaints   Skin no complaints   Neuro numbness/tingling   Endocrine no complaints   Psych no complaints   Past Medical/Surgical Hx:  lymph node removed from right side of neck 4 years:   Past Medical/ Surgical Hx:  Past Medical History none   Past Surgical History lymph node removal   Home Medications: Medication Instructions Last Modified Date/Time  Protonix 40 mg granule, enteric coated 1 ea orally once a day x 30 days 27-Jul-15 12:00  acetaminophen-oxyCODONE 325 mg-5 mg tablet 1-2 tab(s) orally every 4-6 hours- as needed  27-Jul-15 12:00   Allergies:  No Known Allergies:   Allergies:  Allergies NKDA   Social/Family History: Employment Status: retired  Lives With: spouse  Living Arrangements: house  Social History: + tob, no EtOH, no illicits  Family History: no strokes, no seizures   Vital Signs: **Vital Signs.:   28-Jul-15 07:29  Vital Signs Type Q 8hr  Temperature Temperature (F)  97.8  Celsius 36.5  Temperature Source oral  Pulse Pulse 70  Respirations Respirations 17  Systolic BP Systolic BP 774  Diastolic BP (mmHg) Diastolic BP (mmHg) 69  Mean BP 89  Pulse Ox % Pulse Ox % 97  Pulse Ox Activity Level  At rest  Oxygen Delivery Room Air/ 21 %   Physical Exam: General: thin, NAD  HEENT: normocephalic, sclera nonicteric, oropharynx clear  Neck: supple, no JVD, no bruits  Chest: CTA B, no wheezing, good movement  Cardiac: RRR, no murmurs, no edema, 2+ pulses  Extremities: no C/C/E, FROM   Neurologic Exam: Mental Status: alert and oriented x 3, normal speech and language, follows complex commands  Cranial Nerves: PERRLA, EOMI, nl VF, face symmetric, tongue midline, shoulder shrug equal  Motor Exam: 5-/5 B, nl tone, no fasciculations, no atrophy  Deep Tendon Reflexes: 0+/4 B except 2+/4 B biceps, mute plantars  Sensory Exam: decrease pin and vibration in stocking pattern  Coordination: moderate dysmetria worse R>L   Lab Results: Thyroid:  27-Jul-15 20:42   Thyroid Stimulating Hormone 1.21 (0.45-4.50 (International Unit)  ----------------------- Pregnant patients have  different reference  ranges for TSH:  - - - - - - - - - -  Pregnant, first trimetser:  0.36 - 2.50 uIU/mL)  Hepatic:  27-Jul-15 12:03   Bilirubin, Total 0.5  Alkaline Phosphatase 77 (46-116 NOTE: New Reference Range 07/29/13)  SGPT (ALT) 33 (14-63 NOTE: New  Reference Range 07/29/13)  SGOT (AST)  38  Total Protein, Serum 7.7  Albumin, Serum 3.8  Routine Micro:  27-Jul-15 20:42   Micro Text Report HIV 1/2 AG AB COMBO   HIV 1/2 ANTIBODIES        NON-REACTIVE ANTIBODY   HIV-1 p24 ANTIGEN         NON-REACTIVE ANTIGEN   INTERPRETATION            NONREACTIVE.  A NONREACTIVE test result means that HIV-1 or HIV-2 antibodies and HIV-1 p24 antigen were not detected in the specimen.   ANTIBIOTIC                       Routine Chem:  27-Jul-15 12:03   Hemoglobin A1c [facility])  6.7 (The  American Diabetes Association recommends that a primary goal of therapy should be <7% and that physicians should reevaluate the treatment regimen in patients with HbA1c values consistently >8%.)  28-Jul-15 03:58   Glucose, Serum 92  BUN 11  Creatinine (comp) 0.77  Sodium, Serum 138  Potassium, Serum 3.5  Chloride, Serum 101  CO2, Serum 27  Calcium (Total), Serum 8.5  Anion Gap 10  Osmolality (calc) 275  eGFR (African American) >60  eGFR (Non-African American) >60 (eGFR values <51m/min/1.73 m2 may be an indication of chronic kidney disease (CKD). Calculated eGFR is useful in patients with stable renal function. The eGFR calculation will not be reliable in acutely ill patients when serum creatinine is changing rapidly. It is not useful in  patients on dialysis. The eGFR calculation may not be applicable to patients at the low and high extremes of body sizes, pregnant women, and vegetarians.)  Routine Hem:  27-Jul-15 20:42   Erythrocyte Sed Rate 1 (Result(s) reported on 04 Aug 2013 at 09:25PM.)  28-Jul-15 03:58   WBC (CBC) 5.8  RBC (CBC) 4.67  Hemoglobin (CBC) 14.4  Hematocrit (CBC) 44.6  Platelet Count (CBC) 218  MCV 96  MCH 30.9  MCHC 32.3  RDW 13.0  Neutrophil % 51.6  Lymphocyte % 32.8  Monocyte % 12.4  Eosinophil % 2.3  Basophil % 0.9  Neutrophil # 3.0  Lymphocyte # 1.9  Monocyte # 0.7  Eosinophil # 0.1  Basophil # 0.1 (Result(s) reported on 05 Aug 2013 at 05:14AM.)   Radiology Results: MRI:    27-Jul-15 17:25, MRI Brain Without Contrast  MRI Brain Without Contrast   REASON FOR EXAM:    numbness of hands and feet  COMMENTS:       PROCEDURE: MR  - MR BRAIN WO CONTRAST  - Aug 04 2013  5:25PM     CLINICAL DATA:  Numbness of the hands and legs since this morning.    EXAM:  MRI HEAD WITHOUT CONTRAST    TECHNIQUE:  Multiplanar, multiecho pulse sequences of the brain and surrounding  structures were obtained without intravenous contrast.    COMPARISON:   Head CT same day  FINDINGS:  Diffusion imaging does not show any acute or subacute infarction.  The brainstem is normal. There is an old infarction within the  inferior cerebellum on the right. The cerebral hemispheres are  normal. No stroke, mass lesion, hemorrhage, hydrocephalus or  extra-axial collection. Slight asymmetry of the temporal horns of  the lateral ventricles is probably not significant, particularly in  the absence of a seizure history.     IMPRESSION:  No acute or subacute stroke. No cause of the presenting symptoms is  identified. 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    Electronically Signed    By: Nelson Chimes M.D.    On: 08/04/2013 17:31         Verified By: Jules Schick, M.D.,  CT:    27-Jul-15 13:52, CT Head Without Contrast  CT Head Without Contrast   REASON FOR EXAM:    new onset r sided ptosis and generalized weakness,   numb hands and feet  COMMENTS:       PROCEDURE: CT  - CT HEAD WITHOUT CONTRAST  - Aug 04 2013  1:52PM     CLINICAL DATA:  Weakness, right-sided ptosis    EXAM:  CT HEAD WITHOUT CONTRAST    TECHNIQUE:  Contiguous axial images were obtained from the base of the skull  through the vertex without intravenous contrast.  COMPARISON:  None.    FINDINGS:  No skull fracture is noted. Paranasal sinuses and mastoid air cells  are unremarkable. No intracranial hemorrhage, mass effect or midline  shift. No acute cortical infarction. No mass lesion is noted on this  unenhanced scan. No hydrocephalus. The gray and white-matter  differentiation is preserved.     IMPRESSION:  No acute intracranial abnormality.  No acute cortical infarction.      Electronically Signed    By: Lahoma Crocker M.D.    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  Highly doubt organophospate poisoning as cause.  This is not myasthenia due to majority of sensory complaints. 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

## 2014-05-02 NOTE — Discharge Summary (Signed)
PATIENT NAME:  Brent [name], Brent [name] MR#:  [id number] DATE OF BIRTH:  [birth year]  DATE OF ADMISSION:  08/06/2013 DATE OF DISCHARGE:    ADMITTING DIAGNOSES:  1. Bilateral hand and foot numbness, as well as right eye with proptosis, felt to be due to possible Guillain-Barre syndrome related to the patient receiving Pneumovax. 2. Generalized weakness and deconditioning, needs further aggressive rehabilitation.  3. Lymph node removed from the right neck, benign.  4. History of gastroesophageal reflux disease.  5. Nicotine addiction.   CONSULTANTS: Neurology, Dr. Tory Emerald.   PERTINENT LABORATORIES AND EVALUATIONS: Admitting glucose 104, BUN 12, creatinine 0.89, sodium 139, potassium 3.8, chloride 104, CO2 of 29. His hemoglobin A1c was 6.7. WBC 7.6, albumin 3.6, bilirubin total 0.5, AST was 46, ALT 39. Troponin less than 0.02. TSH 1.21. WBC 6.1, hemoglobin 14.9, platelet count was 219,000. INR 1.1.   CSF showed RBCs 5, WBCs 0, lymphocytes 50, monocytes 50, glucose 66, protein CSF 30. HIV 1 and 2 was nonreactive. CSF fluid no growth. Vitamin B12 of 627. ANA panel was negative. CSF protein electrophoresis showed beta globulin to be elevated at 21.4. Myelin basic protein was 1.0, which is normal. IgA serum was 314.   Heavy metal screen was negative. Drug screen was barbiturates only positive.   MRI of the brain without contrast showed no acute abnormality. Old small vessel infarct in the inferior cerebellum on the right.   MRI of the lumbar spine without contrast showed no noncompressive disk bulge L3- L4, mild lateral recessed narrowing L4-L5, left lateral recess stenosis at L5-S1.   HOSPITAL COURSE: Please refer to the history and physical done by the admitting physician. The patient is a 69 year old male, who was not on any medications, who presented to the Emergency Room 2 days prior with abdominal pain, which was felt to be due to reflux. The patient apparently had a Pneumovax shot recently.  He presented back to the ED with complaint of having dense numbness in the hands and feet, and hands up to the wrist, and feet to the ankle. He was also noticed to have right-sided eye proptosis. Due to these symptoms, he was evaluated. Underwent an MRI, which was negative. He also had a lumbar spine MRI, which was nonrevealing. He was seen in consultation by Dr. Tory Emerald, who felt that he might have possible Guillain-Barre syndrome. He recommended treating the patient with IVIG, which was given to the patient for 5 days. The patient does have improvement in his symptoms and the paresthesias. Currently, he is doing better, but he still needs aggressive rehabilitation; therefore, he is currently being transferred to a rehabilitation facility for treatment.   DISCHARGE MEDICATIONS: Protonix 40 mg 1 tablet p.o. daily, Tylenol 650 q.6 h. p.r.n., aspirin 81 mg 1 tablet p.o. daily, nicotine 21 mg patch daily.   DIET: Regular consistency, mechanical soft, thin liquids, aspiration precautions medications and puree for easier swallowing.   ACTIVITY: As tolerated, aggressive PT evaluation and treatment.   FOLLOWUP: Follow with primary M.D. in 1 to 2 weeks. The patient is to have a speech therapist to follow the patient and recheck swallowing in 4 to 5 days.    TIME SPENT: 35 minutes on this discharge.     ____________________________ Lafonda Mosses. Posey Pronto, MD shp:jr D: 08/11/2013 10:06:22 ET T: 08/11/2013 10:37:43 ET JOB#: [id number]  cc: Gertie Broerman H. Posey Pronto, MD, <Dictator> Alric Seton MD ELECTRONICALLY SIGNED 08/20/2013 10:28

## 2014-05-02 NOTE — H&P (Signed)
PATIENT NAME:  Brent [name], Brent [name] MR#:  [id number] DATE OF BIRTH:  Mar 12, 1945  DATE OF ADMISSION:  08/04/2013  PRIMARY CARE PROVIDER:  Dr. Berlin Hun at [facility].   REFERRING EMERGENCY ROOM PHYSICIAN:  Dr. Sherrine Maples.  CHIEF COMPLAINT: Bilateral hand and foot and numbness.   HISTORY OF PRESENT ILLNESS:  This otherwise healthy 69 year old man with past medical history significant only for an Emergency Room visit 2 days ago for abdominal pain, which was attributed to reflux disease, presents today with acute onset of bilateral hand and foot numbness and right eye ptosis, which was present upon waking up this morning. He reports that he was in his normal state of health yesterday. He describes a dense numbness of the hands and feet, of the hand up to the wrist and the feet to the ankles with some aching in the left lower calf. He has no change in vision, no eye pain, no headache. He reports some spasticity and change in the motion of both legs. He reports dizziness upon standing and fatigue. He has not had any similar episodes in the past. He denies any recent respiratory tract or GI tract infectious-type symptoms. No fevers, chills, weight change.   PAST MEDICAL HISTORY:  This is a healthy 69 year old male who does not take any chronic medications.   PAST SURGICAL HISTORY:   1.  Lymph node removal from the right neck, benign.  2.  Colonoscopy.  3.  Hernia repair.   ALLERGIES:  No known allergies.   HOME MEDICATIONS:  None.   SOCIAL HISTORY:  This patient lives with his wife in [place]. 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 He reports that he smokes about 1/2 pack of cigarettes per day. He denies alcohol or illicit drug use.    FAMILY HISTORY: Positive for diabetes in multiple family members and a niece with Parkinson disease. No colon or breast cancer. No significant history of coronary artery disease or cerebrovascular disease. No other neurologic syndromes that he knows  of.   REVIEW OF SYSTEMS:  CONSTITUTIONAL: No fevers, chills, nausea, vomiting, change in weight. He does endorse fatigue at this time.  HEENT: Does have ptosis of the right eyelid. No vision change, eye pain, eye discharge, ear pain, change in hearing, difficulty swallowing, pain in the mouth, change in sense of taste.   RESPIRATORY: No shortness of breath, painful inspiration, wheezing, cough, hemoptysis. CARDIOVASCULAR: No palpitations, chest pain. decrease in exercise tolerance, syncope, edema.  GASTROINTESTINAL:  No nausea, vomiting, diarrhea, but he has had abdominal pain 2 days ago, which is now resolved.  GENITOURINARY:  No dysuria or frequency.   MUSCULOSKELETAL:  Does report spasticity in range of motion of the lower extremities. No swollen or painful joints. Range of motion is normal.  NEUROLOGIC:  He is alert and oriented. Denies confusion, headache, seizure, syncope. He does endorse, as discussed above, bilateral hand and foot numbness.  PSYCHIATRIC:  Denies any new anxiety or depression symptoms.   PHYSICAL EXAMINATION:  VITAL SIGNS: Temperature 97.4, pulse 78, respirations 20, blood pressure 147/83, oxygen saturation 98% on room air.  GENERAL: The patient is alert, calm. No acute distress.  HEENT:  Pupils are equal, round and reactive. Conjunctivae are clear. There is no icterus. There is no nasal discharge or lesion. Oral mucosa are pink and moist. No oral lesions. No exudates. NECK: Range of motion of the neck is full. Trachea is midline. There is some lymphadenopathy in the right anterior cervical chain - this  may be scar tissue from prior lymph node removal. No thyromegaly or thyroid nodule.  RESPIRATORY: Lungs are clear to auscultation bilaterally with good air movement. No respiratory distress,  CARDIOVASCULAR: Regular rate and rhythm, no murmurs, rubs or gallops.  ABDOMEN:  Soft, nontender, nondistended. No guarding. No rebound. No mass. No hepatosplenomegaly. Bowel sounds are  normal.  MUSCULOSKELETAL:  Range of motion is normal in all joints. There are no tender or swollen joints. Strength is 5+ throughout.  SKIN: There is no rash, no lesion, no wounds.  VASCULAR: No edema. Peripheral pulses are 2+.  NEUROLOGIC: There is right-sided ptosis, otherwise, cranial nerves II through XII are grossly intact. The patient is alert and oriented x 4. He describes a dense numbness over the hands and feet bilaterally to the wrists and ankles. Strength is 5/5 throughout.  PSYCHIATRIC:  The patient's affect and mood are normal. No signs of uncontrolled depression or anxiety.   LABORATORY DATA:  Sodium is 131. Potassium is 3.7. Chloride is 103. Bicarb is 28. BUN is 11. Creatinine is 0.78. Glucose is 87. Calcium is 8.8. Alk phos is 77, ALT 33, AST 38, protein 7.7, albumin 3.8. White blood cell count is 5.7. Hemoglobin is 14.9. Platelets are 231. MCV is 95.   IMAGING:  CT of the head without contrast shows no acute intracranial abnormality. X-ray of the orbita for foreign body shows no evidence of metallic foreign body in the orbits. MRI of the brain shows no acute or subacute stroke. No cause of presenting symptoms. There is an old, small vessel infarction in the inferior cerebellum on the right. MRI of the lumbar spine with and without contrast shows no likely cause of symptoms. There is mild noncompressive disk bulges at L3-L4, mild recess narrowing at L4-L5 and L5-S1.   ASSESSMENT AND PLAN:  1.  Acute onset of polyneuropathy:  Concern is high for Guillain-Barre syndrome. Neurology has been consulted and will see him in the morning.  Plan is for lumbar puncture at that time. Imaging is nonrevealing. Have ordered ESR, B12, A1c, SPEP, TSH, ANA, HIV. We will discontinue new medications Protonix and Percocet. We will check a urine drug panel. We will admit for further valuation.   TIME SPENT ON THIS ADMISSION:  40 minutes.     ____________________________ Earleen Newport. Volanda Napoleon,  MD cpw:dmm D: 08/04/2013 20:31:00 ET T: 08/04/2013 21:32:54 ET JOB#: [id number]  cc: Earleen Newport. Volanda Napoleon, MD, <Dictator> Aldean Jewett MD ELECTRONICALLY SIGNED 08/11/2013 16:02

## 2014-07-24 DIAGNOSIS — Z86010 Personal history of colonic polyps: Secondary | ICD-10-CM | POA: Insufficient documentation

## 2014-07-24 DIAGNOSIS — Z720 Tobacco use: Secondary | ICD-10-CM | POA: Insufficient documentation

## 2014-07-24 DIAGNOSIS — E878 Other disorders of electrolyte and fluid balance, not elsewhere classified: Secondary | ICD-10-CM | POA: Insufficient documentation

## 2014-07-24 DIAGNOSIS — H269 Unspecified cataract: Secondary | ICD-10-CM | POA: Insufficient documentation

## 2014-07-28 ENCOUNTER — Encounter: Payer: Self-pay | Admitting: Family Medicine

## 2014-08-10 ENCOUNTER — Ambulatory Visit (INDEPENDENT_AMBULATORY_CARE_PROVIDER_SITE_OTHER): Payer: Self-pay | Admitting: Unknown Physician Specialty

## 2014-08-10 ENCOUNTER — Encounter: Payer: Self-pay | Admitting: Unknown Physician Specialty

## 2014-08-10 DIAGNOSIS — Z029 Encounter for administrative examinations, unspecified: Secondary | ICD-10-CM

## 2014-08-10 DIAGNOSIS — Z024 Encounter for examination for driving license: Secondary | ICD-10-CM

## 2014-08-10 NOTE — Progress Notes (Signed)
f  BP 145/79 mmHg  Pulse 67  Temp(Src) 97.4 F (36.3 C)  Ht 5\' 8"  (1.727 m)  Wt 122 lb (55.339 kg)  BMI 18.55 kg/m2  SpO2 97%   Subjective:    Patient ID: Brent [name], male    DOB: Dec 15, 1945, 69 y.o.   MRN: [id number]  HPI: Brent [name] is a 69 y.o. male  Chief Complaint  Patient presents with  . Employment Physical    pt is here for DOT physical   DOT exam.  See form.  --  Relevant past medical, surgical, family and social history reviewed and updated as indicated. Interim medical history since our last visit reviewed. Allergies and medications reviewed and updated.  Review of Systems  Per HPI unless specifically indicated above     Objective:    BP 145/79 mmHg  Pulse 67  Temp(Src) 97.4 F (36.3 C)  Ht 5\' 8"  (1.727 m)  Wt 122 lb (55.339 kg)  BMI 18.55 kg/m2  SpO2 97%  Wt Readings from Last 3 Encounters:  08/10/14 122 lb (55.339 kg)  06/09/14 121 lb (54.885 kg)  08/11/13 117 lb 1 oz (53.1 kg)    Physical Exam  Results for orders placed or performed during the hospital encounter of 08/11/13  CBC WITH DIFFERENTIAL  Result Value Ref Range   WBC 6.7 4.0 - 10.5 K/uL   RBC 4.51 4.22 - 5.81 MIL/uL   Hemoglobin 14.5 13.0 - 17.0 g/dL   HCT 40.2 39.0 - 52.0 %   MCV 89.1 78.0 - 100.0 fL   MCH 32.2 26.0 - 34.0 pg   MCHC 36.1 (H) 30.0 - 36.0 g/dL   RDW 12.7 11.5 - 15.5 %   Platelets 234 150 - 400 K/uL   Neutrophils Relative % 47 43 - 77 %   Lymphocytes Relative 27 12 - 46 %   Monocytes Relative 24 (H) 3 - 12 %   Eosinophils Relative 1 0 - 5 %   Basophils Relative 1 0 - 1 %   Neutro Abs 3.1 1.7 - 7.7 K/uL   Lymphs Abs 1.8 0.7 - 4.0 K/uL   Monocytes Absolute 1.6 (H) 0.1 - 1.0 K/uL   Eosinophils Absolute 0.1 0.0 - 0.7 K/uL   Basophils Absolute 0.1 0.0 - 0.1 K/uL   WBC Morphology ATYPICAL LYMPHOCYTES   Comprehensive metabolic panel  Result Value Ref Range   Sodium 127 (L) 137 - 147 mEq/L   Potassium 4.6 3.7 - 5.3 mEq/L   Chloride 89 (L) 96 -  112 mEq/L   CO2 27 19 - 32 mEq/L   Glucose, Bld 88 70 - 99 mg/dL   BUN 22 6 - 23 mg/dL   Creatinine, Ser 0.67 0.50 - 1.35 mg/dL   Calcium 9.0 8.4 - 10.5 mg/dL   Total Protein 9.6 (H) 6.0 - 8.3 g/dL   Albumin 3.5 3.5 - 5.2 g/dL   AST 44 (H) 0 - 37 U/L   ALT 25 0 - 53 U/L   Alkaline Phosphatase 75 39 - 117 U/L   Total Bilirubin 1.7 (H) 0.3 - 1.2 mg/dL   GFR calc non Af Amer >90 >90 mL/min   GFR calc Af Amer >90 >90 mL/min   Anion gap 11 5 - 15  Pathologist smear review  Result Value Ref Range   Path Review Reactive appearing lymphocytes.   Basic metabolic panel  Result Value Ref Range   Sodium 129 (L) 137 - 147 mEq/L   Potassium 4.4 3.7 -  5.3 mEq/L   Chloride 91 (L) 96 - 112 mEq/L   CO2 28 19 - 32 mEq/L   Glucose, Bld 100 (H) 70 - 99 mg/dL   BUN 22 6 - 23 mg/dL   Creatinine, Ser 0.64 0.50 - 1.35 mg/dL   Calcium 8.9 8.4 - 10.5 mg/dL   GFR calc non Af Amer >90 >90 mL/min   GFR calc Af Amer >90 >90 mL/min   Anion gap 10 5 - 15  Basic metabolic panel  Result Value Ref Range   Sodium 127 (L) 137 - 147 mEq/L   Potassium 4.2 3.7 - 5.3 mEq/L   Chloride 91 (L) 96 - 112 mEq/L   CO2 27 19 - 32 mEq/L   Glucose, Bld 95 70 - 99 mg/dL   BUN 12 6 - 23 mg/dL   Creatinine, Ser 0.56 0.50 - 1.35 mg/dL   Calcium 8.8 8.4 - 10.5 mg/dL   GFR calc non Af Amer >90 >90 mL/min   GFR calc Af Amer >90 >90 mL/min   Anion gap 9 5 - 15      Assessment & Plan:   Problem List Items Addressed This Visit    None    Visit Diagnoses    Encounter for driver's license history and physical            Follow up plan: Return for With Dr. Sanda Klein for BP.

## 2014-08-27 ENCOUNTER — Other Ambulatory Visit: Payer: Self-pay

## 2014-08-27 ENCOUNTER — Encounter: Payer: Self-pay | Admitting: Family Medicine

## 2014-08-27 ENCOUNTER — Telehealth: Payer: Self-pay

## 2014-08-27 ENCOUNTER — Ambulatory Visit (INDEPENDENT_AMBULATORY_CARE_PROVIDER_SITE_OTHER): Payer: Medicare Other | Admitting: Family Medicine

## 2014-08-27 VITALS — BP 133/79 | HR 96 | Temp 97.3°F | Ht 67.0 in | Wt 117.0 lb

## 2014-08-27 DIAGNOSIS — Z720 Tobacco use: Secondary | ICD-10-CM | POA: Diagnosis not present

## 2014-08-27 DIAGNOSIS — G610 Guillain-Barre syndrome: Secondary | ICD-10-CM | POA: Diagnosis not present

## 2014-08-27 DIAGNOSIS — H269 Unspecified cataract: Secondary | ICD-10-CM | POA: Diagnosis not present

## 2014-08-27 DIAGNOSIS — Z86010 Personal history of colonic polyps: Secondary | ICD-10-CM | POA: Diagnosis not present

## 2014-08-27 DIAGNOSIS — Z0000 Encounter for general adult medical examination without abnormal findings: Secondary | ICD-10-CM | POA: Insufficient documentation

## 2014-08-27 NOTE — Assessment & Plan Note (Signed)
Encouraged complete cessation; he actually does not have a 30 pack year history so he does not qualify for chest CT for lung cancer screening; I did recommend a retroperitoneal [place] to rule out AAA; he politely declined; encouraged him to call back for the [place] order if he changes his mind

## 2014-08-27 NOTE — Patient Instructions (Addendum)
If you could quit smoking entirely, that would be best for your health GINA-->Please do request last colonoscopy report from GI doctor If you change your mind on hepatitis or hiv or aneurysm screening, then just call me Last colonoscopy was actually April 08, 2003 Health Maintenance  Topic Date Due  .  Hepatitis C: One time screening is recommended by Center for Disease Control  (CDC) for  adults born from [birth year] through 1964.   08/25/1945  . Colon Cancer Screening  09/11/1996  . Flu Shot  08/10/2014  . Shingles Vaccine  08/27/2015*  . Pneumonia vaccines (2 of 2 - PPSV23) 08/27/2015*  . Tetanus Vaccine  01/10/2019  *Topic was postponed. The date shown is not the original due date.  We are not going to give you any more vaccines period, no matter what the schedule above says We'll check to see when you next colonoscopy is due If you have not heard back about when your next colonoscopy should be within two weeks, give [place] a call Do try to eat healthy and stay active Return in one year for next check-up (medicare wellness)

## 2014-08-27 NOTE — Assessment & Plan Note (Signed)
Medicare wellness visit, subsequent visit done today; age-appropriate health care and guidance given

## 2014-08-27 NOTE — Assessment & Plan Note (Addendum)
Per Practice Partner, last colonoscopy was April 08, 2003 with instructions for 3 year follow-up; he was referred to GI last year, request records and see when next colonoscopy is due; to prevent him slipping through any cracks, I instructed him to call my office in 2 weeks if he has not heard back about when his next colonoscopy is due --- update While patient was checking out, it was determined that he was referred last year but he never actually went to the appointment; he is well overdue, therefore, for surveillance colonoscopy; I have entered a new referral to have him see GI for colonoscopy

## 2014-08-27 NOTE — Progress Notes (Signed)
Patient: [place] [name], Male    DOB: 05/18/1945, 69 y.o.   MRN: [id number]  Visit Date: 08/27/2014  Today's Provider: Enid Derry, MD   Chief Complaint  Patient presents with  . Annual Exam    Medicare Annual Wellness    Subjective:   [place] [name] is a 69 y.o. male who presents today for his Subsequent Annual Wellness Visit.  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  HENT: Negative for hearing loss.   Eyes: Negative for pain, discharge and visual disturbance.  Respiratory: Negative for cough and wheezing.   Cardiovascular: Negative for chest pain.  Gastrointestinal: Negative for blood in stool.  Endocrine: Negative for cold intolerance and heat intolerance.  Genitourinary: Negative for hematuria, decreased urine volume and difficulty urinating.  Musculoskeletal: Negative for joint swelling and arthralgias.  Skin: Negative for rash and wound.  Allergic/Immunologic: Negative for food allergies.  Neurological: Negative for headaches.  Hematological: Negative for adenopathy. Does not bruise/bleed easily.  Psychiatric/Behavioral: Negative for dysphoric mood.   Past Medical History  Diagnosis Date  . [name] syndrome July 2015    follow Prevnar vaccine  . Cataract   . Tobacco abuse   . History of colonic polyps   . Hypochloremia     Past Surgical History  Procedure Laterality Date  . Lymph node biopsy    . Deep neck lymph node biopsy / excision Right   . Shoulder    . Shoulder arthroscopy Right   . Hernia repair  1988    Family History  Problem Relation Age of Onset  . Parkinson's disease Other   .  Stroke Other   . Diabetes Sister   . Alcohol abuse Mother     Social History   Social History  . Marital Status: Married    Spouse Name: N/A  . Number of Children: N/A  . Years of Education: N/A   Occupational History  . Not on file.   Social History Main Topics  . Smoking status: Current Some Day Smoker -- 0.00 packs/day    Types: Cigarettes  . Smokeless tobacco: Never Used  . Alcohol Use: No  . Drug Use: No  . Sexual Activity: Yes   Other Topics Concern  . Not on file   Social History Narrative    Outpatient Encounter Prescriptions as of 08/27/2014  Medication Sig Note  . acetaminophen (TYLENOL) 325 MG tablet Take 1 tablet by mouth every 6 (six) hours as needed. 10/01/2013: Received from: [facility]:   . aspirin EC 81 MG EC tablet Take 1 tablet (81 mg total) by mouth daily.   Marland Kitchen gabapentin (NEURONTIN) 100 MG capsule Take 1 capsule by mouth 3 (three) times daily. 10/01/2013: Received from: External Pharmacy Received Sig:    No facility-administered encounter medications on file as of 08/27/2014.    Functional Ability / Safety Screening 1.  Was the timed Get Up and Go test longer than 30 seconds?  no 2.  Does the patient need help with the phone, transportation, shopping,      preparing meals, housework, laundry, medications, or managing money?  no 3.  Does the patient's home have:  loose throw rugs in the hallway?   no      Grab bars in the bathroom? yes      Handrails on the stairs?   yes      Poor lighting?   no 4.  Has the patient noticed any hearing difficulties?   no  Fall Risk Assessment See under rooming  Depression Screen See under rooming Depression screen [facility] 2/9 08/27/2014  Decreased Interest 0  Down, Depressed, Hopeless 0  PHQ - 2 Score 0   Advanced Directives Does patient have a HCPOA?    yes If yes, name and contact information: wife 1st, 2nd is daughter [place] [name] [phone] Does patient have a living  will or MOST form?  yes  Objective:   Vitals: BP 133/79 mmHg  Pulse 96  Temp(Src) 97.3 F (36.3 C)  Ht 5\' 7"  (1.702 m)  Wt 117 lb (53.071 kg)  BMI 18.32 kg/m2  SpO2 100% Body mass index is 18.32 kg/(m^2).  Visual Acuity Screening   Right eye Left eye Both eyes  Without correction: 20/25 20/20   With correction:      Physical Exam  Constitutional: He appears well-developed.  Thin frame and build  Cardiovascular: Normal rate.   Pulmonary/Chest: Effort normal. No respiratory distress.  Neurological: He is alert.  Skin: Skin is warm and dry.  Psychiatric: He has a normal mood and affect. His behavior is normal. Judgment and thought content normal.   Mood/affect:  euthymic Appearance:  Neat, casual  Cognitive Testing - 6-CIT  Correct? Score   What year is it? yes 0 Yes = 0    No = 4  What month is it? yes 0 Yes = 0    No = 3  Remember:     [name], [place], [place]     What time is it? yes 0 Yes = 0    No = 3  Count backwards from 20 to 1 yes 0 Correct = 0    1 error = 2   More than 1 error = 4  Say the months of the year in reverse. no 2 Correct = 0    1 error = 2   More than 1 error = 4  What address did I ask you to remember? yes 0 Correct = 0  1 error = 2    2 error = 4    3 error = 6    4 error = 8    All wrong = 10       TOTAL SCORE  2/28   Interpretation:  Normal  Normal (0-7) Abnormal (8-28)    Assessment & Plan:     Annual Wellness Visit  He does not have 30 pack years and does not qualify for chest CT Smoking cessation encouraged He declined one-time HIV and hepatitis C screening He declined AAA screening  Reviewed patient's Family Medical History Reviewed and updated list of patient's medical providers Assessment of cognitive impairment was done Assessed patient's functional ability Established a written schedule for health screening [facility] Completed and Reviewed  Immunization History  Administered Date(s)  Administered  . Pneumococcal Conjugate-13 07/25/2013  . Td 02/18/2003  . Tdap 01/09/2009  He will not get any more vaccines because of history of Guillain-Barre syndrome  Health Maintenance  Topic Date Due  . Hepatitis C Screening  12/09/1945  . COLONOSCOPY  09/11/1996  . INFLUENZA VACCINE  08/10/2014  . ZOSTAVAX  08/27/2015 (Originally  09/12/2006)  . PNA vac Low Risk Adult (2 of 2 - PPSV23) 08/27/2015 (Originally 07/26/2014)  . TETANUS/TDAP  01/10/2019  Colonoscopy per practice partner was actually done April 2005; he thinks he has had one more recent than that (see below, new GI referral ordered)   Discussed health benefits of physical activity, and encouraged him to engage in regular exercise appropriate for his age and condition.   No orders of the defined types were placed in this encounter.    Current outpatient prescriptions:  .  acetaminophen (TYLENOL) 325 MG tablet, Take 1 tablet by mouth every 6 (six) hours as needed., Disp: , Rfl:  .  aspirin EC 81 MG EC tablet, Take 1 tablet (81 mg total) by mouth daily., Disp: , Rfl:  .  gabapentin (NEURONTIN) 100 MG capsule, Take 1 capsule by mouth 3 (three) times daily., Disp: , Rfl:  There are no discontinued medications.  Next Medicare Wellness Visit in 12+ months  Problem List Items Addressed This Visit      Nervous and Auditory   Guillain Barr syndrome    No more vaccines to be given        Other   Cataract    He was referred to eye doctor last year; request records      Tobacco abuse    Encouraged complete cessation; he actually does not have a 30 pack year history so he does not qualify for chest CT for lung cancer screening; I did recommend a retroperitoneal [place] to rule out AAA; he politely declined; encouraged him to call back for the [place] order if he changes his mind      History of colonic polyps    Per [facility], last colonoscopy was April 08, 2003 with instructions for 3 year follow-up; he was referred to GI  last year, request records and see when next colonoscopy is due; to prevent him slipping through any cracks, I instructed him to call my office in 2 weeks if he has not heard back about when his next colonoscopy is due --- update While patient was checking out, it was determined that he was referred last year but he never actually went to the appointment; he is well overdue, therefore, for surveillance colonoscopy; I have entered a new referral to have him see GI for colonoscopy      Relevant Orders   Ambulatory referral to Gastroenterology   Preventative health care - Primary    Medicare wellness visit, subsequent visit done today; age-appropriate health care and guidance given        An after-visit summary was printed and given to the patient at [place].  Please see the patient instructions which may contain other information and recommendations beyond what is mentioned above in the assessment and plan.

## 2014-08-27 NOTE — Assessment & Plan Note (Signed)
No more vaccines to be given

## 2014-08-27 NOTE — Assessment & Plan Note (Signed)
He was referred to eye doctor last year; request records

## 2014-08-27 NOTE — Telephone Encounter (Signed)
Gastroenterology Pre-Procedure Review  Request Date: 09-22-14 Requesting Physician: Dr. Enid Derry  PATIENT REVIEW QUESTIONS: The patient responded to the following health history questions as indicated:    1. Are you having any GI issues? no 2. Do you have a personal history of Polyps? yes (5 years) 3. Do you have a family history of Colon Cancer or Polyps? no 4. Diabetes Mellitus? no 5. Joint replacements in the past 12 months?no 6. Major health problems in the past 3 months?no 7. Any artificial heart valves, MVP, or defibrillator?no    MEDICATIONS & ALLERGIES:    Patient reports the following regarding taking any anticoagulation/antiplatelet therapy:   Plavix, Coumadin, Eliquis, Xarelto, Lovenox, Pradaxa, Brilinta, or Effient? no Aspirin? yes (ASA 81mg )  Patient confirms/reports the following medications:  Current Outpatient Prescriptions  Medication Sig Dispense Refill  . acetaminophen (TYLENOL) 325 MG tablet Take 1 tablet by mouth every 6 (six) hours as needed.    Marland Kitchen aspirin EC 81 MG EC tablet Take 1 tablet (81 mg total) by mouth daily.    Marland Kitchen gabapentin (NEURONTIN) 100 MG capsule Take 1 capsule by mouth 3 (three) times daily.     No current facility-administered medications for this visit.    Patient confirms/reports the following allergies:  Allergies  Allergen Reactions  . Pneumococcal Vaccine Other (See Comments)    GUILLAIN BARRE SYNDROME NUMBNESS, SOB,CHEST PAIN   . Prevnar [Pneumococcal 13-Val Conj Vacc] Other (See Comments)    GUILLAIN-BARRE SYNDROME    No orders of the defined types were placed in this encounter.    AUTHORIZATION INFORMATION Primary Insurance: 1D#: Group #:  Secondary Insurance: 1D#: Group #:  SCHEDULE INFORMATION: Date: 09-22-14 Time: Location: [place]

## 2014-09-22 ENCOUNTER — Ambulatory Visit: Payer: Medicare Other | Admitting: Certified Registered"

## 2014-09-22 ENCOUNTER — Encounter
Admission: RE | Disposition: A | Discharge status: Home / Self Care | Payer: Self-pay | Source: Ambulatory Visit | Attending: Gastroenterology

## 2014-09-22 ENCOUNTER — Ambulatory Visit
Admission: RE | Admit: 2014-09-22 | Discharge: 2014-09-22 | Disposition: A | Discharge status: Home / Self Care | Payer: Medicare Other | Source: Ambulatory Visit | Attending: Gastroenterology | Admitting: Gastroenterology

## 2014-09-22 ENCOUNTER — Encounter: Payer: Self-pay | Admitting: Anesthesiology

## 2014-09-22 DIAGNOSIS — D123 Benign neoplasm of transverse colon: Secondary | ICD-10-CM | POA: Diagnosis not present

## 2014-09-22 DIAGNOSIS — G610 Guillain-Barre syndrome: Secondary | ICD-10-CM | POA: Diagnosis not present

## 2014-09-22 DIAGNOSIS — K640 First degree hemorrhoids: Secondary | ICD-10-CM | POA: Diagnosis not present

## 2014-09-22 DIAGNOSIS — D124 Benign neoplasm of descending colon: Secondary | ICD-10-CM | POA: Insufficient documentation

## 2014-09-22 DIAGNOSIS — Z79899 Other long term (current) drug therapy: Secondary | ICD-10-CM | POA: Diagnosis not present

## 2014-09-22 DIAGNOSIS — Z1211 Encounter for screening for malignant neoplasm of colon: Secondary | ICD-10-CM | POA: Diagnosis present

## 2014-09-22 DIAGNOSIS — D125 Benign neoplasm of sigmoid colon: Secondary | ICD-10-CM | POA: Insufficient documentation

## 2014-09-22 DIAGNOSIS — Z86010 Personal history of colonic polyps: Secondary | ICD-10-CM | POA: Insufficient documentation

## 2014-09-22 DIAGNOSIS — Z7982 Long term (current) use of aspirin: Secondary | ICD-10-CM | POA: Insufficient documentation

## 2014-09-22 DIAGNOSIS — F17210 Nicotine dependence, cigarettes, uncomplicated: Secondary | ICD-10-CM | POA: Insufficient documentation

## 2014-09-22 HISTORY — PX: COLONOSCOPY WITH PROPOFOL: SHX5780

## 2014-09-22 SURGERY — COLONOSCOPY WITH PROPOFOL
Anesthesia: General

## 2014-09-22 MED ORDER — LIDOCAINE HCL (CARDIAC) 20 MG/ML IV SOLN
INTRAVENOUS | Status: DC | PRN
  Administered 2014-09-22: 60 mg via INTRAVENOUS

## 2014-09-22 MED ORDER — PROPOFOL INFUSION 10 MG/ML OPTIME
INTRAVENOUS | Status: DC | PRN
  Administered 2014-09-22: 120 ug/kg/min via INTRAVENOUS

## 2014-09-22 MED ORDER — PROPOFOL 10 MG/ML IV BOLUS
INTRAVENOUS | Status: DC | PRN
  Administered 2014-09-22: 40 mg via INTRAVENOUS

## 2014-09-22 MED ORDER — SODIUM CHLORIDE 0.9 % IV SOLN
INTRAVENOUS | Status: DC
  Administered 2014-09-22: 1000 mL via INTRAVENOUS

## 2014-09-22 MED ORDER — PHENYLEPHRINE HCL 10 MG/ML IJ SOLN
INTRAMUSCULAR | Status: DC | PRN
  Administered 2014-09-22: 100 ug via INTRAVENOUS

## 2014-09-22 NOTE — Op Note (Signed)
[facility] Gastroenterology Patient Name: [place] [name] Procedure Date: 09/22/2014 7:43 AM MRN: [id number] Account #: [id number] Date of Birth: Mar 16, 1945 Admit Type: Outpatient Age: 69 Room: [facility] ENDO ROOM 4 Gender: Male Note Status: Finalized Procedure:         Colonoscopy Indications:       High risk colon cancer surveillance: Personal history of                     colonic polyps Providers:         Lucilla Lame, MD Referring MD:      Arnetha Courser (Referring MD) Medicines:         Propofol per Anesthesia Complications:     No immediate complications. Procedure:         Pre-Anesthesia Assessment:                    - Prior to the procedure, a History and Physical was                     performed, and patient medications and allergies were                     reviewed. The patient's tolerance of previous anesthesia                     was also reviewed. The risks and benefits of the procedure                     and the sedation options and risks were discussed with the                     patient. All questions were answered, and informed consent                     was obtained. Prior Anticoagulants: The patient has taken                     no previous anticoagulant or antiplatelet agents. ASA                     Grade Assessment: II - A patient with mild systemic                     disease. After reviewing the risks and benefits, the                     patient was deemed in satisfactory condition to undergo                     the procedure.                    After obtaining informed consent, the colonoscope was                     passed under direct vision. Throughout the procedure, the                     patient's blood pressure, pulse, and oxygen saturations                     were monitored continuously. The Olympus CF-Q160AL  colonoscope (S#. [id number]) was introduced through the anus                     and advanced to the  the cecum, identified by appendiceal                     orifice and ileocecal valve. The colonoscopy was performed                     without difficulty. The patient tolerated the procedure                     well. The quality of the bowel preparation was excellent. Findings:      The perianal and digital rectal examinations were normal.      Six sessile polyps were found in the transverse colon. The polyps were 6       to 9 mm in size. These polyps were removed with a cold snare. Resection       and retrieval were complete.      Two sessile polyps were found in the descending colon. The polyps were 4       to 5 mm in size. These polyps were removed with a cold snare. Resection       and retrieval were complete.      A 5 mm polyp was found in the sigmoid colon. The polyp was sessile. The       polyp was removed with a cold snare. Resection and retrieval were       complete.      Non-bleeding internal hemorrhoids were found during retroflexion. The       hemorrhoids were Grade I (internal hemorrhoids that do not prolapse). Impression:        - Six 6 to 9 mm polyps in the transverse colon. Resected                     and retrieved.                    - Two 4 to 5 mm polyps in the descending colon. Resected                     and retrieved.                    - One 5 mm polyp in the sigmoid colon. Resected and                     retrieved.                    - Non-bleeding internal hemorrhoids. Recommendation:    - Repeat colonoscopy in 3 years for surveillance. Procedure Code(s): --- Professional ---                    [phone], Colonoscopy, flexible; with removal of tumor(s),                     polyp(s), or other lesion(s) by snare technique Diagnosis Code(s): --- Professional ---                    Z86.010, Personal history of colonic polyps                    D12.3, Benign neoplasm of transverse colon  D12.4, Benign neoplasm of descending colon                     D12.5, Benign neoplasm of sigmoid colon CPT copyright 2014 American Medical Association. All rights reserved. The codes documented in this report are preliminary and upon coder review may  be revised to meet current compliance requirements. Lucilla Lame, MD 09/22/2014 8:41:31 AM This report has been signed electronically. Number of Addenda: 0 Note Initiated On: 09/22/2014 7:43 AM Scope Withdrawal Time: 0 hours 11 minutes 28 seconds  Total Procedure Duration: 0 hours 22 minutes 9 seconds       [facility]

## 2014-09-22 NOTE — Anesthesia Preprocedure Evaluation (Signed)
Anesthesia Evaluation  Patient identified by MRN, date of birth, ID band Patient awake    Reviewed: Allergy & Precautions, NPO status , Patient's Chart, lab work & pertinent test results  Airway Mallampati: II  TM Distance: >3 FB Neck ROM: Full    Dental  (+) Lower Dentures, Upper Dentures   Pulmonary Current Smoker,    Pulmonary exam normal breath sounds clear to auscultation       Cardiovascular negative cardio ROS Normal cardiovascular exam     Neuro/Psych Hx of Guilliane Barre after vaccination  Neuromuscular disease negative psych ROS   GI/Hepatic negative GI ROS, Neg liver ROS,   Endo/Other  negative endocrine ROS  Renal/GU negative Renal ROS  negative genitourinary   Musculoskeletal negative musculoskeletal ROS (+)   Abdominal Normal abdominal exam  (+)   Peds negative pediatric ROS (+)  Hematology negative hematology ROS (+)   Anesthesia Other Findings   Reproductive/Obstetrics                             Anesthesia Physical Anesthesia Plan  ASA: II  Anesthesia Plan: General   Post-op Pain Management:    Induction: Intravenous  Airway Management Planned: Nasal Cannula  Additional Equipment:   Intra-op Plan:   Post-operative Plan:   Informed Consent: I have reviewed the patients History and Physical, chart, labs and discussed the procedure including the risks, benefits and alternatives for the proposed anesthesia with the patient or authorized representative who has indicated his/her understanding and acceptance.   Dental advisory given  Plan Discussed with: CRNA and Surgeon  Anesthesia Plan Comments:         Anesthesia Quick Evaluation

## 2014-09-22 NOTE — Transfer of Care (Signed)
Immediate Anesthesia Transfer of Care Note  Patient: [place] [name]  Procedure(s) Performed: Procedure(s): COLONOSCOPY WITH PROPOFOL (N/A)  Patient Location: Endoscopy Unit  Anesthesia Type:General  Level of Consciousness: awake, alert , oriented and patient cooperative  Airway & Oxygen Therapy: Patient Spontanous Breathing and Patient connected to nasal cannula oxygen  Post-op Assessment: Report given to RN, Post -op Vital signs reviewed and stable and Patient moving all extremities X 4  Post vital signs: Reviewed and stable  Last Vitals:  Filed Vitals:   09/22/14 0848  BP: 138/70  Pulse: 64  Temp: 35.8 C  Resp: 20    Complications: No apparent anesthesia complications

## 2014-09-22 NOTE — H&P (Signed)
  [facility]  [address], [name], [place] [address] Phone: [phone] Fax : [phone]  Primary Care Physician:  Enid Derry, [place] Primary Gastroenterologist:  Dr. Allen Norris  Pre-Procedure History & Physical: HPI:  [place] [name] is a 69 y.o. [name] is here for an colonoscopy.   Past Medical History  Diagnosis Date  . 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 Cataract   . Tobacco abuse   . History of colonic polyps   . Hypochloremia     Past Surgical History  Procedure Laterality Date  . Lymph node biopsy    . Deep neck lymph node biopsy / excision Right   . Shoulder    . Shoulder arthroscopy Right   . Hernia repair  1988    Prior to Admission medications   Medication Sig Start Date End Date Taking? Authorizing Provider  acetaminophen (TYLENOL) 325 MG tablet Take 1 tablet by mouth every 6 (six) hours as needed.   Yes Historical Provider, [place]  aspirin EC 81 MG EC tablet Take 1 tablet (81 mg total) by mouth daily. 08/18/13  Yes Ivan Anchors Love, PA-C  gabapentin (NEURONTIN) 100 MG capsule Take 1 capsule by mouth 3 (three) times daily. 09/29/13  Yes Historical Provider, [place]    Allergies as of 08/27/2014 - Review Complete 08/27/2014  Allergen Reaction Noted  . Pneumococcal vaccine Other (See Comments) 10/01/2013  . Prevnar [pneumococcal 13-val conj vacc] Other (See Comments) 07/24/2014    Family History  Problem Relation Age of Onset  . Parkinson's disease Other   . Stroke Other   . Diabetes Sister   . Alcohol abuse Mother     Social History   Social History  . Marital Status: Married    Spouse Name: N/A  . Number of Children: N/A  . Years of Education: N/A   Occupational History  . Not on file.   Social History Main Topics  . Smoking status: Current Some Day Smoker -- 0.00 packs/day    Types: Cigarettes  . Smokeless tobacco: Never Used  . Alcohol Use: No  . Drug Use: No  . Sexual Activity: Yes   Other Topics  Concern  . Not on file   Social History Narrative    Review of Systems: See HPI, otherwise negative ROS  Physical Exam: BP 130/69 mmHg  Pulse 79  Temp(Src) 97.7 F (36.5 C) (Tympanic)  Resp 14  Ht 5\' 9"  (1.753 m)  Wt 125 lb (56.7 kg)  BMI 18.45 kg/m2  SpO2 100% General:   Alert,  pleasant and cooperative in NAD Head:  Normocephalic and atraumatic. Neck:  Supple; no masses or thyromegaly. Lungs:  Clear throughout to auscultation.    Heart:  Regular rate and rhythm. Abdomen:  Soft, nontender and nondistended. Normal bowel sounds, without guarding, and without rebound.   Neurologic:  Alert and  oriented x4;  grossly normal neurologically.  Impression/Plan: [name] is here for an colonoscopy to be performed for history of colon polyps.  Risks, benefits, limitations, and alternatives regarding  colonoscopy have been reviewed with the patient.  Questions have been answered.  All parties agreeable.   Ollen Bowl, [place]  09/22/2014, 7:56 AM

## 2014-09-23 ENCOUNTER — Encounter: Payer: Self-pay | Admitting: Gastroenterology

## 2014-09-23 LAB — SURGICAL PATHOLOGY

## 2014-09-23 NOTE — Anesthesia Postprocedure Evaluation (Signed)
  Anesthesia Post-op Note  Patient: [place] [name]  Procedure(s) Performed: Procedure(s): COLONOSCOPY WITH PROPOFOL (N/A)  Anesthesia type:General  Patient location: PACU  Post pain: Pain level controlled  Post assessment: Post-op Vital signs reviewed, Patient's Cardiovascular Status Stable, Respiratory Function Stable, Patent Airway and No signs of Nausea or vomiting  Post vital signs: Reviewed and stable  Last Vitals:  Filed Vitals:   09/22/14 0920  BP: 125/92  Pulse: 67  Temp:   Resp: 14    Level of consciousness: awake, alert  and patient cooperative  Complications: No apparent anesthesia complications

## 2014-09-24 ENCOUNTER — Encounter: Payer: Self-pay | Admitting: Gastroenterology

## 2015-07-10 DIAGNOSIS — I700 Atherosclerosis of aorta: Secondary | ICD-10-CM

## 2015-07-10 HISTORY — DX: Atherosclerosis of aorta: I70.0

## 2015-07-19 ENCOUNTER — Encounter: Payer: Self-pay | Admitting: Family Medicine

## 2015-07-19 ENCOUNTER — Ambulatory Visit (INDEPENDENT_AMBULATORY_CARE_PROVIDER_SITE_OTHER): Payer: Medicare Other | Admitting: Family Medicine

## 2015-07-19 VITALS — BP 112/60 | HR 99 | Temp 98.6°F | Resp 16 | Wt 120.3 lb

## 2015-07-19 DIAGNOSIS — Z86010 Personal history of colonic polyps: Secondary | ICD-10-CM | POA: Diagnosis not present

## 2015-07-19 DIAGNOSIS — R634 Abnormal weight loss: Secondary | ICD-10-CM | POA: Diagnosis not present

## 2015-07-19 DIAGNOSIS — R590 Localized enlarged lymph nodes: Secondary | ICD-10-CM | POA: Diagnosis not present

## 2015-07-19 DIAGNOSIS — Z720 Tobacco use: Secondary | ICD-10-CM | POA: Diagnosis not present

## 2015-07-19 DIAGNOSIS — Z125 Encounter for screening for malignant neoplasm of prostate: Secondary | ICD-10-CM

## 2015-07-19 LAB — CBC WITH DIFFERENTIAL/PLATELET
BASOS ABS: 0 {cells}/uL (ref 0–200)
BASOS PCT: 0 %
EOS ABS: 100 {cells}/uL (ref 15–500)
Eosinophils Relative: 2 %
HCT: 45.8 % (ref 38.5–50.0)
HEMOGLOBIN: 15.5 g/dL (ref 13.2–17.1)
LYMPHS ABS: 1600 {cells}/uL (ref 850–3900)
Lymphocytes Relative: 32 %
MCH: 31.8 pg (ref 27.0–33.0)
MCHC: 33.8 g/dL (ref 32.0–36.0)
MCV: 94 fL (ref 80.0–100.0)
MPV: 10 fL (ref 7.5–12.5)
Monocytes Absolute: 550 cells/uL (ref 200–950)
Monocytes Relative: 11 %
NEUTROS ABS: 2750 {cells}/uL (ref 1500–7800)
Neutrophils Relative %: 55 %
Platelets: 229 10*3/uL (ref 140–400)
RBC: 4.87 MIL/uL (ref 4.20–5.80)
RDW: 12.7 % (ref 11.0–15.0)
WBC: 5 10*3/uL (ref 3.8–10.8)

## 2015-07-19 LAB — COMPREHENSIVE METABOLIC PANEL
ALBUMIN: 4.7 g/dL (ref 3.6–5.1)
ALK PHOS: 67 U/L (ref 40–115)
ALT: 26 U/L (ref 9–46)
AST: 34 U/L (ref 10–35)
BILIRUBIN TOTAL: 0.5 mg/dL (ref 0.2–1.2)
BUN: 15 mg/dL (ref 7–25)
CO2: 29 mmol/L (ref 20–31)
CREATININE: 0.8 mg/dL (ref 0.70–1.25)
Calcium: 9.7 mg/dL (ref 8.6–10.3)
Chloride: 98 mmol/L (ref 98–110)
GLUCOSE: 68 mg/dL (ref 65–99)
Potassium: 5.2 mmol/L (ref 3.5–5.3)
SODIUM: 136 mmol/L (ref 135–146)
TOTAL PROTEIN: 7.5 g/dL (ref 6.1–8.1)

## 2015-07-19 LAB — TSH: TSH: 0.99 mIU/L (ref 0.40–4.50)

## 2015-07-19 LAB — LACTATE DEHYDROGENASE: LDH: 265 U/L — ABNORMAL HIGH (ref 94–250)

## 2015-07-19 LAB — T4, FREE: FREE T4: 1.4 ng/dL (ref 0.8–1.8)

## 2015-07-19 NOTE — Assessment & Plan Note (Signed)
With night sweats, lymphadenopathy; concerning symptoms; will start with labs and CT scan neck and chest; referral based on findings; close f/u unless etiology found and referred

## 2015-07-19 NOTE — Assessment & Plan Note (Signed)
He is working on quitting smoking; encoruagement given; see AVS

## 2015-07-19 NOTE — Assessment & Plan Note (Signed)
With history of remote biopsy; I do not have information on that; with night sweats, visible enlargement in the right anterolateral neck region in a smoker, will get CT scan of neck and chest as well as labs; close f/u and/or referral based on findings; patient agrees with plan

## 2015-07-19 NOTE — Assessment & Plan Note (Signed)
Last colonoscopy Sept 2016; next due in Sept 2019

## 2015-07-19 NOTE — Patient Instructions (Addendum)
I do encourage you to quit smoking Call [phone] to sign up for smoking cessation classes You can call 1-800-QUIT-NOW to talk with a smoking cessation coach  We'll get labs today We'll order the CT scan of your neck and chest  Do continue the Ensure

## 2015-07-19 NOTE — Assessment & Plan Note (Signed)
No known fam hx of prostate cancer; when offering tests, patient agreed to PSA testing

## 2015-07-19 NOTE — Progress Notes (Signed)
BP 112/60 mmHg  Pulse 99  Temp(Src) 98.6 F (37 C) (Oral)  Resp 16  Wt 120 lb 4.8 oz (54.568 kg)  SpO2 98%   Subjective:    Patient ID: Brent [name], male    DOB: 11/17/1945, 70 y.o.   MRN: [id number]  HPI: Brent [name] is a 70 y.o. male  Chief Complaint  Patient presents with  . Follow-up   He has been losing weight over the last several weeks Appetite is good Loses weight usually in the summer, but this has gotten to be too much Sometimes has abdominal pain when I asked if he hurts anywhere; just tender one time, but it's gotten better now; not sure if coming from the medicine Just had a colonoscopy in September 2016; next due in 2019 Trouble sitting still; got to move all the time; wife has thyroid disease, but no blood relative No loose stools; feels a little shaky when he gets hungry Having headaches; does have a little trouble with balance sometimes; the headaches have been going on since he was in the service decades ago; got out of the service, [facility] and [facility] couldn't [facility]; saw them 2-3 times; tries to do it on his own; taking Goody's powders and tylenol; helps bring the pain down Sees neurologist, saw him last month, but he does not know about his headaches; he did not do all that nerve testing last time Having night sweats x 3 weeks; had a lymph node biopsied on the right side of the neck years ago; maybe 15 years ago; everything was okay  Depression screen [facility] 2/9 07/19/2015 08/27/2014  Decreased Interest 0 0  Down, Depressed, Hopeless 2 0  PHQ - 2 Score 2 0  Altered sleeping 3 -  Tired, decreased energy 3 -  Change in appetite 3 -  Feeling bad or failure about yourself  0 -  Trouble concentrating 0 -  Moving slowly or fidgety/restless 1 -  Suicidal thoughts 0 -  PHQ-9 Score 12 -  Difficult doing work/chores Not difficult at all -  he says he is only depressed about losing weight; not losing weight b/c he is  depressed  Relevant past medical, surgical, family and social history reviewed Past Medical History  Diagnosis Date  . 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 Cataract   . Tobacco abuse   . History of colonic polyps   . Hypochloremia    Past Surgical History  Procedure Laterality Date  . Lymph node biopsy    . Deep neck lymph node biopsy / excision Right   . Shoulder    . Shoulder arthroscopy Right   . Hernia repair  1988  . Colonoscopy with propofol N/A 09/22/2014    Procedure: COLONOSCOPY WITH PROPOFOL;  Surgeon: Lucilla Lame, MD;  Location: [facility] ENDOSCOPY;  Service: Endoscopy;  Laterality: N/A;   Family History  Problem Relation Age of Onset  . Parkinson's disease Other   . Stroke Other   . Diabetes Sister   . Alcohol abuse Mother    Social History  Substance Use Topics  . Smoking status: Current Some Day Smoker -- 0.00 packs/day    Types: Cigarettes  . Smokeless tobacco: Never Used  . Alcohol Use: No  MD note: just smoking 1-2 cigarettes a day; uses patch but it makes him feel worse  Interim medical history since last visit reviewed. Allergies and medications reviewed  Review of Systems  Respiratory: Positive for shortness of breath (sometimes if he gets to doing something too much). Negative for cough.   Gastrointestinal: Negative for blood in stool.  Endocrine: Positive for polyuria (especially in the morning).  Genitourinary: Negative for hematuria.   Per HPI unless specifically indicated above     Objective:    BP 112/60 mmHg  Pulse 99  Temp(Src) 98.6 F (37 C) (Oral)  Resp 16  Wt 120 lb 4.8 oz (54.568 kg)  SpO2 98%  Wt Readings from Last 3 Encounters:  07/19/15 120 lb 4.8 oz (54.568 kg)  09/22/14 125 lb (56.7 kg)  08/27/14 117 lb (53.071 kg)    Physical Exam  Constitutional: He appears well-developed. No distress.  Thin; down almost five pounds since last visit  HENT:  Head: Normocephalic and atraumatic.  Eyes:  EOM are normal. No scleral icterus.  Neck: No thyromegaly present.  Cardiovascular: Normal rate and regular rhythm.   Pulmonary/Chest: Effort normal and breath sounds normal.  Abdominal: Soft. Bowel sounds are normal. He exhibits no distension. There is no hepatosplenomegaly. There is no tenderness.  Musculoskeletal: He exhibits no edema.  Lymphadenopathy:    He has cervical adenopathy.       Right: Supraclavicular adenopathy present.  Visible enlargement of the nodes on the right side, lateral neck and above clavicle on the right  Neurological: Coordination normal.  Reflex Scores:      Patellar reflexes are 1+ on the right side and 1+ on the left side. Not hyperreflexic  Skin: Skin is warm and dry. No pallor.  Psychiatric: He has a normal mood and affect. His behavior is normal. Judgment and thought content normal. His mood appears not anxious. He does not exhibit a depressed mood.    Results for orders placed or performed during the hospital encounter of 09/22/14  Surgical pathology  Result Value Ref Range   SURGICAL PATHOLOGY      Surgical Pathology CASE: [id number] PATIENT: Brent [name] Surgical Pathology Report     SPECIMEN SUBMITTED: A. Colon polyp x2, descending, cold snare B. Colon polyp x6, transverse, cold snare C. Colon polyp, sigmoid, cold snare  CLINICAL HISTORY: None provided  PRE-OPERATIVE DIAGNOSIS: hx of colon polyps Z86.010  POST-OPERATIVE DIAGNOSIS: colon polyps     DIAGNOSIS: A. COLON POLYP 2, DESCENDING; COLD SNARE: - TUBULAR ADENOMA (1), NEGATIVE FOR HIGH-GRADE DYSPLASIA AND MALIGNANCY. - POLYPOID COLONIC MUCOSA WITHIN NORMAL LIMITS (1).  B. COLON POLYP 6, TRANSVERSE; COLD SNARE: - TUBULAR ADENOMAS (6). - NEGATIVE FOR HIGH-GRADE DYSPLASIA AND MALIGNANCY.  C. COLON POLYP, SIGMOID; COLD SNARE: - TUBULAR ADENOMA. - NEGATIVE FOR HIGH-GRADE DYSPLASIA AND MALIGNANCY.    GROSS DESCRIPTION: A. Labeled: Descending colon polyp cold  snare 2 Tissue Fragment(s): 2 Measurement: 0.4 and 0.6 cm Comment: Pale-tan polypoid tissues  Entirely submitted in c assette(s): 1  B. Labeled: Transverse colon polyp cold snare 6 Tissue Fragment(s): Multiple Measurement: Aggregate 1.5 x 0.7 x 0.2 cm Comment: Tan tissue fragment  Entirely submitted in cassette(s): 1  C. Labeled: Sigmoid colon polyp cold snare Tissue Fragment(s): 1 Measurement: 0.4 cm Comment: Pink red polypoid fragment  Entirely submitted in cassette(s): 1       Final Diagnosis performed by Delorse Lek, MD.  Electronically signed 09/23/2014 10:33:38AM    The electronic signature indicates that the named Attending Pathologist has evaluated the specimen  Technical component performed at [facility], [address] [place], [place] [address] Lab: [phone] Dir: Darrick Penna. Evette Doffing, MD  Professional component performed at [facility], [facility],  [place], [place],  [address] Lab: [phone] Dir: Dellia Nims. Reuel Derby, MD        Assessment & Plan:   Problem List Items Addressed This Visit      Immune and Lymphatic   Lymphadenopathy of right cervical region    With history of remote biopsy; I do not have information on that; with night sweats, visible enlargement in the right anterolateral neck region in a smoker, will get CT scan of neck and chest as well as labs; close f/u and/or referral based on findings; patient agrees with plan      Relevant Orders   CBC with Differential/Platelet   Lactate Dehydrogenase (LDH)   CT Soft Tissue Neck W Contrast   CT Chest W Contrast     Other   Tobacco abuse    He is working on quitting smoking; encoruagement given; see AVS      Prostate cancer screening    No known fam hx of prostate cancer; when offering tests, patient agreed to PSA testing      Relevant Orders   PSA   History of colonic polyps    Last colonoscopy Sept 2016; next due in Sept 2019      Abnormal weight  loss - Primary    With night sweats, lymphadenopathy; concerning symptoms; will start with labs and CT scan neck and chest; referral based on findings; close f/u unless etiology found and referred      Relevant Orders   CBC with Differential/Platelet   Comprehensive metabolic panel   Lactate Dehydrogenase (LDH)   TSH   T4, free   CT Soft Tissue Neck W Contrast   CT Chest W Contrast      Follow up plan: Return in about 10 days (around 07/29/2015) for weight loss.  An after-visit summary was printed and given to the patient at [place].  Please see the patient instructions which may contain other information and recommendations beyond what is mentioned above in the assessment and plan.  Orders Placed This Encounter  Procedures  . CT Soft Tissue Neck W Contrast  . CT Chest W Contrast  . CBC with Differential/Platelet  . Comprehensive metabolic panel  . PSA  . Lactate Dehydrogenase (LDH)  . TSH  . T4, free

## 2015-07-20 ENCOUNTER — Telehealth: Payer: Self-pay | Admitting: Family Medicine

## 2015-07-20 LAB — PSA: PSA: 0.43 ng/mL (ref <=4.00)

## 2015-07-20 NOTE — Telephone Encounter (Signed)
LDH elevated; other labs normal; I called pt to discussed, did not reach him; left brief msg, just calling about labs

## 2015-07-28 ENCOUNTER — Ambulatory Visit
Admission: RE | Admit: 2015-07-28 | Discharge: 2015-07-28 | Disposition: A | Discharge status: Home / Self Care | Payer: Medicare Other | Source: Ambulatory Visit | Attending: Family Medicine | Admitting: Family Medicine

## 2015-07-28 DIAGNOSIS — R911 Solitary pulmonary nodule: Secondary | ICD-10-CM | POA: Insufficient documentation

## 2015-07-28 DIAGNOSIS — R634 Abnormal weight loss: Secondary | ICD-10-CM | POA: Insufficient documentation

## 2015-07-28 DIAGNOSIS — J439 Emphysema, unspecified: Secondary | ICD-10-CM | POA: Diagnosis not present

## 2015-07-28 DIAGNOSIS — I2510 Atherosclerotic heart disease of native coronary artery without angina pectoris: Secondary | ICD-10-CM | POA: Insufficient documentation

## 2015-07-28 DIAGNOSIS — R590 Localized enlarged lymph nodes: Secondary | ICD-10-CM | POA: Insufficient documentation

## 2015-07-28 DIAGNOSIS — I700 Atherosclerosis of aorta: Secondary | ICD-10-CM | POA: Diagnosis not present

## 2015-07-28 DIAGNOSIS — E042 Nontoxic multinodular goiter: Secondary | ICD-10-CM | POA: Diagnosis not present

## 2015-07-28 MED ORDER — IOPAMIDOL (ISOVUE-300) INJECTION 61%
100.0000 mL | Freq: Once | INTRAVENOUS | Status: AC | PRN
  Administered 2015-07-28: 100 mL via INTRAVENOUS

## 2015-07-29 ENCOUNTER — Other Ambulatory Visit: Payer: Self-pay | Admitting: Family Medicine

## 2015-07-29 ENCOUNTER — Ambulatory Visit (INDEPENDENT_AMBULATORY_CARE_PROVIDER_SITE_OTHER): Payer: Medicare Other | Admitting: Family Medicine

## 2015-07-29 ENCOUNTER — Encounter: Payer: Self-pay | Admitting: Family Medicine

## 2015-07-29 VITALS — BP 108/60 | HR 97 | Temp 98.2°F | Resp 16 | Wt 122.4 lb

## 2015-07-29 DIAGNOSIS — Q318 Other congenital malformations of larynx: Secondary | ICD-10-CM | POA: Diagnosis not present

## 2015-07-29 DIAGNOSIS — I6529 Occlusion and stenosis of unspecified carotid artery: Secondary | ICD-10-CM | POA: Insufficient documentation

## 2015-07-29 DIAGNOSIS — I2510 Atherosclerotic heart disease of native coronary artery without angina pectoris: Secondary | ICD-10-CM | POA: Diagnosis not present

## 2015-07-29 DIAGNOSIS — R499 Unspecified voice and resonance disorder: Secondary | ICD-10-CM

## 2015-07-29 DIAGNOSIS — I639 Cerebral infarction, unspecified: Secondary | ICD-10-CM | POA: Diagnosis not present

## 2015-07-29 DIAGNOSIS — I6523 Occlusion and stenosis of bilateral carotid arteries: Secondary | ICD-10-CM | POA: Diagnosis not present

## 2015-07-29 DIAGNOSIS — R9389 Abnormal findings on diagnostic imaging of other specified body structures: Secondary | ICD-10-CM | POA: Insufficient documentation

## 2015-07-29 DIAGNOSIS — R938 Abnormal findings on diagnostic imaging of other specified body structures: Secondary | ICD-10-CM | POA: Diagnosis not present

## 2015-07-29 DIAGNOSIS — I25119 Atherosclerotic heart disease of native coronary artery with unspecified angina pectoris: Secondary | ICD-10-CM | POA: Insufficient documentation

## 2015-07-29 HISTORY — DX: Cerebral infarction, unspecified: I63.9

## 2015-07-29 HISTORY — DX: Atherosclerotic heart disease of native coronary artery without angina pectoris: I25.10

## 2015-07-29 NOTE — Patient Instructions (Addendum)
We'll refer you to a lung doctor about the chest CT findings and the emphysema and scarring We'll have you see the cardiologist about the blockages in the arteries in your heart Continue daily aspirin We'll get you back to Dr. Pryor Ochoa Start magnesium oxide for headaches, 400 mg every evening Check your fingerstick blood sugar a few times when you get your headaches and we can see if related to low blood sugars Continue aspirin If headaches persist, then let me know and we'll have you see Dr. Posey Pronto for your headaches

## 2015-07-29 NOTE — Progress Notes (Signed)
BP 108/60   Pulse 97   Temp 98.2 F (36.8 C) (Oral)   Resp 16   Wt 122 lb 6.4 oz (55.5 kg)   SpO2 99%   BMI 18.08 kg/m     Subjective:    Patient ID: Brent [name], male    DOB: 01/13/1945, 70 y.o.   MRN: [id number]  HPI: Brent [name] is a 70 y.o. male  Chief Complaint  Patient presents with  . Follow-up   Patient is here for follow-up He had been having weight loss, but has thankfully gained 2+ pounds since last visit He had scans done and is here to review those  Soft tissue neck CT with contrast July 28, 2015 CLINICAL DATA: Night sweats for 3 weeks.  EXAM: CT NECK WITH CONTRAST  TECHNIQUE: Multidetector CT imaging of the neck was performed using the standard protocol following the bolus administration of intravenous contrast.  CONTRAST: 174m ISOVUE-300 IOPAMIDOL (ISOVUE-300) INJECTION 61%  COMPARISON: Brain MRI 08/04/13  FINDINGS: Pharynx and larynx: The nasopharynx is clear. The oropharynx and hypopharynx are normal. The epiglottis is normal. Mild asymmetry of the true vocal cords without focal abnormality. The supraglottic larynx, glottis and subglottic larynx are otherwise normal.  Salivary glands: Normal bilateral parotid glands and left submandibular gland. Right submandibular gland is not clearly seen and may be surgically absent.  Thyroid: Hypodense right thyroid nodule measuring 6 mm. Otherwise normal.  Lymph nodes: Status post right neck dissection. No enlarged or abnormal appearing cervical lymph nodes. Right supraclavicular lymph node measures 9 mm (series 2 image 91)  Vascular: The aortic arch calcification and mild bilateral carotid bifurcation calcification without hemodynamically significant stenosis.  Limited intracranial: Unremarkable  Visualized orbits: Unremarkable  Mastoids and visualized paranasal sinuses: Normal  Skeleton: Mild cervical degenerative changes. No focal lytic or blastic osseous  lesion.  Upper chest: Biapical scarring and/or septal thickening is more completely characterized on concomitant chest CT. Please see dedicated report.  IMPRESSION: 1. Findings of remote right neck dissection without cervical lymphadenopathy. 9 mm, normal appearing right supraclavicular lymph node. 2. No radiographically evident pharyngeal or laryngeal mass. 3. Aortic atherosclerosis.   Electronically Signed  By: KUlyses JarredM.D.  On: 07/28/2015 11:03  Chest CT with contrast July 28, 2015 CLINICAL DATA: 70year old male with weight loss, night sweats and history of right supraclavicular lymphadenopathy. Current smoker.  EXAM: CT CHEST WITH CONTRAST  TECHNIQUE: Multidetector CT imaging of the chest was performed during intravenous contrast administration.  CONTRAST: 1020mISOVUE-300 IOPAMIDOL (ISOVUE-300) INJECTION 61%  COMPARISON: No prior chest CT. 08/02/2013 chest radiograph and CT abdomen/pelvis.  FINDINGS: Mediastinum/Nodes: Normal heart size. No significant pericardial fluid/thickening. Left anterior descending, left circumflex and right coronary atherosclerosis. Atherosclerotic nonaneurysmal thoracic aorta. Normal caliber pulmonary arteries. No central pulmonary emboli. Hypodense 0.9 cm right thyroid lobe nodule. Unremarkable esophagus. No pathologically enlarged axillary, mediastinal or hilar lymph nodes.  Lungs/Pleura: No pneumothorax. No pleural effusion. Mild paraseptal emphysema. Symmetric bandlike subpleural consolidation, distortion and reticulation at both lung apices, most consistent with pleural-parenchymal scarring. Subpleural 3 mm posterior left upper lobe pulmonary nodule associated with the left major fissure (series 3/ image 39). No acute consolidative airspace disease or additional significant pulmonary nodules.  Upper abdomen: Unremarkable.  Musculoskeletal: No aggressive appearing focal osseous lesions. Mild thoracic  spondylosis.  IMPRESSION: 1. No pathologically enlarged thoracic lymph nodes. 2. Subpleural 3 mm left upper lobe pulmonary nodule, probably benign. No follow-up needed if patient is low-risk. Non-contrast chest CT can be considered in 12  months if patient is high-risk. This recommendation follows the consensus statement: Guidelines for Management of Incidental Pulmonary Nodules Detected on CT Images:From the Fleischner Society 2017; published online before print (10.1148/radiol.[id number]). 3. Mild paraseptal emphysema. 4. Mild symmetric biapical pleural-parenchymal scarring. No acute consolidative airspace disease. 5. Aortic atherosclerosis. Three-vessel coronary atherosclerosis. 6. Right thyroid lobe 0.9 cm nodule, for which no further imaging evaluation is required. This follows ACR consensus guidelines: Managing Incidental Thyroid Nodules Detected on Imaging: White Paper of the ACR Incidental Thyroid Findings Committee. J Am Coll Radiol 2015; 12:143-150.   Electronically Signed  By: Ilona Sorrel M.D.  On: 07/28/2015 12:45 --------------------------------  He has never been told before about any CAD, but has two half-sisters with heart problems; patient does get a little bit of pain in his chest Right submandibular gland was removed previously, not seen on scan No hoaseness; no throat pain or neck pain Reviewed labs and no anemia, normal PSA, normal glucose, normal TSH, free T4 He has headaches at times; not sure why; used to have them a long time ago and they quit and now came back; used to have them every night; decades ago; he went to see specialists and they couldn't find anything; he has tried tylenol, anything to help with headaches, Goody's powders, they help for a while but quit; he is not having daily headaches; mostly at night; thinks he drinks enough fluids during the day; not heat-related; does get shaky when these happen, might be blood sugars; sometimes sees  flashy lights, same as before; light bothers eyes, as do loud sounds  Depression screen [facility] 2/9 07/19/2015 08/27/2014  Decreased Interest 0 0  Down, Depressed, Hopeless 2 0  PHQ - 2 Score 2 0  Altered sleeping 3 -  Tired, decreased energy 3 -  Change in appetite 3 -  Feeling bad or failure about yourself  0 -  Trouble concentrating 0 -  Moving slowly or fidgety/restless 1 -  Suicidal thoughts 0 -  PHQ-9 Score 12 -  Difficult doing work/chores Not difficult at all -   Relevant past medical, surgical, family and social history reviewed Past Medical History:  Diagnosis Date  . Aortic calcification (HCC) 07/2015   Noted on chest CT with contrast  . Cataract   . Centrilobular emphysema ([place]) 08/03/2015   Noted on chest CT July 2017  . 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[place]) 07/29/2015  . Coronary artery calcification seen on CAT scan 07/29/2015   Noted on CT scan July 2017  . 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 History of colonic polyps   . Hypochloremia   . Right Supraclavicular Lymph node 08/03/2015  . Solitary pulmonary nodule 08/03/2015   CT Chest 07/2015 - Subpleural 3 mm left upper lobe pulmonary nodule, probably benign.  . Tobacco abuse    Past Surgical History:  Procedure Laterality Date  . COLONOSCOPY WITH PROPOFOL N/A 09/22/2014   Procedure: COLONOSCOPY WITH PROPOFOL;  Surgeon: Lucilla Lame, MD;  Location: [facility] ENDOSCOPY;  Service: Endoscopy;  Laterality: N/A;  . DEEP NECK LYMPH NODE BIOPSY / EXCISION Right   . HERNIA REPAIR  1988  . LYMPH NODE BIOPSY    . NM MYOVIEW ([place] [facility])  10/09/2013   Dr. Lujean Amel: EF 58%. Normal wall motion. No ischemia or infarction.  . shoulder    . SHOULDER ARTHROSCOPY Right   . TRANSTHORACIC ECHOCARDIOGRAM  09/24/2013   EF 45-50% with mild global hypokinesis. GR 1 DD. No valvular lesions besides  mild TR. Normal RV pressures.   Family History  Problem Relation Age of Onset  . Alcohol abuse Mother   . Parkinson's  disease Other   . Stroke Other   . Diabetes Sister   . Heart disease Other   . Heart disease Other    Social History  Substance Use Topics  . Smoking status: Current Some Day Smoker    Packs/day: 0.00    Years: 50.00    Types: Cigarettes  . Smokeless tobacco: Never Used     Comment: smokes 4 cig weekly.  . Alcohol use No   Interim medical history since last visit reviewed. Allergies and medications reviewed  Review of Systems  Constitutional: Positive for diaphoresis (night sweats are better). Negative for fever, chills and unexpected weight change.  HENT: Positive for trouble swallowing (sometimes, not sure if from straining, certain foods) and voice change (voice has changed over last year). Negative for sore throat.   Respiratory: Positive for shortness of breath (not that bad; only with exertion). Negative for chest tightness.   Cardiovascular: Positive for chest pain (last week, one episode, "just a little hurting"; lasted 2 minutes, no associated nausea or SHOB or sweating).   Per HPI unless specifically indicated above     Objective:    BP 108/60   Pulse 97   Temp 98.2 F (36.8 C) (Oral)   Resp 16   Wt 122 lb 6.4 oz (55.5 kg)   SpO2 99%   BMI 18.08 kg/m      Physical Exam  Constitutional: He appears well-developed.  Thin frame and build  Cardiovascular: Normal rate and regular rhythm.   Pulmonary/Chest: Effort normal. No respiratory distress.  Neurological: He is alert.  Skin: Skin is warm and dry.  Psychiatric: He has a normal mood and affect. His behavior is normal. Judgment and thought content normal.   Results for orders placed or performed in visit on 07/19/15  CBC with Differential/Platelet  Result Value Ref Range   WBC 5.0 3.8 - 10.8 K/uL   RBC 4.87 4.20 - 5.80 MIL/uL   Hemoglobin 15.5 13.2 - 17.1 g/dL   HCT 45.8 38.5 - 50.0 %   MCV 94.0 80.0 - 100.0 fL   MCH 31.8 27.0 - 33.0 pg   MCHC 33.8 32.0 - 36.0 g/dL   RDW 12.7 11.0 - 15.0 %   Platelets  229 140 - 400 K/uL   MPV 10.0 7.5 - 12.5 fL   Neutro Abs 2,750 1,500 - 7,800 cells/uL   Lymphs Abs 1,600 850 - 3,900 cells/uL   Monocytes Absolute 550 200 - 950 cells/uL   Eosinophils Absolute 100 15 - 500 cells/uL   Basophils Absolute 0 0 - 200 cells/uL   Neutrophils Relative % 55 %   Lymphocytes Relative 32 %   Monocytes Relative 11 %   Eosinophils Relative 2 %   Basophils Relative 0 %   Smear Review Criteria for review not met   Comprehensive metabolic panel  Result Value Ref Range   Sodium 136 135 - 146 mmol/L   Potassium 5.2 3.5 - 5.3 mmol/L   Chloride 98 98 - 110 mmol/L   CO2 29 20 - 31 mmol/L   Glucose, Bld 68 65 - 99 mg/dL   BUN 15 7 - 25 mg/dL   Creat 0.80 0.70 - 1.25 mg/dL   Total Bilirubin 0.5 0.2 - 1.2 mg/dL   Alkaline Phosphatase 67 40 - 115 U/L   AST 34 10 - 35 U/L  ALT 26 9 - 46 U/L   Total Protein 7.5 6.1 - 8.1 g/dL   Albumin 4.7 3.6 - 5.1 g/dL   Calcium 9.7 8.6 - 10.3 mg/dL  PSA  Result Value Ref Range   PSA 0.43 <=4.00 ng/mL  Lactate Dehydrogenase (LDH)  Result Value Ref Range   LDH 265 (H) 94 - 250 U/L  TSH  Result Value Ref Range   TSH 0.99 0.40 - 4.50 mIU/L  T4, free  Result Value Ref Range   Free T4 1.4 0.8 - 1.8 ng/dL      Assessment & Plan:   Problem List Items Addressed This Visit      Cardiovascular and Mediastinum   Coronary artery disease - Primary   Relevant Orders   Lipid panel   Ambulatory referral to Cardiology   Carotid artery calcification (Chronic)   Relevant Orders   Lipid panel   US Carotid Bilateral     Nervous and Auditory   Cerebellar infarction [facility])     Other   Abnormal chest CT   Relevant Orders   Ambulatory referral to Pulmonology    Other Visit Diagnoses    Vocal cord anomaly       Relevant Orders   Ambulatory referral to ENT   Change in voice       Relevant Orders   Ambulatory referral to ENT      Follow up plan: No Follow-up on file.  An after-visit summary was printed and given to the patient  at [place].  Please see the patient instructions which may contain other information and recommendations beyond what is mentioned above in the assessment and plan.  We'll refer you to a lung doctor about the chest CT findings and the emphysema and scarring We'll have you see the cardiologist about the blockages in the arteries in your heart Continue daily aspirin We'll get you back to Dr. Pryor Ochoa Start magnesium oxide for headaches, 400 mg every evening Check your fingerstick blood sugar a few times when you get your headaches and we can see if related to low blood sugars Continue aspirin If headaches persist, then let me know and we'll have you see Dr. Posey Pronto for your headaches  Orders Placed This Encounter  Procedures  . US Carotid Bilateral  . Lipid panel  . Ambulatory referral to Cardiology  . Ambulatory referral to ENT  . Ambulatory referral to Pulmonology

## 2015-07-29 NOTE — Telephone Encounter (Signed)
Patient seen today in office, reviewed scan and labs

## 2015-07-30 LAB — LIPID PANEL W/O CHOL/HDL RATIO
CHOLESTEROL TOTAL: 158 mg/dL (ref 100–199)
HDL: 47 mg/dL (ref >39)
LDL Calculated: 71 mg/dL (ref 0–99)
TRIGLYCERIDES: 201 mg/dL — AB (ref 0–149)
VLDL Cholesterol Cal: 40 mg/dL (ref 5–40)

## 2015-08-02 ENCOUNTER — Telehealth: Payer: Self-pay

## 2015-08-02 NOTE — Telephone Encounter (Signed)
Pt states you recommended something or was going to write rx for something for headaches?

## 2015-08-03 ENCOUNTER — Ambulatory Visit (INDEPENDENT_AMBULATORY_CARE_PROVIDER_SITE_OTHER): Payer: Medicare Other | Admitting: Internal Medicine

## 2015-08-03 ENCOUNTER — Encounter: Payer: Self-pay | Admitting: Internal Medicine

## 2015-08-03 VITALS — BP 130/62 | HR 96 | Ht 69.5 in | Wt 122.2 lb

## 2015-08-03 DIAGNOSIS — R938 Abnormal findings on diagnostic imaging of other specified body structures: Secondary | ICD-10-CM | POA: Diagnosis not present

## 2015-08-03 DIAGNOSIS — R591 Generalized enlarged lymph nodes: Secondary | ICD-10-CM

## 2015-08-03 DIAGNOSIS — J432 Centrilobular emphysema: Secondary | ICD-10-CM

## 2015-08-03 DIAGNOSIS — R911 Solitary pulmonary nodule: Secondary | ICD-10-CM

## 2015-08-03 DIAGNOSIS — Z720 Tobacco use: Secondary | ICD-10-CM

## 2015-08-03 DIAGNOSIS — R9389 Abnormal findings on diagnostic imaging of other specified body structures: Secondary | ICD-10-CM

## 2015-08-03 HISTORY — DX: Centrilobular emphysema: J43.2

## 2015-08-03 HISTORY — DX: Solitary pulmonary nodule: R91.1

## 2015-08-03 HISTORY — DX: Generalized enlarged lymph nodes: R59.1

## 2015-08-03 NOTE — Assessment & Plan Note (Signed)
CT chest with 3 mm posterior left upper lobe nodule, paraseptal emphysema, biapical upper lobe scarring with distortion. Given this history along with night sweats and subjective 7-8 pound weight loss, closer surveillance as needed. CT neck showed a 9 mm right supraclavicular lymph node, this lymph node along with a chest CT findings and the patient's clinical symptoms is suspicious for underlying malignancy. I have discussed this concern with the patient, at this time the lung nodule is too small to have any further workup other than surveillance. Right supraclavicular lymph node be further evaluated by ENT.  Plan: -Repeat CT chest in 3 months -Continue ENT follow-up

## 2015-08-03 NOTE — Addendum Note (Signed)
Addended by: Maryanna Shape A on: 08/03/2015 03:17 PM   Modules accepted: Orders

## 2015-08-03 NOTE — Assessment & Plan Note (Addendum)
Subpleural 3 mm left upper lobe pulmonary nodule Biapical pulmonary scarring and reticulation distortion of the lung architecture.  These findings are most likely due to his prolonged smoking history/abuse.  Plan: -Repeat CT chest with contrast in 3 months

## 2015-08-03 NOTE — Patient Instructions (Addendum)
Follow up with Dr. Stevenson Clinch in:4-6 weeks - pulmonary function testing and 6 minute walk test prior to follow up - CT Chest with Contrast in 3 months - lung nodule, scarring - cut back on smoking and quit

## 2015-08-03 NOTE — Progress Notes (Signed)
[facility] Consultation    Date: 08/03/2015  MRN# [id number] Brent [name] 04-17-1945  Referring Physician: Dr. Sanda Klein PMD - Dr. Milas Hock Hannen is a 70 y.o. old male seen in consultation for abnormal CT Chest  CC:  Chief Complaint  Patient presents with  . pulmonary consult    per Dr. Sanda Klein. last CT 07/28/15 c/o chest tightness.     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 Dyspnea has been ongoing for the past 2-3 months, accompanied with intermittent episodes of night sweats, weight loss (7-8 pounds). No dyspnea walking on flat surfaces, however walking up a flight of stairs does induce dyspnea. He has intermittent dry cough. Current tobacco user, 2-3 cigarettes per day for the past 4-5 months, previously 2 packs per day for 52 years, formerly worked as a Administrator, has dogs at home. Denies any hemoptysis. Given his weight loss along with dyspnea, CT neck/chest was performed, that showed 3 mm left upper lobe pulmonary nodule along with distortion and reticulation at both lung apices consistent with parenchymal scarring, 9 mm normal-appearing right supraclavicular lymph node.  PMHX:   Past Medical History:  Diagnosis Date  . Cataract   . 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 Coronary artery disease 07/29/2015   Noted on CT scan July 2017  . 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 History of colonic polyps   . Hypochloremia   . Tobacco abuse    Surgical Hx:  Past Surgical History:  Procedure Laterality Date  . COLONOSCOPY WITH PROPOFOL N/A 09/22/2014   Procedure: COLONOSCOPY WITH PROPOFOL;  Surgeon: Lucilla Lame, MD;  Location: [facility]  ENDOSCOPY;  Service: Endoscopy;  Laterality: N/A;  . DEEP NECK LYMPH NODE BIOPSY / EXCISION Right   . HERNIA REPAIR  1988  . LYMPH NODE BIOPSY    . shoulder    . SHOULDER ARTHROSCOPY Right    Family Hx:  Family History  Problem Relation Age of Onset  . Alcohol abuse Mother   . Parkinson's disease Other   . Stroke Other   . Diabetes Sister    Social Hx:   Social History  Substance Use Topics  . Smoking status: Current Some Day Smoker    Packs/day: 0.00    Types: Cigarettes  . Smokeless tobacco: Never Used  . Alcohol use No   Medication:   Current Outpatient Rx  . Order #: [id number] Class: Historical Med  . Order #: [id number] Class: No Print  . Order #: [id number] Class: Historical Med      Allergies:  Pneumococcal vaccine and Prevnar [pneumococcal 13-val conj vacc]  Review of Systems  Constitutional: Positive for diaphoresis, malaise/fatigue and weight loss.  Eyes: Negative for blurred vision.  Respiratory: Positive for cough and shortness of breath. Negative for hemoptysis and sputum production.   Gastrointestinal: Negative for heartburn, nausea and vomiting.  Skin: Negative for rash.  Neurological: Positive for tingling. Negative for dizziness, weakness and headaches.  Endo/Heme/Allergies: Does not bruise/bleed easily.     Physical Examination:   VS: BP 130/62 (BP Location: Left Arm, Cuff Size: Normal)   Pulse 96   Ht 5' 9.5" (1.765 m)   Wt 122 lb 3.2 oz (55.4 kg)   SpO2  98%   BMI 17.79 kg/m   General Appearance: No distress  Neuro:without focal findings, mental status, speech normal, alert and oriented, cranial nerves 2-12 intact, reflexes normal and symmetric, sensation grossly normal  HEENT: PERRLA, EOM intact, no ptosis, no other lesions noticed; Mallampati 2 Pulmonary: normal breath sounds., diaphragmatic excursion normal.No wheezing, No rales;   Sputum Production:  none CardiovascularNormal S1,S2.  No m/r/g.  Abdominal aorta pulsation normal.    Abdomen:  Benign, Soft, non-tender, No masses, hepatosplenomegaly, No lymphadenopathy Renal:  No costovertebral tenderness  GU:  No performed at this time. Endoc: No evident thyromegaly, no signs of acromegaly or Cushing features Skin:   warm, no rashes, no ecchymosis  Extremities: normal, no cyanosis, clubbing, no edema, warm with normal capillary refill. Other findings:none    Rad results: (The following images and results were reviewed by Dr. Stevenson Clinch on 08/03/2015). CT CHEST WITH CONTRAST  TECHNIQUE: Multidetector CT imaging of the chest was performed during intravenous contrast administration.  CONTRAST:  118mL ISOVUE-300 IOPAMIDOL (ISOVUE-300) INJECTION 61%  COMPARISON:  No prior chest CT. 08/02/2013 chest radiograph and CT abdomen/pelvis.  FINDINGS: Mediastinum/Nodes: Normal heart size. No significant pericardial fluid/thickening. Left anterior descending, left circumflex and right coronary atherosclerosis. Atherosclerotic nonaneurysmal thoracic aorta. Normal caliber pulmonary arteries. No central pulmonary emboli. Hypodense 0.9 cm right thyroid lobe nodule. Unremarkable esophagus. No pathologically enlarged axillary, mediastinal or hilar lymph nodes.  Lungs/Pleura: No pneumothorax. No pleural effusion. Mild paraseptal emphysema. Symmetric bandlike subpleural consolidation, distortion and reticulation at both lung apices, most consistent with pleural-parenchymal scarring. Subpleural 3 mm posterior left upper lobe pulmonary nodule associated with the left major fissure (series 3/ image 39). No acute consolidative airspace disease or additional significant pulmonary nodules.  Upper abdomen: Unremarkable.  Musculoskeletal: No aggressive appearing focal osseous lesions. Mild thoracic spondylosis.  IMPRESSION: 1. No pathologically enlarged thoracic lymph nodes. 2. Subpleural 3 mm left upper lobe pulmonary nodule, probably benign. No follow-up needed if patient is low-risk.  Non-contrast chest CT can be considered in 12 months if patient is high-risk. This recommendation follows the consensus statement: Guidelines for Management of Incidental Pulmonary Nodules Detected on CT Images:From the Fleischner Society 2017; published online before print (10.1148/radiol.IJ:2314499). 3. Mild paraseptal emphysema. 4. Mild symmetric biapical pleural-parenchymal scarring. No acute consolidative airspace disease. 5. Aortic atherosclerosis.  Three-vessel coronary atherosclerosis. 6. Right thyroid lobe 0.9 cm nodule, for which no further imaging evaluation is required. This follows ACR consensus guidelines: Managing Incidental Thyroid Nodules Detected on Imaging: White Paper of the ACR Incidental Thyroid Findings Committee. J Am Coll Radiol 2015; 12:143-150.  CT neck 07/28/15 1. Findings of remote right neck dissection without cervical lymphadenopathy. 9 mm, normal appearing right supraclavicular lymph node. 2. No radiographically evident pharyngeal or laryngeal mass. 3. Aortic atherosclerosis.    Assessment and Plan: 70 year old smoker seen in consultation for dyspnea and abnormal chest CT Solitary pulmonary nodule Subpleural 3 mm left upper lobe pulmonary nodule Biapical pulmonary scarring and reticulation distortion of the lung architecture.  These findings are most likely due to his prolonged smoking history/abuse.  Plan: -Repeat CT chest with contrast in 3 months  Centrilobular emphysema ([place]) CT chest with findings consistent with emphysema along with biapical scarring. 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 Tobacco counseling provided today  Plan: Pulmonary function testing and 6 minute walk test prior to follow-up visit Tobacco avoidance Exercise as tolerated   Right Supraclavicular Lymph node 9 mm right supraclavicular lymph node, round, benign appearance on imaging. I have discussed this finding  with the patient, at this time given the  type of scarring that he's having on his CAT scan along with smoking history, there is a strong concern to have this nodule possibly biopsied. He does have a follow-up with ENT, and we'll address this issue with them.  Plan: -Continue with close surveillance -Continue ENT follow-up  Tobacco abuse Tobacco Cessation - Counseling regarding benefits of smoking cessation strategies was provided for more than 12 min. - Educated that at this time smoking- cessation represents the single most important step that patient can take to enhance the length and quality of live. - Educated patient regarding alternatives of behavior interventions, pharmacotherapy including NRT and non-nicotine therapy such, and combinations of both. - Patient at this time: Will try to quit on his own    Abnormal chest CT CT chest with 3 mm posterior left upper lobe nodule, paraseptal emphysema, biapical upper lobe scarring with distortion. Given this history along with night sweats and subjective 7-8 pound weight loss, closer surveillance as needed. CT neck showed a 9 mm right supraclavicular lymph node, this lymph node along with a chest CT findings and the patient's clinical symptoms is suspicious for underlying malignancy. I have discussed this concern with the patient, at this time the lung nodule is too small to have any further workup other than surveillance. Right supraclavicular lymph node be further evaluated by ENT.  Plan: -Repeat CT chest in 3 months -Continue ENT follow-up   Updated Medication List Outpatient Encounter Prescriptions as of 08/03/2015  Medication Sig  . acetaminophen (TYLENOL) 325 MG tablet Take 1 tablet by mouth every 6 (six) hours as needed.  Marland Kitchen aspirin EC 81 MG EC tablet Take 1 tablet (81 mg total) by mouth daily.  Marland Kitchen gabapentin (NEURONTIN) 100 MG capsule Take 1 capsule by mouth 3 (three) times daily.   No facility-administered encounter medications on file as of 08/03/2015.     Orders  for this visit: Orders Placed This Encounter  Procedures  . CT CHEST W CONTRAST    Standing Status:   Future    Standing Expiration Date:   11/03/2015    Order Specific Question:   Reason for Exam (SYMPTOM  OR DIAGNOSIS REQUIRED)    Answer:   lung nodule    Order Specific Question:   Preferred imaging location?    Answer:   [facility]  . Pulmonary function test    Standing Status:   Future    Standing Expiration Date:   08/02/2016    Order Specific Question:   Where should this test be performed?    Answer:   [facility]    Order Specific Question:   Full PFT: includes the following: basic spirometry, spirometry pre & post bronchodilator, diffusion capacity (DLCO), lung volumes    Answer:   Full PFT  . 6 minute walk    Standing Status:   Future    Standing Expiration Date:   08/02/2016    Order Specific Question:   Where should this test be performed?    Answer:   Other     Thank  you for the consultation and for allowing [facility] to assist in the care of your patient. Our recommendations are noted above.  Please contact us if we can be of further service.   Vilinda Boehringer, MD  Pulmonary and Critical Care Office Number: [phone]  Note: This note was prepared with Dragon dictation along with smaller phrase technology. Any transcriptional errors that  result from this process are unintentional.

## 2015-08-03 NOTE — Assessment & Plan Note (Signed)
CT chest with findings consistent with emphysema along with biapical scarring. 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 Tobacco counseling provided today  Plan: Pulmonary function testing and 6 minute walk test prior to follow-up visit Tobacco avoidance Exercise as tolerated

## 2015-08-03 NOTE — Assessment & Plan Note (Signed)
Tobacco Cessation - Counseling regarding benefits of smoking cessation strategies was provided for more than 12 min. - Educated that at this time smoking- cessation represents the single most important step that patient can take to enhance the length and quality of live. - Educated patient regarding alternatives of behavior interventions, pharmacotherapy including NRT and non-nicotine therapy such, and combinations of both. - Patient at this time: Will try to quit on his own

## 2015-08-03 NOTE — Assessment & Plan Note (Signed)
9 mm right supraclavicular lymph node, round, benign appearance on imaging. I have discussed this finding with the patient, at this time given the type of scarring that he's having on his CAT scan along with smoking history, there is a strong concern to have this nodule possibly biopsied. He does have a follow-up with ENT, and we'll address this issue with them.  Plan: -Continue with close surveillance -Continue ENT follow-up

## 2015-08-08 NOTE — Telephone Encounter (Signed)
I suggested that he try magnesium supplementation for his headaches He can get that over-the-counter, mag-ox 400 mg daily If he continues to have headaches, we'll ask him to contact his neurologist Thank you

## 2015-08-09 NOTE — Telephone Encounter (Signed)
Pt.notified

## 2015-08-10 ENCOUNTER — Encounter: Payer: Self-pay | Admitting: Cardiology

## 2015-08-10 NOTE — Progress Notes (Signed)
PCP: Enid Derry, MD  PULMONOLOGIST: Vilinda Boehringer, MD  Clinic Note: Chief Complaint  Patient presents with  . Other    CAD. Meds reviewed verbally with pt.    HPI: Brent [name] is a 70 y.o. male with a PMH below who presents today for cardiology consultation for: Chest pain and coronary artery calcification noted on CT scan. He had one episode of chest pain that lasted about 1-2 minutes without associated dyspnea.  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 He was recently seen by pulmonary medicine (Dr. Stevenson Clinch) for progressive dyspnea with night sweats and a 7-8 pound weight loss. He denied any orthopnea, but did note exertional dyspnea walking up the steps. He is a chronic smoker who is down to 2-3 cigarettes a day over the last few months. Prior to that he would smoke 2 packs per day for 52 years. He had a CT scan of the neck/chest to evaluate his dyspnea that showed a 3 mm left upper lobe pulmonary nodule as well as a 9 mm right subclavicular lymph node. Also noted on the CT scan was coronary artery calcification.  Seen by Dr. Clayborn Bigness in April 2017.  Had Myoview in Oct 2015 an echocardiogram in September. The echo suggested mildly reduced EF, but the Myoview was negative for ischemia and showed normal EF.  Recent Hospitalizations: None  Studies Reviewed:  CT Chest with Contrast (07/28/2015): Normal heart size. No significant air cardinal fluid. LAD,LCx & RCA coronary atherosclerosis/calcification. Atherosclerotic-non-aneurysmal thoracic aorta. From Care Everywhere --> PMH/PSH updated  Echocardiogram @ [facility] 09/24/2013:   EF 45-50 % with mild global hypokinesis. GR 1 DD. Normal RV pressures. Only mild TR. Otherwise no valvular lesions.  Lexiscan Myoview @ [facility] 10/09/2013:   LVEF= 58 %;   Regional wall motion:reveals normal myocardial thickening and wall motion. The overall quality of the study is good. Artifacts noted: no Left ventricular cavity: normal.  Interval History: Brent [name] presents here today relatively unsure as to why he is here. He's not sure why he did not get referred back to his cardiologist at [facility] (Dr. Clayborn Bigness) actually saw him back in April. He understands that he had a CT scan done recently that suggested coronary artery calcification, and he mentioned that maybe about a month ago he had about 30 seconds to a minute or so sharp left-sided chest discomfort that was almost epigastric. He was active at a time but he has not had anything else like that. He has not had any other episodes of any chest discomfort or dyspnea with rest or exertion. He doesn't do a lot of walking simply because of his prior Guillain Barr syndrome related issues. He still has some balance issues. He also has some likely COPD related dyspnea that has been somewhat progressive in nature -- chest CT shows centrilobular emphysema. He has PFTs and 6 minute walk pending. He actually says that his dyspnea is improving after being worse for the last few months. He is trying to cut down on his cigarettes now to about 2-3 cigars a day over last few months but was smoking much more before that.  He also notes that his weight loss is actually stabilized now starting to put weight back on.  No further chest pain or pressure with rest or exertion.  No PND, orthopnea or edema.  No palpitations,  lightheadedness, dizziness, weakness or syncope/near syncope. No TIA/amaurosis fugax symptoms. No claudication.  ROS: A comprehensive was performed. Review of Systems  Constitutional: Negative for chills, fever, malaise/fatigue and weight loss (stable now).  HENT: Negative for nosebleeds.   Respiratory: Positive for cough (Routine) and shortness of breath (Pretty much what he describes  as his baseline.). Negative for wheezing.   Cardiovascular: Positive for claudication (non-limiting pain - not sure if this truly claudication or just arthritic pains. He doesn't describe very well.).       Per history of present illness  Gastrointestinal: Negative for blood in stool, heartburn and melena.  Genitourinary: Negative for hematuria.  Musculoskeletal: Negative for back pain and joint pain.  Neurological: Negative for dizziness.  Endo/Heme/Allergies: Does not bruise/bleed easily.  Psychiatric/Behavioral: Negative for depression and memory loss. The patient is not nervous/anxious and does not have insomnia.   All other systems reviewed and are negative.   Past Medical History:  Diagnosis Date  . Aortic calcification (HCC) 07/2015   Noted on chest CT with contrast  . Cataract   . Centrilobular emphysema ([place]) 08/03/2015   Noted on chest CT July 2017  . 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 Coronary artery calcification seen on CAT scan 07/29/2015   Noted on CT scan July 2017  . 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 History of colonic polyps   . Hypochloremia   . Right Supraclavicular Lymph node 08/03/2015  . Solitary pulmonary nodule 08/03/2015   CT Chest 07/2015 - Subpleural 3 mm left upper lobe pulmonary nodule, probably benign.  . Tobacco abuse     Past Surgical History:  Procedure Laterality Date  . COLONOSCOPY WITH PROPOFOL N/A 09/22/2014   Procedure: COLONOSCOPY WITH PROPOFOL;  Surgeon: Lucilla Lame, MD;  Location: [facility] ENDOSCOPY;  Service: Endoscopy;  Laterality: N/A;  . DEEP NECK LYMPH NODE BIOPSY / EXCISION Right   . HERNIA REPAIR  1988  . LYMPH NODE BIOPSY    . NM MYOVIEW ([place] [facility])  10/09/2013   Dr. Lujean Amel: EF 58%. Normal wall motion. No ischemia or infarction.  . shoulder    . SHOULDER ARTHROSCOPY Right   . TRANSTHORACIC ECHOCARDIOGRAM  09/24/2013   EF 45-50% with mild global hypokinesis. GR 1 DD. No valvular lesions  besides mild TR. Normal RV pressures.   Prior to Admission medications   Medication Sig Start Date End Date Taking? Authorizing Provider  acetaminophen (TYLENOL) 325 MG tablet Take 1 tablet by mouth every 6 (six) hours as needed.   Yes Historical Provider, MD  aspirin EC 81 MG EC tablet Take 1 tablet (81 mg total) by mouth daily. 08/18/13  Yes Ivan Anchors Love, PA-C  gabapentin (NEURONTIN) 100 MG capsule Take 1 capsule by mouth 3 (three) times daily. 09/29/13  Yes Historical Provider, MD    Allergies  Allergen Reactions  . Pneumococcal Vaccine Other (See Comments)    GUILLAIN BARRE SYNDROME NUMBNESS, SOB,CHEST PAIN   . Prevnar [Pneumococcal 13-Val Conj Vacc] Other (See Comments)    GUILLAIN-BARRE SYNDROME     Social History   Social History  . Marital status: Married    Spouse name: N/A  . Number of children: N/A  . Years of education: N/A   Social History Main Topics  . Smoking status: Current Some Day Smoker    Packs/day: 0.00    Years: 50.00    Types: Cigarettes  . Smokeless tobacco: Never Used  . Alcohol use No  .  Drug use: No  . Sexual activity: Yes   Other Topics Concern  . 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   Family History  Problem Relation Age of Onset  . Alcohol abuse Mother   . Parkinson's disease Other   . Stroke Other   . Diabetes Sister   . Heart disease Other   . Heart disease Other     Wt Readings from Last 3 Encounters:  08/11/15 120 lb 12 oz (54.8 kg)  08/03/15 122 lb 3.2 oz (55.4 kg)  07/29/15 122 lb 6.4 oz (55.5 kg)    PHYSICAL EXAM BP 130/70 (BP Location: Left Arm, Patient Position: Sitting, Cuff Size: Normal)   Pulse 82   Ht 5' 9.5" (1.765 m)   Wt 120 lb 12 oz (54.8 kg)   BMI 17.58 kg/m  General appearance: alert, cooperative, appears stated age, no distress and Relatively  thin but not frail. Well-groomed, and well-nourished. Neck: no adenopathy, no carotid bruit and no JVD Lungs: clear to auscultation bilaterally, normal percussion bilaterally and non-labored - no real increased worker breathing or accessory muscle use Heart: regular rate and rhythm, S1, S2 normal, no murmur, click, rub or gallop; nondisplaced PMI Abdomen: soft, non-tender; bowel sounds normal; no masses,  no organomegaly;  Extremities: extremities normal, atraumatic, no cyanosis, or edema  Pulses: 2+ and symmetric radial and femoral pulses. Somewhat diminished pedal pulses but still palpable. Skin: Somewhat dry scaly skin, but otherwise normal; no bleeding bruising or lesions. No rashes. Neurologic: Mental status: Alert, oriented, thought content appropriate Cranial nerves: normal (II-XII grossly intact)    Adult ECG Report  Rate: 82 ;  Rhythm: normal sinus rhythm and Minimal criteria for LVH. Otherwise normal axis, intervals and durations.;   Narrative Interpretation: Borderline normal EKG. Stable from before.   Other studies Reviewed: Additional studies/ records that were reviewed today include:  Recent Labs:   Lab Results  Component Value Date   CHOL 158 07/29/2015   HDL 47 07/29/2015   LDLCALC 71 07/29/2015   TRIG 201 (H) 07/29/2015    ASSESSMENT / PLAN: Problem List Items Addressed This Visit    Tobacco abuse (Chronic)    Smoking cessation instruction/counseling given:  counseled patient on the dangers of tobacco use, advised patient to stop smoking, and reviewed strategies to maximize success < 3 min (was lectured by Dr. Stevenson Clinch)      Relevant Orders   EKG 12-Lead (Completed)   Coronary artery calcification seen on CAT scan - Primary    Coronary artery calcification noted on CT scan is simply an anatomic finding. It is not physiologic. He had a stress test done just last year that was nonischemic suggesting normal EF despite the echocardiogram suggesting mildly reduced EF.  At this point he's not having any symptoms to suggest obstructive coronary disease. I don't think any additional evaluation is necessary at this time. He is on aspirin. His lipids look great without being on statin. Smoking cessation counseling is definitely warranted. He says he is pretty close to quitting.  I think this point he doesn't need to cardiologist. He is peripherally happy with his current cardiologist and I recommended recently follow-up with Dr. Clayborn Bigness as originally scheduled in roughly April of next year.       Centrilobular emphysema (HCC) (Chronic)    Currently  being followed by pulmonary medicine. He has PFTs and 6 minute walk pending. Not currently on any medications however. No active wheezing  Defer to Dr. Stevenson Clinch      Relevant Orders   EKG 12-Lead (Completed)   Aortic calcification (HCC) (Chronic)    He clearly has atherosclerotic disease related to his smoking probably because his lipids are pretty normal along with relative normal blood pressure.  no suggestion of aneurysmal dilation or significant lesions in SMA or carotids/subclavians.      Relevant Orders   EKG 12-Lead (Completed)    Other Visit Diagnoses   None.     Current medicines are reviewed at length with the patient today. (+/- concerns) None The following changes have been made: None Studies Ordered:   Orders Placed This Encounter  Procedures  . EKG 12-Lead   Follow-up with Dr. Clayborn Bigness as scheduled in April 2018   Glenetta Hew, M.D., M.S. Interventional Cardiologist   Pager # [phone] Phone # [phone] [address] [place], [place] [address]

## 2015-08-11 ENCOUNTER — Ambulatory Visit (INDEPENDENT_AMBULATORY_CARE_PROVIDER_SITE_OTHER): Payer: Medicare Other | Admitting: Cardiology

## 2015-08-11 ENCOUNTER — Encounter: Payer: Self-pay | Admitting: Cardiology

## 2015-08-11 VITALS — BP 130/70 | HR 82 | Ht 69.5 in | Wt 120.8 lb

## 2015-08-11 DIAGNOSIS — Z720 Tobacco use: Secondary | ICD-10-CM

## 2015-08-11 DIAGNOSIS — J432 Centrilobular emphysema: Secondary | ICD-10-CM

## 2015-08-11 DIAGNOSIS — I700 Atherosclerosis of aorta: Secondary | ICD-10-CM

## 2015-08-11 DIAGNOSIS — I2510 Atherosclerotic heart disease of native coronary artery without angina pectoris: Secondary | ICD-10-CM | POA: Diagnosis not present

## 2015-08-11 NOTE — Patient Instructions (Signed)
Medication Instructions:  Your physician recommends that you continue on your current medications as directed. Please refer to the Current Medication list given to you today.   Labwork: none  Testing/Procedures: none  Follow-Up: Your physician recommends that you schedule a follow-up appointment with Dr. Clayborn Bigness.    Any Other Special Instructions Will Be Listed Below (If Applicable).     If you need a refill on your cardiac medications before your next appointment, please call your pharmacy.

## 2015-08-13 ENCOUNTER — Encounter: Payer: Self-pay | Admitting: Cardiology

## 2015-08-13 NOTE — Assessment & Plan Note (Signed)
Coronary artery calcification noted on CT scan is simply an anatomic finding. It is not physiologic. He had a stress test done just last year that was nonischemic suggesting normal EF despite the echocardiogram suggesting mildly reduced EF. At this point he's not having any symptoms to suggest obstructive coronary disease. I don't think any additional evaluation is necessary at this time. He is on aspirin. His lipids look great without being on statin. Smoking cessation counseling is definitely warranted. He says he is pretty close to quitting.  I think this point he doesn't need to cardiologist. He is peripherally happy with his current cardiologist and I recommended recently follow-up with Dr. Clayborn Bigness as originally scheduled in roughly April of next year.

## 2015-08-13 NOTE — Assessment & Plan Note (Signed)
Smoking cessation instruction/counseling given:  counseled patient on the dangers of tobacco use, advised patient to stop smoking, and reviewed strategies to maximize success < 3 min (was lectured by Dr. Stevenson Clinch)

## 2015-08-13 NOTE — Assessment & Plan Note (Addendum)
He clearly has atherosclerotic disease related to his smoking probably because his lipids are pretty normal along with relative normal blood pressure.  no suggestion of aneurysmal dilation or significant lesions in SMA or carotids/subclavians.

## 2015-08-13 NOTE — Assessment & Plan Note (Signed)
Currently being followed by pulmonary medicine. He has PFTs and 6 minute walk pending. Not currently on any medications however. No active wheezing  Defer to Dr. Stevenson Clinch

## 2015-08-30 ENCOUNTER — Telehealth: Payer: Self-pay | Admitting: Family Medicine

## 2015-08-30 ENCOUNTER — Encounter: Payer: Medicare Other | Admitting: Family Medicine

## 2015-08-30 NOTE — Telephone Encounter (Signed)
Please contact patient He saw pulmonologist in late July and he commented that the patient was supposed to see ENT for the lymph node in his neck; I was under the impression that he was going to have that node looked at Please see if he has already seen the ENT doctor; if so, please request records; if not, please do whatever is needed to get that ENT appointment to happen and get this evaluated Thank you

## 2015-08-31 NOTE — Telephone Encounter (Signed)
Pt states he saw someone forgot the name? But they stated it was nothing concerning?

## 2015-08-31 NOTE — Telephone Encounter (Signed)
I need records please, need to f/u on this; this is important

## 2015-09-02 NOTE — Telephone Encounter (Signed)
Thank you :)

## 2015-09-02 NOTE — Telephone Encounter (Signed)
Pt was seen at [facility] I called and requested notes

## 2015-09-17 ENCOUNTER — Ambulatory Visit (INDEPENDENT_AMBULATORY_CARE_PROVIDER_SITE_OTHER): Payer: Medicare Other | Admitting: *Deleted

## 2015-09-17 DIAGNOSIS — J432 Centrilobular emphysema: Secondary | ICD-10-CM

## 2015-09-17 LAB — PULMONARY FUNCTION TEST
DL/VA % pred: 85 %
DL/VA: 3.87 ml/min/mmHg/L
DLCO UNC: 18.35 ml/min/mmHg
DLCO unc % pred: 59 %
FEF 25-75 POST: 0.9 L/s
FEF 25-75 Pre: 1.54 L/sec
FEF2575-%CHANGE-POST: -41 %
FEF2575-%PRED-POST: 36 %
FEF2575-%PRED-PRE: 62 %
FEV1-%Change-Post: -22 %
FEV1-%PRED-POST: 60 %
FEV1-%PRED-PRE: 78 %
FEV1-POST: 1.69 L
FEV1-PRE: 2.19 L
FEV1FVC-%CHANGE-POST: -19 %
FEV1FVC-%Pred-Pre: 90 %
FEV6-%CHANGE-POST: -4 %
FEV6-%Pred-Post: 82 %
FEV6-%Pred-Pre: 86 %
FEV6-PRE: 3.06 L
FEV6-Post: 2.93 L
FEV6FVC-%PRED-PRE: 105 %
FEV6FVC-%Pred-Post: 105 %
FVC-%Change-Post: -3 %
FVC-%Pred-Post: 82 %
FVC-%Pred-Pre: 85 %
FVC-Post: 3.04 L
FVC-Pre: 3.16 L
POST FEV1/FVC RATIO: 56 %
PRE FEV1/FVC RATIO: 69 %
Post FEV6/FVC ratio: 100 %
Pre FEV6/FVC Ratio: 100 %

## 2015-09-17 NOTE — Progress Notes (Signed)
PFT performed today with Nitrogen washout. 

## 2015-09-17 NOTE — Progress Notes (Signed)
SMW performed today. 

## 2015-09-20 ENCOUNTER — Ambulatory Visit: Payer: Medicare Other | Admitting: Internal Medicine

## 2015-09-24 ENCOUNTER — Ambulatory Visit (INDEPENDENT_AMBULATORY_CARE_PROVIDER_SITE_OTHER): Payer: Medicare Other | Admitting: Internal Medicine

## 2015-09-24 ENCOUNTER — Encounter: Payer: Self-pay | Admitting: Internal Medicine

## 2015-09-24 ENCOUNTER — Ambulatory Visit: Payer: Medicare Other | Admitting: Internal Medicine

## 2015-09-24 VITALS — BP 130/66 | HR 96 | Ht 69.5 in | Wt 123.6 lb

## 2015-09-24 DIAGNOSIS — R911 Solitary pulmonary nodule: Secondary | ICD-10-CM | POA: Diagnosis not present

## 2015-09-24 DIAGNOSIS — F17210 Nicotine dependence, cigarettes, uncomplicated: Secondary | ICD-10-CM | POA: Diagnosis not present

## 2015-09-24 DIAGNOSIS — J432 Centrilobular emphysema: Secondary | ICD-10-CM | POA: Diagnosis not present

## 2015-09-24 MED ORDER — FLUTICASONE FUROATE-VILANTEROL 100-25 MCG/INH IN AEPB: 1.0000 | INHALATION_SPRAY | Freq: Every day | RESPIRATORY_TRACT | 0 refills | Status: AC

## 2015-09-24 NOTE — Assessment & Plan Note (Signed)
Tobacco Cessation - Counseling regarding benefits of smoking cessation strategies was provided for more than 3 min. - Educated that at this time smoking- cessation represents the single most important step that patient can take to enhance the length and quality of live. - Educated patient regarding alternatives of behavior interventions, pharmacotherapy including NRT and non-nicotine therapy such, and combinations of both. - Patient at this time is ready to quit, he is down to 1 cig per day, and bought nicotine patches yesterday, will start using them today.

## 2015-09-24 NOTE — Patient Instructions (Addendum)
Follow up with Dr. Stevenson Clinch in:2 months - trial of BreoEllipta (100/25) - 1 puff daily. -gargle and rinse after each use. We will give you a one month sample, if after 2 weeks you have improvement then call us back for a prescription - cont with your nicotine patches - avoid all forms of smoking - 2nd hand, ecigs, vapors, etc.  - we will see you after your repeat CT Chest scan in November

## 2015-09-24 NOTE — Progress Notes (Signed)
[facility] Consultation      MRN# [id number] Brent [name] 1945-12-30   CC: Chief Complaint  Patient presents with  . Follow-up    6wk rov. CT/SMW/PFT results. breathing is doing well. pt c/o sob w/exertion. 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 Mild sob with intense walking Overall no decline in health.  Patient had a pulmonary function test along with 6 minute walk test performed.   Current Outpatient Prescriptions:  .  acetaminophen (TYLENOL) 325 MG tablet, Take 1 tablet by mouth every 6 (six) hours as needed., Disp: , Rfl:  .  aspirin EC 81 MG EC tablet, Take 1 tablet (81 mg total) by mouth daily., Disp: , Rfl:  .  gabapentin (NEURONTIN) 100 MG capsule, Take 1 capsule by mouth 3 (three) times daily., Disp: , Rfl:    Review of Systems  Constitutional: Negative for chills and fever.  Eyes: Negative for blurred vision.  Respiratory: Positive for cough, sputum production and shortness of breath. Negative for hemoptysis and wheezing.   Cardiovascular: Negative for chest pain.  Gastrointestinal: Negative for heartburn.  Genitourinary: Negative for dysuria.  Musculoskeletal: Negative for myalgias.  Skin: Negative for rash.  Neurological: Negative for headaches.      Allergies:  Pneumococcal vaccine and Prevnar [pneumococcal 13-val conj vacc]  Physical Examination:  VS: BP 130/66 (BP Location: Left Arm, Cuff Size: Normal)   Pulse 96   Ht 5' 9.5" (1.765 m)   Wt 123 lb 9.6 oz (56.1 kg)   SpO2 100%   BMI 17.99 kg/m   General Appearance: No distress  HEENT: PERRLA, no ptosis, no other lesions noticed Pulmonary:normal breath sounds.,  diaphragmatic excursion normal.No wheezing, No rales   Cardiovascular:  Normal S1,S2.  No m/r/g.     Abdomen:Exam: Benign, Soft, non-tender, No masses  Skin:   warm, no rashes, no ecchymosis  Extremities: normal, no cyanosis, clubbing, warm with normal capillary refill.     Pulmonary function test 09/16/68 FEV1 70% FEV1/FVC 69% RV 60% TLC 60% RV/TLC 103% ERV 266 DLCO 59% Impression: Mild obstruction, with reduced lung volumes, no significant response to bronchodilators. Mild decrease lung volumes could be due to emphysema or neuromuscular disease. Moderate decrease in DLCO, flow loops with end expiratory scooping consistent with obstructive process.  6 minute walk test: 1024 feet/312 m, low saturation 92%, highest heart rate 82.     Assessment and Plan: Cigarette smoker one half pack a day or less Tobacco Cessation - Counseling regarding benefits of smoking cessation strategies was provided for more than 3 min. - Educated that at this time smoking- cessation represents the single most important step that patient can take to enhance the length and quality of live. - Educated patient regarding alternatives of behavior interventions, pharmacotherapy including NRT and non-nicotine therapy such, and combinations of both. - Patient at this time is ready to quit, he is down to 1 cig per day, and bought nicotine patches yesterday, will start using them today.     Centrilobular emphysema (HCC) CT chest with findings consistent with emphysema along with biapical scarring. Pulmonary function test with mild to moderate obstructive process. Tobacco counseling provided today Today we also discussed a trial of preop  patient is in agreement with  MMRC: 1 CAT score of 13  Plan: - trial of BreoEllipta (100/25) - 1 puff daily. -gargle and rinse after each use. We will give you a one month sample, if after 2 weeks you have improvement then call us back for a prescription - cont with your nicotine  patches - avoid all forms of smoking - 2nd hand, ecigs, vapors, etc.    Solitary pulmonary nodule Followup CT prior to next visit   Updated Medication List Outpatient Encounter Prescriptions as of 09/24/2015  Medication Sig  . acetaminophen (TYLENOL) 325 MG tablet Take 1 tablet by mouth every 6 (six) hours as needed.  Marland Kitchen aspirin EC 81 MG EC tablet Take 1 tablet (81 mg total) by mouth daily.  Marland Kitchen gabapentin (NEURONTIN) 100 MG capsule Take 1 capsule by mouth 3 (three) times daily.  . [EXPIRED] fluticasone furoate-vilanterol (BREO ELLIPTA) 100-25 MCG/INH AEPB Inhale 1 puff into the lungs daily.   No facility-administered encounter medications on file as of 09/24/2015.     Orders for this visit: No orders of the defined types were placed in this encounter.   Thank  you for the visitation and for allowing  [facility] to assist in the care of your patient. Our recommendations are noted above.  Please contact us if we can be of further service.  Vilinda Boehringer, MD [facility] Office Number: [phone]  Note: This note was prepared with Dragon dictation along with smaller phrase technology. Any transcriptional errors that result from this process are unintentional.

## 2015-09-24 NOTE — Progress Notes (Signed)
Patient ID: Brent [name], male   DOB: 10/10/1945, 70 y.o.   MRN: [id number] Patient seen in the office today and instructed on use of Breo Ellipta.  Patient expressed understanding and demonstrated technique.

## 2015-09-29 NOTE — Assessment & Plan Note (Signed)
Followup CT prior to next visit

## 2015-09-29 NOTE — Assessment & Plan Note (Signed)
CT chest with findings consistent with emphysema along with biapical scarring. Pulmonary function test with mild to moderate obstructive process. Tobacco counseling provided today Today we also discussed a trial of preop patient is in agreement with  MMRC: 1 CAT score of 13  Plan: - trial of BreoEllipta (100/25) - 1 puff daily. -gargle and rinse after each use. We will give you a one month sample, if after 2 weeks you have improvement then call us back for a prescription - cont with your nicotine patches - avoid all forms of smoking - 2nd hand, ecigs, vapors, etc.

## 2015-10-28 ENCOUNTER — Ambulatory Visit
Admission: RE | Admit: 2015-10-28 | Discharge: 2015-10-28 | Disposition: A | Discharge status: Home / Self Care | Payer: Medicare Other | Source: Ambulatory Visit | Attending: Internal Medicine | Admitting: Internal Medicine

## 2015-10-28 DIAGNOSIS — I700 Atherosclerosis of aorta: Secondary | ICD-10-CM | POA: Diagnosis not present

## 2015-10-28 DIAGNOSIS — R911 Solitary pulmonary nodule: Secondary | ICD-10-CM | POA: Insufficient documentation

## 2015-10-28 LAB — POCT I-STAT CREATININE: Creatinine, Ser: 0.8 mg/dL (ref 0.61–1.24)

## 2015-10-28 MED ORDER — IOPAMIDOL (ISOVUE-300) INJECTION 61%
75.0000 mL | Freq: Once | INTRAVENOUS | Status: AC | PRN
Start: 2015-10-28 — End: 2015-10-28
  Administered 2015-10-28: 75 mL via INTRAVENOUS

## 2015-11-29 ENCOUNTER — Encounter: Payer: Self-pay | Admitting: Internal Medicine

## 2015-11-29 ENCOUNTER — Ambulatory Visit (INDEPENDENT_AMBULATORY_CARE_PROVIDER_SITE_OTHER): Payer: Medicare Other | Admitting: Internal Medicine

## 2015-11-29 VITALS — BP 134/72 | HR 97 | Ht 69.5 in | Wt 124.0 lb

## 2015-11-29 DIAGNOSIS — Z720 Tobacco use: Secondary | ICD-10-CM

## 2015-11-29 DIAGNOSIS — F17210 Nicotine dependence, cigarettes, uncomplicated: Secondary | ICD-10-CM

## 2015-11-29 DIAGNOSIS — R911 Solitary pulmonary nodule: Secondary | ICD-10-CM

## 2015-11-29 DIAGNOSIS — J432 Centrilobular emphysema: Secondary | ICD-10-CM

## 2015-11-29 NOTE — Assessment & Plan Note (Signed)
Tobacco Cessation - Counseling regarding benefits of smoking cessation strategies was provided for more than 3 min. - Educated that at this time smoking- cessation represents the single most important step that patient can take to enhance the length and quality of live. - Educated patient regarding alternatives of behavior interventions, pharmacotherapy including NRT and non-nicotine therapy such, and combinations of both. - Patient at this time is ready to quit, he is down to 1 cig per day, and bought nicotine patches yesterday, will start using them today.
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
## 2015-11-29 NOTE — Patient Instructions (Signed)
Follow up with Dr. Alva Garnet in 6 months - Repeat CT chest w/o contast in 1 year for lung nodule - cont with diet and exercise - avoid smoking.

## 2015-11-29 NOTE — Assessment & Plan Note (Signed)
CT chest with findings consistent with emphysema along with biapical scarring. Pulmonary function test with mild to moderate obstructive process. Tobacco counseling provided today No clinical difference with Breo trial.   MMRC: 1 CAT score of 13  Plan: - cont with your nicotine patches - avoid all forms of smoking - 2nd hand, ecigs, vapors, etc.

## 2016-03-21 ENCOUNTER — Encounter: Payer: Medicare Other | Admitting: Family Medicine

## 2016-03-27 ENCOUNTER — Encounter: Payer: Self-pay | Admitting: Family Medicine

## 2016-03-27 ENCOUNTER — Ambulatory Visit (INDEPENDENT_AMBULATORY_CARE_PROVIDER_SITE_OTHER): Payer: Medicare Other | Admitting: Family Medicine

## 2016-03-27 VITALS — BP 136/70 | HR 92 | Temp 98.6°F | Resp 14 | Ht 68.0 in | Wt 123.0 lb

## 2016-03-27 DIAGNOSIS — R911 Solitary pulmonary nodule: Secondary | ICD-10-CM

## 2016-03-27 DIAGNOSIS — H269 Unspecified cataract: Secondary | ICD-10-CM

## 2016-03-27 DIAGNOSIS — R591 Generalized enlarged lymph nodes: Secondary | ICD-10-CM | POA: Diagnosis not present

## 2016-03-27 DIAGNOSIS — J432 Centrilobular emphysema: Secondary | ICD-10-CM

## 2016-03-27 DIAGNOSIS — Z1159 Encounter for screening for other viral diseases: Secondary | ICD-10-CM

## 2016-03-27 DIAGNOSIS — Z113 Encounter for screening for infections with a predominantly sexual mode of transmission: Secondary | ICD-10-CM

## 2016-03-27 DIAGNOSIS — Z0000 Encounter for general adult medical examination without abnormal findings: Secondary | ICD-10-CM | POA: Diagnosis not present

## 2016-03-27 DIAGNOSIS — Z125 Encounter for screening for malignant neoplasm of prostate: Secondary | ICD-10-CM

## 2016-03-27 DIAGNOSIS — I2510 Atherosclerotic heart disease of native coronary artery without angina pectoris: Secondary | ICD-10-CM | POA: Diagnosis not present

## 2016-03-27 DIAGNOSIS — Z720 Tobacco use: Secondary | ICD-10-CM

## 2016-03-27 DIAGNOSIS — R39198 Other difficulties with micturition: Secondary | ICD-10-CM | POA: Diagnosis not present

## 2016-03-27 DIAGNOSIS — R636 Underweight: Secondary | ICD-10-CM | POA: Diagnosis not present

## 2016-03-27 LAB — LIPID PANEL
Cholesterol: 162 mg/dL (ref <200)
HDL: 48 mg/dL (ref >40)
LDL CALC: 87 mg/dL (ref <100)
Total CHOL/HDL Ratio: 3.4 Ratio (ref <5.0)
Triglycerides: 134 mg/dL (ref <150)
VLDL: 27 mg/dL (ref <30)

## 2016-03-27 LAB — PSA: PSA: 0.4 ng/mL (ref <=4.0)

## 2016-03-27 LAB — CBC WITH DIFFERENTIAL/PLATELET
Basophils Absolute: 0 cells/uL (ref 0–200)
Basophils Relative: 0 %
EOS PCT: 2 %
Eosinophils Absolute: 100 cells/uL (ref 15–500)
HCT: 45.9 % (ref 38.5–50.0)
HEMOGLOBIN: 15 g/dL (ref 13.2–17.1)
LYMPHS ABS: 2000 {cells}/uL (ref 850–3900)
Lymphocytes Relative: 40 %
MCH: 31.3 pg (ref 27.0–33.0)
MCHC: 32.7 g/dL (ref 32.0–36.0)
MCV: 95.8 fL (ref 80.0–100.0)
MPV: 10 fL (ref 7.5–12.5)
Monocytes Absolute: 500 cells/uL (ref 200–950)
Monocytes Relative: 10 %
Neutro Abs: 2400 cells/uL (ref 1500–7800)
Neutrophils Relative %: 48 %
PLATELETS: 247 10*3/uL (ref 140–400)
RBC: 4.79 MIL/uL (ref 4.20–5.80)
RDW: 13 % (ref 11.0–15.0)
WBC: 5 10*3/uL (ref 3.8–10.8)

## 2016-03-27 LAB — COMPLETE METABOLIC PANEL WITH GFR
ALT: 30 U/L (ref 9–46)
AST: 34 U/L (ref 10–35)
Albumin: 4.5 g/dL (ref 3.6–5.1)
Alkaline Phosphatase: 64 U/L (ref 40–115)
BUN: 12 mg/dL (ref 7–25)
CHLORIDE: 101 mmol/L (ref 98–110)
CO2: 29 mmol/L (ref 20–31)
Calcium: 9.9 mg/dL (ref 8.6–10.3)
Creat: 0.75 mg/dL (ref 0.70–1.25)
GFR, Est African American: >89 mL/min (ref >=60)
GFR, Est Non African American: >89 mL/min (ref >=60)
GLUCOSE: 73 mg/dL (ref 65–99)
POTASSIUM: 5.2 mmol/L (ref 3.5–5.3)
SODIUM: 139 mmol/L (ref 135–146)
Total Bilirubin: 0.3 mg/dL (ref 0.2–1.2)
Total Protein: 7.1 g/dL (ref 6.1–8.1)

## 2016-03-27 LAB — TSH: TSH: 1.53 m[IU]/L (ref 0.40–4.50)

## 2016-03-27 NOTE — Assessment & Plan Note (Addendum)
Monitored by Dr. Pryor Ochoa, ENT and request notes; also saw pulmonologist who recommended biopsy

## 2016-03-27 NOTE — Assessment & Plan Note (Signed)
Not limiting activities 

## 2016-03-27 NOTE — Assessment & Plan Note (Signed)
Check DEXA 

## 2016-03-27 NOTE — Assessment & Plan Note (Signed)
Check retroperitoneal [place] to r/o AAA

## 2016-03-27 NOTE — Assessment & Plan Note (Signed)
Recommend yearly eye exams

## 2016-03-27 NOTE — Assessment & Plan Note (Signed)
Check labs 

## 2016-03-27 NOTE — Progress Notes (Signed)
Patient: Brent [name], Male    DOB: 1945/04/18, 71 y.o.   MRN: [id number]  Visit Date: 04/22/2016  Today's Provider: Enid Derry, MD   Chief Complaint  Patient presents with  . Annual Exam    Subjective:   Brent [name] is a 71 y.o. male who presents today for his Subsequent Annual Wellness Visit.  Caregiver input:  n/a  He has been seeing Dr. Pryor Ochoa about a lymph node; it has not gotten any bigger; sees him every six months, a few months last visit; he is feeling that and checking it; I asked about weight loss and night sweats; he is actually up a pound since last visit; 122 to 123 pounds; right now he feels better; sometimes having night sweats Hx of neck dissection for lymph node on the right; reviewed CT scan soft tissue neck, chest Next chest CT will be in October 2018 with Dr. Stevenson Clinch  USPSTF grade A and B recommendations Depression:  Depression screen [facility] 2/9 03/27/2016 07/19/2015 08/27/2014  Decreased Interest 0 0 0  Down, Depressed, Hopeless 0 2 0  PHQ - 2 Score 0 2 0  Altered sleeping - 3 -  Tired, decreased energy - 3 -  Change in appetite - 3 -  Feeling bad or failure about yourself  - 0 -  Trouble concentrating - 0 -  Moving slowly or fidgety/restless - 1 -  Suicidal thoughts - 0 -  PHQ-9 Score - 12 -  Difficult doing work/chores - Not difficult at all -   Hypertension: controlled Obesity: no; never weighed more than 130 pounds Alcohol: no Tobacco use: smoking very seldom; has plans for quitting; 2-3 cigs a week HIV, hep B, hep C: hep C ordered STD testing and prevention (chl/gon/syphilis): check, no symptoms Lipids: check today Glucose: check today Colorectal cancer: UTD Prostate cancer: PSA and refer to urologist; urine stream slow for 2-3 months Breast cancer: no lumps Lung cancer: due for Chest CT in [place] with Dr. Stevenson Clinch Osteoporosis: check DEXA AAA: order [place] Aspirin: taking aspirin Diet: fair diet Exercise: staying active Skin cancer:  two places on legs; present for a months; no bleeding  HPI  Review of Systems  Genitourinary: Positive for decreased urine volume and difficulty urinating.    Past Medical History:  Diagnosis Date  . Aortic calcification ([facility]) 07/2015   Noted on chest CT with contrast  . Cataract   . Centrilobular emphysema ([place]) 08/03/2015   Noted on chest CT July 2017  . 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[facility]) 07/29/2015  . Coronary artery calcification seen on CAT scan 07/29/2015   Noted on CT scan July 2017  . 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 History of colonic polyps   . Hypochloremia   . Right Supraclavicular Lymph node 08/03/2015  . Solitary pulmonary nodule 08/03/2015   CT Chest 07/2015 - Subpleural 3 mm left upper lobe pulmonary nodule, probably benign.  . Tobacco abuse      Past Surgical History:  Procedure Laterality Date  . COLONOSCOPY WITH PROPOFOL N/A 09/22/2014   Procedure: COLONOSCOPY WITH PROPOFOL;  Surgeon: Lucilla Lame, MD;  Location: [facility] ENDOSCOPY;  Service: Endoscopy;  Laterality: N/A;  . DEEP NECK LYMPH NODE BIOPSY / EXCISION Right   . HERNIA REPAIR  1988  . LYMPH NODE BIOPSY    . NM MYOVIEW ([place] [facility]X)  10/09/2013   Dr. Lujean Amel: EF 58%. Normal wall motion. No ischemia or infarction.  . shoulder    .  SHOULDER ARTHROSCOPY Right   . TRANSTHORACIC ECHOCARDIOGRAM  09/24/2013   EF 45-50% with mild global hypokinesis. GR 1 DD. No valvular lesions besides mild TR. Normal RV pressures.    Family History  Problem Relation Age of Onset  . Alcohol abuse Mother   . Parkinson's disease Other   . Stroke Other   . Diabetes Sister   . Heart disease Other   . Heart disease Other   . Cancer Paternal Grandfather     pancreatic  MD notes Niece had parkinson's 81 and niece had strokes 33 sisters had heart disease  Social History   Social History  . Marital status: Married    Spouse name: N/A  . Number of children: N/A  . Years of education:  N/A   Occupational History  . Not on file.   Social History Main Topics  . Smoking status: Current Some Day Smoker    Packs/day: 0.00    Years: 50.00    Types: Cigarettes  . Smokeless tobacco: Never Used     Comment: smokes 4 cig weekly.  . Alcohol use No  . Drug use: No  . Sexual activity: Yes   Other Topics Concern  . Not on file   Social History Narrative   Current tobacco user, 2-3 cigarettes per day for the past 4-5 months, previously 2 packs per day for 52 years, formerly worked as a Administrator, has dogs at home.    Outpatient Encounter Prescriptions as of 03/27/2016  Medication Sig Note  . acetaminophen (TYLENOL) 325 MG tablet Take 1 tablet by mouth every 6 (six) hours as needed. 10/01/2013: Received from: [facility]:   . aspirin EC 81 MG EC tablet Take 1 tablet (81 mg total) by mouth daily.   . fluticasone furoate-vilanterol (BREO ELLIPTA) 100-25 MCG/INH AEPB Inhale 1 puff into the lungs daily.   Marland Kitchen gabapentin (NEURONTIN) 100 MG capsule Take 1 capsule by mouth 3 (three) times daily. 10/01/2013: Received from: External Pharmacy Received Sig:    No facility-administered encounter medications on file as of 03/27/2016.     Functional Ability / Safety Screening 1.  Was the timed Get Up and Go test longer than 30 seconds?  no 2.  Does the patient need help with the phone, transportation, shopping,      preparing meals, housework, laundry, medications, or managing money?  no 3.  Does the patient's home have:  loose throw rugs in the hallway?   no      Grab bars in the bathroom? yes      Handrails on the stairs?   yes      Poor lighting?   no 4.  Has the patient noticed any hearing difficulties?   no  Fall Risk Assessment See under rooming  Depression Screen See under rooming Depression screen [facility] 2/9 03/27/2016 07/19/2015 08/27/2014  Decreased Interest 0 0 0  Down, Depressed, Hopeless 0 2 0  PHQ - 2 Score 0 2 0  Altered sleeping - 3 -   Tired, decreased energy - 3 -  Change in appetite - 3 -  Feeling bad or failure about yourself  - 0 -  Trouble concentrating - 0 -  Moving slowly or fidgety/restless - 1 -  Suicidal thoughts - 0 -  PHQ-9 Score - 12 -  Difficult doing work/chores - Not difficult at all -    Advanced Directives Does patient have a HCPOA?    no If yes, name and contact  information: wife would be HCPOA in our state Does patient have a living will or MOST form?  no  Objective:   Vitals: BP 136/70   Pulse 92   Temp 98.6 F (37 C) (Oral)   Resp 14   Ht 5\' 8"  (1.727 m)   Wt 123 lb (55.8 kg)   SpO2 98%   BMI 18.70 kg/m  Body mass index is 18.7 kg/m. No exam data present  Physical Exam Mood/affect:  Euthymic, pleasant Appearance:  Casually dressed, thin elderly male nad  Cognitive Testing - 6-CIT  Correct? Score   What year is it? yes 0 Yes = 0    No = 4  What month is it? yes 0 Yes = 0    No = 3  Remember:     [address], [place], [place]     What time is it? yes 0 Yes = 0    No = 3  Count backwards from 20 to 1 no 2 Correct = 0    1 error = 2   More than 1 error = 4  Say the months of the year in reverse. yes 0 Correct = 0    1 error = 2   More than 1 error = 4  What address did I ask you to remember? yes 0 Correct = 0  1 error = 2    2 error = 4    3 error = 6    4 error = 8    All wrong = 10       TOTAL SCORE  2/28   Interpretation:  Normal  Normal (0-7) Abnormal (8-28)    Assessment & Plan:     Annual Wellness Visit  Reviewed patient's Family Medical History Reviewed and updated list of patient's medical providers Assessment of cognitive impairment was done Assessed patient's functional ability Established a written schedule for health screening [place] Completed and Reviewed  Immunization History  Administered Date(s) Administered  . Pneumococcal Conjugate-13 07/25/2013  . Td 02/18/2003  . Tdap 01/09/2009    Health Maintenance  Topic  Date Due  . PNA vac Low Risk Adult (2 of 2 - PPSV23) 07/26/2014  . INFLUENZA VACCINE  08/09/2016  . COLONOSCOPY  09/21/2017  . TETANUS/TDAP  01/10/2019  . Hepatitis C Screening  Completed   Discussed health benefits of physical activity, and encouraged him to engage in regular exercise appropriate for his age and condition.   No orders of the defined types were placed in this encounter.   Current Outpatient Prescriptions:  .  acetaminophen (TYLENOL) 325 MG tablet, Take 1 tablet by mouth every 6 (six) hours as needed., Disp: , Rfl:  .  aspirin EC 81 MG EC tablet, Take 1 tablet (81 mg total) by mouth daily., Disp: , Rfl:  .  fluticasone furoate-vilanterol (BREO ELLIPTA) 100-25 MCG/INH AEPB, Inhale 1 puff into the lungs daily., Disp: , Rfl:  .  gabapentin (NEURONTIN) 100 MG capsule, Take 1 capsule by mouth 3 (three) times daily., Disp: , Rfl:  There are no discontinued medications.  Next Medicare Wellness Visit in 12+ months  Problem List Items Addressed This Visit      Cardiovascular and Mediastinum   Coronary artery calcification seen on CAT scan    See cardiologist; asymptomatic; check lipids today; taking aspirin      Relevant Orders   Lipid panel (Completed)     Respiratory   Centrilobular emphysema (HCC) (Chronic)  Not limiting activities        Immune and Lymphatic   Right Supraclavicular Lymph node    Monitored by Dr. Pryor Ochoa, ENT and request notes; also saw pulmonologist who recommended biopsy      Relevant Orders   CBC with Differential/Platelet (Completed)   COMPLETE METABOLIC PANEL WITH GFR (Completed)     Other   Underweight    Check DEXA      Relevant Orders   TSH (Completed)   DG Bone Density   Tobacco abuse (Chronic)    Check retroperitoneal [place] to r/o AAA      Relevant Orders   VAS US [facility] MEDICARE SCREEN   Screen for STD (sexually transmitted disease)    Check labs      Relevant Orders   HIV antibody (Completed)   RPR (Completed)    GC/Chlamydia Probe Amp (Completed)   Prostate cancer screening    Check PSA today; refer to urologist      Relevant Orders   PSA (Completed)   Preventative health care - Primary    USPSTF grade A and B recommendations reviewed with patient; age-appropriate recommendations, preventive care, screening tests, etc discussed and encouraged; healthy living encouraged; see AVS for patient education given to patient       Incidental lung nodule, > 45mm and < 28mm    Due for rescan October 2018      Cataract    Recommend yearly eye exams       Other Visit Diagnoses    Encounter for hepatitis C screening test for low risk patient       Relevant Orders   Hepatitis C Antibody (Completed)   Decreased urine stream       Relevant Orders   Ambulatory referral to Urology

## 2016-03-27 NOTE — Assessment & Plan Note (Signed)
See cardiologist; asymptomatic; check lipids today; taking aspirin

## 2016-03-27 NOTE — Assessment & Plan Note (Signed)
USPSTF grade A and B recommendations reviewed with patient; age-appropriate recommendations, preventive care, screening tests, etc discussed and encouraged; healthy living encouraged; see AVS for patient education given to patient  

## 2016-03-27 NOTE — Assessment & Plan Note (Signed)
Check PSA today; refer to urologist

## 2016-03-27 NOTE — Patient Instructions (Addendum)
Try to limit saturated fats in your diet (bologna, hot dogs, barbeque, cheeseburgers, hamburgers, steak, bacon, sausage, cheese, etc.) and get more fresh fruits, vegetables, and whole grains Request last office note from Dr. Pryor Ochoa as well as any imaging studies he has done Dr. Stevenson Clinch believed the place on the right side should be biopsied, so please contact Dr. Pryor Ochoa about this this week to see if that's been done or needs to be scheduled Let's get labs today We'll have you see the urologist I will suggest saw [place] for prostate health I do encourage you to quit smoking Call [phone] to sign up for smoking cessation classes You can call 1-800-QUIT-NOW to talk with a smoking cessation coach   Health Maintenance, Male A healthy lifestyle and preventive care is important for your health and wellness. Ask your health care provider about what schedule of regular examinations is right for you. What should I know about weight and diet?  Eat a Healthy Diet  Eat plenty of vegetables, fruits, whole grains, low-fat dairy products, and lean protein.  Do not eat a lot of foods high in solid fats, added sugars, or salt. Maintain a Healthy Weight  Regular exercise can help you achieve or maintain a healthy weight. You should:  Do at least 150 minutes of exercise each week. The exercise should increase your heart rate and make you sweat (moderate-intensity exercise).  Do strength-training exercises at least twice a week. Watch Your Levels of Cholesterol and Blood Lipids  Have your blood tested for lipids and cholesterol every 5 years starting at 70 years of age. If you are at high risk for heart disease, you should start having your blood tested when you are 70 years old. You may need to have your cholesterol levels checked more often if:  Your lipid or cholesterol levels are high.  You are older than 70 years of age.  You are at high risk for heart disease. What should I know about cancer  screening? Many types of cancers can be detected early and may often be prevented. Lung Cancer  You should be screened every year for lung cancer if:  You are a current smoker who has smoked for at least 30 years.  You are a former smoker who has quit within the past 15 years.  Talk to your health care provider about your screening options, when you should start screening, and how often you should be screened. Colorectal Cancer  Routine colorectal cancer screening usually begins at 70 years of age and should be repeated every 5-10 years until you are 70 years old. You may need to be screened more often if early forms of precancerous polyps or small growths are found. Your health care provider may recommend screening at an earlier age if you have risk factors for colon cancer.  Your health care provider may recommend using home test kits to check for hidden blood in the stool.  A small camera at the end of a tube can be used to examine your colon (sigmoidoscopy or colonoscopy). This checks for the earliest forms of colorectal cancer. Prostate and Testicular Cancer  Depending on your age and overall health, your health care provider may do certain tests to screen for prostate and testicular cancer.  Talk to your health care provider about any symptoms or concerns you have about testicular or prostate cancer. Skin Cancer  Check your skin from head to toe regularly.  Tell your health care provider about any new moles or changes  in moles, especially if:  There is a change in a mole's size, shape, or color.  You have a mole that is larger than a pencil eraser.  Always use sunscreen. Apply sunscreen liberally and repeat throughout the day.  Protect yourself by wearing long sleeves, pants, a wide-brimmed hat, and sunglasses when outside. What should I know about heart disease, diabetes, and high blood pressure?  If you are 90-31 years of age, have your blood pressure checked every 3-5  years. If you are 2 years of age or older, have your blood pressure checked every year. You should have your blood pressure measured twice-once when you are at a hospital or clinic, and once when you are not at a hospital or clinic. Record the average of the two measurements. To check your blood pressure when you are not at a hospital or clinic, you can use:  An automated blood pressure machine at a pharmacy.  A home blood pressure monitor.  Talk to your health care provider about your target blood pressure.  If you are between 59-63 years old, ask your health care provider if you should take aspirin to prevent heart disease.  Have regular diabetes screenings by checking your fasting blood sugar level.  If you are at a normal weight and have a low risk for diabetes, have this test once every three years after the age of 87.  If you are overweight and have a high risk for diabetes, consider being tested at a younger age or more often.  A one-time screening for abdominal aortic aneurysm (AAA) by ultrasound is recommended for men aged 70-75 years who are current or former smokers. What should I know about preventing infection? Hepatitis B  If you have a higher risk for hepatitis B, you should be screened for this virus. Talk with your health care provider to find out if you are at risk for hepatitis B infection. Hepatitis C  Blood testing is recommended for:  Everyone born from [birth year] through 1964.  Anyone with known risk factors for hepatitis C. Sexually Transmitted Diseases (STDs)  You should be screened each year for STDs including gonorrhea and chlamydia if:  You are sexually active and are younger than 70 years of age.  You are older than 70 years of age and your health care provider tells you that you are at risk for this type of infection.  Your sexual activity has changed since you were last screened and you are at an increased risk for chlamydia or gonorrhea. Ask your health  care provider if you are at risk.  Talk with your health care provider about whether you are at high risk of being infected with HIV. Your health care provider may recommend a prescription medicine to help prevent HIV infection. What else can I do?  Schedule regular health, dental, and eye exams.  Stay current with your vaccines (immunizations).  Do not use any tobacco products, such as cigarettes, chewing tobacco, and e-cigarettes. If you need help quitting, ask your health care provider.  Limit alcohol intake to no more than 2 drinks per day. One drink equals 12 ounces of beer, 5 ounces of wine, or 1 ounces of hard liquor.  Do not use street drugs.  Do not share needles.  Ask your health care provider for help if you need support or information about quitting drugs.  Tell your health care provider if you often feel depressed.  Tell your health care provider if you have ever been abused  or do not feel safe at home. This information is not intended to replace advice given to you by your health care provider. Make sure you discuss any questions you have with your health care provider. Document Released: 06/24/2007 Document Revised: 08/25/2015 Document Reviewed: 09/29/2014 Elsevier Interactive Patient Education  2017 [employer].

## 2016-03-27 NOTE — Assessment & Plan Note (Signed)
Due for rescan October 2018

## 2016-03-28 LAB — HEPATITIS C ANTIBODY: HCV AB: NEGATIVE

## 2016-03-28 LAB — GC/CHLAMYDIA PROBE AMP
CT Probe RNA: NOT DETECTED
GC PROBE AMP APTIMA: NOT DETECTED

## 2016-03-28 LAB — HIV ANTIBODY (ROUTINE TESTING W REFLEX): HIV 1&2 Ab, 4th Generation: NONREACTIVE

## 2016-03-28 LAB — RPR

## 2016-04-03 ENCOUNTER — Encounter: Payer: Self-pay | Admitting: Family Medicine

## 2016-04-03 DIAGNOSIS — R531 Weakness: Secondary | ICD-10-CM | POA: Insufficient documentation

## 2016-05-03 ENCOUNTER — Telehealth: Payer: Self-pay | Admitting: Family Medicine

## 2016-05-03 NOTE — Telephone Encounter (Signed)
Please follow-up with patient on two outstanding radiology orders He can schedule the DEXA scan himself if you'll give him the number please Check into the carotid scan and get ordered Thank you

## 2016-05-04 NOTE — Telephone Encounter (Signed)
Left pt a detail voicemail

## 2016-05-05 ENCOUNTER — Telehealth: Payer: Self-pay

## 2016-05-05 NOTE — Telephone Encounter (Signed)
Pt called asking if we had an appointment set up for urologist. Mention to pt that it was set up and gave him the date and time. Also gave him the number to set up his bone dex scan.

## 2016-05-17 ENCOUNTER — Ambulatory Visit (INDEPENDENT_AMBULATORY_CARE_PROVIDER_SITE_OTHER): Payer: Medicare Other | Admitting: Urology

## 2016-05-17 ENCOUNTER — Encounter: Payer: Self-pay | Admitting: Urology

## 2016-05-17 VITALS — BP 112/63 | HR 83 | Ht 68.0 in | Wt 122.7 lb

## 2016-05-17 DIAGNOSIS — N138 Other obstructive and reflux uropathy: Secondary | ICD-10-CM | POA: Diagnosis not present

## 2016-05-17 DIAGNOSIS — R3912 Poor urinary stream: Secondary | ICD-10-CM | POA: Diagnosis not present

## 2016-05-17 DIAGNOSIS — N401 Enlarged prostate with lower urinary tract symptoms: Secondary | ICD-10-CM | POA: Diagnosis not present

## 2016-05-17 LAB — URINALYSIS, COMPLETE
BILIRUBIN UA: NEGATIVE
Glucose, UA: NEGATIVE
Ketones, UA: NEGATIVE
Leukocytes, UA: NEGATIVE
Nitrite, UA: NEGATIVE
PH UA: 6 (ref 5.0–7.5)
Protein, UA: NEGATIVE
RBC, UA: NEGATIVE
Specific Gravity, UA: 1.015 (ref 1.005–1.030)
UUROB: 1 mg/dL (ref 0.2–1.0)

## 2016-05-17 LAB — BLADDER SCAN AMB NON-IMAGING: SCAN RESULT: 0

## 2016-05-17 NOTE — Progress Notes (Signed)
05/17/2016 10:54 AM   Brent [name] [birth year] [id number]  Referring provider: Arnetha Courser, MD [address] [place], [place] [address]  Chief Complaint  Patient presents with  . New Patient (Initial Visit)    weak urinary stream    HPI: 71 yo AAM referred for occasional weak stream. No straining to void. No frequency except after coffee in AM. Noc x 0. No urgency. No gross hematuria or dysuria. No BPH or GU surgery. Remote h/o STI (in his "young days"). Symptoms are stable. No associated signs or symptoms or other aggravating or alleviating factors. 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  PVR is 0 mL. His March 2018 PSA was 0.4.    PMH: Past Medical History:  Diagnosis Date  . Aortic calcification (HCC) 07/2015   Noted on chest CT with contrast  . Cataract   . Centrilobular emphysema ([place]) 08/03/2015   Noted on chest CT July 2017  . 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[place]) 07/29/2015  . Coronary artery calcification seen on CAT scan 07/29/2015   Noted on CT scan July 2017  . 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 History of colonic polyps   . Hypochloremia   . Right Supraclavicular Lymph node 08/03/2015  . Solitary pulmonary nodule 08/03/2015   CT Chest 07/2015 - Subpleural 3 mm left upper lobe pulmonary nodule, probably benign.  . Tobacco abuse     Surgical History: Past Surgical History:  Procedure Laterality Date  . COLONOSCOPY WITH PROPOFOL N/A 09/22/2014   Procedure: COLONOSCOPY WITH PROPOFOL;  Surgeon: Lucilla Lame, MD;  Location: [facility] ENDOSCOPY;  Service: Endoscopy;  Laterality: N/A;  . DEEP NECK LYMPH NODE BIOPSY / EXCISION Right   . HERNIA REPAIR  1988  . LYMPH NODE BIOPSY    . NM MYOVIEW ([facility])  10/09/2013   Dr. Lujean Amel: EF 58%. Normal wall motion. No ischemia or infarction.  . shoulder    . SHOULDER ARTHROSCOPY Right   . TRANSTHORACIC ECHOCARDIOGRAM  09/24/2013   EF 45-50% with mild global hypokinesis. GR 1 DD. No valvular lesions besides mild TR. Normal RV pressures.    Home Medications:  Allergies as of 05/17/2016      Reactions   Pneumococcal Vaccine Other (See Comments)   GUILLAIN BARRE SYNDROME NUMBNESS, SOB,CHEST PAIN   Prevnar [pneumococcal 13-val Conj Vacc] Other (See Comments)   GUILLAIN-BARRE SYNDROME      Medication List       Accurate as of 05/17/16 10:54 AM. Always use your most recent med list.          acetaminophen 325 MG tablet Commonly known as:  TYLENOL Take 1 tablet by mouth every 6 (six) hours as needed.   aspirin 81 MG EC tablet Take 1 tablet (81 mg total) by mouth daily.   fluticasone furoate-vilanterol 100-25 MCG/INH Aepb Commonly known as:  BREO ELLIPTA Inhale 1 puff into the lungs daily.   gabapentin 100 MG capsule Commonly known as:  NEURONTIN Take 1 capsule by mouth 3 (three) times daily.       Allergies:  Allergies  Allergen Reactions  . Pneumococcal Vaccine Other (See Comments)    GUILLAIN BARRE SYNDROME NUMBNESS, SOB,CHEST PAIN   . Prevnar [Pneumococcal 13-Val Conj Vacc] Other (See Comments)    GUILLAIN-BARRE SYNDROME    Family History: Family History  Problem Relation Age of Onset  . Alcohol abuse Mother   . Parkinson's  disease Other   . Stroke Other   . Diabetes Sister   . Heart disease Other   . Heart disease Other   . Cancer Paternal Grandfather     pancreatic  . Bladder Cancer Neg Hx   . Kidney cancer Neg Hx     Social History:  reports that he has been smoking Cigarettes.  He has been smoking about 0.00 packs per day for the past 50.00 years. He has never used smokeless tobacco. He reports that he does not drink alcohol or use drugs.  ROS: UROLOGY Frequent Urination?: No Hard to postpone urination?: No Burning/pain with urination?: Yes Get up at night to urinate?: No Leakage of urine?: No Urine stream starts and stops?: Yes Trouble starting stream?: No Do you have to  strain to urinate?: No Blood in urine?: No Urinary tract infection?: No Sexually transmitted disease?: No Injury to kidneys or bladder?: No Painful intercourse?: No Weak stream?: No Erection problems?: No Penile pain?: No  Gastrointestinal Nausea?: No Vomiting?: No Indigestion/heartburn?: No Diarrhea?: No Constipation?: No  Constitutional Fever: No Night sweats?: Yes Weight loss?: Yes Fatigue?: Yes  Skin Skin rash/lesions?: No Itching?: Yes  Eyes Blurred vision?: No Double vision?: No  Ears/Nose/Throat Sore throat?: No Sinus problems?: No  Hematologic/Lymphatic Swollen glands?: Yes Easy bruising?: Yes  Cardiovascular Leg swelling?: No Chest pain?: No  Respiratory Cough?: No Shortness of breath?: Yes  Endocrine Excessive thirst?: No  Musculoskeletal Back pain?: No Joint pain?: Yes  Neurological Headaches?: No Dizziness?: No  Psychologic Depression?: No Anxiety?: No  Physical Exam: There were no vitals taken for this visit.  Constitutional:  Alert and oriented, No acute distress. HEENT: [place] AT, moist mucus membranes.  Trachea midline, no masses. Cardiovascular: No clubbing, cyanosis, or edema. Respiratory: Normal respiratory effort, no increased work of breathing. GI: Abdomen is soft, nontender, nondistended, no abdominal masses GU: No CVA tenderness.  DRE - prostate ~40 grams, smooth, no hard area or nodule Skin: No rashes, bruises or suspicious lesions. Lymph: No cervical or inguinal adenopathy. Neurologic: Grossly intact, no focal deficits, moving all 4 extremities. Psychiatric: Normal mood and affect.  Laboratory Data: Lab Results  Component Value Date   WBC 5.0 03/27/2016   HGB 15.0 03/27/2016   HCT 45.9 03/27/2016   MCV 95.8 03/27/2016   PLT 247 03/27/2016    Lab Results  Component Value Date   CREATININE 0.75 03/27/2016    Lab Results  Component Value Date   PSA 0.4 03/27/2016   PSA 0.43 07/19/2015    No results  found for: TESTOSTERONE  Lab Results  Component Value Date   HGBA1C 6.7 (H) 08/04/2013    Urinalysis No results found for: COLORURINE, APPEARANCEUR, LABSPEC, PHURINE, GLUCOSEU, HGBUR, BILIRUBINUR, KETONESUR, PROTEINUR, UROBILINOGEN, NITRITE, LEUKOCYTESUR Pertinent Imaging:  Assessment & Plan:    1. Weak urinary stream, BPH - discussed some options such as surveillance, alpha blockers or proceeding with cystoscopic eval. All questions answered. He's not bothered. PVR, UA, PSA and DRE nl, so we'll continue surveillance. He'll return in about 6 months for a symptom check and PVR.   - Urinalysis, Complete - Bladder Scan (Post Void Residual) in office   No Follow-up on file.  Festus Aloe, [facility] [address], [place], [place] [address] [phone]

## 2016-08-11 ENCOUNTER — Ambulatory Visit: Payer: Medicare Other | Admitting: Internal Medicine

## 2016-08-29 ENCOUNTER — Encounter: Payer: Self-pay | Admitting: Family Medicine

## 2016-08-29 ENCOUNTER — Ambulatory Visit
Admission: RE | Admit: 2016-08-29 | Discharge: 2016-08-29 | Disposition: A | Discharge status: Home / Self Care | Payer: Medicare Other | Source: Ambulatory Visit | Attending: Family Medicine | Admitting: Family Medicine

## 2016-08-29 DIAGNOSIS — R636 Underweight: Secondary | ICD-10-CM | POA: Diagnosis present

## 2016-08-29 DIAGNOSIS — M8589 Other specified disorders of bone density and structure, multiple sites: Secondary | ICD-10-CM | POA: Insufficient documentation

## 2016-08-29 DIAGNOSIS — M8580 Other specified disorders of bone density and structure, unspecified site: Secondary | ICD-10-CM

## 2016-08-29 HISTORY — DX: Other specified disorders of bone density and structure, unspecified site: M85.80

## 2016-09-14 ENCOUNTER — Encounter: Payer: Self-pay | Admitting: Pulmonary Disease

## 2016-09-14 ENCOUNTER — Ambulatory Visit
Admission: RE | Admit: 2016-09-14 | Discharge: 2016-09-14 | Disposition: A | Discharge status: Home / Self Care | Payer: Medicare Other | Source: Ambulatory Visit | Attending: Pulmonary Disease | Admitting: Pulmonary Disease

## 2016-09-14 ENCOUNTER — Ambulatory Visit (INDEPENDENT_AMBULATORY_CARE_PROVIDER_SITE_OTHER): Payer: Medicare Other | Admitting: Pulmonary Disease

## 2016-09-14 VITALS — BP 116/68 | HR 112 | Ht 68.0 in | Wt 124.0 lb

## 2016-09-14 DIAGNOSIS — J439 Emphysema, unspecified: Secondary | ICD-10-CM | POA: Diagnosis not present

## 2016-09-14 DIAGNOSIS — J449 Chronic obstructive pulmonary disease, unspecified: Secondary | ICD-10-CM | POA: Diagnosis not present

## 2016-09-14 DIAGNOSIS — R911 Solitary pulmonary nodule: Secondary | ICD-10-CM

## 2016-09-14 DIAGNOSIS — F17200 Nicotine dependence, unspecified, uncomplicated: Secondary | ICD-10-CM | POA: Diagnosis not present

## 2016-09-14 NOTE — Patient Instructions (Signed)
As long as you are having no symptoms of shortness of breath, you may remain off of your inhaler medications  I've encouraged to to quit smoking altogether as we discussed  Chest x-ray today. If there are any findings that are worrisome to me we will contact you  Follow-up as needed

## 2016-09-15 NOTE — Progress Notes (Signed)
PULMONARY CONSULT NOTE   PT PROFILE: 71 y.o. male former smoker, previously followed by Dr. Stevenson Clinch with a diagnosis of COPD and incidentally found, very small left lung nodule  DATA: 07/28/15 CT chest:  Mild emphysema, 3 mm subpleural L UL pulmonary nodule-probably benign 09/17/15 PFTs: Mild obstruction (FEV1 2.19 L - 78% predicted), mild restriction, mild reduction in DLCO which corrects to normal for alveolar volume 10/28/15 CT chest: No significant change 09/14/16 CXR: Mild hyperinflation. No acute cardiac or pulmonary findings  SUBJ:  Last seen by Dr. Stevenson Clinch 11/29/15. No major events that time. No new complaints. He is presently on no bronchodilator therapy. He denies shortness of breath, cough, chest pain, hemoptysis, weight loss, lower extremity edema, calf tenderness. He has essentially quit smoking but every now and then "sneaks one" from a friend.   Vitals:   09/14/16 1124  BP: 116/68  Pulse: (!) 112  SpO2: 99%  Weight: 56.2 kg (124 lb)  Height: 5\' 8"  (1.727 m)    EXAM:  Gen: NAD HEENT: WNL Neck: Supple without LAN or JVD Lungs: breath sounds mildly diminished without wheezes or other adventitious sounds Cardiovascular: Reg, no murmurs noted Abdomen: Soft, nontender, normal BS Ext: No clubbing, cyanosis, edema Neuro: Grossly intact  DATA:   BMP Latest Ref Rng & Units 03/27/2016 10/28/2015 07/19/2015  Glucose 65 - 99 mg/dL 73 - 68  BUN 7 - 25 mg/dL 12 - 15  Creatinine 0.70 - 1.25 mg/dL 0.75 0.80 0.80  Sodium 135 - 146 mmol/L 139 - 136  Potassium 3.5 - 5.3 mmol/L 5.2 - 5.2  Chloride 98 - 110 mmol/L 101 - 98  CO2 20 - 31 mmol/L 29 - 29  Calcium 8.6 - 10.3 mg/dL 9.9 - 9.7    CBC Latest Ref Rng & Units 03/27/2016 07/19/2015 08/12/2013  WBC 3.8 - 10.8 K/uL 5.0 5.0 6.7  Hemoglobin 13.2 - 17.1 g/dL 15.0 15.5 14.5  Hematocrit 38.5 - 50.0 % 45.9 45.8 40.2  Platelets 140 - 400 K/uL 247 229 234    CXR:  As above  IMPRESSION:     ICD-10-CM   1. COPD, mild ([place]) J44.9    2. Pulmonary emphysema, unspecified emphysema type ([place]) J43.9 DG Chest 2 View  3. Smoker F17.200   4. Solitary pulmonary nodule R91.1      PLAN:  His COPD is mild and asymptomatic. Although he previously felt that he had benefited from an ICS/LABA he is presently on no bronchodilator therapy without symptoms. Therefore it is okay for him to remain off of all inhaler therapy.  I emphasized the importance of complete abstinence from smoking.  With regard to the pulmonary nodule, there is no evidence of progression since first detected incidentally on CT scan in July 2017. I do not believe that there is any indication for further follow-up of this.  Follow-up with [place] as needed   Merton Border, MD PCCM service Mobile [phone] Pager [phone] 09/15/2016 4:26 PM

## 2016-11-17 ENCOUNTER — Encounter: Payer: Self-pay | Admitting: Urology

## 2016-11-17 ENCOUNTER — Ambulatory Visit (INDEPENDENT_AMBULATORY_CARE_PROVIDER_SITE_OTHER): Payer: Medicare Other | Admitting: Urology

## 2016-11-17 VITALS — BP 143/69 | HR 85 | Ht 69.0 in | Wt 124.0 lb

## 2016-11-17 DIAGNOSIS — N138 Other obstructive and reflux uropathy: Secondary | ICD-10-CM

## 2016-11-17 DIAGNOSIS — N401 Enlarged prostate with lower urinary tract symptoms: Secondary | ICD-10-CM

## 2016-11-17 DIAGNOSIS — R3912 Poor urinary stream: Secondary | ICD-10-CM

## 2016-11-17 LAB — URINALYSIS, COMPLETE
Bilirubin, UA: NEGATIVE
Glucose, UA: NEGATIVE
KETONES UA: NEGATIVE
LEUKOCYTES UA: NEGATIVE
NITRITE UA: NEGATIVE
PROTEIN UA: NEGATIVE
RBC UA: NEGATIVE
SPEC GRAV UA: 1.015 (ref 1.005–1.030)
Urobilinogen, Ur: 1 mg/dL (ref 0.2–1.0)
pH, UA: 5 (ref 5.0–7.5)

## 2016-11-17 LAB — BLADDER SCAN AMB NON-IMAGING: Scan Result: 45

## 2016-11-17 NOTE — Progress Notes (Signed)
11/17/2016 2:15 PM   Brent [name] [birth year] [id number]  Referring provider: Arnetha Courser, MD [address] [place], [place] [address]  Chief Complaint  Patient presents with  . Benign Prostatic Hypertrophy    HPI: The patient is a 71 year old male who presents for follow-up of an occasional weak stream. No straining to void. No frequency except after coffee in AM. Noc x 0. No urgency. No gross hematuria or dysuria. No BPH or GU surgery. No associated signs or symptoms or other aggravating or alleviating factors. 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  PVR is 45 cc today.  His March 2018 PSA was 0.4. DRE benign in March 2018.    Due to his weak stream, at his last visit possible treatment options were discussed which include surveillance, alpha blockers, and cystoscopic evaluation.  He was not bothered by these symptoms and shows active surveillance.  He returns today with his PVR remaining in the normal range with no further changes in symptoms.  He notes a great urinary quality of life with no complaints.   PMH: Past Medical History:  Diagnosis Date  . Aortic calcification (HCC) 07/2015   Noted on chest CT with contrast  . Cataract   . Centrilobular emphysema ([facility]) 08/03/2015   Noted on chest CT July 2017  . 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[facility]) 07/29/2015  . Coronary artery calcification seen on CAT scan 07/29/2015   Noted on CT scan July 2017  . 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 History of colonic polyps   . Hypochloremia   . Osteopenia 08/29/2016   August 2018; next scan August 2020  . Right Supraclavicular Lymph node 08/03/2015  . Solitary pulmonary nodule 08/03/2015   CT Chest 07/2015 - Subpleural 3 mm left upper lobe pulmonary nodule, probably benign.  . Tobacco abuse     Surgical History: Past Surgical History:  Procedure Laterality Date  . DEEP NECK LYMPH NODE BIOPSY /  EXCISION Right   . HERNIA REPAIR  1988  . LYMPH NODE BIOPSY    . NM MYOVIEW ([place] [facility])  10/09/2013   Dr. Lujean Amel: EF 58%. Normal wall motion. No ischemia or infarction.  . shoulder    . SHOULDER ARTHROSCOPY Right   . TRANSTHORACIC ECHOCARDIOGRAM  09/24/2013   EF 45-50% with mild global hypokinesis. GR 1 DD. No valvular lesions besides mild TR. Normal RV pressures.    Home Medications:  Allergies as of 11/17/2016      Reactions   Pneumococcal Vaccine Other (See Comments)   GUILLAIN BARRE SYNDROME NUMBNESS, SOB,CHEST PAIN   Prevnar [pneumococcal 13-val Conj Vacc] Other (See Comments)   GUILLAIN-BARRE SYNDROME      Medication List        Accurate as of 11/17/16  2:15 PM. Always use your most recent med list.          acetaminophen 325 MG tablet Commonly known as:  TYLENOL Take 1 tablet by mouth every 6 (six) hours as needed.   aspirin 81 MG EC tablet Take 1 tablet (81 mg total) by mouth daily.   gabapentin 100 MG capsule Commonly known as:  NEURONTIN Take 1 capsule by mouth 3 (three) times daily.       Allergies:  Allergies  Allergen Reactions  . Pneumococcal Vaccine Other (See Comments)    GUILLAIN BARRE SYNDROME NUMBNESS, SOB,CHEST PAIN   . Prevnar [Pneumococcal 13-Val Conj  Vacc] Other (See Comments)    GUILLAIN-BARRE SYNDROME    Family History: Family History  Problem Relation Age of Onset  . Alcohol abuse Mother   . Parkinson's disease Other   . Stroke Other   . Diabetes Sister   . Heart disease Other   . Heart disease Other   . Cancer Paternal Grandfather        pancreatic  . Bladder Cancer Neg Hx   . Kidney cancer Neg Hx     Social History:  reports that he has been smoking cigarettes.  He has a 12.50 pack-year smoking history. he has never used smokeless tobacco. He reports that he does not drink alcohol or use drugs.  ROS: UROLOGY Frequent Urination?: No Hard to postpone urination?: No Burning/pain with urination?: No Get up at  night to urinate?: No Leakage of urine?: No Urine stream starts and stops?: No Trouble starting stream?: No Do you have to strain to urinate?: No Blood in urine?: No Urinary tract infection?: No Sexually transmitted disease?: No Injury to kidneys or bladder?: No Painful intercourse?: No Weak stream?: No Erection problems?: No Penile pain?: No  Gastrointestinal Nausea?: No Vomiting?: No Indigestion/heartburn?: No Diarrhea?: No Constipation?: No  Constitutional Fever: No Night sweats?: Yes Weight loss?: Yes Fatigue?: No  Skin Skin rash/lesions?: No Itching?: No  Eyes Blurred vision?: No Double vision?: No  Ears/Nose/Throat Sore throat?: No Sinus problems?: No  Hematologic/Lymphatic Swollen glands?: No Easy bruising?: No  Cardiovascular Leg swelling?: No Chest pain?: No  Respiratory Cough?: No Shortness of breath?: No  Endocrine Excessive thirst?: No  Musculoskeletal Back pain?: Yes Joint pain?: No  Neurological Headaches?: No Dizziness?: No  Psychologic Depression?: No Anxiety?: No  Physical Exam: BP (!) 143/69 (BP Location: Right Arm, Patient Position: Sitting, Cuff Size: Normal)   Pulse 85   Ht 5\' 9"  (1.753 m)   Wt 124 lb (56.2 kg)   BMI 18.31 kg/m   Constitutional:  Alert and oriented, No acute distress. HEENT: [place] AT, moist mucus membranes.  Trachea midline, no masses. Cardiovascular: No clubbing, cyanosis, or edema. Respiratory: Normal respiratory effort, no increased work of breathing. GI: Abdomen is soft, nontender, nondistended, no abdominal masses GU: No CVA tenderness.  Skin: No rashes, bruises or suspicious lesions. Lymph: No cervical or inguinal adenopathy. Neurologic: Grossly intact, no focal deficits, moving all 4 extremities. Psychiatric: Normal mood and affect.  Laboratory Data: Lab Results  Component Value Date   WBC 5.0 03/27/2016   HGB 15.0 03/27/2016   HCT 45.9 03/27/2016   MCV 95.8 03/27/2016   PLT 247  03/27/2016    Lab Results  Component Value Date   CREATININE 0.75 03/27/2016    Lab Results  Component Value Date   PSA 0.4 03/27/2016   PSA 0.43 07/19/2015    No results found for: TESTOSTERONE  Lab Results  Component Value Date   HGBA1C 6.7 (H) 08/04/2013    Urinalysis    Component Value Date/Time   APPEARANCEUR Clear 05/17/2016 1047   GLUCOSEU Negative 05/17/2016 1047   BILIRUBINUR Negative 05/17/2016 1047   PROTEINUR Negative 05/17/2016 1047   NITRITE Negative 05/17/2016 1047   LEUKOCYTESUR Negative 05/17/2016 1047    Assessment & Plan:    1. Weak urinary stream This is no longer bothersome to him.  No significant Neri complaints.  Follow-up as needed  2.  Prostate cancer screening I discussed with the patient that he is now over the age of 63 and prostate cancer screening is no longer  aggressively indicated.  He does not need any further PSAs.   Return if symptoms worsen or fail to improve.  Nickie Retort, MD  [facility] [address] [place], [place] [address] [phone]

## 2017-01-19 ENCOUNTER — Ambulatory Visit: Payer: Medicare Other | Admitting: Pulmonary Disease

## 2017-01-19 ENCOUNTER — Encounter: Payer: Self-pay | Admitting: Pulmonary Disease

## 2017-01-19 VITALS — BP 126/62 | HR 86 | Ht 69.0 in

## 2017-01-19 DIAGNOSIS — J449 Chronic obstructive pulmonary disease, unspecified: Secondary | ICD-10-CM

## 2017-01-19 DIAGNOSIS — F17200 Nicotine dependence, unspecified, uncomplicated: Secondary | ICD-10-CM

## 2017-01-19 DIAGNOSIS — R911 Solitary pulmonary nodule: Secondary | ICD-10-CM | POA: Diagnosis not present

## 2017-01-19 NOTE — Patient Instructions (Signed)
No changes made today.  Follow-up in 6-8 weeks with  repeat lung function tests (PFTs) and chest x-ray prior to that visit

## 2017-01-19 NOTE — Progress Notes (Signed)
PULMONARY OFFICE FOLLOW-UP NOTE   PT PROFILE: 72 y.o. male former smoker, previously followed by Dr. Stevenson Clinch with a diagnosis of COPD and incidentally found, very small left lung nodule  DATA: 07/28/15 CT chest:  Mild emphysema, 3 mm subpleural L UL pulmonary nodule-probably benign 09/17/15 PFTs: Mild obstruction (FEV1 2.19 L - 78% predicted), mild restriction, mild reduction in DLCO which corrects to normal for alveolar volume 10/28/15 CT chest: No significant change 09/14/16 CXR: Mild hyperinflation. No acute cardiac or pulmonary findings  INTERVAL: No major events  SUBJ:  Last seen September 2018 this is a routine reevaluation.No new complaints. He is presently on no inhaler therapy.  He denies limiting shortness of breath.  He is no longer smoking at all.Denies CP, fever, purulent sputum, hemoptysis, LE edema and calf tenderness   Vitals:   01/19/17 1049 01/19/17 1052  BP:  126/62  Pulse:  86  SpO2:  98%  Height: 5\' 9"  (1.753 m)     EXAM:  Gen: NAD HEENT: WNL Neck: Supple without LAN or JVD Lungs: breath sounds mildly diminished without wheezes Cardiovascular: Reg, no murmurs noted Abdomen: Soft, nontender, normal BS Ext: No clubbing, cyanosis, edema Neuro: Grossly intact  DATA:   BMP Latest Ref Rng & Units 03/27/2016 10/28/2015 07/19/2015  Glucose 65 - 99 mg/dL 73 - 68  BUN 7 - 25 mg/dL 12 - 15  Creatinine 0.70 - 1.25 mg/dL 0.75 0.80 0.80  Sodium 135 - 146 mmol/L 139 - 136  Potassium 3.5 - 5.3 mmol/L 5.2 - 5.2  Chloride 98 - 110 mmol/L 101 - 98  CO2 20 - 31 mmol/L 29 - 29  Calcium 8.6 - 10.3 mg/dL 9.9 - 9.7    CBC Latest Ref Rng & Units 03/27/2016 07/19/2015 08/12/2013  WBC 3.8 - 10.8 K/uL 5.0 5.0 6.7  Hemoglobin 13.2 - 17.1 g/dL 15.0 15.5 14.5  Hematocrit 38.5 - 50.0 % 45.9 45.8 40.2  Platelets 140 - 400 K/uL 247 229 234    CXR:  NNF  IMPRESSION:     ICD-10-CM   1. COPD, mild ([facility]) J44.9 DG Chest 2 View    Pulmonary Function Test [facility] Only  2. History of  lng nodule R91.1   3. Former Smoker F17.200 DG Chest 2 View    Pulmonary Function Test [facility] Only     PLAN:  No changes made today.   Follow-up in 6-8 weeks with  repeat lung function tests (PFTs) and chest x-ray prior to that visit    Merton Border, MD PCCM service Mobile [phone] Pager [phone] 01/27/2017 3:08 PM

## 2017-02-22 ENCOUNTER — Ambulatory Visit: Payer: Medicare Other

## 2017-02-27 ENCOUNTER — Ambulatory Visit: Payer: Medicare Other | Attending: Pulmonary Disease

## 2017-02-27 DIAGNOSIS — J449 Chronic obstructive pulmonary disease, unspecified: Secondary | ICD-10-CM | POA: Diagnosis not present

## 2017-02-27 DIAGNOSIS — F17200 Nicotine dependence, unspecified, uncomplicated: Secondary | ICD-10-CM | POA: Diagnosis present

## 2017-02-27 MED ORDER — ALBUTEROL SULFATE (2.5 MG/3ML) 0.083% IN NEBU
2.5000 mg | INHALATION_SOLUTION | Freq: Once | RESPIRATORY_TRACT | Status: DC
  Filled 2017-02-27: qty 3

## 2017-03-02 ENCOUNTER — Ambulatory Visit
Admission: RE | Admit: 2017-03-02 | Discharge: 2017-03-02 | Disposition: A | Discharge status: Home / Self Care | Payer: Medicare Other | Source: Ambulatory Visit | Attending: Pulmonary Disease | Admitting: Pulmonary Disease

## 2017-03-02 ENCOUNTER — Encounter: Payer: Self-pay | Admitting: Pulmonary Disease

## 2017-03-02 ENCOUNTER — Ambulatory Visit: Payer: Medicare Other | Admitting: Pulmonary Disease

## 2017-03-02 VITALS — BP 130/86 | HR 98 | Ht 69.0 in | Wt 120.0 lb

## 2017-03-02 DIAGNOSIS — R911 Solitary pulmonary nodule: Secondary | ICD-10-CM | POA: Insufficient documentation

## 2017-03-02 DIAGNOSIS — J449 Chronic obstructive pulmonary disease, unspecified: Secondary | ICD-10-CM

## 2017-03-02 DIAGNOSIS — J984 Other disorders of lung: Secondary | ICD-10-CM | POA: Diagnosis not present

## 2017-03-02 DIAGNOSIS — Z87891 Personal history of nicotine dependence: Secondary | ICD-10-CM

## 2017-03-02 MED ORDER — ALBUTEROL SULFATE HFA 108 (90 BASE) MCG/ACT IN AERS: 2.0000 | INHALATION_SPRAY | RESPIRATORY_TRACT | 6 refills | Status: DC | PRN

## 2017-03-02 NOTE — Patient Instructions (Signed)
Your lung function tests are nearly normal but probably show the earliest stages of COPD We discussed the importance of complete abstinence from cigarettes Chest x-ray ordered for today We will contact you if there are any significant findings on the chest x-ray Otherwise, follow-up as needed I have prescribed an albuterol inhaler to be used as needed for shortness of breath, cough, wheezing, chest tightness

## 2017-03-02 NOTE — Progress Notes (Signed)
PULMONARY OFFICE FOLLOW-UP NOTE   PT PROFILE: 72 y.o. male former smoker, previously followed by Dr. Stevenson Clinch with a diagnosis of COPD and incidentally-found very small left lung nodule  DATA: 07/28/15 CT chest:  Mild emphysema, 3 mm subpleural L UL pulmonary nodule-probably benign 09/17/15 PFTs: Mild obstruction (FEV1 2.19 L - 78% predicted), mild restriction, mild reduction in DLCO which corrects to normal for alveolar volume 10/28/15 CT chest: No significant change 09/14/16 CXR: Mild hyperinflation. No acute cardiac or pulmonary findings 02/27/17: No definite obstruction but flow volume curve suggests early small airways disease.  Lung volumes normal.  Diffusion capacity low normal. 03/02/17: No acute cardiac or pulmonary findings   INTERVAL: No major events   SUBJ:  This is a routine reevaluation.last seen 01/19/17 at which time we ordered pulmonary function tests in this follow-up.  He has no new complaints. He remains on no inhaler therapy.  He continues to deny shortness of breath.  He reports that he remains abstinent from cigarettes. Denies CP, fever, purulent sputum, hemoptysis, LE edema and calf tenderness   Vitals:   03/02/17 0934 03/02/17 0936  BP:  130/86  Pulse:  98  SpO2:  98%  Weight: 54.4 kg (120 lb)   Height: 5\' 9"  (1.753 m)     EXAM:  Gen: NAD HEENT: WNL Neck: Supple without LAN or JVD Lungs: No wheezes or other adventitious sounds Cardiovascular: Reg, no murmurs noted Abdomen: Soft, nontender, normal BS Ext: No clubbing, cyanosis, edema Neuro: Grossly intact on limited exam  DATA:   BMP Latest Ref Rng & Units 03/27/2016 10/28/2015 07/19/2015  Glucose 65 - 99 mg/dL 73 - 68  BUN 7 - 25 mg/dL 12 - 15  Creatinine 0.70 - 1.25 mg/dL 0.75 0.80 0.80  Sodium 135 - 146 mmol/L 139 - 136  Potassium 3.5 - 5.3 mmol/L 5.2 - 5.2  Chloride 98 - 110 mmol/L 101 - 98  CO2 20 - 31 mmol/L 29 - 29  Calcium 8.6 - 10.3 mg/dL 9.9 - 9.7    CBC Latest Ref Rng & Units  03/27/2016 07/19/2015 08/12/2013  WBC 3.8 - 10.8 K/uL 5.0 5.0 6.7  Hemoglobin 13.2 - 17.1 g/dL 15.0 15.5 14.5  Hematocrit 38.5 - 50.0 % 45.9 45.8 40.2  Platelets 140 - 400 K/uL 247 229 234    CXR: As noted above, no cardiac or pulmonary findings  IMPRESSION:   1) Smoker -presently abstinent for several weeks.  He is somewhat vague on how long it has been since he last smoked.  He seems to understand the importance of remaining abstinent 2) very mild COPD without limiting shortness of breath 3) history of very small lung nodule noted incidentally in [facility] on CT scan 10/28/15.  Current chest film reveals no evidence of pulmonary nodule.  Given apparent absence of growth, this is almost certainly a benign process and does not warrant any further evaluation.  PLAN:  We discussed the findings of early stage COPD. I encouraged continue abstinence from cigarettes. I did prescribe an albuterol inhaler that he may use as needed for SOB, cough, wheezing, chest tightness Follow-up as needed   Merton Border, MD PCCM service Mobile [phone] Pager [phone] 03/02/2017 9:56 AM

## 2017-05-10 ENCOUNTER — Ambulatory Visit (INDEPENDENT_AMBULATORY_CARE_PROVIDER_SITE_OTHER): Payer: Medicare Other

## 2017-05-10 VITALS — BP 110/50 | HR 80 | Temp 97.7°F | Resp 12 | Ht 69.0 in | Wt 120.5 lb

## 2017-05-10 DIAGNOSIS — Z0000 Encounter for general adult medical examination without abnormal findings: Secondary | ICD-10-CM | POA: Diagnosis not present

## 2017-05-10 DIAGNOSIS — R634 Abnormal weight loss: Secondary | ICD-10-CM | POA: Diagnosis not present

## 2017-05-10 DIAGNOSIS — Z658 Other specified problems related to psychosocial circumstances: Secondary | ICD-10-CM

## 2017-05-10 NOTE — Progress Notes (Signed)
Subjective:   Brent [name] is a 72 y.o. male who presents for Medicare Annual/Subsequent preventive examination.  Review of Systems:   N/A Cardiac Risk Factors include: advanced age (>34men, >95 women);male gender;smoking/ tobacco exposure     Objective:    Vitals: BP (!) 110/50 (BP Location: Left Arm, Patient Position: Sitting, Cuff Size: Normal)   Pulse 80   Temp 97.7 F (36.5 C) (Oral)   Resp 12   Ht 5\' 9"  (1.753 m)   Wt 120 lb 8 oz (54.7 kg)   SpO2 97%   BMI 17.79 kg/m   Body mass index is 17.79 kg/m.  Advanced Directives 05/10/2017 03/27/2016 07/19/2015 09/22/2014 10/01/2013 08/11/2013  Does Patient Have a Medical Advance Directive? No No No No No Patient does not have advance directive;Patient would not like information  Would patient like information on creating a medical advance directive? Yes (MAU/Ambulatory/Procedural Areas - Information given) - No - patient declined information - Yes - Educational materials given -  Pre-existing out of facility DNR order (yellow form or pink MOST form) - - - - - No    Tobacco Social History   Tobacco Use  Smoking Status Current Some Day Smoker  . Packs/day: 0.25  . Years: 50.00  . Pack years: 12.50  . Types: Cigarettes  Smokeless Tobacco Never Used  Tobacco Comment   smokes 4 cig weekly.     Ready to quit: Yes Counseling given: Yes Comment: smokes 4 cig weekly.  Clinical Intake:  Pre-visit preparation completed: Yes  Pain : No/denies pain   BMI - recorded: 17.79 Nutritional Status: BMI <19  Underweight Nutritional Risks: Unintentional weight loss(Referral placed for pt to be seen by nutritionist, scheduled for f/u with Dr. Sanda Klein, referred to Burgess Estelle for Lung Cancer screening) Diabetes: No  How often do you need to have someone help you when you read instructions, pamphlets, or other written materials from your doctor or pharmacy?: 1 - Never  Interpreter Needed?: No  Information entered by :: AE[place]sole,  LPN  Hospitalizations/ED visits and surgeries occurring within the previous 12 months:  Within the previous 12 months, pt has not underwent any surgical procedures, has not been hospitalized for any conditions and has not been treated by an emergency room clinician.  Past Medical History:  Diagnosis Date  . Aortic calcification (HCC) 07/2015   Noted on chest CT with contrast  . Cataract   . Centrilobular emphysema ([place]) 08/03/2015   Noted on chest CT July 2017  . 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[place]) 07/29/2015  . Coronary artery calcification seen on CAT scan 07/29/2015   Noted on CT scan July 2017  . 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 Guillain Barr syndrome ([place])   . History of colonic polyps   . Hypochloremia   . Osteopenia 08/29/2016   August 2018; next scan August 2020  . Right Supraclavicular Lymph node 08/03/2015  . Solitary pulmonary nodule 08/03/2015   CT Chest 07/2015 - Subpleural 3 mm left upper lobe pulmonary nodule, probably benign.  . Tobacco abuse    Past Surgical History:  Procedure Laterality Date  . COLONOSCOPY WITH PROPOFOL N/A 09/22/2014   Procedure: COLONOSCOPY WITH PROPOFOL;  Surgeon: Lucilla Lame, MD;  Location: [facility] ENDOSCOPY;  Service: Endoscopy;  Laterality: N/A;  . DEEP NECK LYMPH NODE BIOPSY / EXCISION Right   . HERNIA REPAIR  1988  . LYMPH NODE BIOPSY    . NM MYOVIEW ([place] HX)  10/09/2013  Dr. Lujean Amel: EF 58%. Normal wall motion. No ischemia or infarction.  . shoulder    . SHOULDER ARTHROSCOPY Right   . TRANSTHORACIC ECHOCARDIOGRAM  09/24/2013   EF 45-50% with mild global hypokinesis. GR 1 DD. No valvular lesions besides mild TR. Normal RV pressures.   Family History  Problem Relation Age of Onset  . Alcohol abuse Mother   . Healthy Daughter   . Parkinson's disease Other   . Stroke Other   . Diabetes Sister   . Heart disease Other   . Heart disease Other   . Alcohol abuse Brother   . Cancer Paternal Grandfather         pancreatic  . Heart disease Sister   . Heart disease Sister   . Healthy Son   . Healthy Daughter   . Bladder Cancer Neg Hx   . Kidney cancer Neg Hx    Social History   Socioeconomic History  . Marital status: Married    Spouse name: [name]  . Number of children: 3  . Years of education: some college  . Highest education level: 12th grade  Occupational History  . Occupation: Retired  Scientific laboratory technician  . Financial resource strain: Not hard at all  . Food insecurity:    Worry: Never true    Inability: Never true  . Transportation needs:    Medical: No    Non-medical: No  Tobacco Use  . Smoking status: Current Some Day Smoker    Packs/day: 0.25    Years: 50.00    Pack years: 12.50    Types: Cigarettes  . Smokeless tobacco: Never Used  . Tobacco comment: smokes 4 cig weekly.  Substance and Sexual Activity  . Alcohol use: No  . Drug use: No  . Sexual activity: Not Currently  Lifestyle  . Physical activity:    Days per week: 7 days    Minutes per session: 20 min  . Stress: Rather much  Relationships  . Social connections:    Talks on phone: Patient refused    Gets together: Patient refused    Attends religious service: Patient refused    Active member of club or organization: Patient refused    Attends meetings of clubs or organizations: Patient refused    Relationship status: Married  Other Topics Concern  . Not on file  Social History Narrative   Current tobacco user, 2-3 cigarettes per day for the past 4-5 months, previously 2 packs per day for 52 years, formerly worked as a Administrator, has dogs at home.    Outpatient Encounter Medications as of 05/10/2017  Medication Sig  . acetaminophen (TYLENOL) 325 MG tablet Take 1 tablet by mouth every 6 (six) hours as needed.  Marland Kitchen albuterol (PROVENTIL HFA;VENTOLIN HFA) 108 (90 Base) MCG/ACT inhaler Inhale 2 puffs into the lungs every 4 (four) hours as needed for wheezing or shortness of breath.  Marland Kitchen aspirin EC 81 MG EC  tablet Take 1 tablet (81 mg total) by mouth daily.  Marland Kitchen gabapentin (NEURONTIN) 100 MG capsule Take 1 capsule by mouth 3 (three) times daily.   No facility-administered encounter medications on file as of 05/10/2017.     Activities of Daily Living In your present state of health, do you have any difficulty performing the following activities: 05/10/2017  Hearing? N  Comment denies hearing aids  Vision? Y  Comment wears eyeglasses, cataracts  Difficulty concentrating or making decisions? Y  Comment short term memory loss  Walking or climbing stairs?  Y  Comment joint pain  Dressing or bathing? N  Doing errands, shopping? N  Preparing Food and eating ? N  Comment Full set upper and lower dentures  Using the Toilet? N  In the past six months, have you accidently leaked urine? N  Do you have problems with loss of bowel control? N  Managing your Medications? N  Managing your Finances? N  Housekeeping or managing your Housekeeping? N  Some recent data might be hidden    Patient Care Team: Lada, Satira Anis, MD as PCP - General (Family Medicine) [name], MD as Consulting Physician (Physical Medicine and Rehabilitation) Vladimir Crofts, MD (Neurology) Yolonda Kida, MD as Consulting Physician (Cardiology) Carloyn Manner, MD as Referring Physician (Otolaryngology) Wilhelmina Mcardle, MD as Consulting Physician (Pulmonary Disease)   Assessment:   This is a routine wellness examination for [place].  Exercise Activities and Dietary recommendations Current Exercise Habits: Home exercise routine, Type of exercise: walking, Time (Minutes): 20, Frequency (Times/Week): 7, Weekly Exercise (Minutes/Week): 140, Intensity: Mild, Exercise limited by: None identified  Goals    . DIET - INCREASE CALORIC INTAKE     Recommend to increase protein intake and to focus on eating 3 healthy meals and at least 2 healthy snacks per day.       Fall Risk Fall Risk  05/10/2017 03/27/2016 07/19/2015  08/27/2014  Falls in the past year? No No No No  Risk for fall due to : Impaired vision - - -  Risk for fall due to: Comment wears eyeglasses,cataracts - - -   Is the home free of loose throw rugs in walkways, pet beds, electrical cords, etc? Yes Adequate lighting to reduce risk of falls?  Yes In addition, does the patient have any of the following: Stairs in or around the home WITH handrails? Yes Use of a cane, walker or w/c? No Grab bars in the bathroom? Yes  Shower chair or a place to sit while bathing? Yes Use of an elevated toilet seat or a handicapped toilet? No  Timed Get Up and Go Performed: Yes. Pt ambulated 10 feet within 10 sec. Gait stead-fast and without the use of an assistive device. No intervention required at this time. Fall risk prevention has been discussed.  Pt declined my offer to send Community Resource Referral to Care Guide for an elevated toilet seat.  Depression Screen PHQ 2/9 Scores 05/10/2017 03/27/2016 07/19/2015 08/27/2014  PHQ - 2 Score 2 0 2 0  PHQ- 9 Score 10 - 12 -   Pt is currently taking care of his aunt and has been under a lot of stress. To help ease stress, a referral has been sent to [facility] to assist with [employer] that may be available for him and for his aunt.   Cognitive Function     6CIT Screen 05/10/2017  What Year? 0 points  What month? 0 points  What time? 3 points  Count back from 20 0 points  Months in reverse 0 points  Repeat phrase 0 points  Total Score 3    Immunization History  Administered Date(s) Administered  . Pneumococcal Conjugate-13 07/25/2013  . Td 02/18/2003  . Tdap 01/09/2009    Qualifies for Shingles Vaccine? No  Screening Tests Health Maintenance  Topic Date Due  . COLONOSCOPY  09/21/2017  . TETANUS/TDAP  01/10/2019  . Hepatitis C Screening  Completed  . INFLUENZA VACCINE  Discontinued  . PNA vac Low Risk Adult  Discontinued   Cancer  Screenings: Lung: Low Dose CT Chest recommended if Age 73-80  years, 30 pack-year currently smoking OR have quit w/in 15years. Pt does have a h/o pulmonary nodule and unintentional weight loss. Uncertain if pt will qualify for lung cancer screening. An Epic message has been sent to Burgess Estelle, RN (Oncology Nurse Navigator) regarding the possible need for this exam. Raquel Sarna will review the patient's chart to determine if the patient truly qualifies for the exam. If the patient qualifies, Raquel Sarna will order the Low Dose CT of the chest to facilitate the scheduling of this exam. Colorectal: Completed 09/22/14. Repeat every 3 years  Additional Screenings: Hepatitis C Screening: Completed 03/27/16     Plan:  I have personally reviewed and addressed the Medicare Annual Wellness questionnaire and have noted the following in the patient's chart:  A. Medical and social history B. Use of alcohol, tobacco or illicit drugs  C. Current medications and supplements D. Functional ability and status E.  Nutritional status F.  Physical activity G. Advance directives H. List of other physicians I.  Hospitalizations, surgeries, and ER visits in previous 12 months J.  [place] such as hearing and vision if needed, cognitive and depression L. Referrals and appointments  In addition, I have reviewed and discussed with patient certain preventive protocols, quality metrics, and best practice recommendations. A written personalized care plan for preventive services as well as general preventive health recommendations were provided to patient.  See attached scanned questionnaire for additional information.   Signed,  Aleatha Borer, LPN Nurse Health Advisor

## 2017-05-10 NOTE — Patient Instructions (Signed)
Brent [name] , Thank you for taking time to come for your Medicare Wellness Visit. I appreciate your ongoing commitment to your health goals. Please review the following plan we discussed and let me know if I can assist you in the future.   Screening recommendations/referrals: Colorectal Screening: Completed 09/22/14. Repeat every 3 years Lung Cancer Screening: You will receive a call from Burgess Estelle, RN to facilitate scheduling your CT Hepatitis C Screening: Completed 03/27/16  Vision and Dental Exams: Recommended annual ophthalmology exams for early detection of glaucoma and other disorders of the eye Recommended annual dental exams for proper oral hygiene  Vaccinations: Influenza vaccine: CONTRAINDICATED - Guillain Barre Syndrome Pneumococcal vaccine: CONTRAINDICATED - Guillain Barre Syndrome Tdap vaccine: Up to date Shingles vaccine: CONTRAINDICATED - Guillain Barre Syndrome  Advanced directives: Advance directive discussed with you today. I have provided a copy for you to complete at home and have notarized. Once this is complete please bring a copy in to our office so we can scan it into your chart.  Conditions/risks identified: Recommend to increase protein intake and to focus on eating 3 healthy meals and at least 2 healthy snacks per day.  Next appointment: Please schedule your Annual Wellness Visit with your Nurse Health Advisor in one year.  Preventive Care 53 Years and Older, Male Preventive care refers to lifestyle choices and visits with your health care provider that can promote health and wellness. What does preventive care include?  A yearly physical exam. This is also called an annual well check.  Dental exams once or twice a year.  Routine eye exams. Ask your health care provider how often you should have your eyes checked.  Personal lifestyle choices, including:  Daily care of your teeth and gums.  Regular physical activity.  Eating a healthy  diet.  Avoiding tobacco and drug use.  Limiting alcohol use.  Practicing safe sex.  Taking low doses of aspirin every day.  Taking vitamin and mineral supplements as recommended by your health care provider. What happens during an annual well check? The services and screenings done by your health care provider during your annual well check will depend on your age, overall health, lifestyle risk factors, and family history of disease. Counseling  Your health care provider may ask you questions about your:  Alcohol use.  Tobacco use.  Drug use.  Emotional well-being.  Home and relationship well-being.  Sexual activity.  Eating habits.  History of falls.  Memory and ability to understand (cognition).  Work and work Statistician. Screening  You may have the following tests or measurements:  Height, weight, and BMI.  Blood pressure.  Lipid and cholesterol levels. These may be checked every 5 years, or more frequently if you are over 79 years old.  Skin check.  Lung cancer screening. You may have this screening every year starting at age 62 if you have a 30-pack-year history of smoking and currently smoke or have quit within the past 15 years.  Fecal occult blood test (FOBT) of the stool. You may have this test every year starting at age 25.  Flexible sigmoidoscopy or colonoscopy. You may have a sigmoidoscopy every 5 years or a colonoscopy every 10 years starting at age 12.  Prostate cancer screening. Recommendations will vary depending on your family history and other risks.  Hepatitis C blood test.  Hepatitis B blood test.  Sexually transmitted disease (STD) testing.  Diabetes screening. This is done by checking your blood sugar (glucose) after you have not  eaten for a while (fasting). You may have this done every 1-3 years.  Abdominal aortic aneurysm (AAA) screening. You may need this if you are a current or former smoker.  Osteoporosis. You may be screened  starting at age 62 if you are at high risk. Talk with your health care provider about your test results, treatment options, and if necessary, the need for more tests. Vaccines  Your health care provider may recommend certain vaccines, such as:  Influenza vaccine. This is recommended every year.  Tetanus, diphtheria, and acellular pertussis (Tdap, Td) vaccine. You may need a Td booster every 10 years.  Zoster vaccine. You may need this after age 61.  Pneumococcal 13-valent conjugate (PCV13) vaccine. One dose is recommended after age 61.  Pneumococcal polysaccharide (PPSV23) vaccine. One dose is recommended after age 77. Talk to your health care provider about which screenings and vaccines you need and how often you need them. This information is not intended to replace advice given to you by your health care provider. Make sure you discuss any questions you have with your health care provider. Document Released: 01/22/2015 Document Revised: 09/15/2015 Document Reviewed: 10/27/2014 Elsevier Interactive Patient Education  2017 [place] Prevention in the Home Falls can cause injuries. They can happen to people of all ages. There are many things you can do to make your home safe and to help prevent falls. What can I do on the outside of my home?  Regularly fix the edges of walkways and driveways and fix any cracks.  Remove anything that might make you trip as you walk through a door, such as a raised step or threshold.  Trim any bushes or trees on the path to your home.  Use bright outdoor lighting.  Clear any walking paths of anything that might make someone trip, such as rocks or tools.  Regularly check to see if handrails are loose or broken. Make sure that both sides of any steps have handrails.  Any raised decks and porches should have guardrails on the edges.  Have any leaves, snow, or ice cleared regularly.  Use sand or salt on walking paths during  winter.  Clean up any spills in your garage right away. This includes oil or grease spills. What can I do in the bathroom?  Use night lights.  Install grab bars by the toilet and in the tub and shower. Do not use towel bars as grab bars.  Use non-skid mats or decals in the tub or shower.  If you need to sit down in the shower, use a plastic, non-slip stool.  Keep the floor dry. Clean up any water that spills on the floor as soon as it happens.  Remove soap buildup in the tub or shower regularly.  Attach bath mats securely with double-sided non-slip rug tape.  Do not have throw rugs and other things on the floor that can make you trip. What can I do in the bedroom?  Use night lights.  Make sure that you have a light by your bed that is easy to reach.  Do not use any sheets or blankets that are too big for your bed. They should not hang down onto the floor.  Have a firm chair that has side arms. You can use this for support while you get dressed.  Do not have throw rugs and other things on the floor that can make you trip. What can I do in the kitchen?  Clean up any spills  right away.  Avoid walking on wet floors.  Keep items that you use a lot in easy-to-reach places.  If you need to reach something above you, use a strong step stool that has a grab bar.  Keep electrical cords out of the way.  Do not use floor polish or wax that makes floors slippery. If you must use wax, use non-skid floor wax.  Do not have throw rugs and other things on the floor that can make you trip. What can I do with my stairs?  Do not leave any items on the stairs.  Make sure that there are handrails on both sides of the stairs and use them. Fix handrails that are broken or loose. Make sure that handrails are as long as the stairways.  Check any carpeting to make sure that it is firmly attached to the stairs. Fix any carpet that is loose or worn.  Avoid having throw rugs at the top or  bottom of the stairs. If you do have throw rugs, attach them to the floor with carpet tape.  Make sure that you have a light switch at the top of the stairs and the bottom of the stairs. If you do not have them, ask someone to add them for you. What else can I do to help prevent falls?  Wear shoes that:  Do not have high heels.  Have rubber bottoms.  Are comfortable and fit you well.  Are closed at the toe. Do not wear sandals.  If you use a stepladder:  Make sure that it is fully opened. Do not climb a closed stepladder.  Make sure that both sides of the stepladder are locked into place.  Ask someone to hold it for you, if possible.  Clearly mark and make sure that you can see:  Any grab bars or handrails.  First and last steps.  Where the edge of each step is.  Use tools that help you move around (mobility aids) if they are needed. These include:  Canes.  Walkers.  Scooters.  Crutches.  Turn on the lights when you go into a dark area. Replace any light bulbs as soon as they burn out.  Set up your furniture so you have a clear path. Avoid moving your furniture around.  If any of your floors are uneven, fix them.  If there are any pets around you, be aware of where they are.  Review your medicines with your doctor. Some medicines can make you feel dizzy. This can increase your chance of falling. Ask your doctor what other things that you can do to help prevent falls. This information is not intended to replace advice given to you by your health care provider. Make sure you discuss any questions you have with your health care provider. Document Released: 10/22/2008 Document Revised: 06/03/2015 Document Reviewed: 01/30/2014 Elsevier Interactive Patient Education  2017 [employer].

## 2017-05-14 ENCOUNTER — Ambulatory Visit: Payer: Medicare Other | Admitting: Family Medicine

## 2017-05-15 ENCOUNTER — Telehealth: Payer: Self-pay | Admitting: Family Medicine

## 2017-05-15 DIAGNOSIS — R918 Other nonspecific abnormal finding of lung field: Secondary | ICD-10-CM

## 2017-05-15 DIAGNOSIS — R634 Abnormal weight loss: Secondary | ICD-10-CM

## 2017-05-15 DIAGNOSIS — Z720 Tobacco use: Secondary | ICD-10-CM

## 2017-05-15 DIAGNOSIS — R911 Solitary pulmonary nodule: Secondary | ICD-10-CM

## 2017-05-15 NOTE — Assessment & Plan Note (Signed)
Order chest CT

## 2017-05-15 NOTE — Telephone Encounter (Signed)
Pt already scheduled for both.

## 2017-05-15 NOTE — Assessment & Plan Note (Signed)
Weight loss in a smoker with hx of lung nodule; order chest CT

## 2017-05-15 NOTE — Telephone Encounter (Signed)
Patient did not keep appt with me yesterday LPN reports weight loss Please let patient know that I want to get a chest CT; let him know how to schedule that please, and encourage him to schedule a f/u with me in the next few weeks Thank you

## 2017-05-15 NOTE — Assessment & Plan Note (Signed)
Will get chest CT

## 2017-05-22 ENCOUNTER — Ambulatory Visit
Admission: RE | Admit: 2017-05-22 | Discharge: 2017-05-22 | Disposition: A | Discharge status: Home / Self Care | Payer: Medicare Other | Source: Ambulatory Visit | Attending: Family Medicine | Admitting: Family Medicine

## 2017-05-22 DIAGNOSIS — R911 Solitary pulmonary nodule: Secondary | ICD-10-CM | POA: Insufficient documentation

## 2017-05-22 DIAGNOSIS — R634 Abnormal weight loss: Secondary | ICD-10-CM | POA: Diagnosis not present

## 2017-05-22 DIAGNOSIS — I2510 Atherosclerotic heart disease of native coronary artery without angina pectoris: Secondary | ICD-10-CM | POA: Diagnosis not present

## 2017-05-22 DIAGNOSIS — J439 Emphysema, unspecified: Secondary | ICD-10-CM | POA: Diagnosis not present

## 2017-05-22 DIAGNOSIS — Z720 Tobacco use: Secondary | ICD-10-CM

## 2017-05-22 DIAGNOSIS — Z681 Body mass index (BMI) 19 or less, adult: Secondary | ICD-10-CM | POA: Diagnosis not present

## 2017-05-22 DIAGNOSIS — I700 Atherosclerosis of aorta: Secondary | ICD-10-CM | POA: Diagnosis not present

## 2017-05-22 DIAGNOSIS — F17210 Nicotine dependence, cigarettes, uncomplicated: Secondary | ICD-10-CM | POA: Insufficient documentation

## 2017-05-22 DIAGNOSIS — R918 Other nonspecific abnormal finding of lung field: Secondary | ICD-10-CM

## 2017-05-22 DIAGNOSIS — J984 Other disorders of lung: Secondary | ICD-10-CM | POA: Diagnosis not present

## 2017-05-22 LAB — POCT I-STAT CREATININE: Creatinine, Ser: 0.7 mg/dL (ref 0.61–1.24)

## 2017-05-22 MED ORDER — IOPAMIDOL (ISOVUE-300) INJECTION 61%
75.0000 mL | Freq: Once | INTRAVENOUS | Status: AC | PRN
  Administered 2017-05-22: 75 mL via INTRAVENOUS

## 2017-05-23 ENCOUNTER — Encounter: Payer: Self-pay | Admitting: Family Medicine

## 2017-05-23 DIAGNOSIS — I700 Atherosclerosis of aorta: Secondary | ICD-10-CM | POA: Insufficient documentation

## 2017-05-24 ENCOUNTER — Encounter: Payer: Self-pay | Admitting: Family Medicine

## 2017-05-24 ENCOUNTER — Ambulatory Visit: Payer: Medicare Other | Admitting: Family Medicine

## 2017-05-24 VITALS — BP 122/72 | HR 93 | Temp 97.6°F | Resp 18 | Ht 69.0 in | Wt 120.5 lb

## 2017-05-24 DIAGNOSIS — Z720 Tobacco use: Secondary | ICD-10-CM | POA: Diagnosis not present

## 2017-05-24 DIAGNOSIS — J432 Centrilobular emphysema: Secondary | ICD-10-CM | POA: Diagnosis not present

## 2017-05-24 DIAGNOSIS — I6529 Occlusion and stenosis of unspecified carotid artery: Secondary | ICD-10-CM | POA: Diagnosis not present

## 2017-05-24 DIAGNOSIS — R636 Underweight: Secondary | ICD-10-CM | POA: Diagnosis not present

## 2017-05-24 DIAGNOSIS — Z599 Problem related to housing and economic circumstances, unspecified: Secondary | ICD-10-CM

## 2017-05-24 DIAGNOSIS — Z5181 Encounter for therapeutic drug level monitoring: Secondary | ICD-10-CM

## 2017-05-24 DIAGNOSIS — I700 Atherosclerosis of aorta: Secondary | ICD-10-CM

## 2017-05-24 DIAGNOSIS — Z598 Other problems related to housing and economic circumstances: Secondary | ICD-10-CM

## 2017-05-24 DIAGNOSIS — R911 Solitary pulmonary nodule: Secondary | ICD-10-CM

## 2017-05-24 MED ORDER — MIRTAZAPINE 15 MG PO TABS
7.5000 mg | ORAL_TABLET | Freq: Every day | ORAL | 0 refills | Status: DC
Start: 2017-05-24 — End: 2019-01-28

## 2017-05-24 MED ORDER — ATORVASTATIN CALCIUM 10 MG PO TABS: 10.0000 mg | ORAL_TABLET | Freq: Every day | ORAL | 1 refills | Status: DC

## 2017-05-24 NOTE — Assessment & Plan Note (Signed)
Encouraged him to quit completely; explained smoking is an appetite suppressant; contributes to plaque formation

## 2017-05-24 NOTE — Assessment & Plan Note (Signed)
Noted on chest CT; goal LDL less than 70

## 2017-05-24 NOTE — Assessment & Plan Note (Signed)
Goal LDL less than 70 

## 2017-05-24 NOTE — Assessment & Plan Note (Signed)
Noted on CT scan; goal LDL less than 70; discussed option for calcium scoring, evaluation with cardiologist

## 2017-05-24 NOTE — Assessment & Plan Note (Signed)
Stable, thought to be benign; chest CT done 05/23/17

## 2017-05-24 NOTE — Patient Instructions (Addendum)
Start the new medicine for appetite Start the new medicine for cholesterol Return on or just after June 28th for fasting labs Please do not skip meals Have ready to eat meal substitutes if you're in a hurry

## 2017-05-24 NOTE — Progress Notes (Signed)
Referral to [facility] placed this encounter. Brent [name] has been notified re: pt need for food resources.

## 2017-05-24 NOTE — Progress Notes (Signed)
BP 122/72 (BP Location: Right Arm, Patient Position: Sitting, Cuff Size: Small)   Pulse 93   Temp 97.6 F (36.4 C) (Oral)   Resp 18   Ht 5\' 9"  (1.753 m)   Wt 120 lb 8 oz (54.7 kg)   SpO2 99%   BMI 17.79 kg/m    Subjective:    Patient ID: Brent [name], male    DOB: 10-01-1945, 72 y.o.   MRN: [id number]  HPI: Brent [name] is a 72 y.o. male  Chief Complaint  Patient presents with  . Weight Loss    patient presents with some weight loss concerns. his wife stated that she notices he doesn't eat enough, stays busy, and by the end of the day his energy has dwindle. patient's wife stated that he eats a variety of foods when he does eat.    HPI Patient is here for f/u He has been losing weight, and has BMI now of 17.8; his weight is actually stable per our scales Has been stressed over family issues lately; not eating like he should; does enjoy eating; lives in a food dessert; would benefit he thinks from appetite stimulant  He had a CT scan of the chest May 22, 2017 No chest pain; had a stress test about two years ago; able to be as active as he wants to be; taking aspirin Slowed down his smoking considerably; down to maybe 5-6 cigs a week, one a day or so; not quite ready to take the plunge and quit   CLINICAL DATA:  Follow-up lung nodule.  EXAM: CT CHEST WITH CONTRAST  TECHNIQUE: Multidetector CT imaging of the chest was performed during intravenous contrast administration.  CONTRAST:  76mL ISOVUE-300 IOPAMIDOL (ISOVUE-300) INJECTION 61%  COMPARISON:  10/28/2015  FINDINGS: Cardiovascular: Heart is normal size. Aorta is normal caliber. Coronary artery and aortic calcifications.  Mediastinum/Nodes: No mediastinal, hilar, or axillary adenopathy.  Lungs/Pleura: Biapical scarring. Mild emphysema. 3 mm nodule in the left lung along the fissure on image 41 is stable dating back to July 2019 compatible with a benign nodule. No new pulmonary nodules or  effusions.  Upper Abdomen: Imaging into the upper abdomen shows no acute findings.  Musculoskeletal: Chest wall soft tissues are unremarkable. No acute bony abnormality.  IMPRESSION: Stable 3 mm nodule along the left fissure dating back to July 2017 compatible with a benign nodule.  Stable biapical scarring.  Coronary artery disease.  Aortic Atherosclerosis (ICD10-I70.0) and Emphysema (ICD10-J43.9).   Electronically Signed   By: Rolm Baptise M.D.   On: 05/23/2017 08:06   Depression screen [facility] 2/9 05/24/2017 05/10/2017 03/27/2016 07/19/2015 08/27/2014  Decreased Interest 0 1 0 0 0  Down, Depressed, Hopeless 1 1 0 2 0  PHQ - 2 Score 1 2 0 2 0  Altered sleeping 1 1 - 3 -  Tired, decreased energy 1 1 - 3 -  Change in appetite 1 3 - 3 -  Feeling bad or failure about yourself  0 0 - 0 -  Trouble concentrating 0 0 - 0 -  Moving slowly or fidgety/restless 0 3 - 1 -  Suicidal thoughts 0 0 - 0 -  PHQ-9 Score 4 10 - 12 -  Difficult doing work/chores Not difficult at all Somewhat difficult - Not difficult at all -    Relevant past medical, surgical, family and social history reviewed Past Medical History:  Diagnosis Date  . Aortic calcification ([place]) 07/2015   Noted on chest CT with contrast  .  Cataract   . Centrilobular emphysema ([place]) 08/03/2015   Noted on chest CT July 2017  . 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 Coronary artery calcification seen on CAT scan 07/29/2015   Noted on CT scan July 2017  . 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 Guillain Barr syndrome ([place])   . History of colonic polyps   . Hypochloremia   . Osteopenia 08/29/2016   August 2018; next scan August 2020  . Right Supraclavicular Lymph node 08/03/2015  . Solitary pulmonary nodule 08/03/2015   CT Chest 07/2015 - Subpleural 3 mm left upper lobe pulmonary nodule, probably benign.  . Tobacco abuse    Past Surgical History:  Procedure Laterality Date  . COLONOSCOPY  WITH PROPOFOL N/A 09/22/2014   Procedure: COLONOSCOPY WITH PROPOFOL;  Surgeon: Lucilla Lame, MD;  Location: [facility] ENDOSCOPY;  Service: Endoscopy;  Laterality: N/A;  . DEEP NECK LYMPH NODE BIOPSY / EXCISION Right   . HERNIA REPAIR  1988  . LYMPH NODE BIOPSY    . NM MYOVIEW ([place] HX)  10/09/2013   Dr. Lujean Amel: EF 58%. Normal wall motion. No ischemia or infarction.  . shoulder    . SHOULDER ARTHROSCOPY Right   . TRANSTHORACIC ECHOCARDIOGRAM  09/24/2013   EF 45-50% with mild global hypokinesis. GR 1 DD. No valvular lesions besides mild TR. Normal RV pressures.   Family History  Problem Relation Age of Onset  . Alcohol abuse Mother   . Healthy Daughter   . Parkinson's disease Other   . Stroke Other   . Diabetes Sister   . Heart disease Other   . Heart disease Other   . Alcohol abuse Brother   . Cancer Paternal Grandfather        pancreatic  . Heart disease Sister   . Heart disease Sister   . Healthy Son   . Healthy Daughter   . Bladder Cancer Neg Hx   . Kidney cancer Neg Hx    Social History   Tobacco Use  . Smoking status: Current Some Day Smoker    Packs/day: 0.25    Years: 50.00    Pack years: 12.50    Types: Cigarettes  . Smokeless tobacco: Never Used  . Tobacco comment: smokes 4 cig weekly.  Substance Use Topics  . Alcohol use: No  . Drug use: No    Interim medical history since last visit reviewed. Allergies and medications reviewed  Review of Systems Per HPI unless specifically indicated above     Objective:    BP 122/72 (BP Location: Right Arm, Patient Position: Sitting, Cuff Size: Small)   Pulse 93   Temp 97.6 F (36.4 C) (Oral)   Resp 18   Ht 5\' 9"  (1.753 m)   Wt 120 lb 8 oz (54.7 kg)   SpO2 99%   BMI 17.79 kg/m   Wt Readings from Last 3 Encounters:  05/24/17 120 lb 8 oz (54.7 kg)  05/10/17 120 lb 8 oz (54.7 kg)  03/02/17 120 lb (54.4 kg)    Physical Exam  Constitutional: He appears well-developed and well-nourished. No distress.  hin  male, no weight loss since last recorded  HENT:  Head: Normocephalic and atraumatic.  Eyes: EOM are normal. No scleral icterus.  Neck: No thyromegaly present.  Cardiovascular: Normal rate and regular rhythm.  Pulmonary/Chest: Effort normal and breath sounds normal.  Abdominal: Soft. Bowel sounds are normal. He exhibits no distension.  Musculoskeletal: He exhibits no edema.  Neurological:  Coordination normal.  Skin: Skin is warm and dry. No pallor.  Psychiatric: He has a normal mood and affect. His behavior is normal. Judgment and thought content normal.    Results for orders placed or performed during the hospital encounter of 05/22/17  I-STAT creatinine  Result Value Ref Range   Creatinine, Ser 0.70 0.61 - 1.24 mg/dL      Assessment & Plan:   Problem List Items Addressed This Visit      Cardiovascular and Mediastinum   Carotid artery calcification (Chronic)    Noted on CT scan; goal LDL less than 70; discussed option for calcium scoring, evaluation with cardiologist      Relevant Medications   atorvastatin (LIPITOR) 10 MG tablet   Other Relevant Orders   Lipid panel   Lipid panel   Atherosclerosis of aorta (HCC)    Goal LDL less than 70      Relevant Medications   atorvastatin (LIPITOR) 10 MG tablet   Other Relevant Orders   Lipid panel   Lipid panel   Aortic calcification (HCC) (Chronic)    Noted on chest CT; goal LDL less than 70      Relevant Medications   atorvastatin (LIPITOR) 10 MG tablet   Other Relevant Orders   Lipid panel   Lipid panel     Respiratory   Centrilobular emphysema (HCC) (Chronic)    F/u with pulmonologist; smoking cessation urged        Other   Underweight - Primary    Offered referral to nutritionist; he declined; start remeron; encouraged time management and not skipping meals      Relevant Orders   CBC with Differential/Platelet   COMPLETE METABOLIC PANEL WITH GFR   TSH   T4, free   Tobacco abuse (Chronic)    Encouraged  him to quit completely; explained smoking is an appetite suppressant; contributes to plaque formation      Incidental lung nodule, > 96mm and < 58mm    Stable, thought to be benign; chest CT done 05/23/17       Other Visit Diagnoses    Medication monitoring encounter       Relevant Orders   ALT       Follow up plan: No follow-ups on file.  An after-visit summary was printed and given to the patient at [place].  Please see the patient instructions which may contain other information and recommendations beyond what is mentioned above in the assessment and plan.  Meds ordered this encounter  Medications  . mirtazapine (REMERON) 15 MG tablet    Sig: Take 0.5 tablets (7.5 mg total) by mouth at bedtime.    Dispense:  15 tablet    Refill:  0  . atorvastatin (LIPITOR) 10 MG tablet    Sig: Take 1 tablet (10 mg total) by mouth at bedtime.    Dispense:  30 tablet    Refill:  1    Orders Placed This Encounter  Procedures  . ALT  . Lipid panel  . CBC with Differential/Platelet  . COMPLETE METABOLIC PANEL WITH GFR  . TSH  . T4, free  . Lipid panel

## 2017-05-24 NOTE — Addendum Note (Signed)
Addended by: Hardie Pulley, Yehonatan Grandison J on: 05/24/2017 10:33 AM   Modules accepted: Orders

## 2017-05-24 NOTE — Assessment & Plan Note (Signed)
Offered referral to nutritionist; he declined; start remeron; encouraged time management and not skipping meals

## 2017-05-24 NOTE — Assessment & Plan Note (Signed)
F/u with pulmonologist; smoking cessation urged

## 2017-05-31 LAB — COMPLETE METABOLIC PANEL WITH GFR
AG RATIO: 1.5 (calc) (ref 1.0–2.5)
ALT: 23 U/L (ref 9–46)
AST: 35 U/L (ref 10–35)
Albumin: 4.3 g/dL (ref 3.6–5.1)
Alkaline phosphatase (APISO): 71 U/L (ref 40–115)
BILIRUBIN TOTAL: 0.6 mg/dL (ref 0.2–1.2)
BUN: 11 mg/dL (ref 7–25)
CALCIUM: 9.4 mg/dL (ref 8.6–10.3)
CHLORIDE: 102 mmol/L (ref 98–110)
CO2: 30 mmol/L (ref 20–32)
Creat: 0.82 mg/dL (ref 0.70–1.18)
GFR, EST AFRICAN AMERICAN: 104 mL/min/{1.73_m2} (ref >=60)
GFR, Est Non African American: 90 mL/min/{1.73_m2} (ref >=60)
GLOBULIN: 2.8 g/dL (ref 1.9–3.7)
Glucose, Bld: 80 mg/dL (ref 65–99)
POTASSIUM: 4.3 mmol/L (ref 3.5–5.3)
SODIUM: 138 mmol/L (ref 135–146)
TOTAL PROTEIN: 7.1 g/dL (ref 6.1–8.1)

## 2017-05-31 LAB — CBC WITH DIFFERENTIAL/PLATELET
Basophils Absolute: 39 cells/uL (ref 0–200)
Basophils Relative: 0.9 %
EOS ABS: 151 {cells}/uL (ref 15–500)
Eosinophils Relative: 3.5 %
HEMATOCRIT: 47.4 % (ref 38.5–50.0)
Hemoglobin: 16 g/dL (ref 13.2–17.1)
LYMPHS ABS: 1415 {cells}/uL (ref 850–3900)
MCH: 30.7 pg (ref 27.0–33.0)
MCHC: 33.8 g/dL (ref 32.0–36.0)
MCV: 90.8 fL (ref 80.0–100.0)
MPV: 10.8 fL (ref 7.5–12.5)
Monocytes Relative: 10.9 %
Neutro Abs: 2227 cells/uL (ref 1500–7800)
Neutrophils Relative %: 51.8 %
PLATELETS: 238 10*3/uL (ref 140–400)
RBC: 5.22 10*6/uL (ref 4.20–5.80)
RDW: 11.7 % (ref 11.0–15.0)
TOTAL LYMPHOCYTE: 32.9 %
WBC: 4.3 10*3/uL (ref 3.8–10.8)
WBCMIX: 469 {cells}/uL (ref 200–950)

## 2017-05-31 LAB — LIPID PANEL
CHOL/HDL RATIO: 3.2 (calc) (ref <5.0)
Cholesterol: 164 mg/dL (ref <200)
HDL: 51 mg/dL (ref >40)
LDL CHOLESTEROL (CALC): 94 mg/dL
NON-HDL CHOLESTEROL (CALC): 113 mg/dL (ref <130)
Triglycerides: 95 mg/dL (ref <150)

## 2017-05-31 LAB — TSH: TSH: 1.59 mIU/L (ref 0.40–4.50)

## 2017-05-31 LAB — T4, FREE: Free T4: 1.4 ng/dL (ref 0.8–1.8)

## 2017-06-14 ENCOUNTER — Encounter: Payer: Self-pay | Admitting: Family Medicine

## 2018-04-09 IMAGING — CR DG CHEST 2V
2 series · 2 of 2 positions shown · non-contrast
Comparison: CT chest 10/28/2015

CLINICAL DATA: Pulmonary emphysema.  Follow-up pulmonary nodule.

EXAM:
CHEST  2 VIEW

[chest pa]
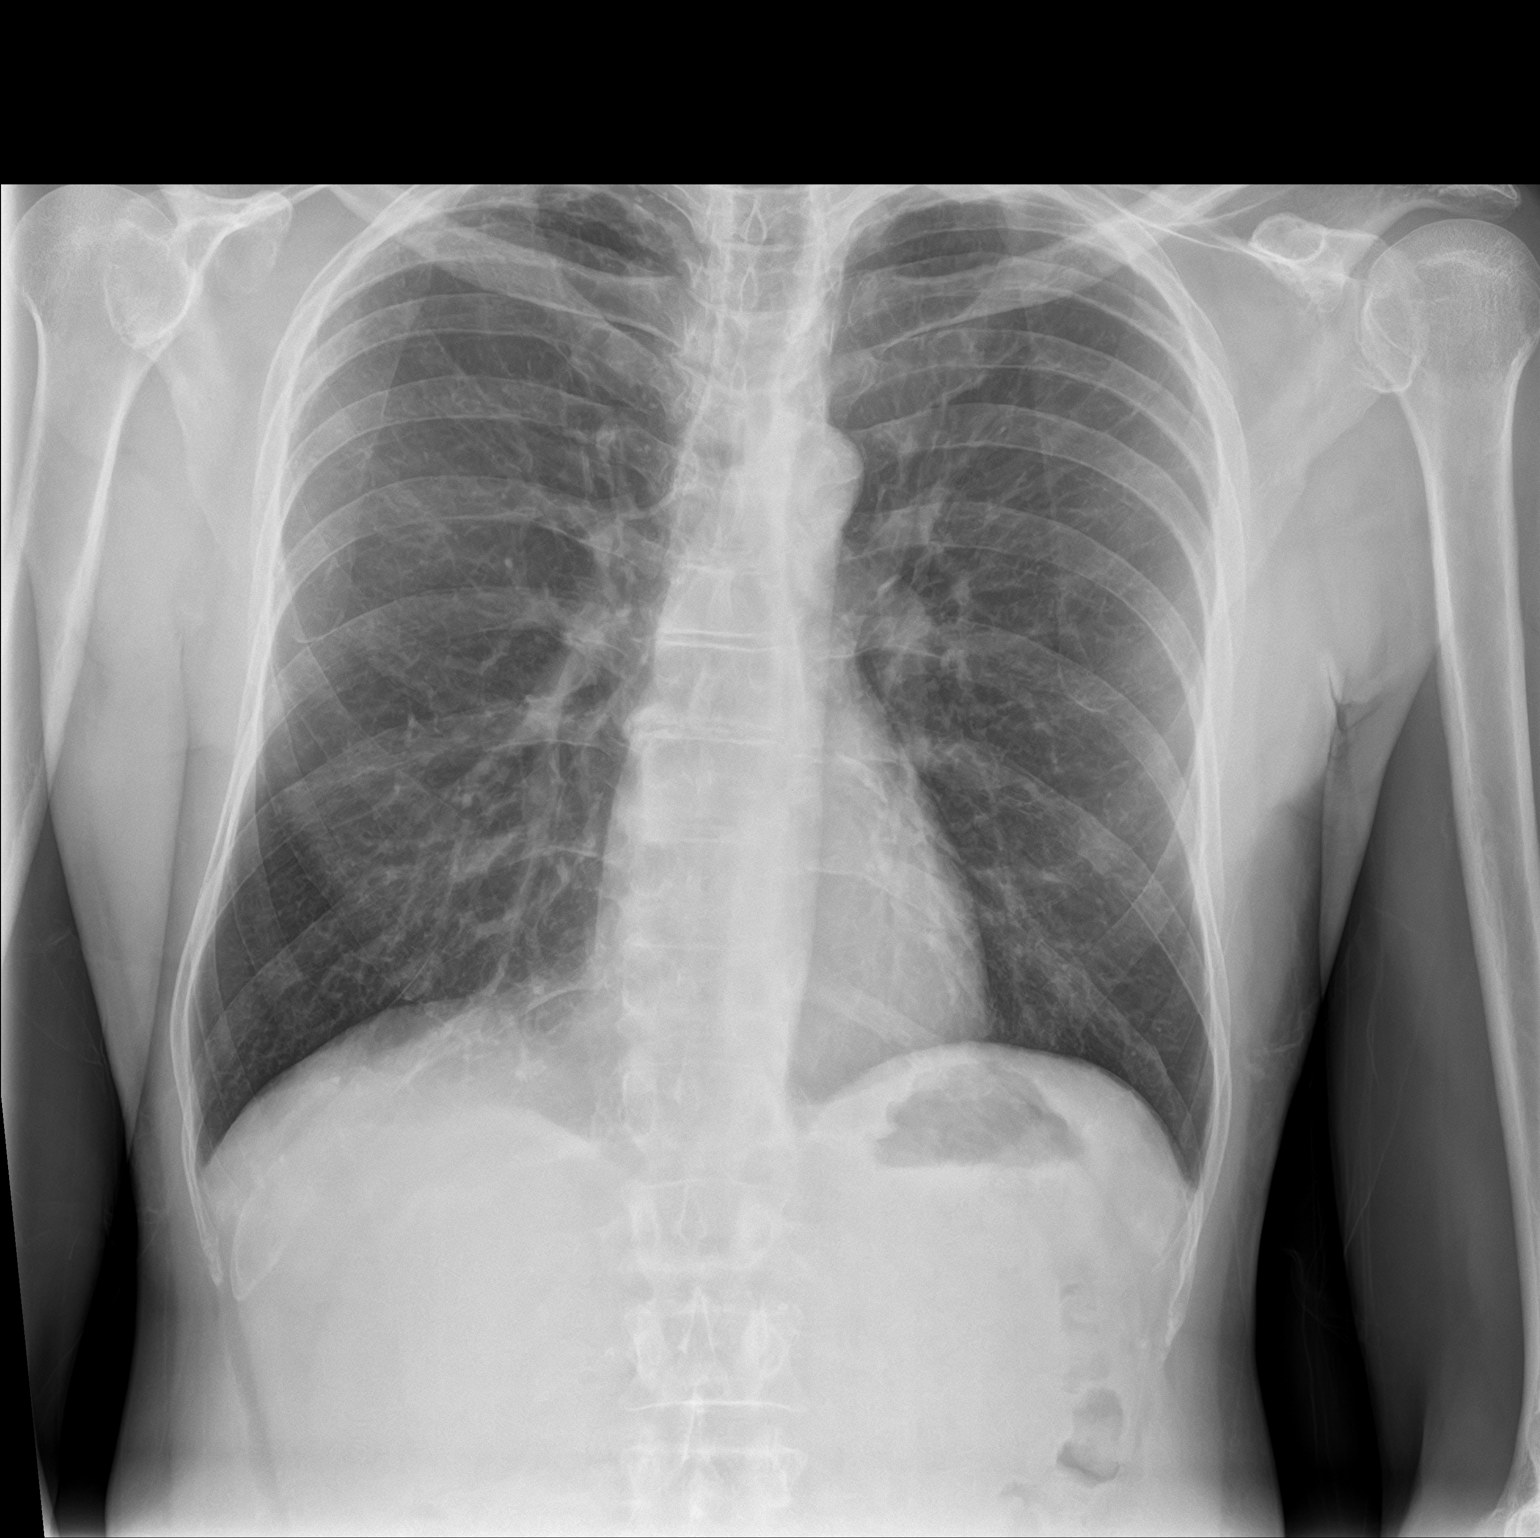

[chest lat]
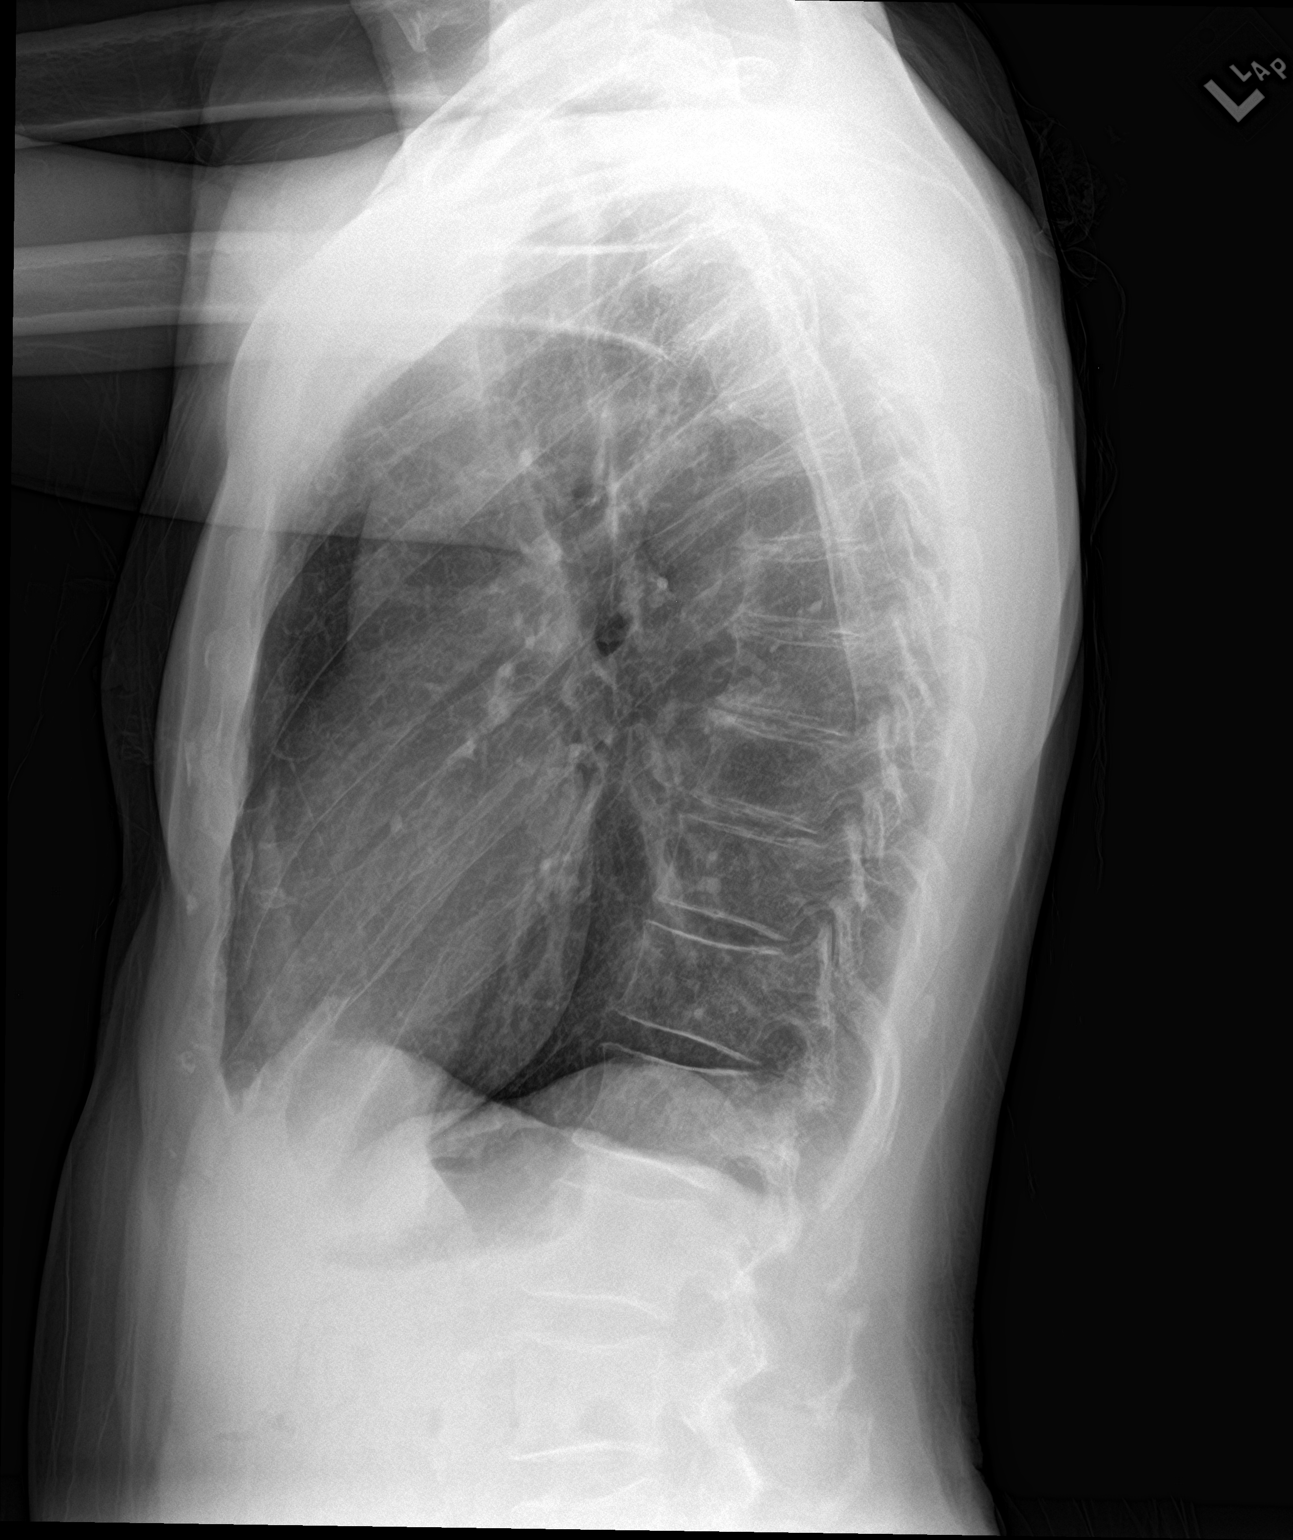

[2 of 2 positions shown; findings below may reference images not displayed]

FINDINGS: The heart size is normal. Lungs are clear. 3 mm pulmonary nodule is
below the resolution of this study. No focal nodule is present.
There is no airspace disease. Lungs are mildly hyperexpanded.
Emphysema was not evident on the prior CT. There is no edema or
effusion. The visualized soft tissues and bony thorax are
unremarkable.
IMPRESSION: 1. No acute cardiopulmonary disease.
2. 3 mm nodule seen on prior chest CT is below resolution of this
study.

## 2018-05-09 ENCOUNTER — Ambulatory Visit (INDEPENDENT_AMBULATORY_CARE_PROVIDER_SITE_OTHER): Payer: Medicare Other

## 2018-05-09 VITALS — BP 120/74 | HR 88 | Ht 69.5 in | Wt 120.0 lb

## 2018-05-09 DIAGNOSIS — Z0000 Encounter for general adult medical examination without abnormal findings: Secondary | ICD-10-CM | POA: Diagnosis not present

## 2018-05-09 DIAGNOSIS — Z1211 Encounter for screening for malignant neoplasm of colon: Secondary | ICD-10-CM | POA: Diagnosis not present

## 2018-05-09 NOTE — Progress Notes (Signed)
Subjective:   Brent [name] is a 73 y.o. male who presents for Medicare Annual/Subsequent preventive examination.  Virtual Visit via Telephone Note  I connected with Brent [name] on 05/09/18 at 11:20 AM EDT by telephone and verified that I am speaking with the correct person using two identifiers.  Location: Patient: home Provider: office   I discussed the limitations, risks, security and privacy concerns of performing an evaluation and management service by telephone and the availability of in person appointments.The patient expressed understanding and agreed to proceed. Some vital signs may be absent or patient reported.    History of Present Illness:     Brent Marker, LPN   Review of Systems:   Cardiac Risk Factors include: advanced age (>82men, >40 women);dyslipidemia;male gender     Objective:    Vitals: BP 120/74   Pulse 88   Ht 5' 9.5" (1.765 m)   Wt 120 lb (54.4 kg)   BMI 17.47 kg/m   Body mass index is 17.47 kg/m.  Advanced Directives 05/09/2018 05/10/2017 03/27/2016 07/19/2015 09/22/2014 10/01/2013 08/11/2013  Does Patient Have a Medical Advance Directive? No No No No No No Patient does not have advance directive;Patient would not like information  Would patient like information on creating a medical advance directive? Yes (MAU/Ambulatory/Procedural Areas - Information given) Yes (MAU/Ambulatory/Procedural Areas - Information given) - No - patient declined information - Yes - Educational materials given -  Pre-existing out of facility DNR order (yellow form or pink MOST form) - - - - - - No    Tobacco Social History   Tobacco Use  Smoking Status Current Some Day Smoker  . Packs/day: 0.25  . Years: 50.00  . Pack years: 12.50  . Types: Cigarettes  Smokeless Tobacco Never Used  Tobacco Comment   smokes 4 cig weekly.     Ready to quit: Yes Counseling given: No Comment: smokes 4 cig weekly.   Clinical Intake:  Pre-visit preparation completed:  Yes  Pain : No/denies pain     BMI - recorded: 17.47 Nutritional Status: BMI <19  Underweight Nutritional Risks: None Diabetes: No  How often do you need to have someone help you when you read instructions, pamphlets, or other written materials from your doctor or pharmacy?: 1 - Never  Interpreter Needed?: No  Information entered by :: Brent Marker LPN  Past Medical History:  Diagnosis Date  . Aortic calcification (HCC) 07/2015   Noted on chest CT with contrast  . Cataract   . Centrilobular emphysema ([place]) 08/03/2015   Noted on chest CT July 2017  . 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[place]) 07/29/2015  . Coronary artery calcification seen on CAT scan 07/29/2015   Noted on CT scan July 2017  . 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 Guillain Barr syndrome ([place])   . History of colonic polyps   . Hyperlipidemia   . Hypochloremia   . Osteopenia 08/29/2016   August 2018; next scan August 2020  . Right Supraclavicular Lymph node 08/03/2015  . Solitary pulmonary nodule 08/03/2015   CT Chest 07/2015 - Subpleural 3 mm left upper lobe pulmonary nodule, probably benign.  . Tobacco abuse    Past Surgical History:  Procedure Laterality Date  . COLONOSCOPY WITH PROPOFOL N/A 09/22/2014   Procedure: COLONOSCOPY WITH PROPOFOL;  Surgeon: Lucilla Lame, MD;  Location: [facility] ENDOSCOPY;  Service: Endoscopy;  Laterality: N/A;  . DEEP NECK LYMPH NODE BIOPSY / EXCISION Right   . HERNIA REPAIR  1988  . LYMPH NODE BIOPSY    . NM MYOVIEW ([place] HX)  10/09/2013   Dr. Lujean Amel: EF 58%. Normal wall motion. No ischemia or infarction.  . shoulder    . SHOULDER ARTHROSCOPY Right   . TRANSTHORACIC ECHOCARDIOGRAM  09/24/2013   EF 45-50% with mild global hypokinesis. GR 1 DD. No valvular lesions besides mild TR. Normal RV pressures.   Family History  Problem Relation Age of Onset  . Alcohol abuse Mother   . Healthy Daughter   . Parkinson's disease Other   . Stroke Other   . Diabetes  Sister   . Heart disease Other   . Heart disease Other   . Alcohol abuse Brother   . Cancer Paternal Grandfather        pancreatic  . Heart disease Sister   . Heart disease Sister   . Healthy Son   . Healthy Daughter   . Bladder Cancer Neg Hx   . Kidney cancer Neg Hx    Social History   Socioeconomic History  . Marital status: Married    Spouse name: Brent [name]  . Number of children: 3  . Years of education: some college  . Highest education level: 12th grade  Occupational History  . Occupation: Retired  Scientific laboratory technician  . Financial resource strain: Not hard at all  . Food insecurity:    Worry: Never true    Inability: Never true  . Transportation needs:    Medical: No    Non-medical: No  Tobacco Use  . Smoking status: Current Some Day Smoker    Packs/day: 0.25    Years: 50.00    Pack years: 12.50    Types: Cigarettes  . Smokeless tobacco: Never Used  . Tobacco comment: smokes 4 cig weekly.  Substance and Sexual Activity  . Alcohol use: No  . Drug use: No  . Sexual activity: Not Currently  Lifestyle  . Physical activity:    Days per week: 7 days    Minutes per session: 20 min  . Stress: Not at all  Relationships  . Social connections:    Talks on phone: More than three times a week    Gets together: Three times a week    Attends religious service: More than 4 times per year    Active member of club or organization: No    Attends meetings of clubs or organizations: Never    Relationship status: Married  Other Topics Concern  . Not on file  Social History Narrative   Current tobacco user, 2-3 cigarettes per day for the past 4-5 months, previously 2 packs per day for 52 years, formerly worked as a Administrator, has dogs at home.    Outpatient Encounter Medications as of 05/09/2018  Medication Sig  . acetaminophen (TYLENOL) 325 MG tablet Take 1 tablet by mouth every 6 (six) hours as needed.  Marland Kitchen aspirin EC 81 MG EC tablet Take 1 tablet (81 mg total) by mouth daily.   Marland Kitchen gabapentin (NEURONTIN) 100 MG capsule Take 1 capsule by mouth 3 (three) times daily. States he takes as needed  . mirtazapine (REMERON) 15 MG tablet Take 0.5 tablets (7.5 mg total) by mouth at bedtime.  Marland Kitchen albuterol (PROVENTIL HFA;VENTOLIN HFA) 108 (90 Base) MCG/ACT inhaler Inhale 2 puffs into the lungs every 4 (four) hours as needed for wheezing or shortness of breath. (Patient not taking: Reported on 05/24/2017)  . atorvastatin (LIPITOR) 10 MG tablet Take 1 tablet (10 mg total) by  mouth at bedtime. (Patient not taking: Reported on 05/09/2018)   No facility-administered encounter medications on file as of 05/09/2018.     Activities of Daily Living In your present state of health, do you have any difficulty performing the following activities: 05/09/2018 05/24/2017  Hearing? N N  Comment declines hearing aids denies hearing aids  Vision? N Y  Comment wears glasses wears eyeglasses, cataracts  Difficulty concentrating or making decisions? N Y  Comment - short term memory loss  Walking or climbing stairs? N Y  Comment - joint pain  Dressing or bathing? N N  Doing errands, shopping? N N  Preparing Food and eating ? N -  Comment - -  Using the Toilet? N -  In the past six months, have you accidently leaked urine? N -  Do you have problems with loss of bowel control? N -  Managing your Medications? N -  Managing your Finances? N -  Housekeeping or managing your Housekeeping? N -  Some recent data might be hidden    Patient Care Team: Lada, Satira Anis, MD as PCP - General (Family Medicine) [name], MD as Consulting Physician (Physical Medicine and Rehabilitation) Vladimir Crofts, MD (Neurology) Yolonda Kida, MD as Consulting Physician (Cardiology) Carloyn Manner, MD as Referring Physician (Otolaryngology) Wilhelmina Mcardle, MD as Consulting Physician (Pulmonary Disease)   Assessment:   This is a routine wellness examination for Brent [name].  Exercise Activities and Dietary  recommendations Current Exercise Habits: Home exercise routine, Type of exercise: walking, Time (Minutes): 20, Frequency (Times/Week): 7, Weekly Exercise (Minutes/Week): 140, Intensity: Mild, Exercise limited by: None identified  Goals    . DIET - INCREASE CALORIC INTAKE     Recommend to increase protein intake and to focus on eating 3 healthy meals and at least 2 healthy snacks per day.    Marland Kitchen DIET - INCREASE WATER INTAKE     Recommend increasing water intake to 6-8 glasses per day        Fall Risk Fall Risk  05/09/2018 05/24/2017 05/10/2017 03/27/2016 07/19/2015  Falls in the past year? 0 No No No No  Number falls in past yr: 0 - - - -  Injury with Fall? 0 - - - -  Risk for fall due to : - - Impaired vision - -  Risk for fall due to: Comment - - wears eyeglasses,cataracts - -  Follow up Falls prevention discussed - - - -   FALL RISK PREVENTION PERTAINING TO THE HOME:  Any stairs in or around the home? No  If so, do they handrails? No   Home free of loose throw rugs in walkways, pet beds, electrical cords, etc? Yes  Adequate lighting in your home to reduce risk of falls? Yes   ASSISTIVE DEVICES UTILIZED TO PREVENT FALLS:  Life alert? No  Use of a cane, walker or w/c? No  Grab bars in the bathroom? Yes  Shower chair or bench in shower? Yes  Elevated toilet seat or a handicapped toilet? Yes   DME ORDERS:  DME order needed?  No   TIMED UP AND GO:  Was the test performed? No . Telephonic visit.    Education: Fall risk prevention has been discussed.  Intervention(s) required? No    Depression Screen PHQ 2/9 Scores 05/09/2018 05/24/2017 05/10/2017 03/27/2016  PHQ - 2 Score 0 1 2 0  PHQ- 9 Score - 4 10 -    Cognitive Function     6CIT Screen 05/09/2018  05/10/2017  What Year? 0 points 0 points  What month? 0 points 0 points  What time? 0 points 3 points  Count back from 20 0 points 0 points  Months in reverse 0 points 0 points  Repeat phrase 0 points 0 points  Total Score  0 3    Immunization History  Administered Date(s) Administered  . Pneumococcal Conjugate-13 07/25/2013  . Td 02/18/2003  . Tdap 01/09/2009    Qualifies for Shingles Vaccine? Yes  Patient declines this vaccine.   Tdap: Up to date  Flu Vaccine: not eligible due to allergy  Pneumococcal Vaccine: not eligible due to allergy  Screening Tests Health Maintenance  Topic Date Due  . COLONOSCOPY  09/21/2017  . TETANUS/TDAP  01/10/2019  . Hepatitis C Screening  Completed  . INFLUENZA VACCINE  Discontinued  . PNA vac Low Risk Adult  Discontinued   Cancer Screenings:  Colorectal Screening: Completed 09/22/14. Repeat every 3 years. Referral to GI placed today. Pt aware the office will call re: appt.  Lung Cancer Screening: (Low Dose CT Chest recommended if Age 30-80 years, 30 pack-year currently smoking OR have quit w/in 15years.) DOES qualify.   Please advise patient regarding repeat screening of chest CT with contrast, last imaging done 05/22/17.   Additional Screening:  Hepatitis C Screening: does qualify; Completed 03/27/16  Vision Screening: Recommended annual ophthalmology exams for early detection of glaucoma and other disorders of the eye. Is the patient up to date with their annual eye exam?  Yes  Who is the provider or what is the name of the office in which the pt attends annual eye exams? [facility] [place]  Dental Screening: Recommended annual dental exams for proper oral hygiene  Community Resource Referral:  CRR required this visit?  No       Plan:   I have personally reviewed and addressed the Medicare Annual Wellness questionnaire and have noted the following in the patient's chart:  A. Medical and social history B. Use of alcohol, tobacco or illicit drugs  C. Current medications and supplements D. Functional ability and status E.  Nutritional status F.  Physical activity G. Advance directives H. List of other physicians I.  Hospitalizations,  surgeries, and ER visits in previous 12 months J.  [place] such as hearing and vision if needed, cognitive and depression L. Referrals and appointments   In addition, I have reviewed and discussed with patient certain preventive protocols, quality metrics, and best practice recommendations. A written personalized care plan for preventive services as well as general preventive health recommendations were provided to patient.   Signed,  Brent Marker, LPN Nurse Health Advisor   Nurse Notes: pt doing well and advised needs to schedule yearly exam with Dr. Sanda Klein to follow up regarding lipid panel and lung nodule.

## 2018-05-13 ENCOUNTER — Telehealth: Payer: Self-pay

## 2018-05-13 NOTE — Telephone Encounter (Signed)
LVM for pt to call office to schedule screening colonoscopy.  Thanks [employer]

## 2018-06-10 ENCOUNTER — Encounter: Payer: Self-pay | Admitting: *Deleted

## 2018-06-19 ENCOUNTER — Telehealth: Payer: Self-pay | Admitting: Gastroenterology

## 2018-06-19 NOTE — Telephone Encounter (Signed)
Pt left vm to schedule a colonoscopy cb [phone]

## 2018-06-20 ENCOUNTER — Other Ambulatory Visit: Payer: Self-pay

## 2018-06-20 DIAGNOSIS — Z1211 Encounter for screening for malignant neoplasm of colon: Secondary | ICD-10-CM

## 2018-06-20 DIAGNOSIS — Z86010 Personal history of colonic polyps: Secondary | ICD-10-CM

## 2018-06-20 NOTE — Telephone Encounter (Signed)
Pt left vm to schedule a colonoscopy   °

## 2018-06-20 NOTE — Telephone Encounter (Signed)
Gastroenterology Pre-Procedure Review  Request Date: 06/30 Requesting Physician: Dr. Allen Norris  PATIENT REVIEW QUESTIONS: The patient responded to the following health history questions as indicated:    1. Are you having any GI issues? no 2. Do you have a personal history of Polyps? yes (3 years ago) 3. Do you have a family history of Colon Cancer or Polyps? no 4. Diabetes Mellitus? no 5. Joint replacements in the past 12 months?no 6. Major health problems in the past 3 months?no 7. Any artificial heart valves, MVP, or defibrillator?no    MEDICATIONS & ALLERGIES:    Patient reports the following regarding taking any anticoagulation/antiplatelet therapy:   Plavix, Coumadin, Eliquis, Xarelto, Lovenox, Pradaxa, Brilinta, or Effient? no Aspirin? no  Patient confirms/reports the following medications:  Current Outpatient Medications  Medication Sig Dispense Refill  . acetaminophen (TYLENOL) 325 MG tablet Take 1 tablet by mouth every 6 (six) hours as needed.    Marland Kitchen albuterol (PROVENTIL HFA;VENTOLIN HFA) 108 (90 Base) MCG/ACT inhaler Inhale 2 puffs into the lungs every 4 (four) hours as needed for wheezing or shortness of breath. (Patient not taking: Reported on 05/24/2017) 1 Inhaler 6  . aspirin EC 81 MG EC tablet Take 1 tablet (81 mg total) by mouth daily.    Marland Kitchen atorvastatin (LIPITOR) 10 MG tablet Take 1 tablet (10 mg total) by mouth at bedtime. (Patient not taking: Reported on 05/09/2018) 30 tablet 1  . gabapentin (NEURONTIN) 100 MG capsule Take 1 capsule by mouth 3 (three) times daily. States he takes as needed    . mirtazapine (REMERON) 15 MG tablet Take 0.5 tablets (7.5 mg total) by mouth at bedtime. 15 tablet 0   No current facility-administered medications for this visit.     Patient confirms/reports the following allergies:  Allergies  Allergen Reactions  . Pneumococcal Vaccine Other (See Comments)    GUILLAIN BARRE SYNDROME NUMBNESS, SOB,CHEST PAIN   . Prevnar [Pneumococcal 13-Val  Conj Vacc] Other (See Comments)    GUILLAIN-BARRE SYNDROME  . Influenza Vaccines     GUILLAIN BARRE SYNDROME     No orders of the defined types were placed in this encounter.   AUTHORIZATION INFORMATION Primary Insurance: 1D#: Group #:  Secondary Insurance: 1D#: Group #:  SCHEDULE INFORMATION: Date: 07/09/18 Time: Location:[facility]

## 2018-07-05 ENCOUNTER — Other Ambulatory Visit
Admission: RE | Admit: 2018-07-05 | Discharge: 2018-07-05 | Disposition: A | Discharge status: Home / Self Care | Payer: Medicare Other | Source: Ambulatory Visit | Attending: Gastroenterology | Admitting: Gastroenterology

## 2018-07-05 ENCOUNTER — Other Ambulatory Visit: Payer: Self-pay

## 2018-07-05 DIAGNOSIS — Z1159 Encounter for screening for other viral diseases: Secondary | ICD-10-CM | POA: Diagnosis present

## 2018-07-06 LAB — NOVEL CORONAVIRUS, NAA (HOSP ORDER, SEND-OUT TO REF LAB; TAT 18-24 HRS): SARS-CoV-2, NAA: NOT DETECTED

## 2018-07-09 ENCOUNTER — Ambulatory Visit
Admission: RE | Admit: 2018-07-09 | Discharge: 2018-07-09 | Disposition: A | Discharge status: Home / Self Care | Payer: Medicare Other | Attending: Gastroenterology | Admitting: Gastroenterology

## 2018-07-09 ENCOUNTER — Other Ambulatory Visit: Payer: Self-pay

## 2018-07-09 ENCOUNTER — Ambulatory Visit: Payer: Medicare Other | Admitting: Anesthesiology

## 2018-07-09 ENCOUNTER — Encounter: Payer: Self-pay | Admitting: Anesthesiology

## 2018-07-09 ENCOUNTER — Encounter
Admission: RE | Disposition: A | Discharge status: Home / Self Care | Payer: Self-pay | Source: Home / Self Care | Attending: Gastroenterology

## 2018-07-09 DIAGNOSIS — Z820 Family history of epilepsy and other diseases of the nervous system: Secondary | ICD-10-CM | POA: Insufficient documentation

## 2018-07-09 DIAGNOSIS — E878 Other disorders of electrolyte and fluid balance, not elsewhere classified: Secondary | ICD-10-CM | POA: Insufficient documentation

## 2018-07-09 DIAGNOSIS — J449 Chronic obstructive pulmonary disease, unspecified: Secondary | ICD-10-CM | POA: Insufficient documentation

## 2018-07-09 DIAGNOSIS — G709 Myoneural disorder, unspecified: Secondary | ICD-10-CM | POA: Insufficient documentation

## 2018-07-09 DIAGNOSIS — R911 Solitary pulmonary nodule: Secondary | ICD-10-CM | POA: Diagnosis not present

## 2018-07-09 DIAGNOSIS — Z811 Family history of alcohol abuse and dependence: Secondary | ICD-10-CM | POA: Diagnosis not present

## 2018-07-09 DIAGNOSIS — Z8669 Personal history of other diseases of the nervous system and sense organs: Secondary | ICD-10-CM | POA: Diagnosis not present

## 2018-07-09 DIAGNOSIS — K641 Second degree hemorrhoids: Secondary | ICD-10-CM | POA: Diagnosis not present

## 2018-07-09 DIAGNOSIS — Z1211 Encounter for screening for malignant neoplasm of colon: Secondary | ICD-10-CM | POA: Diagnosis present

## 2018-07-09 DIAGNOSIS — M8580 Other specified disorders of bone density and structure, unspecified site: Secondary | ICD-10-CM | POA: Insufficient documentation

## 2018-07-09 DIAGNOSIS — Z79899 Other long term (current) drug therapy: Secondary | ICD-10-CM | POA: Diagnosis not present

## 2018-07-09 DIAGNOSIS — Z86010 Personal history of colonic polyps: Secondary | ICD-10-CM | POA: Insufficient documentation

## 2018-07-09 DIAGNOSIS — Z8249 Family history of ischemic heart disease and other diseases of the circulatory system: Secondary | ICD-10-CM | POA: Insufficient documentation

## 2018-07-09 DIAGNOSIS — K635 Polyp of colon: Secondary | ICD-10-CM

## 2018-07-09 DIAGNOSIS — D120 Benign neoplasm of cecum: Secondary | ICD-10-CM | POA: Diagnosis not present

## 2018-07-09 DIAGNOSIS — Z800 Family history of malignant neoplasm of digestive organs: Secondary | ICD-10-CM | POA: Insufficient documentation

## 2018-07-09 DIAGNOSIS — Z7951 Long term (current) use of inhaled steroids: Secondary | ICD-10-CM | POA: Diagnosis not present

## 2018-07-09 DIAGNOSIS — Z823 Family history of stroke: Secondary | ICD-10-CM | POA: Diagnosis not present

## 2018-07-09 DIAGNOSIS — I2510 Atherosclerotic heart disease of native coronary artery without angina pectoris: Secondary | ICD-10-CM | POA: Diagnosis not present

## 2018-07-09 DIAGNOSIS — Z8673 Personal history of transient ischemic attack (TIA), and cerebral infarction without residual deficits: Secondary | ICD-10-CM | POA: Diagnosis not present

## 2018-07-09 DIAGNOSIS — D123 Benign neoplasm of transverse colon: Secondary | ICD-10-CM | POA: Diagnosis not present

## 2018-07-09 DIAGNOSIS — D122 Benign neoplasm of ascending colon: Secondary | ICD-10-CM

## 2018-07-09 DIAGNOSIS — E785 Hyperlipidemia, unspecified: Secondary | ICD-10-CM | POA: Insufficient documentation

## 2018-07-09 DIAGNOSIS — Z887 Allergy status to serum and vaccine status: Secondary | ICD-10-CM | POA: Insufficient documentation

## 2018-07-09 DIAGNOSIS — Z833 Family history of diabetes mellitus: Secondary | ICD-10-CM | POA: Diagnosis not present

## 2018-07-09 DIAGNOSIS — D124 Benign neoplasm of descending colon: Secondary | ICD-10-CM | POA: Insufficient documentation

## 2018-07-09 HISTORY — PX: COLONOSCOPY WITH PROPOFOL: SHX5780

## 2018-07-09 SURGERY — COLONOSCOPY WITH PROPOFOL
Anesthesia: General

## 2018-07-09 MED ORDER — PROPOFOL 10 MG/ML IV BOLUS
INTRAVENOUS | Status: DC | PRN
  Administered 2018-07-09: 70 mg via INTRAVENOUS

## 2018-07-09 MED ORDER — LIDOCAINE HCL (PF) 2 % IJ SOLN
INTRAMUSCULAR | Status: AC
  Filled 2018-07-09: qty 10

## 2018-07-09 MED ORDER — LIDOCAINE HCL (CARDIAC) PF 100 MG/5ML IV SOSY
PREFILLED_SYRINGE | INTRAVENOUS | Status: DC | PRN
  Administered 2018-07-09: 50 mg via INTRAVENOUS

## 2018-07-09 MED ORDER — SODIUM CHLORIDE 0.9 % IV SOLN
INTRAVENOUS | Status: DC
  Administered 2018-07-09: 1000 mL via INTRAVENOUS

## 2018-07-09 MED ORDER — PROPOFOL 500 MG/50ML IV EMUL
INTRAVENOUS | Status: AC
  Filled 2018-07-09: qty 50

## 2018-07-09 MED ORDER — PROPOFOL 500 MG/50ML IV EMUL
INTRAVENOUS | Status: DC | PRN
  Administered 2018-07-09: 130 ug/kg/min via INTRAVENOUS

## 2018-07-09 NOTE — H&P (Signed)
Lucilla Lame, MD [address], [place] [address] Phone:[phone] Fax : [phone]  Primary Care Physician:  Arnetha Courser, MD Primary Gastroenterologist:  Dr. Allen Norris  Pre-Procedure History & Physical: HPI:  Brent [name] is a 73 y.o. male is here for an colonoscopy.   Past Medical History:  Diagnosis Date  . Aortic calcification (HCC) 07/2015   Noted on chest CT with contrast  . Cataract   . Centrilobular emphysema ([place]) 08/03/2015   Noted on chest CT July 2017  . 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[place]) 07/29/2015  . Coronary artery calcification seen on CAT scan 07/29/2015   Noted on CT scan July 2017  . 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 Guillain Barr syndrome ([place])   . History of colonic polyps   . Hyperlipidemia   . Hypochloremia   . Osteopenia 08/29/2016   August 2018; next scan August 2020  . Right Supraclavicular Lymph node 08/03/2015  . Solitary pulmonary nodule 08/03/2015   CT Chest 07/2015 - Subpleural 3 mm left upper lobe pulmonary nodule, probably benign.  . Tobacco abuse     Past Surgical History:  Procedure Laterality Date  . COLONOSCOPY WITH PROPOFOL N/A 09/22/2014   Procedure: COLONOSCOPY WITH PROPOFOL;  Surgeon: Lucilla Lame, MD;  Location: [facility] ENDOSCOPY;  Service: Endoscopy;  Laterality: N/A;  . DEEP NECK LYMPH NODE BIOPSY / EXCISION Right   . HERNIA REPAIR  1988  . LYMPH NODE BIOPSY    . NM MYOVIEW ([place] [facility])  10/09/2013   Dr. Lujean Amel: EF 58%. Normal wall motion. No ischemia or infarction.  . shoulder    . SHOULDER ARTHROSCOPY Right   . TRANSTHORACIC ECHOCARDIOGRAM  09/24/2013   EF 45-50% with mild global hypokinesis. GR 1 DD. No valvular lesions besides mild TR. Normal RV pressures.    Prior to Admission medications   Medication Sig Start Date End Date Taking? Authorizing Provider  acetaminophen (TYLENOL) 325 MG tablet Take 1 tablet by mouth every 6 (six) hours as needed.   Yes [provider]  albuterol (PROVENTIL HFA;VENTOLIN HFA) 108 (90 Base) MCG/ACT inhaler Inhale 2 puffs into the lungs every 4 (four) hours as needed for wheezing or shortness of breath. 03/02/17  Yes Wilhelmina Mcardle, MD  aspirin EC 81 MG EC tablet Take 1 tablet (81 mg total) by mouth daily. 08/18/13  Yes Love, Ivan Anchors, PA-C  atorvastatin (LIPITOR) 10 MG tablet Take 1 tablet (10 mg total) by mouth at bedtime. 05/24/17  Yes Lada, Satira Anis, MD  gabapentin (NEURONTIN) 100 MG capsule Take 1 capsule by mouth 3 (three) times daily. States he takes as needed 09/29/13  Yes [provider]  mirtazapine (REMERON) 15 MG tablet Take 0.5 tablets (7.5 mg total) by mouth at bedtime. 05/24/17  Yes Arnetha Courser, MD    Allergies as of 06/20/2018 - Review Complete 05/09/2018  Allergen Reaction Noted  . Pneumococcal vaccine Other (See Comments) 10/01/2013  . Prevnar [pneumococcal 13-val conj vacc] Other (See Comments) 07/24/2014  . Influenza vaccines  05/10/2017    Family History  Problem Relation Age of Onset  . Alcohol abuse Mother   . Healthy Daughter   . Parkinson's disease Other   . Stroke Other   . Diabetes Sister   . Heart disease Other   . Heart disease Other   . Alcohol abuse Brother   . Cancer Paternal Grandfather        pancreatic  .  Heart disease Sister   . Heart disease Sister   . Healthy Son   . Healthy Daughter   . Bladder Cancer Neg Hx   . Kidney cancer Neg Hx     Social History   Socioeconomic History  . Marital status: Married    Spouse name: [name]  . Number of children: 3  . Years of education: some college  . Highest education level: 12th grade  Occupational History  . Occupation: Retired  Scientific laboratory technician  . Financial resource strain: Not hard at all  . Food insecurity    Worry: Never true    Inability: Never true  . Transportation needs    Medical: No    Non-medical: No  Tobacco Use  . Smoking status: Current Some Day Smoker    Packs/day: 0.25    Years:  50.00    Pack years: 12.50    Types: Cigarettes  . Smokeless tobacco: Never Used  . Tobacco comment: smokes 4 cig weekly.  Substance and Sexual Activity  . Alcohol use: No  . Drug use: No  . Sexual activity: Not Currently  Lifestyle  . Physical activity    Days per week: 7 days    Minutes per session: 20 min  . Stress: Not at all  Relationships  . Social connections    Talks on phone: More than three times a week    Gets together: Three times a week    Attends religious service: More than 4 times per year    Active member of club or organization: No    Attends meetings of clubs or organizations: Never    Relationship status: Married  . Intimate partner violence    Fear of current or ex partner: No    Emotionally abused: No    Physically abused: No    Forced sexual activity: No  Other Topics Concern  . Not on file  Social History Narrative   Current tobacco user, 2-3 cigarettes per day for the past 4-5 months, previously 2 packs per day for 52 years, formerly worked as a Administrator, has dogs at home.    Review of Systems: See HPI, otherwise negative ROS  Physical Exam: BP 123/87   Pulse 93   Temp 99.6 F (37.6 C) (Tympanic)   Resp 20   Ht 5' 9.5" (1.765 m)   Wt 59 kg   SpO2 99%   BMI 18.92 kg/m  General:   Alert,  pleasant and cooperative in NAD Head:  Normocephalic and atraumatic. Neck:  Supple; no masses or thyromegaly. Lungs:  Clear throughout to auscultation.    Heart:  Regular rate and rhythm. Abdomen:  Soft, nontender and nondistended. Normal bowel sounds, without guarding, and without rebound.   Neurologic:  Alert and  oriented x4;  grossly normal neurologically.  Impression/Plan: [name] is here for an colonoscopy to be performed for history of colon polyps 09/22/14  Risks, benefits, limitations, and alternatives regarding  colonoscopy have been reviewed with the patient.  Questions have been answered.  All parties agreeable.   Lucilla Lame, MD  07/09/2018, 9:12 AM

## 2018-07-09 NOTE — Op Note (Signed)
[facility] Patient Name: Brent [name] Procedure Date: 07/09/2018 9:53 AM MRN: [id number] Account #: [id number] Date of Birth: 02-28-1945 Admit Type: Outpatient Age: 73 Room: [facility] ENDO ROOM 4 Gender: Male Note Status: Finalized Procedure:            Colonoscopy Indications:          High risk colon cancer surveillance: Personal history                        of colonic polyps Providers:            Lucilla Lame MD, MD Medicines:            Propofol per Anesthesia Complications:        No immediate complications. Procedure:            Pre-Anesthesia Assessment:                       - Prior to the procedure, a History and Physical was                        performed, and patient medications and allergies were                        reviewed. The patient's tolerance of previous                        anesthesia was also reviewed. The risks and benefits of                        the procedure and the sedation options and risks were                        discussed with the patient. All questions were                        answered, and informed consent was obtained. Prior                        Anticoagulants: The patient has taken no previous                        anticoagulant or antiplatelet agents. ASA Grade                        Assessment: II - A patient with mild systemic disease.                        After reviewing the risks and benefits, the patient was                        deemed in satisfactory condition to undergo the                        procedure.                       After obtaining informed consent, the colonoscope was                        passed under direct vision. Throughout the  procedure,                        the patient's blood pressure, pulse, and oxygen                        saturations were monitored continuously. The                        Colonoscope was introduced through the anus and      advanced to the the cecum, identified by appendiceal                        orifice and ileocecal valve. The colonoscopy was                        performed without difficulty. The patient tolerated the                        procedure well. The quality of the bowel preparation                        was excellent. Findings:      The perianal and digital rectal examinations were normal.      A 2 mm polyp was found in the cecum. The polyp was sessile. The polyp       was removed with a cold biopsy forceps. Resection and retrieval were       complete.      Two sessile polyps were found in the transverse colon. The polyps were 3       to 5 mm in size. These polyps were removed with a cold biopsy forceps.       Resection and retrieval were complete.      A 4 mm polyp was found in the descending colon. The polyp was sessile.       The polyp was removed with a cold biopsy forceps. Resection and       retrieval were complete.      Non-bleeding internal hemorrhoids were found during retroflexion. The       hemorrhoids were Grade II (internal hemorrhoids that prolapse but reduce       spontaneously). Impression:           - One 2 mm polyp in the cecum, removed with a cold                        biopsy forceps. Resected and retrieved.                       - Two 3 to 5 mm polyps in the transverse colon, removed                        with a cold biopsy forceps. Resected and retrieved.                       - One 4 mm polyp in the descending colon, removed with                        a cold biopsy forceps. Resected and retrieved.                       -  Non-bleeding internal hemorrhoids. Recommendation:       - Discharge patient to home.                       - Resume previous diet.                       - Continue present medications.                       - Await pathology results.                       - Repeat colonoscopy in 5 years for surveillance. Procedure Code(s):    --- Professional  ---                       [phone], Colonoscopy, flexible; with biopsy, single or                        multiple Diagnosis Code(s):    --- Professional ---                       Z86.010, Personal history of colonic polyps                       K63.5, Polyp of colon CPT copyright 2019 American Medical Association. All rights reserved. The codes documented in this report are preliminary and upon coder review may  be revised to meet current compliance requirements. Lucilla Lame MD, MD 07/09/2018 10:23:12 AM This report has been signed electronically. Number of Addenda: 0 Note Initiated On: 07/09/2018 9:53 AM Scope Withdrawal Time: 0 hours 7 minutes 7 seconds  Total Procedure Duration: 0 hours 10 minutes 15 seconds  Estimated Blood Loss: Estimated blood loss: none.      [facility]

## 2018-07-09 NOTE — Anesthesia Postprocedure Evaluation (Signed)
Anesthesia Post Note  Patient: Brent [name]  Procedure(s) Performed: COLONOSCOPY WITH PROPOFOL (N/A )  Patient location during evaluation: Endoscopy Anesthesia Type: General Level of consciousness: awake and alert Pain management: pain level controlled Vital Signs Assessment: post-procedure vital signs reviewed and stable Respiratory status: spontaneous breathing, nonlabored ventilation, respiratory function stable and patient connected to nasal cannula oxygen Cardiovascular status: blood pressure returned to baseline and stable Postop Assessment: no apparent nausea or vomiting Anesthetic complications: no     Last Vitals:  Vitals:   07/09/18 1045 07/09/18 1055  BP: 100/62 127/88  Pulse: 71 66  Resp: 16 14  Temp:    SpO2: 100% 100%    Last Pain:  Vitals:   07/09/18 1055  TempSrc:   PainSc: 0-No pain                 Darrol Brandenburg S

## 2018-07-09 NOTE — Anesthesia Post-op Follow-up Note (Signed)
Anesthesia QCDR form completed.        

## 2018-07-09 NOTE — Anesthesia Preprocedure Evaluation (Addendum)
Anesthesia Evaluation  Patient identified by MRN, date of birth, ID band Patient awake    Reviewed: Allergy & Precautions, NPO status , Patient's Chart, lab work & pertinent test results, reviewed documented beta blocker date and time   Airway Mallampati: II  TM Distance: >3 FB     Dental  (+) Upper Dentures, Lower Dentures   Pulmonary COPD, Current Smoker,           Cardiovascular + CAD       Neuro/Psych  Neuromuscular disease    GI/Hepatic   Endo/Other    Renal/GU      Musculoskeletal   Abdominal   Peds  Hematology   Anesthesia Other Findings Guillain barre with some tingling. Can walk ok. EKG from 5 yr ok. Smokes.   Reproductive/Obstetrics                            Anesthesia Physical Anesthesia Plan  ASA: III  Anesthesia Plan: General   Post-op Pain Management:    Induction: Intravenous  PONV Risk Score and Plan:   Airway Management Planned:   Additional Equipment:   Intra-op Plan:   Post-operative Plan:   Informed Consent: I have reviewed the patients History and Physical, chart, labs and discussed the procedure including the risks, benefits and alternatives for the proposed anesthesia with the patient or authorized representative who has indicated his/her understanding and acceptance.       Plan Discussed with: CRNA  Anesthesia Plan Comments:         Anesthesia Quick Evaluation

## 2018-07-09 NOTE — Transfer of Care (Signed)
Immediate Anesthesia Transfer of Care Note  Patient: Brent [name]  Procedure(s) Performed: COLONOSCOPY WITH PROPOFOL (N/A )  Patient Location: PACU and Endoscopy Unit  Anesthesia Type:General  Level of Consciousness: drowsy  Airway & Oxygen Therapy: Patient Spontanous Breathing  Post-op Assessment: Report given to RN and Post -op Vital signs reviewed and stable  Post vital signs: Reviewed and stable  Last Vitals: See nursing vital sign flow sheet Vitals Value Taken Time  BP    Temp    Pulse    Resp    SpO2      Last Pain:  Vitals:   07/09/18 0856  TempSrc: Tympanic  PainSc: 0-No pain         Complications: No apparent anesthesia complications

## 2018-07-10 ENCOUNTER — Encounter: Payer: Self-pay | Admitting: Gastroenterology

## 2018-07-10 LAB — SURGICAL PATHOLOGY

## 2018-07-29 ENCOUNTER — Telehealth: Payer: Self-pay | Admitting: Pulmonary Disease

## 2018-07-29 NOTE — Telephone Encounter (Signed)
Received medical record request.  This request has been faxed to [employer].  Nothing further is needed.

## 2018-10-23 ENCOUNTER — Encounter: Payer: Medicare Other | Admitting: Family Medicine

## 2018-10-28 ENCOUNTER — Encounter: Payer: Self-pay | Admitting: Family Medicine

## 2018-10-28 ENCOUNTER — Other Ambulatory Visit: Payer: Self-pay

## 2018-10-28 ENCOUNTER — Ambulatory Visit (INDEPENDENT_AMBULATORY_CARE_PROVIDER_SITE_OTHER): Payer: Medicare Other | Admitting: Family Medicine

## 2018-10-28 ENCOUNTER — Other Ambulatory Visit (HOSPITAL_COMMUNITY)
Admission: RE | Admit: 2018-10-28 | Discharge: 2018-10-28 | Disposition: A | Discharge status: Home / Self Care | Payer: Medicare Other | Source: Ambulatory Visit | Attending: Family Medicine | Admitting: Family Medicine

## 2018-10-28 ENCOUNTER — Telehealth: Payer: Self-pay | Admitting: *Deleted

## 2018-10-28 VITALS — BP 130/70 | HR 89 | Temp 97.8°F | Resp 16 | Ht 70.0 in | Wt 116.7 lb

## 2018-10-28 DIAGNOSIS — Z113 Encounter for screening for infections with a predominantly sexual mode of transmission: Secondary | ICD-10-CM

## 2018-10-28 DIAGNOSIS — Z122 Encounter for screening for malignant neoplasm of respiratory organs: Secondary | ICD-10-CM

## 2018-10-28 DIAGNOSIS — Z131 Encounter for screening for diabetes mellitus: Secondary | ICD-10-CM

## 2018-10-28 DIAGNOSIS — Z136 Encounter for screening for cardiovascular disorders: Secondary | ICD-10-CM | POA: Diagnosis not present

## 2018-10-28 DIAGNOSIS — Z87891 Personal history of nicotine dependence: Secondary | ICD-10-CM

## 2018-10-28 DIAGNOSIS — I700 Atherosclerosis of aorta: Secondary | ICD-10-CM

## 2018-10-28 DIAGNOSIS — L0291 Cutaneous abscess, unspecified: Secondary | ICD-10-CM

## 2018-10-28 DIAGNOSIS — Z0000 Encounter for general adult medical examination without abnormal findings: Secondary | ICD-10-CM | POA: Diagnosis present

## 2018-10-28 DIAGNOSIS — Z125 Encounter for screening for malignant neoplasm of prostate: Secondary | ICD-10-CM

## 2018-10-28 DIAGNOSIS — G610 Guillain-Barre syndrome: Secondary | ICD-10-CM

## 2018-10-28 MED ORDER — GABAPENTIN 100 MG PO CAPS: 100.0000 mg | ORAL_CAPSULE | Freq: Every day | ORAL | 1 refills | Status: DC | PRN

## 2018-10-28 MED ORDER — DOXYCYCLINE HYCLATE 100 MG PO TABS: 100.0000 mg | ORAL_TABLET | Freq: Two times a day (BID) | ORAL | 0 refills | Status: AC

## 2018-10-28 NOTE — Progress Notes (Signed)
Name: SHELL POPOVICH   MRN: [id number]    DOB: Apr 26, 1945   Date:10/28/2018       Progress Note  Subjective  Chief Complaint  Chief Complaint  Patient presents with  . Annual Exam    HPI  Patient presents for annual CPE.  USPSTF grade A and B recommendations:  Diet: Balanced; eating plenty per his report Exercise: Stays very active, walking daily.  Depression: phq 9 is negative Depression screen [facility] 2/9 10/28/2018 05/09/2018 05/24/2017 05/10/2017 03/27/2016  Decreased Interest 0 0 0 1 0  Down, Depressed, Hopeless 0 0 1 1 0  PHQ - 2 Score 0 0 1 2 0  Altered sleeping 0 - 1 1 -  Tired, decreased energy 0 - 1 1 -  Change in appetite 0 - 1 3 -  Feeling bad or failure about yourself  0 - 0 0 -  Trouble concentrating 0 - 0 0 -  Moving slowly or fidgety/restless 0 - 0 3 -  Suicidal thoughts 0 - 0 0 -  PHQ-9 Score 0 - 4 10 -  Difficult doing work/chores Not difficult at all - Not difficult at all Somewhat difficult -    Hypertension:  BP Readings from Last 3 Encounters:  10/28/18 130/70  07/09/18 127/88  05/09/18 120/74    Obesity: Wt Readings from Last 3 Encounters:  10/28/18 116 lb 11.2 oz (52.9 kg)  07/09/18 130 lb (59 kg)  05/09/18 120 lb (54.4 kg)   BMI Readings from Last 3 Encounters:  10/28/18 16.74 kg/m  07/09/18 18.92 kg/m  05/09/18 17.47 kg/m     Lipids:  Lab Results  Component Value Date   CHOL 164 05/24/2017   CHOL 162 03/27/2016   CHOL 158 07/29/2015   Lab Results  Component Value Date   HDL 51 05/24/2017   HDL 48 03/27/2016   HDL 47 07/29/2015   Lab Results  Component Value Date   LDLCALC 94 05/24/2017   LDLCALC 87 03/27/2016   LDLCALC 71 07/29/2015   Lab Results  Component Value Date   TRIG 95 05/24/2017   TRIG 134 03/27/2016   TRIG 201 (H) 07/29/2015   Lab Results  Component Value Date   CHOLHDL 3.2 05/24/2017   CHOLHDL 3.4 03/27/2016   No results found for: LDLDIRECT Glucose:  Glucose  Date Value Ref Range Status   08/07/2013 90 65 - 99 mg/dL Final  08/05/2013 92 65 - 99 mg/dL Final  08/04/2013 86 65 - 99 mg/dL Final   Glucose, Bld  Date Value Ref Range Status  05/24/2017 80 65 - 99 mg/dL Final    Comment:    .            Fasting reference interval .   03/27/2016 73 65 - 99 mg/dL Final  07/19/2015 68 65 - 99 mg/dL Final      Office Visit from 10/28/2018 in [facility]  AUDIT-C Score  0    No longer drinks any alcohol  Married STD testing and prevention (HIV/chl/gon/syphilis): HIV and RPR were negative in 2018; offered additional screening and he would like to have this done today.  He denies any new sexual partners in at least 41 years.  Hep C: Negative in 2018.  Skin cancer: No concerning moles or lesions for skin cancer; however he does note a lesion on the right groin area, present for about a month; states area is sore and a bit swollen/ Colorectal cancer: UTD on colonoscopy.  Denies family  or personal history of colorectal cancer, no changes in BM's - no blood in stool, dark and tarry stool, mucus in stool, or constipation/diarrhea. Prostate cancer: Will check today. Paternal grandfather with history of prostate cancer. Lab Results  Component Value Date   PSA 0.4 03/27/2016   PSA 0.43 07/19/2015   IPSS Questionnaire (AUA-7): Over the past month.   1)  How often have you had a sensation of not emptying your bladder completely after you finish urinating?  1 - Less than 1 time in 5  2)  How often have you had to urinate again less than two hours after you finished urinating? 1 - Less than 1 time in 5  3)  How often have you found you stopped and started again several times when you urinated?  2 - Less than half the time  4) How difficult have you found it to postpone urination?  0 - Not at all  5) How often have you had a weak urinary stream?  1 - Less than 1 time in 5  6) How often have you had to push or strain to begin urination?  0 - Not at all  7) How many  times did you most typically get up to urinate from the time you went to bed until the time you got up in the morning?  0 - None  Total score:  0-7 mildly symptomatic   8-19 moderately symptomatic   20-35 severely symptomatic  Score of 5 today.    Lung cancer:  Smoking since age 20; used to smoke 1ppd; now down to 3-4 cigarettes/day Low Dose CT Chest recommended if Age 46-80 years, 30 pack-year currently smoking OR have quit w/in 15years. Patient does qualify.   AAA: Will order today; The USPSTF recommends one-time screening with ultrasonography in men ages 56 to 72 years who have ever smoked ECG:  On file.  Denies chest pain, shortness of breath, or palptiations.  Advanced Care Planning: A voluntary discussion about advance care planning including the explanation and discussion of advance directives.  Discussed health care proxy and Living will, and the patient was able to identify a health care proxy as Sports administrator.  Patient does not have a living will at present time. If patient does have living will, I have requested they bring this to the clinic to be scanned in to their chart.  Patient Active Problem List   Diagnosis Date Noted  . Polyp of ascending colon   . Atherosclerosis of aorta ([place]) 05/23/2017  . Osteopenia 08/29/2016  . Weakness 04/03/2016  . Screen for STD (sexually transmitted disease) 03/27/2016  . Underweight 03/27/2016  . Cigarette smoker one half pack a day or less 09/24/2015  . Incidental lung nodule, > 34mm and < 45mm 08/03/2015  . Centrilobular emphysema ([place]) 08/03/2015  . Right Supraclavicular Lymph node 08/03/2015  . Coronary artery disease 07/29/2015  . Carotid artery calcification 07/29/2015  . 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 Abnormal chest CT 07/29/2015  . Coronary artery calcification seen on CAT scan 07/29/2015  . Lymphadenopathy of right cervical region 07/19/2015  . Prostate cancer screening 07/19/2015  . Aortic calcification ([place])  07/10/2015  . Benign neoplasm of transverse colon   . Benign neoplasm of descending colon   . Benign neoplasm of sigmoid colon   . Preventative health care 08/27/2014  . Cataract   . Tobacco abuse   . History of colonic polyps   . Hypochloremia   . Low sodium levels 08/18/2013  .  Back pain without radiation 08/18/2013  . Hyposmolality and/or hyponatremia 08/18/2013  . Guillain Barr syndrome ([place]) 08/11/2013    Past Surgical History:  Procedure Laterality Date  . COLONOSCOPY WITH PROPOFOL N/A 09/22/2014   Procedure: COLONOSCOPY WITH PROPOFOL;  Surgeon: Lucilla Lame, MD;  Location: [facility] ENDOSCOPY;  Service: Endoscopy;  Laterality: N/A;  . COLONOSCOPY WITH PROPOFOL N/A 07/09/2018   Procedure: COLONOSCOPY WITH PROPOFOL;  Surgeon: Lucilla Lame, MD;  Location: [facility] ENDOSCOPY;  Service: Endoscopy;  Laterality: N/A;  . DEEP NECK LYMPH NODE BIOPSY / EXCISION Right   . HERNIA REPAIR  1988  . LYMPH NODE BIOPSY    . NM MYOVIEW ([place] [facility])  10/09/2013   Dr. Lujean Amel: EF 58%. Normal wall motion. No ischemia or infarction.  . shoulder    . SHOULDER ARTHROSCOPY Right   . TRANSTHORACIC ECHOCARDIOGRAM  09/24/2013   EF 45-50% with mild global hypokinesis. GR 1 DD. No valvular lesions besides mild TR. Normal RV pressures.    Family History  Problem Relation Age of Onset  . Alcohol abuse Mother   . Healthy Daughter   . Parkinson's disease Other   . Stroke Other   . Diabetes Sister   . Heart disease Other   . Heart disease Other   . Alcohol abuse Brother   . Cancer Paternal Grandfather        pancreatic  . Heart disease Sister   . Heart disease Sister   . Healthy Son   . Healthy Daughter   . Bladder Cancer Neg Hx   . Kidney cancer Neg Hx     Social History   Socioeconomic History  . Marital status: Married    Spouse name: [name]  . Number of children: 3  . Years of education: some college  . Highest education level: 12th grade  Occupational History  . Occupation: Retired   Scientific laboratory technician  . Financial resource strain: Not hard at all  . Food insecurity    Worry: Never true    Inability: Never true  . Transportation needs    Medical: No    Non-medical: No  Tobacco Use  . Smoking status: Current Some Day Smoker    Packs/day: 0.25    Years: 50.00    Pack years: 12.50    Types: Cigarettes  . Smokeless tobacco: Never Used  . Tobacco comment: smokes 4 cig weekly.  Substance and Sexual Activity  . Alcohol use: No  . Drug use: No  . Sexual activity: Not Currently    Partners: Female  Lifestyle  . Physical activity    Days per week: 7 days    Minutes per session: 20 min  . Stress: Not at all  Relationships  . Social connections    Talks on phone: More than three times a week    Gets together: Never    Attends religious service: More than 4 times per year    Active member of club or organization: No    Attends meetings of clubs or organizations: Never    Relationship status: Married  . Intimate partner violence    Fear of current or ex partner: No    Emotionally abused: No    Physically abused: No    Forced sexual activity: No  Other Topics Concern  . Not on file  Social History Narrative   Current tobacco user, 2-3 cigarettes per day for the past 4-5 months, previously 2 packs per day for 85 years, formerly worked as a Administrator, has  dogs at home.     Current Outpatient Medications:  .  acetaminophen (TYLENOL) 325 MG tablet, Take 1 tablet by mouth every 6 (six) hours as needed., Disp: , Rfl:  .  albuterol (PROVENTIL HFA;VENTOLIN HFA) 108 (90 Base) MCG/ACT inhaler, Inhale 2 puffs into the lungs every 4 (four) hours as needed for wheezing or shortness of breath., Disp: 1 Inhaler, Rfl: 6 .  aspirin EC 81 MG EC tablet, Take 1 tablet (81 mg total) by mouth daily., Disp: , Rfl:  .  atorvastatin (LIPITOR) 10 MG tablet, Take 1 tablet (10 mg total) by mouth at bedtime., Disp: 30 tablet, Rfl: 1 .  gabapentin (NEURONTIN) 100 MG capsule, Take 1 capsule  by mouth 3 (three) times daily. States he takes as needed, Disp: , Rfl:  .  mirtazapine (REMERON) 15 MG tablet, Take 0.5 tablets (7.5 mg total) by mouth at bedtime., Disp: 15 tablet, Rfl: 0  Allergies  Allergen Reactions  . Pneumococcal Vaccine Other (See Comments)    GUILLAIN BARRE SYNDROME NUMBNESS, SOB,CHEST PAIN   . Prevnar [Pneumococcal 13-Val Conj Vacc] Other (See Comments)    GUILLAIN-BARRE SYNDROME  . Influenza Vaccines     GUILLAIN BARRE SYNDROME      ROS  Constitutional: Negative for fever or weight change.  Respiratory: Negative for cough and shortness of breath.   Cardiovascular: Negative for chest pain or palpitations.  Gastrointestinal: Negative for abdominal pain, no bowel changes.  Musculoskeletal: Negative for gait problem or joint swelling.  Skin: Negative for rash.  Neurological: Negative for dizziness or headache.  No other specific complaints in a complete review of systems (except as listed in HPI above).  Objective  Vitals:   10/28/18 0946  BP: 130/70  Pulse: 89  Resp: 16  Temp: 97.8 F (36.6 C)  TempSrc: Oral  SpO2: 99%  Weight: 116 lb 11.2 oz (52.9 kg)  Height: 5\' 10"  (1.778 m)    Body mass index is 16.74 kg/m.  Physical Exam  Constitutional: Patient appears well-developed and well-nourished. No distress.  HENT: Head: Normocephalic and atraumatic. Ears: B TMs ok, no erythema or effusion; Nose: Nose normal. Mouth/Throat: Oropharynx is clear and moist. No oropharyngeal exudate.  Eyes: Conjunctivae and EOM are normal. Pupils are equal, round, and reactive to light. No scleral icterus.  Neck: Normal range of motion. Neck supple. No JVD present. No thyromegaly present.  Cardiovascular: Normal rate, regular rhythm and normal heart sounds.  No murmur heard. No BLE edema. Pulmonary/Chest: Effort normal and breath sounds normal. No respiratory distress. Abdominal: Soft. Bowel sounds are normal, no distension. There is no tenderness. no  masses MALE GENITALIA: Normal descended testes bilaterally, no masses palpated, no hernias, no discharge RECTAL: Prostate normal size and consistency, no rectal masses or hemorrhoids Musculoskeletal: Normal range of motion, no joint effusions. No gross deformities Neurological: he is alert and oriented to person, place, and time. No cranial nerve deficit. Coordination, balance, strength, speech and gait are normal.  Skin: Skin is warm and dry. No rash noted. No erythema.  RIGHT Groin has very small (approx 2cm x1cm oblong cystic lesion that is slightly open on the top with no purulent drainage, scant sanguinous drainage.  The area is non-tender and minimal fluctuance, no erythema. Psychiatric: Patient has a normal mood and affect. behavior is normal. Judgment and thought content normal.   No results found for this or any previous visit (from the past 2160 hour(s)).   PHQ2/9: Depression screen [facility] 2/9 10/28/2018 05/09/2018 05/24/2017 05/10/2017 03/27/2016  Decreased Interest 0 0 0 1 0  Down, Depressed, Hopeless 0 0 1 1 0  PHQ - 2 Score 0 0 1 2 0  Altered sleeping 0 - 1 1 -  Tired, decreased energy 0 - 1 1 -  Change in appetite 0 - 1 3 -  Feeling bad or failure about yourself  0 - 0 0 -  Trouble concentrating 0 - 0 0 -  Moving slowly or fidgety/restless 0 - 0 3 -  Suicidal thoughts 0 - 0 0 -  PHQ-9 Score 0 - 4 10 -  Difficult doing work/chores Not difficult at all - Not difficult at all Somewhat difficult -    Fall Risk: Fall Risk  10/28/2018 05/09/2018 05/24/2017 05/10/2017 03/27/2016  Falls in the past year? 0 0 No No No  Number falls in past yr: 0 0 - - -  Injury with Fall? 0 0 - - -  Risk for fall due to : - - - Impaired vision -  Risk for fall due to: Comment - - - wears eyeglasses,cataracts -  Follow up Falls evaluation completed Falls prevention discussed - - -     Assessment & Plan  1. Annual physical exam -Prostate cancer screening and PSA options (with potential risks and  benefits of testing vs not testing) were discussed along with recent recs/guidelines. -USPSTF grade A and B recommendations reviewed with patient; age-appropriate recommendations, preventive care, screening tests, etc discussed and encouraged; healthy living encouraged; see AVS for patient education given to patient -Discussed importance of 150 minutes of physical activity weekly, eat two servings of fish weekly, eat one serving of tree nuts ( cashews, pistachios, pecans, almonds.Marland Kitchen) every other day, eat 6 servings of fruit/vegetables daily and drink plenty of water and avoid sweet beverages.  - CT CHEST LUNG CA SCREEN LOW DOSE W/O CM; Future - [place] AORTA MEDICARE SCREENING; Future - COMPLETE METABOLIC PANEL WITH GFR - Lipid panel - RPR - PSA - HIV Antibody (routine testing w rflx) - Urine cytology ancillary only  2. Encounter for screening for malignant neoplasm of respiratory organs - CT CHEST LUNG CA SCREEN LOW DOSE W/O CM; Future  3. Encounter for abdominal aortic aneurysm (AAA) screening - [place] AORTA MEDICARE SCREENING; Future  4. Atherosclerosis of aorta (HCC) - Lipid panel  5. Prostate cancer screening - PSA  6. Diabetes mellitus screening - COMPLETE METABOLIC PANEL WITH GFR  7. Guillain Barr syndrome (H[facility]) - gabapentin (NEURONTIN) 100 MG capsule; Take 1 capsule (100 mg total) by mouth daily as needed. States he takes only as needed  Dispense: 90 capsule; Refill: 1  8. Routine screening for STI (sexually transmitted infection) - RPR - HIV Antibody (routine testing w rflx) - Urine cytology ancillary only  9. Abscess - Will trial Doxy for 7 days; if not improving he will call back and we may consider referral to surgery for additional eval. - doxycycline (VIBRA-TABS) 100 MG tablet; Take 1 tablet (100 mg total) by mouth 2 (two) times daily for 7 days.  Dispense: 14 tablet; Refill: 0

## 2018-10-28 NOTE — Telephone Encounter (Signed)
Received referral for initial lung cancer screening scan. Contacted patient and obtained smoking history,(current, 56 pack year) as well as answering questions related to screening process. Patient denies signs of lung cancer such as weight loss or hemoptysis. Patient denies comorbidity that would prevent curative treatment if lung cancer were found. Patient is scheduled for shared decision making visit and CT scan on 11/07/18 at 2pm.

## 2018-10-30 LAB — COMPLETE METABOLIC PANEL WITH GFR
AG Ratio: 1.5 (calc) (ref 1.0–2.5)
ALT: 27 U/L (ref 9–46)
AST: 28 U/L (ref 10–35)
Albumin: 4.6 g/dL (ref 3.6–5.1)
Alkaline phosphatase (APISO): 73 U/L (ref 35–144)
BUN: 15 mg/dL (ref 7–25)
CO2: 27 mmol/L (ref 20–32)
Calcium: 9.6 mg/dL (ref 8.6–10.3)
Chloride: 100 mmol/L (ref 98–110)
Creat: 0.75 mg/dL (ref 0.70–1.18)
GFR, Est African American: 106 mL/min/{1.73_m2} (ref >=60)
GFR, Est Non African American: 92 mL/min/{1.73_m2} (ref >=60)
Globulin: 3.1 g/dL (calc) (ref 1.9–3.7)
Glucose, Bld: 84 mg/dL (ref 65–99)
Potassium: 4.5 mmol/L (ref 3.5–5.3)
Sodium: 137 mmol/L (ref 135–146)
Total Bilirubin: 0.5 mg/dL (ref 0.2–1.2)
Total Protein: 7.7 g/dL (ref 6.1–8.1)

## 2018-10-30 LAB — RPR: RPR Ser Ql: NONREACTIVE

## 2018-10-30 LAB — HIV ANTIBODY (ROUTINE TESTING W REFLEX): HIV 1&2 Ab, 4th Generation: NONREACTIVE

## 2018-10-30 LAB — LIPID PANEL
Cholesterol: 187 mg/dL (ref <200)
HDL: 59 mg/dL (ref >=40)
LDL Cholesterol (Calc): 109 mg/dL (calc) — ABNORMAL HIGH
Non-HDL Cholesterol (Calc): 128 mg/dL (calc) (ref <130)
Total CHOL/HDL Ratio: 3.2 (calc) (ref <5.0)
Triglycerides: 97 mg/dL (ref <150)

## 2018-10-30 LAB — PSA: PSA: 0.4 ng/mL (ref <=4.0)

## 2018-10-31 LAB — URINE CYTOLOGY ANCILLARY ONLY
Chlamydia: NEGATIVE
Comment: NEGATIVE
Comment: NORMAL
Neisseria Gonorrhea: NEGATIVE

## 2018-11-01 ENCOUNTER — Other Ambulatory Visit: Payer: Self-pay | Admitting: Family Medicine

## 2018-11-01 DIAGNOSIS — E7800 Pure hypercholesterolemia, unspecified: Secondary | ICD-10-CM

## 2018-11-01 DIAGNOSIS — I2510 Atherosclerotic heart disease of native coronary artery without angina pectoris: Secondary | ICD-10-CM

## 2018-11-01 DIAGNOSIS — I6529 Occlusion and stenosis of unspecified carotid artery: Secondary | ICD-10-CM

## 2018-11-01 DIAGNOSIS — I700 Atherosclerosis of aorta: Secondary | ICD-10-CM

## 2018-11-01 MED ORDER — ATORVASTATIN CALCIUM 20 MG PO TABS: 20.0000 mg | ORAL_TABLET | Freq: Every day | ORAL | 3 refills | Status: DC

## 2018-11-07 ENCOUNTER — Inpatient Hospital Stay: Payer: Medicare Other | Attending: Nurse Practitioner | Admitting: Hospice and Palliative Medicine

## 2018-11-07 ENCOUNTER — Other Ambulatory Visit: Payer: Self-pay

## 2018-11-07 ENCOUNTER — Ambulatory Visit
Admission: RE | Admit: 2018-11-07 | Discharge: 2018-11-07 | Disposition: A | Discharge status: Home / Self Care | Payer: Medicare Other | Source: Ambulatory Visit | Attending: Nurse Practitioner | Admitting: Nurse Practitioner

## 2018-11-07 ENCOUNTER — Encounter: Payer: Self-pay | Admitting: Nurse Practitioner

## 2018-11-07 DIAGNOSIS — Z87891 Personal history of nicotine dependence: Secondary | ICD-10-CM

## 2018-11-07 DIAGNOSIS — Z122 Encounter for screening for malignant neoplasm of respiratory organs: Secondary | ICD-10-CM | POA: Insufficient documentation

## 2018-11-07 NOTE — Progress Notes (Signed)
In accordance with CMS guidelines, patient has met eligibility criteria including age, absence of signs or symptoms of lung cancer.  Social History   Tobacco Use  . Smoking status: Current Some Day Smoker    Packs/day: 1.00    Years: 56.00    Pack years: 56.00    Types: Cigarettes  . Smokeless tobacco: Never Used  . Tobacco comment: smokes 4 cig weekly.  Substance Use Topics  . Alcohol use: No  . Drug use: No      A shared decision-making session was conducted prior to the performance of CT scan. This includes one or more decision aids, includes benefits and harms of screening, follow-up diagnostic testing, over-diagnosis, false positive rate, and total radiation exposure.   Counseling on the importance of adherence to annual lung cancer LDCT screening, impact of co-morbidities, and ability or willingness to undergo diagnosis and treatment is imperative for compliance of the program.   Counseling on the importance of continued smoking cessation for former smokers; the importance of smoking cessation for current smokers, and information about tobacco cessation interventions have been given to patient including [place] and 1800 quit [place] programs.   Written order for lung cancer screening with LDCT has been given to the patient and any and all questions have been answered to the best of my abilities.    Yearly follow up will be coordinated by Burgess Estelle, Thoracic Navigator.  Time Total: 15 minutes  Visit consisted of counseling and education dealing with complex health screening. Greater than 50%  of this time was spent counseling and coordinating care related to the above assessment and plan.  Signed by: Altha Harm, PhD, NP-C [phone] (Work Cell)

## 2018-11-11 ENCOUNTER — Encounter: Payer: Self-pay | Admitting: *Deleted

## 2019-01-28 ENCOUNTER — Ambulatory Visit (INDEPENDENT_AMBULATORY_CARE_PROVIDER_SITE_OTHER): Payer: Medicare Other | Admitting: Family Medicine

## 2019-01-28 ENCOUNTER — Encounter: Payer: Self-pay | Admitting: Family Medicine

## 2019-01-28 ENCOUNTER — Other Ambulatory Visit: Payer: Self-pay

## 2019-01-28 DIAGNOSIS — R252 Cramp and spasm: Secondary | ICD-10-CM

## 2019-01-28 DIAGNOSIS — E7800 Pure hypercholesterolemia, unspecified: Secondary | ICD-10-CM

## 2019-01-28 DIAGNOSIS — Z720 Tobacco use: Secondary | ICD-10-CM

## 2019-01-28 DIAGNOSIS — I6529 Occlusion and stenosis of unspecified carotid artery: Secondary | ICD-10-CM

## 2019-01-28 DIAGNOSIS — J432 Centrilobular emphysema: Secondary | ICD-10-CM

## 2019-01-28 DIAGNOSIS — I2510 Atherosclerotic heart disease of native coronary artery without angina pectoris: Secondary | ICD-10-CM

## 2019-01-28 DIAGNOSIS — G610 Guillain-Barre syndrome: Secondary | ICD-10-CM

## 2019-01-28 DIAGNOSIS — I700 Atherosclerosis of aorta: Secondary | ICD-10-CM

## 2019-01-28 MED ORDER — ATORVASTATIN CALCIUM 20 MG PO TABS: 20.0000 mg | ORAL_TABLET | Freq: Every day | ORAL | 3 refills | Status: DC

## 2019-01-28 NOTE — Progress Notes (Signed)
Name: Brent [name]   MRN: [id number]    DOB: Dec 10, 1945   Date:01/28/2019       Progress Note  Subjective  Chief Complaint  Chief Complaint  Patient presents with  . Follow-up  . Medication Refill    atorvastatin  . Hyperlipidemia    I connected with  Thayer Dallas Hodgkin on 01/28/19 at 10:00 AM EST by telephone and verified that I am speaking with the correct person using two identifiers.  I discussed the limitations, risks, security and privacy concerns of performing an evaluation and management service by telephone and the availability of in person appointments. Staff also discussed with the patient that there may be a patient responsible charge related to this service. Patient Location: Home Provider Location: Office Additional Individuals present: None  HPI  HLD/Aortic Atherosclerosis/CAD:  Taking atorvastatin and ASA 81mg  daily without SE's of medicatoin.  Denies myalgias, chest pain, or shortness of breath.  Guillain Barre Syndrome:  Occurred 4-5 years ago.  He was seeing Dr. Manuella Ghazi for follow up - last visit was August 2019, was told to follow up only PRN.  Does have occasional numbness/tingling in his hands which has been stable. Taking gabapenin 100mg  PRN.   Hand and Foot Cramping: Happening a bit more often over the last month.  Notes last week was not eating much, but has picked back up on his eating this week.  Admits he is likely not drinking enough water.   COPD/Tobacco Abuse: Seeing Dr. Alva Garnet with Pulmonology. Has occasional shortness of breath with over exertion.  Has albuterol PRN.  He has decreased his cigarette use to about 3-4 cigarettes a day unless stressed (down from 1ppd).  Congratulated on this progress.  Having annual CT lung cancer screening annually - this is UTD.  Patient Active Problem List   Diagnosis Date Noted  . Polyp of ascending colon   . Atherosclerosis of aorta ([place]) 05/23/2017  . Osteopenia 08/29/2016  . Weakness 04/03/2016  .  Screen for STD (sexually transmitted disease) 03/27/2016  . Underweight 03/27/2016  . Cigarette smoker one half pack a day or less 09/24/2015  . Incidental lung nodule, > 18mm and < 36mm 08/03/2015  . Centrilobular emphysema ([place]) 08/03/2015  . Right Supraclavicular Lymph node 08/03/2015  . Coronary artery disease 07/29/2015  . Carotid artery calcification 07/29/2015  . 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[place]) 07/29/2015  . Abnormal chest CT 07/29/2015  . Coronary artery calcification seen on CAT scan 07/29/2015  . Lymphadenopathy of right cervical region 07/19/2015  . Prostate cancer screening 07/19/2015  . Aortic calcification ([place]) 07/10/2015  . Benign neoplasm of transverse colon   . Benign neoplasm of descending colon   . Benign neoplasm of sigmoid colon   . Preventative health care 08/27/2014  . Cataract   . Tobacco abuse   . History of colonic polyps   . Hypochloremia   . Low sodium levels 08/18/2013  . Back pain without radiation 08/18/2013  . Hyposmolality and/or hyponatremia 08/18/2013  . Guillain Barr syndrome ([place]) 08/11/2013    Past Surgical History:  Procedure Laterality Date  . COLONOSCOPY WITH PROPOFOL N/A 09/22/2014   Procedure: COLONOSCOPY WITH PROPOFOL;  Surgeon: Lucilla Lame, MD;  Location: [facility] ENDOSCOPY;  Service: Endoscopy;  Laterality: N/A;  . COLONOSCOPY WITH PROPOFOL N/A 07/09/2018   Procedure: COLONOSCOPY WITH PROPOFOL;  Surgeon: Lucilla Lame, MD;  Location: [facility] ENDOSCOPY;  Service: Endoscopy;  Laterality: N/A;  . DEEP NECK LYMPH NODE BIOPSY / EXCISION Right   . HERNIA REPAIR  1988  . LYMPH NODE BIOPSY    . NM MYOVIEW ([place] [facility])  10/09/2013   Dr. Lujean Amel: EF 58%. Normal wall motion. No ischemia or infarction.  . shoulder    . SHOULDER ARTHROSCOPY Right   . TRANSTHORACIC ECHOCARDIOGRAM  09/24/2013   EF 45-50% with mild global hypokinesis. GR 1 DD. No valvular lesions besides mild TR. Normal RV pressures.    Family History  Problem Relation Age of Onset    . Alcohol abuse Mother   . Healthy Daughter   . Parkinson's disease Other   . Stroke Other   . Diabetes Sister   . Heart disease Other   . Heart disease Other   . Alcohol abuse Brother   . Cancer Paternal Grandfather        pancreatic  . Heart disease Sister   . Heart disease Sister   . Healthy Son   . Healthy Daughter   . Bladder Cancer Neg Hx   . Kidney cancer Neg Hx     Social History   Socioeconomic History  . Marital status: Married    Spouse name: [name]  . Number of children: 3  . Years of education: some college  . Highest education level: 12th grade  Occupational History  . Occupation: Retired  Tobacco Use  . Smoking status: Current Some Day Smoker    Packs/day: 1.00    Years: 56.00    Pack years: 56.00    Types: Cigarettes  . Smokeless tobacco: Never Used  . Tobacco comment: smokes 4 cig weekly.  Substance and Sexual Activity  . Alcohol use: No  . Drug use: No  . Sexual activity: Not Currently    Partners: Female  Other Topics Concern  . Not on file  Social History Narrative   Current tobacco user, 2-3 cigarettes per day for the past 4-5 months, previously 2 packs per day for 52 years, formerly worked as a Administrator, has dogs at home.   Social Determinants of Health   Financial Resource Strain:   . Difficulty of Paying Living Expenses: Not on file  Food Insecurity:   . Worried About Charity fundraiser in the Last Year: Not on file  . Ran Out of Food in the Last Year: Not on file  Transportation Needs:   . Lack of Transportation (Medical): Not on file  . Lack of Transportation (Non-Medical): Not on file  Physical Activity:   . Days of Exercise per Week: Not on file  . Minutes of Exercise per Session: Not on file  Stress: No Stress Concern Present  . Feeling of Stress : Not at all  Social Connections: Unknown  . Frequency of Communication with Friends and Family: More than three times a week  . Frequency of Social Gatherings with Friends  and Family: Never  . Attends Religious Services: More than 4 times per year  . Active Member of Clubs or Organizations: No  . Attends Archivist Meetings: Never  . Marital Status: Not on file  Intimate Partner Violence:   . Fear of Current or Ex-Partner: Not on file  . Emotionally Abused: Not on file  . Physically Abused: Not on file  . Sexually Abused: Not on file     Current Outpatient Medications:  .  acetaminophen (TYLENOL) 325 MG tablet, Take 1 tablet by mouth every 6 (six) hours as needed., Disp: , Rfl:  .  albuterol (PROVENTIL HFA;VENTOLIN HFA) 108 (90 Base) MCG/ACT inhaler, Inhale 2 puffs into  the lungs every 4 (four) hours as needed for wheezing or shortness of breath., Disp: 1 Inhaler, Rfl: 6 .  aspirin EC 81 MG EC tablet, Take 1 tablet (81 mg total) by mouth daily., Disp: , Rfl:  .  atorvastatin (LIPITOR) 20 MG tablet, Take 1 tablet (20 mg total) by mouth daily., Disp: 90 tablet, Rfl: 3 .  gabapentin (NEURONTIN) 100 MG capsule, Take 1 capsule (100 mg total) by mouth daily as needed. States he takes only as needed, Disp: 90 capsule, Rfl: 1 .  mirtazapine (REMERON) 15 MG tablet, Take 0.5 tablets (7.5 mg total) by mouth at bedtime. (Patient not taking: Reported on 01/28/2019), Disp: 15 tablet, Rfl: 0  Allergies  Allergen Reactions  . Pneumococcal Vaccine Other (See Comments)    GUILLAIN BARRE SYNDROME NUMBNESS, SOB,CHEST PAIN   . Prevnar [Pneumococcal 13-Val Conj Vacc] Other (See Comments)    GUILLAIN-BARRE SYNDROME  . Influenza Vaccines     GUILLAIN BARRE SYNDROME     I personally reviewed active problem list, medication list, allergies, health maintenance, notes from last encounter, lab results with the patient/caregiver today.   ROS  Constitutional: Negative for fever or weight change.  Respiratory: Negative for cough.  See HPI regarding ShOB Cardiovascular: Negative for chest pain or palpitations.  Gastrointestinal: Negative for abdominal pain, no bowel  changes.  Musculoskeletal: Negative for gait problem or joint swelling.  Skin: Negative for rash.  Neurological: Negative for dizziness or headache.  No other specific complaints in a complete review of systems (except as listed in HPI above).  Objective  Virtual encounter, vitals not obtained.  There is no height or weight on file to calculate BMI.  Physical Exam  Pulmonary/Chest: Effort normal. No respiratory distress. Speaking in complete sentences Neurological: Pt is alert and oriented to person, place, and time. Speech is normal. Psychiatric: Patient has a normal mood and affect. behavior is normal. Judgment and thought content normal.  No results found for this or any previous visit (from the past 72 hour(s)).  PHQ2/9: Depression screen [facility] 2/9 01/28/2019 10/28/2018 05/09/2018 05/24/2017 05/10/2017  Decreased Interest 1 0 0 0 1  Down, Depressed, Hopeless 1 0 0 1 1  PHQ - 2 Score 2 0 0 1 2  Altered sleeping 0 0 - 1 1  Tired, decreased energy 1 0 - 1 1  Change in appetite 1 0 - 1 3  Feeling bad or failure about yourself  1 0 - 0 0  Trouble concentrating 0 0 - 0 0  Moving slowly or fidgety/restless 0 0 - 0 3  Suicidal thoughts 0 0 - 0 0  PHQ-9 Score 5 0 - 4 10  Difficult doing work/chores Not difficult at all Not difficult at all - Not difficult at all Somewhat difficult   PHQ-2/9 Result is negative.    Fall Risk: Fall Risk  01/28/2019 10/28/2018 05/09/2018 05/24/2017 05/10/2017  Falls in the past year? 0 0 0 No No  Number falls in past yr: 0 0 0 - -  Injury with Fall? 0 0 0 - -  Risk for fall due to : - - - - Impaired vision  Risk for fall due to: Comment - - - - wears eyeglasses,cataracts  Follow up - Falls evaluation completed Falls prevention discussed - -   Assessment & Plan  1. Pure hypercholesterolemia - atorvastatin (LIPITOR) 20 MG tablet; Take 1 tablet (20 mg total) by mouth daily.  Dispense: 90 tablet; Refill: 3  2. Carotid artery calcification, unspecified  laterality - atorvastatin (LIPITOR) 20 MG tablet; Take 1 tablet (20 mg total) by mouth daily.  Dispense: 90 tablet; Refill: 3  3. Atherosclerosis of aorta ([facility]) - atorvastatin (LIPITOR) 20 MG tablet; Take 1 tablet (20 mg total) by mouth daily.  Dispense: 90 tablet; Refill: 3  4. Coronary artery disease involving native coronary artery of native heart without angina pectoris - atorvastatin (LIPITOR) 20 MG tablet; Take 1 tablet (20 mg total) by mouth daily.  Dispense: 90 tablet; Refill: 3  5. Guillain Barr syndrome ([place]) - Doing well on PRN Gabapentin only; question in hand/foot cramping related to GB syndrome vs related to electrolyte abnormality or overuse.    6. Centrilobular emphysema ([facility]) - Albuterol PRN  7. Tobacco abuse - Has cut back significantly, not really to quit.  8. Cramping of hands - Will check electrolytes, also advised to drink plenty of water, and to avoid overuse of hands and feet. Rest when needed throughout the day.  Possibly cramps are secondary to GB syndrome as above. - BASIC METABOLIC PANEL WITH GFR - Magnesium  9. Nocturnal foot cramps - BASIC METABOLIC PANEL WITH GFR - Magnesium   I discussed the assessment and treatment plan with the patient. The patient was provided an opportunity to ask questions and all were answered. The patient agreed with the plan and demonstrated an understanding of the instructions.   The patient was advised to call back or seek an in-person evaluation if the symptoms worsen or if the condition fails to improve as anticipated.  I provided 15 minutes of non-face-to-face time during this encounter.  Hubbard Hartshorn, FNP

## 2019-01-31 LAB — BASIC METABOLIC PANEL WITH GFR
BUN: 11 mg/dL (ref 7–25)
CO2: 29 mmol/L (ref 20–32)
Calcium: 9.6 mg/dL (ref 8.6–10.3)
Chloride: 100 mmol/L (ref 98–110)
Creat: 0.77 mg/dL (ref 0.70–1.18)
GFR, Est African American: 105 mL/min/{1.73_m2} (ref >=60)
GFR, Est Non African American: 91 mL/min/{1.73_m2} (ref >=60)
Glucose, Bld: 100 mg/dL — ABNORMAL HIGH (ref 65–99)
Potassium: 4.3 mmol/L (ref 3.5–5.3)
Sodium: 136 mmol/L (ref 135–146)

## 2019-01-31 LAB — MAGNESIUM: Magnesium: 2 mg/dL (ref 1.5–2.5)

## 2019-03-03 ENCOUNTER — Telehealth: Payer: Self-pay | Admitting: Family Medicine

## 2019-03-03 NOTE — Telephone Encounter (Signed)
Copied from [place] [id number]. Topic: General - Other >> Mar 03, 2019  4:02 PM Keene Breath wrote: Reason for CRM: Called to see if paperwork for request for medical records was picked up by [employer].  Please advise and call to discuss at [phone]

## 2019-03-04 NOTE — Telephone Encounter (Signed)
No I have not seen the paper work and its nothing in his chart showing that it has been done

## 2019-03-04 NOTE — Telephone Encounter (Signed)
Have you seen this paperwork?

## 2019-04-21 ENCOUNTER — Other Ambulatory Visit: Payer: Self-pay | Admitting: Family Medicine

## 2019-04-21 DIAGNOSIS — G610 Guillain-Barre syndrome: Secondary | ICD-10-CM

## 2019-05-05 ENCOUNTER — Telehealth: Payer: Self-pay | Admitting: Family Medicine

## 2019-05-05 NOTE — Telephone Encounter (Signed)
Left message for patient to schedule Annual Wellness Visit.  Please schedule with Nurse Health Advisor Victoria Britt, RN at [facility] [place]  

## 2019-05-20 ENCOUNTER — Ambulatory Visit (INDEPENDENT_AMBULATORY_CARE_PROVIDER_SITE_OTHER): Payer: Medicare PPO

## 2019-05-20 VITALS — BP 119/67 | HR 93 | Ht 70.0 in | Wt 123.0 lb

## 2019-05-20 DIAGNOSIS — Z0000 Encounter for general adult medical examination without abnormal findings: Secondary | ICD-10-CM | POA: Diagnosis not present

## 2019-05-20 NOTE — Progress Notes (Signed)
Subjective:   Brent [name] is a 74 y.o. male who presents for an Initial Medicare Annual Wellness Visit.  Virtual Visit via Telephone Note  I connected with  Brent [name] on 05/20/19 at  8:00 AM EDT by telephone and verified that I am speaking with the correct person using two identifiers.  Medicare Annual Wellness visit completed telephonically due to Covid-19 pandemic.   Location: Patient: home Provider: office   I discussed the limitations, risks, security and privacy concerns of performing an evaluation and management service by telephone and the availability of in person appointments. The patient expressed understanding and agreed to proceed.  Unable to perform video visit due to patient does not have video capability.   Some vital signs may be absent or patient reported.   [name], LPN   Review of Systems   Cardiac Risk Factors include: advanced age (>78men, >63 women);dyslipidemia;male gender;smoking/ tobacco exposure    Objective:    Today's Vitals   05/20/19 0820  BP: 119/67  Pulse: 93  Weight: 123 lb (55.8 kg)  Height: 5\' 10"  (1.778 m)   Body mass index is 17.65 kg/m.  Advanced Directives 05/20/2019 07/09/2018 05/09/2018 05/10/2017 03/27/2016 07/19/2015 09/22/2014  Does Patient Have a Medical Advance Directive? No No No No No No No  Would patient like information on creating a medical advance directive? Yes (MAU/Ambulatory/Procedural Areas - Information given) - Yes (MAU/Ambulatory/Procedural Areas - Information given) Yes (MAU/Ambulatory/Procedural Areas - Information given) - No - patient declined information -  Pre-existing out of facility DNR order (yellow form or pink MOST form) - - - - - - -    Current Medications (verified) Outpatient Encounter Medications as of 05/20/2019  Medication Sig  . acetaminophen (TYLENOL) 325 MG tablet Take 1 tablet by mouth every 6 (six) hours as needed.  Marland Kitchen albuterol (PROVENTIL HFA;VENTOLIN HFA) 108 (90 Base)  MCG/ACT inhaler Inhale 2 puffs into the lungs every 4 (four) hours as needed for wheezing or shortness of breath.  Marland Kitchen aspirin EC 81 MG EC tablet Take 1 tablet (81 mg total) by mouth daily.  Marland Kitchen atorvastatin (LIPITOR) 20 MG tablet Take 1 tablet (20 mg total) by mouth daily.  Marland Kitchen gabapentin (NEURONTIN) 100 MG capsule TAKE ONE CAPSULE BY MOUTH DAILY. ONLY AS NEEDED   No facility-administered encounter medications on file as of 05/20/2019.    Allergies (verified) Pneumococcal vaccine, Prevnar [pneumococcal 13-val conj vacc], and Influenza vaccines   History: Past Medical History:  Diagnosis Date  . Aortic calcification (HCC) 07/2015   Noted on chest CT with contrast  . Cataract   . Centrilobular emphysema ([place]) 08/03/2015   Noted on chest CT July 2017  . 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[place]) 07/29/2015  . Coronary artery calcification seen on CAT scan 07/29/2015   Noted on CT scan July 2017  . 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 Guillain Barr syndrome ([place])   . History of colonic polyps   . Hyperlipidemia   . Hypochloremia   . Osteopenia 08/29/2016   August 2018; next scan August 2020  . Right Supraclavicular Lymph node 08/03/2015  . Solitary pulmonary nodule 08/03/2015   CT Chest 07/2015 - Subpleural 3 mm left upper lobe pulmonary nodule, probably benign.  . Tobacco abuse    Past Surgical History:  Procedure Laterality Date  . COLONOSCOPY WITH PROPOFOL N/A 09/22/2014   Procedure: COLONOSCOPY WITH PROPOFOL;  Surgeon: Lucilla Lame, MD;  Location: [facility] ENDOSCOPY;  Service: Endoscopy;  Laterality:  N/A;  . COLONOSCOPY WITH PROPOFOL N/A 07/09/2018   Procedure: COLONOSCOPY WITH PROPOFOL;  Surgeon: Lucilla Lame, MD;  Location: [facility] ENDOSCOPY;  Service: Endoscopy;  Laterality: N/A;  . DEEP NECK LYMPH NODE BIOPSY / EXCISION Right   . HERNIA REPAIR  1988  . LYMPH NODE BIOPSY    . NM MYOVIEW ([place] HX)  10/09/2013   Dr. Lujean Amel: EF 58%. Normal wall motion. No ischemia or  infarction.  . shoulder    . SHOULDER ARTHROSCOPY Right   . TRANSTHORACIC ECHOCARDIOGRAM  09/24/2013   EF 45-50% with mild global hypokinesis. GR 1 DD. No valvular lesions besides mild TR. Normal RV pressures.   Family History  Problem Relation Age of Onset  . Alcohol abuse Mother   . Healthy Daughter   . Parkinson's disease Other   . Stroke Other   . Diabetes Sister   . Heart disease Other   . Heart disease Other   . Alcohol abuse Brother   . Cancer Paternal Grandfather        pancreatic  . Heart disease Sister   . Heart disease Sister   . Healthy Son   . Healthy Daughter   . Bladder Cancer Neg Hx   . Kidney cancer Neg Hx    Social History   Socioeconomic History  . Marital status: Married    Spouse name: [name]  . Number of children: 3  . Years of education: some college  . Highest education level: 12th grade  Occupational History  . Occupation: Retired  Tobacco Use  . Smoking status: Current Some Day Smoker    Packs/day: 1.00    Years: 56.00    Pack years: 56.00    Types: Cigarettes  . Smokeless tobacco: Never Used  . Tobacco comment: smokes 4 cig weekly.  Substance and Sexual Activity  . Alcohol use: No  . Drug use: No  . Sexual activity: Not Currently    Partners: Female  Other Topics Concern  . Not on file  Social History Narrative   Current tobacco user, 2-3 cigarettes per day for the past 4-5 months, previously 2 packs per day for 52 years, formerly worked as a Administrator, has dogs at home.   Social Determinants of Health   Financial Resource Strain: Medium Risk  . Difficulty of Paying Living Expenses: Somewhat hard  Food Insecurity: No Food Insecurity  . Worried About Charity fundraiser in the Last Year: Never true  . Ran Out of Food in the Last Year: Never true  Transportation Needs: No Transportation Needs  . Lack of Transportation (Medical): No  . Lack of Transportation (Non-Medical): No  Physical Activity: Sufficiently Active  . Days of  Exercise per Week: 7 days  . Minutes of Exercise per Session: 30 min  Stress: No Stress Concern Present  . Feeling of Stress : Only a little  Social Connections: Slightly Isolated  . Frequency of Communication with Friends and Family: More than three times a week  . Frequency of Social Gatherings with Friends and Family: Never  . Attends Religious Services: More than 4 times per year  . Active Member of Clubs or Organizations: No  . Attends Archivist Meetings: Never  . Marital Status: Married   Tobacco Counseling Ready to quit: Not Answered Counseling given: Not Answered Comment: smokes 4 cig weekly.   Clinical Intake:  Pre-visit preparation completed: Yes  Pain : No/denies pain     BMI - recorded: 17.65 Nutritional Status: BMI <  19  Underweight Nutritional Risks: None Diabetes: No  How often do you need to have someone help you when you read instructions, pamphlets, or other written materials from your doctor or pharmacy?: 1 - Never  Interpreter Needed?: No  Information entered by :: [name] LPN  Activities of Daily Living In your present state of health, do you have any difficulty performing the following activities: 05/20/2019 01/28/2019  Hearing? N N  Comment declines hearing aids -  Vision? N N  Difficulty concentrating or making decisions? N Y  Walking or climbing stairs? N N  Dressing or bathing? N N  Doing errands, shopping? N N  Preparing Food and eating ? N -  Using the Toilet? N -  In the past six months, have you accidently leaked urine? N -  Do you have problems with loss of bowel control? N -  Managing your Medications? N -  Managing your Finances? N -  Housekeeping or managing your Housekeeping? N -  Some recent data might be hidden     Immunizations and Health Maintenance Immunization History  Administered Date(s) Administered  . Pneumococcal Conjugate-13 07/25/2013  . Td 02/18/2003  . Tdap 01/09/2009   Health Maintenance Due    Topic Date Due  . COVID-19 Vaccine (1) Never done  . TETANUS/TDAP  01/10/2019    Patient Care Team: Towanda Malkin, MD as PCP - General (Internal Medicine) [name], MD as Consulting Physician (Physical Medicine and Rehabilitation) Vladimir Crofts, MD (Neurology) Yolonda Kida, MD as Consulting Physician (Cardiology) Carloyn Manner, MD as Referring Physician (Otolaryngology) Wilhelmina Mcardle, MD (Inactive) as Consulting Physician (Pulmonary Disease)  Indicate any recent Medical Services you may have received from other than [facility] providers in the past year (date may be approximate).    Assessment:   This is a routine wellness examination for Anikin.  Hearing/Vision screen  Hearing Screening   125Hz  250Hz  500Hz  1000Hz  2000Hz  3000Hz  4000Hz  6000Hz  8000Hz   Right ear:           Left ear:           Comments: Pt denies hearing difficulty  Vision Screening Comments: Annual vision screenings with [facility] on [address]   Dietary issues and exercise activities discussed: Current Exercise Habits: Home exercise routine, Type of exercise: Other - see comments(exercise bike), Time (Minutes): 30, Frequency (Times/Week): 7, Weekly Exercise (Minutes/Week): 210, Intensity: Moderate, Exercise limited by: None identified  Goals    . DIET - INCREASE CALORIC INTAKE     Recommend to increase protein intake and to focus on eating 3 healthy meals and at least 2 healthy snacks per day.    Marland Kitchen DIET - INCREASE WATER INTAKE     Recommend increasing water intake to 6-8 glasses per day       Depression Screen PHQ 2/9 Scores 05/20/2019 01/28/2019 10/28/2018 05/09/2018  PHQ - 2 Score 0 2 0 0  PHQ- 9 Score - 5 0 -    Fall Risk Fall Risk  05/20/2019 01/28/2019 10/28/2018 05/09/2018 05/24/2017  Falls in the past year? 0 0 0 0 No  Number falls in past yr: 0 0 0 0 -  Injury with Fall? 0 0 0 0 -  Risk for fall due to : No Fall Risks - - - -  Risk for fall due to: Comment - - - -  -  Follow up Falls prevention discussed - Falls evaluation completed Falls prevention discussed -    FALL RISK PREVENTION  PERTAINING TO THE HOME:  Any stairs in or around the home? Yes  If so, do they handrails? Yes   Home free of loose throw rugs in walkways, pet beds, electrical cords, etc? Yes  Adequate lighting in your home to reduce risk of falls? Yes   ASSISTIVE DEVICES UTILIZED TO PREVENT FALLS:  Life alert? No  Use of a cane, walker or w/c? No  Grab bars in the bathroom? Yes  Shower chair or bench in shower? Yes  Elevated toilet seat or a handicapped toilet? No   DME ORDERS:  DME order needed?  No   TIMED UP AND GO:  Was the test performed? No . Telephonic visit.   Education: Fall risk prevention has been discussed.  Intervention(s) required? No    Cognitive Function:     6CIT Screen 05/20/2019 05/09/2018 05/10/2017  What Year? 0 points 0 points 0 points  What month? 0 points 0 points 0 points  What time? 0 points 0 points 3 points  Count back from 20 0 points 0 points 0 points  Months in reverse 0 points 0 points 0 points  Repeat phrase 2 points 0 points 0 points  Total Score 2 0 3    Screening Tests Health Maintenance  Topic Date Due  . COVID-19 Vaccine (1) Never done  . TETANUS/TDAP  01/10/2019  . COLONOSCOPY  07/09/2023  . Hepatitis C Screening  Completed  . INFLUENZA VACCINE  Discontinued  . PNA vac Low Risk Adult  Discontinued    Qualifies for Shingles Vaccine? Yes . Due for Shingrix. Education has been provided regarding the importance of this vaccine. Pt has been advised to call insurance company to determine out of pocket expense. Advised may also receive vaccine at local pharmacy or Health Dept. Verbalized acceptance and understanding.  Tdap: Although this vaccine is not a covered service during a Wellness Exam, does the patient still wish to receive this vaccine today?  No .  Education has been provided regarding the importance of this  vaccine. Advised may receive this vaccine at local pharmacy or Health Dept. Aware to provide a copy of the vaccination record if obtained from local pharmacy or Health Dept. Verbalized acceptance and understanding.  Flu Vaccine: n/a  Pneumococcal Vaccine: n/a  Covid-19 Vaccine: Due for Covid-19 vaccine. Education has been provided regarding the importance of this vaccine. Advised may receive this vaccine at local pharmacy, health department or [place] vaccine clinic. Aware to provide a copy of the vaccination record if obtained from local pharmacy or health department. Verbalized acceptance and understanding.    Cancer Screenings:  Colorectal Screening: Completed 07/09/18. Repeat every 5 years;   Lung Cancer Screening: (Low Dose CT Chest recommended if Age 32-80 years, 30 pack-year currently smoking OR have quit w/in 15years.) does qualify. Completed 11/07/18.   Additional Screening:  Hepatitis C Screening: does qualify; Completed 03/27/16  Vision Screening: Recommended annual ophthalmology exams for early detection of glaucoma and other disorders of the eye. Is the patient up to date with their annual eye exam?  Yes  Who is the provider or what is the name of the office in which the pt attends annual eye exams? [facility]   Dental Screening: Recommended annual dental exams for proper oral hygiene  Community Resource Referral:  CRR required this visit?  No        Plan:    I have personally reviewed and addressed the Medicare Annual Wellness questionnaire and have noted the following in the patient's  chart:  A. Medical and social history B. Use of alcohol, tobacco or illicit drugs  C. Current medications and supplements D. Functional ability and status E.  Nutritional status F.  Physical activity G. Advance directives H. List of other physicians I.  Hospitalizations, surgeries, and ER visits in previous 12 months J.  [place] such as hearing and vision if  needed, cognitive and depression L. Referrals and appointments   In addition, I have reviewed and discussed with patient certain preventive protocols, quality metrics, and best practice recommendations. A written personalized care plan for preventive services as well as general preventive health recommendations were provided to patient.   Signed,  [name], LPN Nurse Health Advisor   Nurse Notes: pt would like to discuss Covid-19 vaccine with provider due to history of guillain barre syndrome. Advised pt to schedule appt for July 2021 for 6 month follow up and to establish care with Dr. Roxan Hockey.

## 2019-05-20 NOTE — Patient Instructions (Signed)
Brent [name] , Thank you for taking time to come for your Medicare Wellness Visit. I appreciate your ongoing commitment to your health goals. Please review the following plan we discussed and let me know if I can assist you in the future.   Screening recommendations/referrals: Colonoscopy: done 07/09/18. Repeat in 2025. Recommended yearly ophthalmology/optometry visit for glaucoma screening and checkup Recommended yearly dental visit for hygiene and checkup  Vaccinations: Influenza vaccine: n/a Pneumococcal vaccine: n/a Tdap vaccine: due Shingles vaccine: Shingrix discussed. Please contact your pharmacy for coverage information.  Covid-19: please discuss with provider at next appt  Advanced directives: Advance directive discussed with you today. I have provided a copy for you to complete at home and have notarized. Once this is complete please bring a copy in to our office so we can scan it into your chart.  Conditions/risks identified: Recommend drinking 6-8 glasses of water per day  Next appointment: Please follow up in one year for your Medicare Annual Wellness visit.    Preventive Care 77 Years and Older, Male Preventive care refers to lifestyle choices and visits with your health care provider that can promote health and wellness. What does preventive care include?  A yearly physical exam. This is also called an annual well check.  Dental exams once or twice a year.  Routine eye exams. Ask your health care provider how often you should have your eyes checked.  Personal lifestyle choices, including:  Daily care of your teeth and gums.  Regular physical activity.  Eating a healthy diet.  Avoiding tobacco and drug use.  Limiting alcohol use.  Practicing safe sex.  Taking low doses of aspirin every day.  Taking vitamin and mineral supplements as recommended by your health care provider. What happens during an annual well check? The services and screenings done by  your health care provider during your annual well check will depend on your age, overall health, lifestyle risk factors, and family history of disease. Counseling  Your health care provider may ask you questions about your:  Alcohol use.  Tobacco use.  Drug use.  Emotional well-being.  Home and relationship well-being.  Sexual activity.  Eating habits.  History of falls.  Memory and ability to understand (cognition).  Work and work Statistician. Screening  You may have the following tests or measurements:  Height, weight, and BMI.  Blood pressure.  Lipid and cholesterol levels. These may be checked every 5 years, or more frequently if you are over 42 years old.  Skin check.  Lung cancer screening. You may have this screening every year starting at age 74 if you have a 30-pack-year history of smoking and currently smoke or have quit within the past 15 years.  Fecal occult blood test (FOBT) of the stool. You may have this test every year starting at age 42.  Flexible sigmoidoscopy or colonoscopy. You may have a sigmoidoscopy every 5 years or a colonoscopy every 10 years starting at age 78.  Prostate cancer screening. Recommendations will vary depending on your family history and other risks.  Hepatitis C blood test.  Hepatitis B blood test.  Sexually transmitted disease (STD) testing.  Diabetes screening. This is done by checking your blood sugar (glucose) after you have not eaten for a while (fasting). You may have this done every 1-3 years.  Abdominal aortic aneurysm (AAA) screening. You may need this if you are a current or former smoker.  Osteoporosis. You may be screened starting at age 61 if you are at  high risk. Talk with your health care provider about your test results, treatment options, and if necessary, the need for more tests. Vaccines  Your health care provider may recommend certain vaccines, such as:  Influenza vaccine. This is recommended every  year.  Tetanus, diphtheria, and acellular pertussis (Tdap, Td) vaccine. You may need a Td booster every 10 years.  Zoster vaccine. You may need this after age 60.  Pneumococcal 13-valent conjugate (PCV13) vaccine. One dose is recommended after age 31.  Pneumococcal polysaccharide (PPSV23) vaccine. One dose is recommended after age 67. Talk to your health care provider about which screenings and vaccines you need and how often you need them. This information is not intended to replace advice given to you by your health care provider. Make sure you discuss any questions you have with your health care provider. Document Released: 01/22/2015 Document Revised: 09/15/2015 Document Reviewed: 10/27/2014 Elsevier Interactive Patient Education  2017 [place] Prevention in the Home Falls can cause injuries. They can happen to people of all ages. There are many things you can do to make your home safe and to help prevent falls. What can I do on the outside of my home?  Regularly fix the edges of walkways and driveways and fix any cracks.  Remove anything that might make you trip as you walk through a door, such as a raised step or threshold.  Trim any bushes or trees on the path to your home.  Use bright outdoor lighting.  Clear any walking paths of anything that might make someone trip, such as rocks or tools.  Regularly check to see if handrails are loose or broken. Make sure that both sides of any steps have handrails.  Any raised decks and porches should have guardrails on the edges.  Have any leaves, snow, or ice cleared regularly.  Use sand or salt on walking paths during winter.  Clean up any spills in your garage right away. This includes oil or grease spills. What can I do in the bathroom?  Use night lights.  Install grab bars by the toilet and in the tub and shower. Do not use towel bars as grab bars.  Use non-skid mats or decals in the tub or shower.  If you  need to sit down in the shower, use a plastic, non-slip stool.  Keep the floor dry. Clean up any water that spills on the floor as soon as it happens.  Remove soap buildup in the tub or shower regularly.  Attach bath mats securely with double-sided non-slip rug tape.  Do not have throw rugs and other things on the floor that can make you trip. What can I do in the bedroom?  Use night lights.  Make sure that you have a light by your bed that is easy to reach.  Do not use any sheets or blankets that are too big for your bed. They should not hang down onto the floor.  Have a firm chair that has side arms. You can use this for support while you get dressed.  Do not have throw rugs and other things on the floor that can make you trip. What can I do in the kitchen?  Clean up any spills right away.  Avoid walking on wet floors.  Keep items that you use a lot in easy-to-reach places.  If you need to reach something above you, use a strong step stool that has a grab bar.  Keep electrical cords out of the  way.  Do not use floor polish or wax that makes floors slippery. If you must use wax, use non-skid floor wax.  Do not have throw rugs and other things on the floor that can make you trip. What can I do with my stairs?  Do not leave any items on the stairs.  Make sure that there are handrails on both sides of the stairs and use them. Fix handrails that are broken or loose. Make sure that handrails are as long as the stairways.  Check any carpeting to make sure that it is firmly attached to the stairs. Fix any carpet that is loose or worn.  Avoid having throw rugs at the top or bottom of the stairs. If you do have throw rugs, attach them to the floor with carpet tape.  Make sure that you have a light switch at the top of the stairs and the bottom of the stairs. If you do not have them, ask someone to add them for you. What else can I do to help prevent falls?  Wear shoes  that:  Do not have high heels.  Have rubber bottoms.  Are comfortable and fit you well.  Are closed at the toe. Do not wear sandals.  If you use a stepladder:  Make sure that it is fully opened. Do not climb a closed stepladder.  Make sure that both sides of the stepladder are locked into place.  Ask someone to hold it for you, if possible.  Clearly mark and make sure that you can see:  Any grab bars or handrails.  First and last steps.  Where the edge of each step is.  Use tools that help you move around (mobility aids) if they are needed. These include:  Canes.  Walkers.  Scooters.  Crutches.  Turn on the lights when you go into a dark area. Replace any light bulbs as soon as they burn out.  Set up your furniture so you have a clear path. Avoid moving your furniture around.  If any of your floors are uneven, fix them.  If there are any pets around you, be aware of where they are.  Review your medicines with your doctor. Some medicines can make you feel dizzy. This can increase your chance of falling. Ask your doctor what other things that you can do to help prevent falls. This information is not intended to replace advice given to you by your health care provider. Make sure you discuss any questions you have with your health care provider. Document Released: 10/22/2008 Document Revised: 06/03/2015 Document Reviewed: 01/30/2014 Elsevier Interactive Patient Education  2017 [employer].

## 2019-07-29 ENCOUNTER — Telehealth: Payer: Self-pay

## 2019-07-29 NOTE — Telephone Encounter (Signed)
Some of the patient's Diagnoses from 01/28/19.  Carotid artery calcification, unspecified laterality   Codes: I65.29  Atherosclerosis of aorta (HCC)   Codes: I70.0  Coronary artery disease involving native coronary artery of native heart without angina pectoris   Codes: I25.10  Guillain Barr syndrome ([place])   Codes: G61.0  Centrilobular emphysema     Copied from [place] #[id number]. Topic: General - Other >> Jul 29, 2019  2:45 PM Celene Kras wrote: Reason for CRM: Pt called and is requesting to know if its okay for him to receive the covid shot due to his medical issues. Please advise.

## 2019-07-30 NOTE — Telephone Encounter (Signed)
Please let patient know that yes, he can get the vaccine.  According to the CDC, people who had previously had Guillain-Barre syndrome may receive a Covid vaccine. [facility]

## 2019-07-30 NOTE — Telephone Encounter (Signed)
Returned patient's call, no answer, left vm. 

## 2019-07-31 NOTE — Telephone Encounter (Addendum)
Patient informed no further questions asked.

## 2019-10-28 NOTE — Progress Notes (Signed)
Name: Brent [name]   MRN: [id number]    DOB: 15-Dec-1945   Date:10/29/2019       Progress Note  Subjective  Chief Complaint  Chief Complaint  Patient presents with  . Annual Exam    HPI  Patient is a 74 year old male former patient of Raelyn Ensign Last annual exam was in October 2020 Follows up today for his annual exam. In general, feeling ok Wife present with him  HLD/Aortic Atherosclerosis/CAD:  Taking atorvastatin and ASA 81mg  daily without SE's of medication.  When asked about myalgias, chest pain, or shortness of breath, he noted "sometimes". Denied persistent myalgias that are limiting, also denied marked CP, palp's, SOB  in recent past.  No increased lower extremity swelling.  He noted was more concerned with some persistence of the symptoms after his GBS years ago.  Guillain Barre Syndrome:  Occurred 4-5 years ago.  He was seeing Dr. Manuella Ghazi for follow up - last visit was August 2019, was told to follow up only PRN.  Does have occasional numbness/tingling in his hands that have persisted, not worsening. Taking gabapenin 100mg  PRN.   COPD/Tobacco Abuse: Has occasional shortness of breath with over exertion.  Had albuterol PRN, not had in past year noted. He has decreased his cigarette use to about 2-3 cigarettes a day. Having annual CT lung cancer screening annually - this is UTD.   Low back pain -he noted he had a herniated disc in the past, and occasionally gets some low back pain.  He had an episode in the past week, was bothersome at night in the low back, and then slowly improved.  Denies any pains down the back of the legs, no bowel or bladder symptoms of concern in association.  No foot drop.  No increased weakness in the lower extremities.   USPSTF grade A and B recommendations:  Diet: notes tries to watch and eat healthy, wife helping Exercise:  notes no regular exercise, walks occasionally  Depression: phq 9 - some increased depression sx's, no h/o  counseling or medications, denied suicidal thoughts (notes filled out form so fast, was not paying attention). Noted not significantly different form a year ago. Wife thinks may be more of an issue.  When discussed options, he noted he would prefer to try counseling before any medication. Depression screen [facility] 2/9 10/29/2019 05/20/2019 01/28/2019 10/28/2018 05/09/2018  Decreased Interest 2 0 1 0 0  Down, Depressed, Hopeless 2 0 1 0 0  PHQ - 2 Score 4 0 2 0 0  Altered sleeping 3 - 0 0 -  Tired, decreased energy 3 - 1 0 -  Change in appetite 3 - 1 0 -  Feeling bad or failure about yourself  3 - 1 0 -  Trouble concentrating 0 - 0 0 -  Moving slowly or fidgety/restless 2 - 0 0 -  Suicidal thoughts 2 - 0 0 -  PHQ-9 Score 20 - 5 0 -  Difficult doing work/chores Somewhat difficult - Not difficult at all Not difficult at all -    Hypertension:  BP Readings from Last 3 Encounters:  10/29/19 120/60  05/20/19 119/67  10/28/18 130/70    Obesity: Wt Readings from Last 3 Encounters:  10/29/19 118 lb 6.4 oz (53.7 kg)  05/20/19 123 lb (55.8 kg)  11/07/18 116 lb (52.6 kg)   BMI Readings from Last 3 Encounters:  10/29/19 16.99 kg/m  05/20/19 17.65 kg/m  11/07/18 16.64 kg/m     Lipids:  Lab  Results  Component Value Date   CHOL 187 10/28/2018   CHOL 164 05/24/2017   CHOL 162 03/27/2016   Lab Results  Component Value Date   HDL 59 10/28/2018   HDL 51 05/24/2017   HDL 48 03/27/2016   Lab Results  Component Value Date   LDLCALC 109 (H) 10/28/2018   LDLCALC 94 05/24/2017   LDLCALC 87 03/27/2016   Lab Results  Component Value Date   TRIG 97 10/28/2018   TRIG 95 05/24/2017   TRIG 134 03/27/2016   Lab Results  Component Value Date   CHOLHDL 3.2 10/28/2018   CHOLHDL 3.2 05/24/2017   CHOLHDL 3.4 03/27/2016   No results found for: LDLDIRECT  Glucose:  Glucose  Date Value Ref Range Status  08/07/2013 90 65 - 99 mg/dL Final  08/05/2013 92 65 - 99 mg/dL Final  08/04/2013 86 65  - 99 mg/dL Final   Glucose, Bld  Date Value Ref Range Status  01/31/2019 100 (H) 65 - 99 mg/dL Final    Comment:    .            Fasting reference interval . For someone without known diabetes, a glucose value between 100 and 125 mg/dL is consistent with prediabetes and should be confirmed with a follow-up test. .   10/28/2018 84 65 - 99 mg/dL Final    Comment:    .            Fasting reference interval .   05/24/2017 80 65 - 99 mg/dL Final    Comment:    .            Fasting reference interval .       Clinical Support from 05/20/2019 in [facility]  AUDIT-C Score 0      No longer drinks any alcohol  Married STD testing and prevention (HIV/chl/gon/syphilis):  Had testing done last physical including HIV, RPR, and urine for GC and chlamydia, all negative. He denies any new sexual partners in at least 38 years.  Denies any symptoms of concern. Hep C: Negative in 2018.  Skin cancer: No concerning moles or lesions for skin cancer noted in recent past Colorectal cancer:  Had colonoscopy 07/09/2018 with several polyps removed, tubular adenomas.  Follow-up recommended again in 5 years.  Denies family history of colorectal cancer, no changes in BM's - no blood in stool, dark and tarry stool, mucus in stool, or constipation/diarrhea.  Prostate cancer: Will check today.  Last prostate exam was done a year ago, and no concerns.  Paternal grandfather with history of prostate cancer. Lab Results  Component Value Date   PSA 0.4 10/28/2018   PSA 0.4 03/27/2016   PSA 0.43 07/19/2015  Does not get up overnight to urinate, notes at times has some mild hesitancy, no urgency, no hematuria, denies any dysuria  Lung cancer:  Smoking since age 83; used to smoke 1ppd; now down to 3-4 cigarettes/day  Low Dose CT Chest recommended if Age 29-80 years, 30 pack-year currently smoking OR have quit w/in 15years. Patient does qualify and completed 11/07/2018 with the  following impression:  IMPRESSION: 1. Lung-RADS 2, benign appearance or behavior. Continue annual screening with low-dose chest CT without contrast in 12 months. 2. Cholelithiasis. 3. Aortic Atherosclerosis (ICD10-I70.0) and Emphysema (ICD10-J43.9). Due to have his annual    AAA:  Ordered on visit a year ago, not completed.  the USPSTF recommends one-time screening with ultrasonography in men ages 54 to 85 years  who have ever smoked ECG:  On file.    Advanced Care Planning: A voluntary discussion about advance care planning including the explanation and discussion of advance directives.  Discussed health care proxy and Living will, and the patient was able to identify a health care proxy as wife Sports administrator.  Patient does not have a living will at present time. If patient does have living will, I have requested they bring this to the clinic to be scanned in to their chart.    Patient Active Problem List   Diagnosis Date Noted  . Polyp of ascending colon   . Atherosclerosis of aorta ([place]) 05/23/2017  . Osteopenia 08/29/2016  . Weakness 04/03/2016  . Underweight 03/27/2016  . Cigarette smoker one half pack a day or less 09/24/2015  . Incidental lung nodule, > 69mm and < 18mm 08/03/2015  . Centrilobular emphysema ([place]) 08/03/2015  . Right Supraclavicular Lymph node 08/03/2015  . Coronary artery disease 07/29/2015  . Carotid artery calcification 07/29/2015  . 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 Abnormal chest CT 07/29/2015  . Coronary artery calcification seen on CAT scan 07/29/2015  . Lymphadenopathy of right cervical region 07/19/2015  . Aortic calcification ([place]) 07/10/2015  . Benign neoplasm of transverse colon   . Benign neoplasm of descending colon   . Benign neoplasm of sigmoid colon   . Cataract   . Tobacco abuse   . History of colonic polyps   . Hypochloremia   . Low sodium levels 08/18/2013  . Back pain without radiation 08/18/2013  . Hyposmolality  and/or hyponatremia 08/18/2013  . Guillain Barr syndrome ([place]) 08/11/2013    Past Surgical History:  Procedure Laterality Date  . COLONOSCOPY WITH PROPOFOL N/A 09/22/2014   Procedure: COLONOSCOPY WITH PROPOFOL;  Surgeon: Lucilla Lame, MD;  Location: [facility] ENDOSCOPY;  Service: Endoscopy;  Laterality: N/A;  . COLONOSCOPY WITH PROPOFOL N/A 07/09/2018   Procedure: COLONOSCOPY WITH PROPOFOL;  Surgeon: Lucilla Lame, MD;  Location: [facility] ENDOSCOPY;  Service: Endoscopy;  Laterality: N/A;  . DEEP NECK LYMPH NODE BIOPSY / EXCISION Right   . HERNIA REPAIR  1988  . LYMPH NODE BIOPSY    . NM MYOVIEW ([place] [facility])  10/09/2013   Dr. Lujean Amel: EF 58%. Normal wall motion. No ischemia or infarction.  . shoulder    . SHOULDER ARTHROSCOPY Right   . TRANSTHORACIC ECHOCARDIOGRAM  09/24/2013   EF 45-50% with mild global hypokinesis. GR 1 DD. No valvular lesions besides mild TR. Normal RV pressures.    Family History  Problem Relation Age of Onset  . Alcohol abuse Mother   . Healthy Daughter   . Parkinson's disease Other   . Stroke Other   . Diabetes Sister   . Heart disease Other   . Heart disease Other   . Alcohol abuse Brother   . Cancer Paternal Grandfather        pancreatic  . Heart disease Sister   . Heart disease Sister   . Healthy Son   . Healthy Daughter   . Bladder Cancer Neg Hx   . Kidney cancer Neg Hx     Social History   Socioeconomic History  . Marital status: Married    Spouse name: [name]  . Number of children: 3  . Years of education: some college  . Highest education level: 12th grade  Occupational History  . Occupation: Retired  Tobacco Use  . Smoking status: Current Some Day Smoker    Packs/day: 1.00    Years: 56.00  Pack years: 56.00    Types: Cigarettes  . Smokeless tobacco: Never Used  . Tobacco comment: smokes 4 cig weekly.  Vaping Use  . Vaping Use: Never used  Substance and Sexual Activity  . Alcohol use: No  . Drug use: No  . Sexual activity: Not  Currently    Partners: Female  Other Topics Concern  . Not on file  Social History Narrative   Current tobacco user, 2-3 cigarettes per day for the past 4-5 months, previously 2 packs per day for 52 years, formerly worked as a Administrator, has dogs at home.   Social Determinants of Health   Financial Resource Strain: High Risk  . Difficulty of Paying Living Expenses: Hard  Food Insecurity: Food Insecurity Present  . Worried About Charity fundraiser in the Last Year: Often true  . Ran Out of Food in the Last Year: Never true  Transportation Needs: No Transportation Needs  . Lack of Transportation (Medical): No  . Lack of Transportation (Non-Medical): No  Physical Activity: Insufficiently Active  . Days of Exercise per Week: 3 days  . Minutes of Exercise per Session: 30 min  Stress: Stress Concern Present  . Feeling of Stress : Rather much  Social Connections: Socially Integrated  . Frequency of Communication with Friends and Family: Three times a week  . Frequency of Social Gatherings with Friends and Family: Never  . Attends Religious Services: More than 4 times per year  . Active Member of Clubs or Organizations: Yes  . Attends Archivist Meetings: More than 4 times per year  . Marital Status: Married  Human resources officer Violence: Not At Risk  . Fear of Current or Ex-Partner: No  . Emotionally Abused: No  . Physically Abused: No  . Sexually Abused: No     Current Outpatient Medications:  .  acetaminophen (TYLENOL) 325 MG tablet, Take 1 tablet by mouth every 6 (six) hours as needed., Disp: , Rfl:  .  aspirin EC 81 MG EC tablet, Take 1 tablet (81 mg total) by mouth daily., Disp: , Rfl:  .  atorvastatin (LIPITOR) 20 MG tablet, Take 1 tablet (20 mg total) by mouth daily., Disp: 90 tablet, Rfl: 3 .  gabapentin (NEURONTIN) 100 MG capsule, TAKE ONE CAPSULE BY MOUTH DAILY. ONLY AS NEEDED, Disp: 90 capsule, Rfl: 0 .  albuterol (PROVENTIL HFA;VENTOLIN HFA) 108 (90 Base)  MCG/ACT inhaler, Inhale 2 puffs into the lungs every 4 (four) hours as needed for wheezing or shortness of breath. (Patient not taking: Reported on 10/29/2019), Disp: 1 Inhaler, Rfl: 6  Allergies  Allergen Reactions  . Pneumococcal Vaccine Other (See Comments)    GUILLAIN BARRE SYNDROME NUMBNESS, SOB,CHEST PAIN   . Prevnar [Pneumococcal 13-Val Conj Vacc] Other (See Comments)    GUILLAIN-BARRE SYNDROME  . Influenza Vaccines     GUILLAIN BARRE SYNDROME      ROS: As noted above in HPI Notes has lost about 10 pounds in a year, states at times just does not have a great appetite, No recent fevers or other Covid concerning sx's, not have Covid vaccine No increase headaches, vision changes,  No persistent abdominal pains or change in bowel habits,  No rectal bleeding or dark/black stools,  No flank pains or dysuria,  No lower extremity swelling,   + occas back pain  + numbness, tingling in the extremities Denies other specific complaints on systems review except as noted in HPI    Objective  Vitals:   10/29/19  0805  BP: 120/60  Pulse: 90  Resp: 16  Temp: 98.2 F (36.8 C)  TempSrc: Oral  SpO2: 99%  Weight: 118 lb 6.4 oz (53.7 kg)  Height: 5\' 10"  (1.778 m)    Body mass index is 16.99 kg/m.  NAD, masked, pleasant, thin appearing HEENT - sclera anicteric, PERRL, EOMI, positive glasses, positive arcus, conj - non-inj'ed, TM's and canals clear, pharynx clear Neck - supple, no adenopathy, no TM, no JVD, carotids 2+ and = without bruits bilat Car - RRR without m/g/r Pulm- CTA without wheeze or rales Abd - soft, NT, ND, BS+, no obvious HSM, no masses Back - no CVA tenderness, no spinal tenderness to palpation today, (notes he feels his low back discomfort in the lower lumbar region centrally), straight leg raise negative with no pain on straight leg raise testing Skin- no new lesions of concern on exposed areas, denied otherwise Ext - no LE edema,  GU - no swelling in  inguinal/suprapubic region, NT  No testicle lumps, no masses palpated, no hernias, no lesions, no discharge Rectal: not done, prostate exam deferred (without concerning sx's and after discussion on current recommendations for prostate CA screening including PSA tests) Neuro - affect was not flat, appropriate with conversation  Grossly non-focal with good strength on testing, sensation intact to LT in distal extremities, including the hands where he has some occasional numbness,  DTR's 1+and = patella, Achilles, Romberg neg, no pronator drift, good finger to nose, gait normal in the office   PHQ2/9: Depression screen [facility] 2/9 10/29/2019 05/20/2019 01/28/2019 10/28/2018 05/09/2018  Decreased Interest 2 0 1 0 0  Down, Depressed, Hopeless 2 0 1 0 0  PHQ - 2 Score 4 0 2 0 0  Altered sleeping 3 - 0 0 -  Tired, decreased energy 3 - 1 0 -  Change in appetite 3 - 1 0 -  Feeling bad or failure about yourself  3 - 1 0 -  Trouble concentrating 0 - 0 0 -  Moving slowly or fidgety/restless 2 - 0 0 -  Suicidal thoughts 2 - 0 0 -  PHQ-9 Score 20 - 5 0 -  Difficult doing work/chores Somewhat difficult - Not difficult at all Not difficult at all -     Fall Risk: Fall Risk  10/29/2019 05/20/2019 01/28/2019 10/28/2018 05/09/2018  Falls in the past year? 0 0 0 0 0  Number falls in past yr: 0 0 0 0 0  Injury with Fall? 0 0 0 0 0  Risk for fall due to : - No Fall Risks - - -  Risk for fall due to: Comment - - - - -  Follow up - Falls prevention discussed - Falls evaluation completed Falls prevention discussed      Functional Status Survey: Is the patient deaf or have difficulty hearing?: No Does the patient have difficulty seeing, even when wearing glasses/contacts?: No Does the patient have difficulty concentrating, remembering, or making decisions?: Yes Does the patient have difficulty walking or climbing stairs?: No Does the patient have difficulty dressing or bathing?: No Does the patient have  difficulty doing errands alone such as visiting a doctor's office or shopping?: No   Assessment & Plan  1. General Health/Health Maintenance:   -Prostate cancer screening and PSA options (with potential risks and benefits of testing vs not testing) were discussed along with recent recs/guidelines.  Agreed to continue following PSAs  -USPSTF grade A and B recommendations reviewed with patient; age-appropriate recommendations, preventive  care, screening tests, etc discussed and encouraged; healthy living encouraged; see AVS for any further patient education given to patient  -Discussed importance of regular physical activity weekly, encouraged more walking  -Discussed importance of eating healthy diet:    -Reviewed Health Maintenance:  Adult vaccines due  Topic Date Due  . TETANUS/TDAP  01/10/2019    1. Annual physical exam - CBC with Differential/Platelet  2. Pure hypercholesterolemia Recheck lipid panel today Continue the statin presently - Lipid panel - COMPLETE METABOLIC PANEL WITH GFR  3. Atherosclerosis of aorta (HCC) Continue the statin - COMPLETE METABOLIC PANEL WITH GFR -LIPID PANEL 4. Prostate cancer screening We will check a PSA with today's labs - PSA  5. Adenomatous polyp of colon, unspecified part of colon Recent colonoscopy reviewed, and follow-up as noted above - CBC with Differential/Platelet  6. Centrilobular emphysema ([place]) 7. Tobacco abuse Annual CT scanning for lung cancer screening recommended to continue and he agreed to do so. Recommended complete tobacco cessation and noted the benefits of such. He requested having the albuterol inhaler on hand to use as needed, and felt reasonable, and refilled.  To follow-up if needing more frequently.  8. Encounter for screening for malignant neoplasm of respiratory organs Annual CT screening to continue  9. Encounter for abdominal aortic aneurysm (AAA) screening Discussed the screening for aneurysms, and  was ordered last year but not completed.  He agreed to have it done and reordered for this year.  10.  Current mild episode of major depression Discussed concerns with more depressive symptoms recently, although reassured he is having no thoughts of hurting self or hurting others presently.  Discussed medications and counseling as options to pursue, and noting the 2 together often work better than either alone. He would prefer to pursue counseling initially, and recommended he contact his insurance to help ensure that there is coverage for places he chooses in the area.  Noted a couple to him in the area including [facility] and [facility], although are others he cannot pursue and recommended he do so. I did note if counseling not helpful, can follow-up and can discuss medications to help in combination.  11.  Chronic midline low back pain, history of disc disease Discussed management of low back pain, and the importance of staying active.  He has no radicular signs of concern, no concerning bowel or bladder symptoms. Recommended warmth to the area, followed by range of motion exercises, followed by cold visit contrast therapy approach Can also use topical patches like an OTC lidocaine patch to help as needed. He asked about chiropractic care, and discussed concerns with any vigorous manipulations, although sometimes can be helpful in short-term management. Emphasized importance if having return of symptoms that are increasing, and follow-up as sometimes to refer to orthopedics to help with potential injections possibility and further management.   Await lab results presently, and tentatively scheduled a follow-up for 6 months time, as feel best to follow-up sooner than yearly and follow-up sooner as needed.     Shiela Mayer, MD

## 2019-10-29 ENCOUNTER — Encounter: Payer: Self-pay | Admitting: Internal Medicine

## 2019-10-29 ENCOUNTER — Ambulatory Visit (INDEPENDENT_AMBULATORY_CARE_PROVIDER_SITE_OTHER): Payer: Medicare PPO | Admitting: Internal Medicine

## 2019-10-29 ENCOUNTER — Other Ambulatory Visit: Payer: Self-pay

## 2019-10-29 VITALS — BP 120/60 | HR 90 | Temp 98.2°F | Resp 16 | Ht 70.0 in | Wt 118.4 lb

## 2019-10-29 DIAGNOSIS — I700 Atherosclerosis of aorta: Secondary | ICD-10-CM | POA: Diagnosis not present

## 2019-10-29 DIAGNOSIS — D126 Benign neoplasm of colon, unspecified: Secondary | ICD-10-CM | POA: Diagnosis not present

## 2019-10-29 DIAGNOSIS — M545 Low back pain, unspecified: Secondary | ICD-10-CM | POA: Insufficient documentation

## 2019-10-29 DIAGNOSIS — Z136 Encounter for screening for cardiovascular disorders: Secondary | ICD-10-CM

## 2019-10-29 DIAGNOSIS — J432 Centrilobular emphysema: Secondary | ICD-10-CM | POA: Diagnosis not present

## 2019-10-29 DIAGNOSIS — Z720 Tobacco use: Secondary | ICD-10-CM | POA: Diagnosis not present

## 2019-10-29 DIAGNOSIS — Z125 Encounter for screening for malignant neoplasm of prostate: Secondary | ICD-10-CM | POA: Diagnosis not present

## 2019-10-29 DIAGNOSIS — G8929 Other chronic pain: Secondary | ICD-10-CM

## 2019-10-29 DIAGNOSIS — Z0000 Encounter for general adult medical examination without abnormal findings: Secondary | ICD-10-CM | POA: Diagnosis not present

## 2019-10-29 DIAGNOSIS — Z122 Encounter for screening for malignant neoplasm of respiratory organs: Secondary | ICD-10-CM | POA: Diagnosis not present

## 2019-10-29 DIAGNOSIS — E7800 Pure hypercholesterolemia, unspecified: Secondary | ICD-10-CM | POA: Diagnosis not present

## 2019-10-29 DIAGNOSIS — F320 Major depressive disorder, single episode, mild: Secondary | ICD-10-CM

## 2019-10-29 MED ORDER — ALBUTEROL SULFATE HFA 108 (90 BASE) MCG/ACT IN AERS: 2.0000 | INHALATION_SPRAY | Freq: Four times a day (QID) | RESPIRATORY_TRACT | 2 refills | Status: DC | PRN

## 2019-10-29 NOTE — Patient Instructions (Signed)

## 2019-10-30 ENCOUNTER — Telehealth: Payer: Self-pay

## 2019-10-30 DIAGNOSIS — Z87891 Personal history of nicotine dependence: Secondary | ICD-10-CM

## 2019-10-30 DIAGNOSIS — Z122 Encounter for screening for malignant neoplasm of respiratory organs: Secondary | ICD-10-CM

## 2019-10-30 LAB — COMPLETE METABOLIC PANEL WITH GFR
AG Ratio: 1.8 (calc) (ref 1.0–2.5)
ALT: 23 U/L (ref 9–46)
AST: 31 U/L (ref 10–35)
Albumin: 4.7 g/dL (ref 3.6–5.1)
Alkaline phosphatase (APISO): 75 U/L (ref 35–144)
BUN: 10 mg/dL (ref 7–25)
CO2: 32 mmol/L (ref 20–32)
Calcium: 9.8 mg/dL (ref 8.6–10.3)
Chloride: 101 mmol/L (ref 98–110)
Creat: 0.79 mg/dL (ref 0.70–1.18)
GFR, Est African American: 103 mL/min/{1.73_m2} (ref >=60)
GFR, Est Non African American: 89 mL/min/{1.73_m2} (ref >=60)
Globulin: 2.6 g/dL (calc) (ref 1.9–3.7)
Glucose, Bld: 86 mg/dL (ref 65–99)
Potassium: 4.5 mmol/L (ref 3.5–5.3)
Sodium: 139 mmol/L (ref 135–146)
Total Bilirubin: 0.5 mg/dL (ref 0.2–1.2)
Total Protein: 7.3 g/dL (ref 6.1–8.1)

## 2019-10-30 LAB — PSA: PSA: 0.38 ng/mL (ref <=4.0)

## 2019-10-30 LAB — CBC WITH DIFFERENTIAL/PLATELET
Absolute Monocytes: 706 cells/uL (ref 200–950)
Basophils Absolute: 38 cells/uL (ref 0–200)
Basophils Relative: 0.8 %
Eosinophils Absolute: 101 cells/uL (ref 15–500)
Eosinophils Relative: 2.1 %
HCT: 46.4 % (ref 38.5–50.0)
Hemoglobin: 14.9 g/dL (ref 13.2–17.1)
Lymphs Abs: 1406 cells/uL (ref 850–3900)
MCH: 30.1 pg (ref 27.0–33.0)
MCHC: 32.1 g/dL (ref 32.0–36.0)
MCV: 93.7 fL (ref 80.0–100.0)
MPV: 9.9 fL (ref 7.5–12.5)
Monocytes Relative: 14.7 %
Neutro Abs: 2549 cells/uL (ref 1500–7800)
Neutrophils Relative %: 53.1 %
Platelets: 273 10*3/uL (ref 140–400)
RBC: 4.95 10*6/uL (ref 4.20–5.80)
RDW: 11.7 % (ref 11.0–15.0)
Total Lymphocyte: 29.3 %
WBC: 4.8 10*3/uL (ref 3.8–10.8)

## 2019-10-30 LAB — LIPID PANEL
Cholesterol: 140 mg/dL (ref <200)
HDL: 65 mg/dL (ref >=40)
LDL Cholesterol (Calc): 61 mg/dL (calc)
Non-HDL Cholesterol (Calc): 75 mg/dL (calc) (ref <130)
Total CHOL/HDL Ratio: 2.2 (calc) (ref <5.0)
Triglycerides: 59 mg/dL (ref <150)

## 2019-10-30 NOTE — Telephone Encounter (Signed)
Contacted patient to schedule next lung CT scan for lung screening clinic.  Message left for patient on cell phone to call Burgess Estelle, lung navigator to schedule scan.

## 2019-10-30 NOTE — Telephone Encounter (Signed)
Patient was informed that he has been scheduled to have his US of the Aorta on 11/06/2019 @ 10:15am at the [facility].

## 2019-10-31 NOTE — Addendum Note (Signed)
Addended by: Lieutenant Diego on: 10/31/2019 09:17 AM   Modules accepted: Orders

## 2019-10-31 NOTE — Telephone Encounter (Signed)
Contacted and scheduled. Current smoker, 56.25 pack year

## 2019-11-06 ENCOUNTER — Ambulatory Visit: Payer: Medicare Other

## 2019-11-06 ENCOUNTER — Telehealth: Payer: Self-pay | Admitting: Internal Medicine

## 2019-11-06 NOTE — Telephone Encounter (Signed)
The imaging center called about an issue with the approval for the Pts medicare screening appt tomorrow but then she stated that she might not need to change the order and she would check again and call first thing in the morning if anything needs to be changed with the order / Please check asap for more information

## 2019-11-07 ENCOUNTER — Other Ambulatory Visit: Payer: Self-pay

## 2019-11-07 ENCOUNTER — Ambulatory Visit
Admission: RE | Admit: 2019-11-07 | Discharge: 2019-11-07 | Disposition: A | Discharge status: Home / Self Care | Payer: Medicare PPO | Source: Ambulatory Visit | Attending: Internal Medicine | Admitting: Internal Medicine

## 2019-11-07 ENCOUNTER — Ambulatory Visit
Admission: RE | Admit: 2019-11-07 | Discharge: 2019-11-07 | Disposition: A | Discharge status: Home / Self Care | Payer: Medicare PPO | Source: Ambulatory Visit | Attending: Nurse Practitioner | Admitting: Nurse Practitioner

## 2019-11-07 DIAGNOSIS — F17210 Nicotine dependence, cigarettes, uncomplicated: Secondary | ICD-10-CM | POA: Diagnosis not present

## 2019-11-07 DIAGNOSIS — Z122 Encounter for screening for malignant neoplasm of respiratory organs: Secondary | ICD-10-CM

## 2019-11-07 DIAGNOSIS — Z87891 Personal history of nicotine dependence: Secondary | ICD-10-CM | POA: Insufficient documentation

## 2019-11-07 DIAGNOSIS — Z136 Encounter for screening for cardiovascular disorders: Secondary | ICD-10-CM | POA: Diagnosis not present

## 2019-11-10 ENCOUNTER — Encounter: Payer: Self-pay | Admitting: *Deleted

## 2019-11-30 ENCOUNTER — Other Ambulatory Visit: Payer: Self-pay

## 2019-11-30 ENCOUNTER — Observation Stay: Payer: Medicare PPO

## 2019-11-30 ENCOUNTER — Inpatient Hospital Stay
Admission: EM | Admit: 2019-11-30 | Discharge: 2019-12-03 | DRG: 280 | Disposition: A | Discharge status: Home / Self Care | Payer: Medicare PPO | Attending: Family Medicine | Admitting: Family Medicine

## 2019-11-30 ENCOUNTER — Emergency Department: Payer: Medicare PPO

## 2019-11-30 ENCOUNTER — Encounter: Payer: Self-pay | Admitting: Emergency Medicine

## 2019-11-30 DIAGNOSIS — Z833 Family history of diabetes mellitus: Secondary | ICD-10-CM | POA: Diagnosis not present

## 2019-11-30 DIAGNOSIS — F17210 Nicotine dependence, cigarettes, uncomplicated: Secondary | ICD-10-CM | POA: Diagnosis present

## 2019-11-30 DIAGNOSIS — I774 Celiac artery compression syndrome: Secondary | ICD-10-CM | POA: Diagnosis not present

## 2019-11-30 DIAGNOSIS — Z681 Body mass index (BMI) 19 or less, adult: Secondary | ICD-10-CM | POA: Diagnosis not present

## 2019-11-30 DIAGNOSIS — E785 Hyperlipidemia, unspecified: Secondary | ICD-10-CM | POA: Diagnosis present

## 2019-11-30 DIAGNOSIS — I700 Atherosclerosis of aorta: Secondary | ICD-10-CM | POA: Diagnosis present

## 2019-11-30 DIAGNOSIS — E119 Type 2 diabetes mellitus without complications: Secondary | ICD-10-CM | POA: Diagnosis present

## 2019-11-30 DIAGNOSIS — Z809 Family history of malignant neoplasm, unspecified: Secondary | ICD-10-CM | POA: Diagnosis not present

## 2019-11-30 DIAGNOSIS — I25110 Atherosclerotic heart disease of native coronary artery with unstable angina pectoris: Secondary | ICD-10-CM | POA: Diagnosis present

## 2019-11-30 DIAGNOSIS — Z8249 Family history of ischemic heart disease and other diseases of the circulatory system: Secondary | ICD-10-CM | POA: Diagnosis not present

## 2019-11-30 DIAGNOSIS — Z7982 Long term (current) use of aspirin: Secondary | ICD-10-CM

## 2019-11-30 DIAGNOSIS — I959 Hypotension, unspecified: Secondary | ICD-10-CM | POA: Diagnosis present

## 2019-11-30 DIAGNOSIS — Z720 Tobacco use: Secondary | ICD-10-CM | POA: Diagnosis not present

## 2019-11-30 DIAGNOSIS — Z20822 Contact with and (suspected) exposure to covid-19: Secondary | ICD-10-CM | POA: Diagnosis present

## 2019-11-30 DIAGNOSIS — E43 Unspecified severe protein-calorie malnutrition: Secondary | ICD-10-CM | POA: Diagnosis present

## 2019-11-30 DIAGNOSIS — Z811 Family history of alcohol abuse and dependence: Secondary | ICD-10-CM | POA: Diagnosis not present

## 2019-11-30 DIAGNOSIS — M549 Dorsalgia, unspecified: Secondary | ICD-10-CM | POA: Diagnosis not present

## 2019-11-30 DIAGNOSIS — I708 Atherosclerosis of other arteries: Secondary | ICD-10-CM | POA: Diagnosis not present

## 2019-11-30 DIAGNOSIS — R7989 Other specified abnormal findings of blood chemistry: Secondary | ICD-10-CM | POA: Diagnosis not present

## 2019-11-30 DIAGNOSIS — Z79899 Other long term (current) drug therapy: Secondary | ICD-10-CM

## 2019-11-30 DIAGNOSIS — I25119 Atherosclerotic heart disease of native coronary artery with unspecified angina pectoris: Secondary | ICD-10-CM | POA: Diagnosis present

## 2019-11-30 DIAGNOSIS — I2510 Atherosclerotic heart disease of native coronary artery without angina pectoris: Secondary | ICD-10-CM | POA: Diagnosis present

## 2019-11-30 DIAGNOSIS — I214 Non-ST elevation (NSTEMI) myocardial infarction: Secondary | ICD-10-CM | POA: Diagnosis present

## 2019-11-30 DIAGNOSIS — I472 Ventricular tachycardia, unspecified: Secondary | ICD-10-CM

## 2019-11-30 DIAGNOSIS — Z887 Allergy status to serum and vaccine status: Secondary | ICD-10-CM | POA: Diagnosis not present

## 2019-11-30 DIAGNOSIS — M8580 Other specified disorders of bone density and structure, unspecified site: Secondary | ICD-10-CM | POA: Diagnosis present

## 2019-11-30 DIAGNOSIS — J432 Centrilobular emphysema: Secondary | ICD-10-CM | POA: Diagnosis present

## 2019-11-30 DIAGNOSIS — Z8673 Personal history of transient ischemic attack (TIA), and cerebral infarction without residual deficits: Secondary | ICD-10-CM

## 2019-11-30 DIAGNOSIS — N400 Enlarged prostate without lower urinary tract symptoms: Secondary | ICD-10-CM | POA: Diagnosis not present

## 2019-11-30 DIAGNOSIS — I361 Nonrheumatic tricuspid (valve) insufficiency: Secondary | ICD-10-CM | POA: Diagnosis not present

## 2019-11-30 DIAGNOSIS — R079 Chest pain, unspecified: Secondary | ICD-10-CM | POA: Diagnosis present

## 2019-11-30 LAB — CBC
HCT: 43 % (ref 39.0–52.0)
Hemoglobin: 14 g/dL (ref 13.0–17.0)
MCH: 31 pg (ref 26.0–34.0)
MCHC: 32.6 g/dL (ref 30.0–36.0)
MCV: 95.3 fL (ref 80.0–100.0)
Platelets: 259 10*3/uL (ref 150–400)
RBC: 4.51 MIL/uL (ref 4.22–5.81)
RDW: 12.5 % (ref 11.5–15.5)
WBC: 5.8 10*3/uL (ref 4.0–10.5)
nRBC: 0 % (ref 0.0–0.2)

## 2019-11-30 LAB — TROPONIN I (HIGH SENSITIVITY)
Troponin I (High Sensitivity): 39 ng/L — ABNORMAL HIGH (ref <18)
Troponin I (High Sensitivity): 253 ng/L (ref <18)
Troponin I (High Sensitivity): 3589 ng/L (ref <18)
Troponin I (High Sensitivity): 7062 ng/L (ref <18)

## 2019-11-30 LAB — BASIC METABOLIC PANEL
Anion gap: 11 (ref 5–15)
BUN: 9 mg/dL (ref 8–23)
CO2: 27 mmol/L (ref 22–32)
Calcium: 9.1 mg/dL (ref 8.9–10.3)
Chloride: 98 mmol/L (ref 98–111)
Creatinine, Ser: 0.79 mg/dL (ref 0.61–1.24)
GFR, Estimated: >60 mL/min (ref >60)
Glucose, Bld: 122 mg/dL — ABNORMAL HIGH (ref 70–99)
Potassium: 4.1 mmol/L (ref 3.5–5.1)
Sodium: 136 mmol/L (ref 135–145)

## 2019-11-30 LAB — RESP PANEL BY RT-PCR (FLU A&B, COVID) ARPGX2
Influenza A by PCR: NEGATIVE
Influenza B by PCR: NEGATIVE
SARS Coronavirus 2 by RT PCR: NEGATIVE

## 2019-11-30 LAB — PROTIME-INR
INR: 1.2 (ref 0.8–1.2)
Prothrombin Time: 14.6 seconds (ref 11.4–15.2)

## 2019-11-30 LAB — TSH: TSH: 1.766 u[IU]/mL (ref 0.350–4.500)

## 2019-11-30 LAB — APTT: aPTT: 28 seconds (ref 24–36)

## 2019-11-30 MED ORDER — NITROGLYCERIN IN D5W 200-5 MCG/ML-% IV SOLN
0.0000 ug/min | INTRAVENOUS | Status: DC
  Administered 2019-11-30 (×2): 5 ug/min via INTRAVENOUS
  Administered 2019-12-01: 10 ug/min via INTRAVENOUS
  Filled 2019-11-30: qty 250

## 2019-11-30 MED ORDER — HEPARIN (PORCINE) 25000 UT/250ML-% IV SOLN
600.0000 [IU]/h | INTRAVENOUS | Status: DC
  Administered 2019-11-30: 600 [IU]/h via INTRAVENOUS
  Filled 2019-11-30: qty 250

## 2019-11-30 MED ORDER — ATORVASTATIN CALCIUM 20 MG PO TABS
20.0000 mg | ORAL_TABLET | Freq: Every day | ORAL | Status: DC
  Administered 2019-11-30: 20 mg via ORAL
  Filled 2019-11-30 (×2): qty 1

## 2019-11-30 MED ORDER — ASPIRIN 81 MG PO CHEW
324.0000 mg | CHEWABLE_TABLET | Freq: Once | ORAL | Status: AC
  Administered 2019-11-30: 324 mg via ORAL
  Filled 2019-11-30: qty 4

## 2019-11-30 MED ORDER — LACTATED RINGERS IV SOLN: INTRAVENOUS | Status: DC

## 2019-11-30 MED ORDER — IOHEXOL 350 MG/ML SOLN
100.0000 mL | Freq: Once | INTRAVENOUS | Status: AC | PRN
  Administered 2019-11-30: 100 mL via INTRAVENOUS

## 2019-11-30 MED ORDER — SODIUM CHLORIDE 0.9 % IV SOLN: Freq: Once | INTRAVENOUS | Status: AC

## 2019-11-30 MED ORDER — HEPARIN BOLUS VIA INFUSION
3000.0000 [IU] | Freq: Once | INTRAVENOUS | Status: AC
  Administered 2019-11-30: 3000 [IU] via INTRAVENOUS
  Filled 2019-11-30: qty 3000

## 2019-11-30 MED ORDER — MORPHINE SULFATE (PF) 4 MG/ML IV SOLN
4.0000 mg | Freq: Once | INTRAVENOUS | Status: AC
  Administered 2019-11-30: 4 mg via INTRAVENOUS
  Filled 2019-11-30: qty 1

## 2019-11-30 MED ORDER — ALBUTEROL SULFATE HFA 108 (90 BASE) MCG/ACT IN AERS
2.0000 | INHALATION_SPRAY | Freq: Four times a day (QID) | RESPIRATORY_TRACT | Status: DC | PRN
  Filled 2019-11-30: qty 6.7

## 2019-11-30 NOTE — ED Provider Notes (Signed)
[facility] Emergency Department Provider Note  ____________________________________________  Time seen: Approximately 3:57 PM  I have reviewed the triage vital signs and the nursing notes.   HISTORY  Chief Complaint Chest Pain    HPI Brent [name] is a 74 y.o. male who presents the emergency department complaining of centralized chest pain radiating into both arms.  Patient states that the pain began this morning.  He rates it a 10 out of 10.  He has no history of MI.  Does have a history of coronary artery disease.  Patient states that he is not experiencing any shortness of breath with his symptoms.  He has had no fevers or chills, no URI symptoms of nasal congestion or sore throat.  No cough.  No history of CHF.  Patient does not take any medication prior to arrival.  He states he is still having chest pain consistent without any relief from this morning.  Patient has a history of emphysema, CVA, DM Barr, hyperlipidemia, CAD.         Past Medical History:  Diagnosis Date  . Aortic calcification (HCC) 07/2015   Noted on chest CT with contrast  . Cataract   . Centrilobular emphysema ([place]) 08/03/2015   Noted on chest CT July 2017  . 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 Coronary artery calcification seen on CAT scan 07/29/2015   Noted on CT scan July 2017  . 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 Guillain Barr syndrome ([place])   . History of colonic polyps   . Hyperlipidemia   . Hypochloremia   . Osteopenia 08/29/2016   August 2018; next scan August 2020  . Right Supraclavicular Lymph node 08/03/2015  . Solitary pulmonary nodule 08/03/2015   CT Chest 07/2015 - Subpleural 3 mm left upper lobe pulmonary nodule, probably benign.  . Tobacco abuse     Patient Active Problem List   Diagnosis Date Noted  . NSTEMI (non-ST elevated myocardial infarction) ([place]) 11/30/2019  . Current mild episode of major depressive  disorder without prior episode ([place]) 10/29/2019  . Chronic midline low back pain without sciatica 10/29/2019  . Adenomatous polyp of colon 10/29/2019  . Polyp of ascending colon   . Atherosclerosis of aorta ([place]) 05/23/2017  . Osteopenia 08/29/2016  . Weakness 04/03/2016  . Underweight 03/27/2016  . Cigarette smoker one half pack a day or less 09/24/2015  . Incidental lung nodule, > 14mm and < 82mm 08/03/2015  . Centrilobular emphysema ([place]) 08/03/2015  . Right Supraclavicular Lymph node 08/03/2015  . Coronary artery disease 07/29/2015  . Carotid artery calcification 07/29/2015  . 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 Abnormal chest CT 07/29/2015  . Coronary artery calcification seen on CAT scan 07/29/2015  . Lymphadenopathy of right cervical region 07/19/2015  . Aortic calcification ([place]) 07/10/2015  . Benign neoplasm of transverse colon   . Benign neoplasm of descending colon   . Benign neoplasm of sigmoid colon   . Cataract   . Tobacco abuse   . History of colonic polyps   . Hypochloremia   . Low sodium levels 08/18/2013  . Back pain without radiation 08/18/2013  . Hyposmolality and/or hyponatremia 08/18/2013  . Guillain Barr syndrome ([place]) 08/11/2013    Past Surgical History:  Procedure Laterality Date  . COLONOSCOPY WITH PROPOFOL N/A 09/22/2014   Procedure: COLONOSCOPY WITH PROPOFOL;  Surgeon: Lucilla Lame, MD;  Location: [facility] ENDOSCOPY;  Service: Endoscopy;  Laterality: N/A;  . COLONOSCOPY WITH  PROPOFOL N/A 07/09/2018   Procedure: COLONOSCOPY WITH PROPOFOL;  Surgeon: Lucilla Lame, MD;  Location[facility] ENDOSCOPY;  Service: Endoscopy;  Laterality: N/A;  . DEEP NECK LYMPH NODE BIOPSY / EXCISION Right   . HERNIA REPAIR  1988  . LYMPH NODE BIOPSY    . NM MYOVIEW ([place] HX)  10/09/2013   Dr. Lujean Amel: EF 58%. Normal wall motion. No ischemia or infarction.  . shoulder    . SHOULDER ARTHROSCOPY Right   . TRANSTHORACIC ECHOCARDIOGRAM  09/24/2013   EF 45-50% with mild  global hypokinesis. GR 1 DD. No valvular lesions besides mild TR. Normal RV pressures.    Prior to Admission medications   Medication Sig Start Date End Date Taking? Authorizing Provider  acetaminophen (TYLENOL) 325 MG tablet Take 1 tablet by mouth every 6 (six) hours as needed.    [provider]  albuterol (VENTOLIN HFA) 108 (90 Base) MCG/ACT inhaler Inhale 2 puffs into the lungs every 6 (six) hours as needed for wheezing or shortness of breath. 10/29/19   Towanda Malkin, MD  aspirin EC 81 MG EC tablet Take 1 tablet (81 mg total) by mouth daily. 08/18/13   Love, Ivan Anchors, PA-C  atorvastatin (LIPITOR) 20 MG tablet Take 1 tablet (20 mg total) by mouth daily. 01/28/19   Hubbard Hartshorn, FNP  gabapentin (NEURONTIN) 100 MG capsule TAKE ONE CAPSULE BY MOUTH DAILY. ONLY AS NEEDED 04/21/19   Hubbard Hartshorn, FNP    Allergies Pneumococcal vaccine, Prevnar [pneumococcal 13-val conj vacc], and Influenza vaccines  Family History  Problem Relation Age of Onset  . Alcohol abuse Mother   . Healthy Daughter   . Parkinson's disease Other   . Stroke Other   . Diabetes Sister   . Heart disease Other   . Heart disease Other   . Alcohol abuse Brother   . Cancer Paternal Grandfather        pancreatic  . Heart disease Sister   . Heart disease Sister   . Healthy Son   . Healthy Daughter   . Bladder Cancer Neg Hx   . Kidney cancer Neg Hx     Social History Social History   Tobacco Use  . Smoking status: Current Some Day Smoker    Packs/day: 1.00    Years: 56.00    Pack years: 56.00    Types: Cigarettes  . Smokeless tobacco: Never Used  . Tobacco comment: smokes 4 cig weekly.  Vaping Use  . Vaping Use: Never used  Substance Use Topics  . Alcohol use: No  . Drug use: No     Review of Systems  Constitutional: No fever/chills Eyes: No visual changes. No discharge ENT: No upper respiratory complaints. Cardiovascular: Substernal chest pain radiating into both  arms Respiratory: no cough. No SOB. Gastrointestinal: No abdominal pain.  No nausea, no vomiting.  No diarrhea.  No constipation. Musculoskeletal: Negative for musculoskeletal pain. Skin: Negative for rash, abrasions, lacerations, ecchymosis. Neurological: Negative for headaches, focal weakness or numbness.  10 System ROS otherwise negative.  ____________________________________________   PHYSICAL EXAM:  VITAL SIGNS: ED Triage Vitals  Enc Vitals Group     BP 11/30/19 1249 (!) 142/91     Pulse Rate 11/30/19 1249 (!) 58     Resp 11/30/19 1249 16     Temp 11/30/19 1249 (!) 97.5 F (36.4 C)     Temp Source 11/30/19 1249 Oral     SpO2 11/30/19 1249 99 %     Weight 11/30/19  1247 115 lb (52.2 kg)     Height 11/30/19 1247 5\' 9"  (1.753 m)     Head Circumference --      Peak Flow --      Pain Score 11/30/19 1247 10     Pain Loc --      Pain Edu? --      Excl. in [place]? --      Constitutional: Alert and oriented. Well appearing and in no acute distress. Eyes: Conjunctivae are normal. PERRL. EOMI. Head: Atraumatic. ENT:      Ears:       Nose: No congestion/rhinnorhea.      Mouth/Throat: Mucous membranes are moist.  Neck: No stridor.    Cardiovascular: Normal rate, regular rhythm. Normal S1 and S2.  No murmurs, rubs, gallops.  Good peripheral circulation. Respiratory: Normal respiratory effort without tachypnea or retractions. Lungs CTAB. Good air entry to the bases with no decreased or absent breath sounds. Gastrointestinal: Bowel sounds 4 quadrants. Soft and nontender to palpation. No guarding or rigidity. No palpable masses. No distention. No CVA tenderness. Musculoskeletal: Full range of motion to all extremities. No gross deformities appreciated. Neurologic:  Normal speech and language. No gross focal neurologic deficits are appreciated.  Skin:  Skin is warm, dry and intact. No rash noted. Psychiatric: Mood and affect are normal. Speech and behavior are normal. Patient exhibits  appropriate insight and judgement.   ____________________________________________   LABS (all labs ordered are listed, but only abnormal results are displayed)  Labs Reviewed  BASIC METABOLIC PANEL - Abnormal; Notable for the following components:      Result Value   Glucose, Bld 122 (*)    All other components within normal limits  TROPONIN I (HIGH SENSITIVITY) - Abnormal; Notable for the following components:   Troponin I (High Sensitivity) 39 (*)    All other components within normal limits  TROPONIN I (HIGH SENSITIVITY) - Abnormal; Notable for the following components:   Troponin I (High Sensitivity) 253 (*)    All other components within normal limits  RESP PANEL BY RT-PCR (FLU A&B, COVID) ARPGX2  CBC   ____________________________________________  EKG  ED ECG REPORT I, Charline Bills Reyden Smith,  personally viewed and interpreted this ECG.   Date: 01/30/2019  EKG Time: 1237 hrs.  Rate: 66 bpm  Rhythm: unchanged from previous tracings, normal sinus rhythm, sinus arrhythmia  Axis: Normal axis  Intervals:none  ST&T Change: No ST elevation or depression noted  Normal sinus rhythm.  Sinus arrhythmia.  No STEMI.  ____________________________________________  RADIOLOGY I personally viewed and evaluated these images as part of my medical decision making, as well as reviewing the written report by the radiologist.  ED Provider Interpretation: No active cardiopulmonary disease.  DG Chest 2 View  Result Date: 11/30/2019 CLINICAL DATA:  Chest pain EXAM: CHEST - 2 VIEW COMPARISON:  03/02/2017, 11/07/2019 FINDINGS: The heart size and mediastinal contours are within normal limits. Atherosclerotic calcification of the aortic knob. Elevation and eventration of the left hemidiaphragm is unchanged from prior. Hyperinflated lungs. Both lungs are clear. The visualized skeletal structures are unremarkable. IMPRESSION: No active cardiopulmonary disease. Electronically Signed   By: Davina Poke D.O.   On: 11/30/2019 13:53    ____________________________________________    PROCEDURES  Procedure(s) performed:    Procedures    Medications  nitroGLYCERIN 50 mg in dextrose 5 % 250 mL (0.2 mg/mL) infusion (5 mcg/min Intravenous New Bag/Given 11/30/19 1632)  morphine 4 MG/ML injection 4 mg (has no administration in  time range)  aspirin chewable tablet 324 mg (324 mg Oral Given 11/30/19 1626)  morphine 4 MG/ML injection 4 mg (4 mg Intravenous Given 11/30/19 1624)  0.9 %  sodium chloride infusion ( Intravenous New Bag/Given 11/30/19 1622)     ____________________________________________   INITIAL IMPRESSION / ASSESSMENT AND PLAN / ED COURSE  Pertinent labs & imaging results that were available during my care of the patient were reviewed by me and considered in my medical decision making (see chart for details).  Review of the [place] CSRS was performed in accordance of the [place] prior to dispensing any controlled drugs.  Clinical Course as of Nov 29 1817  Nancy Fetter Nov 30, 2019  1612 Patient presented to emergency department with 10 out of 10 substernal chest pain radiating into both arms.  No cardiac history.  EKG revealed normal sinus rhythm with sinus arrhythmia but no evidence of STEMI.  Initial troponin was 39, repeat troponin rose to 253.  Patient is still symptomatic.  I talked to cardiology who advises to start with symptom control medications of aspirin, morphine, nitro drip.  Patient remains symptomatic, can we advise cardiology and at that time they will initiate Cath Lab team.  However at this time cardiology again recommends medication trial first.   [JC]  1701 On reassessment of the patient, patient chest pain has dropped from 10 out of 10 to 5 out of 10.  He reports good improvement with medication.  Patient's vital remained stable.  He remains in normal sinus rhythm on the monitor.   [JC]    Clinical Course User Index [JC] Krishav Mamone, Charline Bills, PA-C           Patient's diagnosis is consistent with NSTEMI.  Patient presented to the emergency department complaining of centralized chest pain with radiation to both arms.  Patient had a reassuring EKG on arrival but did have an elevated troponin at 39, this increased to 253.  On the patient's being roomed, patient continued to complain of substernal chest pain.  Aspirin, morphine, nitro drip was started.  Patient had improvement of his symptoms.  Cardiology did not recommend any emergent interventions.  At this time, patient remained stable, did have improvement on symptom control medications and will be admitted to the hospital service.  Differential included ACS/STEMI, bronchitis, pneumonia, GERD.Marland Kitchen  Patient remains in normal sinus rhythm here in the emergency department.  Patient tolerated nitro drip well without hypotension.  Patient care will be transferred to the hospital service at this time.   ____________________________________________  FINAL CLINICAL IMPRESSION(S) / ED DIAGNOSES  Final diagnoses:  NSTEMI (non-ST elevated myocardial infarction) ([place])      NEW MEDICATIONS STARTED DURING THIS VISIT:  ED Discharge Orders    None          This chart was dictated using voice recognition software/Dragon. Despite best efforts to proofread, errors can occur which can change the meaning. Any change was purely unintentional.    Darletta Moll, PA-C 11/30/19 1819    Harvest Dark, MD 11/30/19 2128

## 2019-11-30 NOTE — ED Notes (Signed)
Nitroglycerin drip paused at this moment for soft BP.  Current setting at 5mcg

## 2019-11-30 NOTE — ED Notes (Signed)
Date and time results received: 11/30/19 1553 (use smartphrase ".now" to insert current time)  Test: Troponin Critical Value: 253  Name of Provider Notified: Betha Loa, PA  Orders Received? Or Actions Taken?: Cardiology consult and admission

## 2019-11-30 NOTE — ED Triage Notes (Signed)
Pt to ED via POV stating that he has had chest pain x about 45 minutes. Pt also states that he has numbness in both arm. Pt states that he was sitting in [place] when pain started. Pt states that pain is constant.

## 2019-11-30 NOTE — Consult Note (Signed)
ANTICOAGULATION CONSULT NOTE  Pharmacy Consult for Heparin Infusion Indication: chest pain/ACS  Patient Measurements: Heparin Dosing Weight: 52.2 kg  Labs: Recent Labs    11/30/19 1254 11/30/19 1518 11/30/19 1939  HGB 14.0  --   --   HCT 43.0  --   --   PLT 259  --   --   CREATININE 0.79  --   --   TROPONINIHS 39* 253* 3,589*    Estimated Creatinine Clearance: 60.7 mL/min (by C-G formula based on SCr of 0.79 mg/dL).   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) July 2015   follow Prevnar vaccine   Guillain Barr syndrome [facility]    History of colonic polyps    Hyperlipidemia    Hypochloremia    Osteopenia 08/29/2016   August 2018; next scan August 2020   Right Supraclavicular Lymph node 08/03/2015   Solitary pulmonary nodule 08/03/2015   CT Chest 07/2015 - Subpleural 3 mm left upper lobe pulmonary nodule, probably benign.   Tobacco abuse     Medications:  No anticoagulation prior to admission per my chart review   Assessment: Patient is a 74 y/o M with medical history as above who presented to the ED 11/21 with chest pain. Troponin 39 >> 253 >> 3589. Pharmacy has been consulted to initiate heparin infusion for suspected ACS.  Baseline CBC within normal limits. Baseline aPTT and PT-INR pending.   Goal of Therapy:  Heparin level 0.3-0.7 units/ml Monitor platelets by anticoagulation protocol: Yes   Plan:  --Heparin 3000 unit IV bolus x 1 followed by continuous infusion at 600 units/hr --Heparin level 8 hours after initiation of infusion --Daily CBC per protocol  Benita Gutter 11/30/2019,9:22 PM

## 2019-11-30 NOTE — ED Notes (Signed)
Advised nurse that patient has assigned bed 

## 2019-11-30 NOTE — H&P (Signed)
History and Physical   Brent [name] GYK:[id number] DOB: 10/01/1945 DOA: 11/30/2019  PCP: Towanda Malkin, MD  Outpatient Specialists: Dr. Alva Garnet with pulmonology Patient coming from: Home  I have personally briefly reviewed patient's old medical records in [place].  Chief Concern: Chest pain  HPI: Brent [name] is a 74 y.o. male with medical history significant for tobacco use, hyperlipidemia, coronary artery disease on CT imaging presented to the emergency department for chief concerns of chest pain started at rest.  He reports the chest pain was sharp and pressured pressured while he was sitting at [facility]. He endorsed shortness of breath with inhalation, and numbness to bilateral upper extremities. He endorsed abnormal taste in his mouth, and jaw pain.  He states that at its peak the pain was a 10 out of 10.  And after morphine 2 mg it was a 7 out of 10.  He reports a similar episode approximately 2 weeks ago.  He reports the pain was also pressured however lasted only minutes and went away.  Social history: lives with spouse. Retired from [employer]. Currrently smokes 3-4 cigarettes per day (formerly 1 pack per day years ago), denies etoh and recreational drug use.   ED Course: Discussed with ED provider, requesting admission for NSTEMI.  Review of Systems: As per HPI otherwise 10 point review of systems negative.  Assessment/Plan  Principal Problem:   NSTEMI (non-ST elevated myocardial infarction) [facility]) Active Problems:   Coronary artery disease   Aortic calcification (HCC)   Coronary artery calcification seen on CAT scan   Cigarette smoker one half pack a day or less   NSTEMI-suspect secondary to coronary artery disease -Cardiology has been consulted and discussion with Dr. Oval Linsey and Dr. Fletcher Anon in progress via secure chat -Uptrending HS troponins: 39 to 253 to 3589 to 7062 -Heparin pharmacy consult for ACS  placed -Initial EKG was negative for NSTEMI -Repeat EKG -Status post aspirin 324 milligrams p.o., morphine 4 mg IV, nitroglycerin gtt. per cardiology -N.p.o. -Atorvastatin 20 mg nightly -LR 125 cc/h for 10 hours in anticipation of cath  On re-evaluation, chest pain secondary to dissection has not been ruled out -Discussed with nursing staff for peripheral IV minimum of 20-gauge needed -CT of the chest abdomen and pelvis to assess for dissection that ordered -Morphine 4 g IV once  Tobacco abuse-counseling given for tobacco cessation  Chart reviewed.   DVT prophylaxis: Pending cardiology Code Status: Full code Diet: N.p.o. in anticipation of cath Family Communication: Updated spouse at bedside Disposition Plan: Pending clinical course Consults called: Cardiology Admission status: observation with telemetry to progressive cardiac   Past Medical History:  Diagnosis Date  . Aortic calcification (HCC) 07/2015   Noted on chest CT with contrast  . Cataract   . Centrilobular emphysema ([place]) 08/03/2015   Noted on chest CT July 2017  . 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[place]) 07/29/2015  . Coronary artery calcification seen on CAT scan 07/29/2015   Noted on CT scan July 2017  . 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 Guillain Barr syndrome ([place])   . History of colonic polyps   . Hyperlipidemia   . Hypochloremia   . Osteopenia 08/29/2016   August 2018; next scan August 2020  . Right Supraclavicular Lymph node 08/03/2015  . Solitary pulmonary nodule 08/03/2015   CT Chest 07/2015 - Subpleural 3 mm left upper lobe pulmonary nodule, probably benign.  . Tobacco abuse  Past Surgical History:  Procedure Laterality Date  . COLONOSCOPY WITH PROPOFOL N/A 09/22/2014   Procedure: COLONOSCOPY WITH PROPOFOL;  Surgeon: Lucilla Lame, MD;  Location: [facility] ENDOSCOPY;  Service: Endoscopy;  Laterality: N/A;  . COLONOSCOPY WITH PROPOFOL N/A 07/09/2018   Procedure: COLONOSCOPY WITH  PROPOFOL;  Surgeon: Lucilla Lame, MD;  Location: [facility] ENDOSCOPY;  Service: Endoscopy;  Laterality: N/A;  . DEEP NECK LYMPH NODE BIOPSY / EXCISION Right   . HERNIA REPAIR  1988  . LYMPH NODE BIOPSY    . NM MYOVIEW ([place] HX)  10/09/2013   Dr. Lujean Amel: EF 58%. Normal wall motion. No ischemia or infarction.  . shoulder    . SHOULDER ARTHROSCOPY Right   . TRANSTHORACIC ECHOCARDIOGRAM  09/24/2013   EF 45-50% with mild global hypokinesis. GR 1 DD. No valvular lesions besides mild TR. Normal RV pressures.   Social History:  reports that he has been smoking cigarettes. He has a 56.00 pack-year smoking history. He has never used smokeless tobacco. He reports that he does not drink alcohol and does not use drugs.  Allergies  Allergen Reactions  . Influenza Vaccines     GUILLAIN BARRE SYNDROME   . Pneumococcal Vaccine Other (See Comments)    GUILLAIN BARRE SYNDROME NUMBNESS, SOB,CHEST PAIN   . Prevnar [Pneumococcal 13-Val Conj Vacc] Other (See Comments)    GUILLAIN-BARRE SYNDROME   Family History  Problem Relation Age of Onset  . Alcohol abuse Mother   . Healthy Daughter   . Parkinson's disease Other   . Stroke Other   . Diabetes Sister   . Heart disease Other   . Heart disease Other   . Alcohol abuse Brother   . Cancer Paternal Grandfather        pancreatic  . Heart disease Sister   . Heart disease Sister   . Healthy Son   . Healthy Daughter   . Bladder Cancer Neg Hx   . Kidney cancer Neg Hx    Family history: Family history reviewed and positive for congenital heart problems in sister.   Prior to Admission medications   Medication Sig Start Date End Date Taking? Authorizing Provider  acetaminophen (TYLENOL) 325 MG tablet Take 1 tablet by mouth every 6 (six) hours as needed.    [provider]  albuterol (VENTOLIN HFA) 108 (90 Base) MCG/ACT inhaler Inhale 2 puffs into the lungs every 6 (six) hours as needed for wheezing or shortness of breath. 10/29/19    Towanda Malkin, MD  aspirin EC 81 MG EC tablet Take 1 tablet (81 mg total) by mouth daily. 08/18/13   Love, Ivan Anchors, PA-C  atorvastatin (LIPITOR) 20 MG tablet Take 1 tablet (20 mg total) by mouth daily. 01/28/19   Hubbard Hartshorn, FNP  gabapentin (NEURONTIN) 100 MG capsule TAKE ONE CAPSULE BY MOUTH DAILY. ONLY AS NEEDED 04/21/19   Hubbard Hartshorn, FNP   Physical Exam: Vitals:   11/30/19 1845 11/30/19 1900 11/30/19 1915 11/30/19 1930  BP: (!) 98/59 (!) 88/50 (!) 97/59 113/64  Pulse: 61 67 (!) 53 61  Resp: 13 16 14 19   Temp:      TempSrc:      SpO2: 98% 100% 100% 100%  Weight:      Height:       Constitutional: appears age-appropriate, NAD, calm, comfortable Eyes: PERRL, lids and conjunctivae normal ENMT: Mucous membranes are moist. Posterior pharynx clear of any exudate or lesions. Age-appropriate dentition. Hearing appropriate Neck: normal, supple, no masses, no thyromegaly  Respiratory: clear to auscultation bilaterally, no wheezing, no crackles. Normal respiratory effort. No accessory muscle use.  Cardiovascular: Regular rate and rhythm, no murmurs / rubs / gallops. No extremity edema. 2+ pedal pulses. No carotid bruits.  Abdomen: no tenderness, no masses palpated, no hepatosplenomegaly. Bowel sounds positive.  Musculoskeletal: no clubbing / cyanosis. No joint deformity upper and lower extremities. Good ROM, no contractures, no atrophy. Normal muscle tone.  Skin: no rashes, lesions, ulcers. No induration Neurologic: Sensation intact. Strength 5/5 in all 4.  Psychiatric: Normal judgment and insight. Alert and oriented x 3. Normal mood.   EKG: Independently reviewed, showing sinus rhythm with rate of 66, QTC 389  Chest x-ray on Admission: Personally reviewed and I agree with radiologist reading as below.  DG Chest 2 View  Result Date: 11/30/2019 CLINICAL DATA:  Chest pain EXAM: CHEST - 2 VIEW COMPARISON:  03/02/2017, 11/07/2019 FINDINGS: The heart size and mediastinal contours  are within normal limits. Atherosclerotic calcification of the aortic knob. Elevation and eventration of the left hemidiaphragm is unchanged from prior. Hyperinflated lungs. Both lungs are clear. The visualized skeletal structures are unremarkable. IMPRESSION: No active cardiopulmonary disease. Electronically Signed   By: Davina Poke D.O.   On: 11/30/2019 13:53   Labs on Admission: I have personally reviewed following labs  CBC: Recent Labs  Lab 11/30/19 1254  WBC 5.8  HGB 14.0  HCT 43.0  MCV 95.3  PLT 827   Basic Metabolic Panel: Recent Labs  Lab 11/30/19 1254  NA 136  K 4.1  CL 98  CO2 27  GLUCOSE 122*  BUN 9  CREATININE 0.79  CALCIUM 9.1   Urine analysis:    Component Value Date/Time   APPEARANCEUR Clear 11/17/2016 1358   GLUCOSEU Negative 11/17/2016 1358   BILIRUBINUR Negative 11/17/2016 1358   PROTEINUR Negative 11/17/2016 1358   NITRITE Negative 11/17/2016 1358   LEUKOCYTESUR Negative 11/17/2016 1358   CRITICAL CARE Performed by: Briant Cedar Baraka Klatt  Total critical care time: 45 minutes  Critical care time was exclusive of separately billable procedures and treating other patients.  Critical care was necessary to treat or prevent imminent or life-threatening deterioration of the following conditions: acute cardiac complications  Critical care was time spent personally by me on the following activities: development of treatment plan with patient and/or surrogate as well as nursing, discussions with consultants, evaluation of patient's response to treatment, examination of patient, obtaining history from patient or surrogate, ordering and performing treatments and interventions, ordering and review of laboratory studies, ordering and review of radiographic studies, pulse oximetry and re-evaluation of patient's condition.  Laasia Arcos N Airabella Barley D.O. [facility]  If 12AM-7AM, please contact overnight-coverage provider If 7AM-7PM, please contact day coverage  provider [email or web address]  11/30/2019, 7:44 PM

## 2019-12-01 ENCOUNTER — Encounter: Payer: Self-pay | Admitting: Cardiovascular Disease

## 2019-12-01 ENCOUNTER — Encounter
Admission: EM | Disposition: A | Discharge status: Home / Self Care | Payer: Self-pay | Source: Home / Self Care | Attending: Internal Medicine

## 2019-12-01 ENCOUNTER — Other Ambulatory Visit: Payer: Self-pay

## 2019-12-01 ENCOUNTER — Inpatient Hospital Stay (HOSPITAL_COMMUNITY)
Admit: 2019-12-01 | Discharge: 2019-12-01 | Disposition: A | Discharge status: Home / Self Care | Payer: Medicare PPO | Attending: Cardiovascular Disease | Admitting: Cardiovascular Disease

## 2019-12-01 DIAGNOSIS — F17210 Nicotine dependence, cigarettes, uncomplicated: Secondary | ICD-10-CM | POA: Diagnosis present

## 2019-12-01 DIAGNOSIS — I2510 Atherosclerotic heart disease of native coronary artery without angina pectoris: Secondary | ICD-10-CM

## 2019-12-01 DIAGNOSIS — Z8673 Personal history of transient ischemic attack (TIA), and cerebral infarction without residual deficits: Secondary | ICD-10-CM | POA: Diagnosis not present

## 2019-12-01 DIAGNOSIS — Z720 Tobacco use: Secondary | ICD-10-CM

## 2019-12-01 DIAGNOSIS — Z8249 Family history of ischemic heart disease and other diseases of the circulatory system: Secondary | ICD-10-CM | POA: Diagnosis not present

## 2019-12-01 DIAGNOSIS — E43 Unspecified severe protein-calorie malnutrition: Secondary | ICD-10-CM | POA: Insufficient documentation

## 2019-12-01 DIAGNOSIS — E119 Type 2 diabetes mellitus without complications: Secondary | ICD-10-CM | POA: Diagnosis present

## 2019-12-01 DIAGNOSIS — I472 Ventricular tachycardia: Secondary | ICD-10-CM | POA: Diagnosis not present

## 2019-12-01 DIAGNOSIS — I361 Nonrheumatic tricuspid (valve) insufficiency: Secondary | ICD-10-CM

## 2019-12-01 DIAGNOSIS — E785 Hyperlipidemia, unspecified: Secondary | ICD-10-CM

## 2019-12-01 DIAGNOSIS — I700 Atherosclerosis of aorta: Secondary | ICD-10-CM | POA: Diagnosis present

## 2019-12-01 DIAGNOSIS — Z809 Family history of malignant neoplasm, unspecified: Secondary | ICD-10-CM | POA: Diagnosis not present

## 2019-12-01 DIAGNOSIS — I25110 Atherosclerotic heart disease of native coronary artery with unstable angina pectoris: Secondary | ICD-10-CM | POA: Diagnosis present

## 2019-12-01 DIAGNOSIS — M8580 Other specified disorders of bone density and structure, unspecified site: Secondary | ICD-10-CM | POA: Diagnosis present

## 2019-12-01 DIAGNOSIS — Z681 Body mass index (BMI) 19 or less, adult: Secondary | ICD-10-CM | POA: Diagnosis not present

## 2019-12-01 DIAGNOSIS — Z833 Family history of diabetes mellitus: Secondary | ICD-10-CM | POA: Diagnosis not present

## 2019-12-01 DIAGNOSIS — Z79899 Other long term (current) drug therapy: Secondary | ICD-10-CM | POA: Diagnosis not present

## 2019-12-01 DIAGNOSIS — I214 Non-ST elevation (NSTEMI) myocardial infarction: Secondary | ICD-10-CM | POA: Diagnosis present

## 2019-12-01 DIAGNOSIS — I959 Hypotension, unspecified: Secondary | ICD-10-CM | POA: Diagnosis present

## 2019-12-01 DIAGNOSIS — J432 Centrilobular emphysema: Secondary | ICD-10-CM | POA: Diagnosis present

## 2019-12-01 DIAGNOSIS — Z887 Allergy status to serum and vaccine status: Secondary | ICD-10-CM | POA: Diagnosis not present

## 2019-12-01 DIAGNOSIS — Z811 Family history of alcohol abuse and dependence: Secondary | ICD-10-CM | POA: Diagnosis not present

## 2019-12-01 DIAGNOSIS — Z20822 Contact with and (suspected) exposure to covid-19: Secondary | ICD-10-CM | POA: Diagnosis present

## 2019-12-01 DIAGNOSIS — R079 Chest pain, unspecified: Secondary | ICD-10-CM | POA: Diagnosis present

## 2019-12-01 DIAGNOSIS — Z7982 Long term (current) use of aspirin: Secondary | ICD-10-CM | POA: Diagnosis not present

## 2019-12-01 HISTORY — PX: LEFT HEART CATH AND CORONARY ANGIOGRAPHY: CATH118249

## 2019-12-01 LAB — BASIC METABOLIC PANEL
Anion gap: 11 (ref 5–15)
BUN: 9 mg/dL (ref 8–23)
CO2: 26 mmol/L (ref 22–32)
Calcium: 8.1 mg/dL — ABNORMAL LOW (ref 8.9–10.3)
Chloride: 102 mmol/L (ref 98–111)
Creatinine, Ser: 0.68 mg/dL (ref 0.61–1.24)
GFR, Estimated: >60 mL/min (ref >60)
Glucose, Bld: 102 mg/dL — ABNORMAL HIGH (ref 70–99)
Potassium: 4.2 mmol/L (ref 3.5–5.1)
Sodium: 139 mmol/L (ref 135–145)

## 2019-12-01 LAB — LIPID PANEL
Cholesterol: 111 mg/dL (ref 0–200)
HDL: 49 mg/dL (ref >40)
LDL Cholesterol: 57 mg/dL (ref 0–99)
Total CHOL/HDL Ratio: 2.3 RATIO
Triglycerides: 26 mg/dL (ref <150)
VLDL: 5 mg/dL (ref 0–40)

## 2019-12-01 LAB — URINE DRUG SCREEN, QUALITATIVE (ARMC ONLY)
Amphetamines, Ur Screen: NOT DETECTED
Barbiturates, Ur Screen: NOT DETECTED
Benzodiazepine, Ur Scrn: POSITIVE — AB
Cannabinoid 50 Ng, Ur ~~LOC~~: NOT DETECTED
Cocaine Metabolite,Ur ~~LOC~~: NOT DETECTED
MDMA (Ecstasy)Ur Screen: NOT DETECTED
Methadone Scn, Ur: NOT DETECTED
Opiate, Ur Screen: POSITIVE — AB
Phencyclidine (PCP) Ur S: NOT DETECTED
Tricyclic, Ur Screen: NOT DETECTED

## 2019-12-01 LAB — CBC
HCT: 36.9 % — ABNORMAL LOW (ref 39.0–52.0)
Hemoglobin: 12.1 g/dL — ABNORMAL LOW (ref 13.0–17.0)
MCH: 31.3 pg (ref 26.0–34.0)
MCHC: 32.8 g/dL (ref 30.0–36.0)
MCV: 95.6 fL (ref 80.0–100.0)
Platelets: 204 10*3/uL (ref 150–400)
RBC: 3.86 MIL/uL — ABNORMAL LOW (ref 4.22–5.81)
RDW: 12.7 % (ref 11.5–15.5)
WBC: 8.4 10*3/uL (ref 4.0–10.5)
nRBC: 0 % (ref 0.0–0.2)

## 2019-12-01 LAB — TROPONIN I (HIGH SENSITIVITY)
Troponin I (High Sensitivity): 16230 ng/L (ref <18)
Troponin I (High Sensitivity): 21619 ng/L (ref <18)

## 2019-12-01 LAB — HEPARIN LEVEL (UNFRACTIONATED): Heparin Unfractionated: 0.41 IU/mL (ref 0.30–0.70)

## 2019-12-01 SURGERY — LEFT HEART CATH AND CORONARY ANGIOGRAPHY
Anesthesia: Moderate Sedation

## 2019-12-01 MED ORDER — SODIUM CHLORIDE 0.9% FLUSH: 3.0000 mL | INTRAVENOUS | Status: DC | PRN

## 2019-12-01 MED ORDER — SODIUM CHLORIDE 0.9% FLUSH: 3.0000 mL | Freq: Two times a day (BID) | INTRAVENOUS | Status: DC

## 2019-12-01 MED ORDER — ASPIRIN 81 MG PO CHEW
81.0000 mg | CHEWABLE_TABLET | Freq: Every day | ORAL | Status: DC
  Administered 2019-12-01 – 2019-12-03 (×3): 81 mg via ORAL
  Filled 2019-12-01 (×3): qty 1

## 2019-12-01 MED ORDER — SODIUM CHLORIDE 0.9 % IV SOLN: 250.0000 mL | INTRAVENOUS | Status: DC | PRN

## 2019-12-01 MED ORDER — HEPARIN (PORCINE) 25000 UT/250ML-% IV SOLN
750.0000 [IU]/h | INTRAVENOUS | Status: DC
  Administered 2019-12-01: 600 [IU]/h via INTRAVENOUS
  Administered 2019-12-03: 750 [IU]/h via INTRAVENOUS
  Filled 2019-12-01 (×2): qty 250

## 2019-12-01 MED ORDER — ONDANSETRON HCL 4 MG/2ML IJ SOLN
INTRAMUSCULAR | Status: AC
  Filled 2019-12-01: qty 2

## 2019-12-01 MED ORDER — VERAPAMIL HCL 2.5 MG/ML IV SOLN
INTRAVENOUS | Status: DC | PRN
  Administered 2019-12-01: 2.5 mg via INTRA_ARTERIAL

## 2019-12-01 MED ORDER — ASPIRIN 81 MG PO CHEW
CHEWABLE_TABLET | ORAL | Status: AC
  Filled 2019-12-01: qty 1

## 2019-12-01 MED ORDER — FENTANYL CITRATE (PF) 100 MCG/2ML IJ SOLN
INTRAMUSCULAR | Status: DC | PRN
  Administered 2019-12-01: 25 ug via INTRAVENOUS

## 2019-12-01 MED ORDER — MIDAZOLAM HCL 2 MG/2ML IJ SOLN
INTRAMUSCULAR | Status: AC
  Filled 2019-12-01: qty 2

## 2019-12-01 MED ORDER — CLOPIDOGREL BISULFATE 75 MG PO TABS
75.0000 mg | ORAL_TABLET | Freq: Every day | ORAL | Status: DC
  Administered 2019-12-02 – 2019-12-03 (×2): 75 mg via ORAL
  Filled 2019-12-01 (×2): qty 1

## 2019-12-01 MED ORDER — ADULT MULTIVITAMIN W/MINERALS CH
1.0000 | ORAL_TABLET | Freq: Every day | ORAL | Status: DC
  Administered 2019-12-02 – 2019-12-03 (×2): 1 via ORAL
  Filled 2019-12-01 (×2): qty 1

## 2019-12-01 MED ORDER — VERAPAMIL HCL 2.5 MG/ML IV SOLN
INTRAVENOUS | Status: AC
  Filled 2019-12-01: qty 2

## 2019-12-01 MED ORDER — FENTANYL CITRATE (PF) 100 MCG/2ML IJ SOLN
INTRAMUSCULAR | Status: AC
  Filled 2019-12-01: qty 2

## 2019-12-01 MED ORDER — ATORVASTATIN CALCIUM 80 MG PO TABS
80.0000 mg | ORAL_TABLET | Freq: Every day | ORAL | Status: DC
  Administered 2019-12-01 – 2019-12-03 (×3): 80 mg via ORAL
  Filled 2019-12-01 (×3): qty 1

## 2019-12-01 MED ORDER — ACETAMINOPHEN 325 MG PO TABS: 650.0000 mg | ORAL_TABLET | ORAL | Status: DC | PRN

## 2019-12-01 MED ORDER — LIDOCAINE HCL (PF) 1 % IJ SOLN
INTRAMUSCULAR | Status: DC | PRN
  Administered 2019-12-01: 2 mL

## 2019-12-01 MED ORDER — ENSURE ENLIVE PO LIQD
237.0000 mL | Freq: Two times a day (BID) | ORAL | Status: DC
  Administered 2019-12-02 – 2019-12-03 (×2): 237 mL via ORAL

## 2019-12-01 MED ORDER — HEPARIN SODIUM (PORCINE) 1000 UNIT/ML IJ SOLN
INTRAMUSCULAR | Status: DC | PRN
  Administered 2019-12-01: 3000 [IU] via INTRAVENOUS

## 2019-12-01 MED ORDER — ONDANSETRON HCL 4 MG/2ML IJ SOLN: 4.0000 mg | Freq: Four times a day (QID) | INTRAMUSCULAR | Status: DC | PRN

## 2019-12-01 MED ORDER — HEPARIN (PORCINE) IN NACL 1000-0.9 UT/500ML-% IV SOLN
INTRAVENOUS | Status: DC | PRN
  Administered 2019-12-01: 500 mL

## 2019-12-01 MED ORDER — MIDAZOLAM HCL 2 MG/2ML IJ SOLN
INTRAMUSCULAR | Status: DC | PRN
  Administered 2019-12-01: 1 mg via INTRAVENOUS

## 2019-12-01 MED ORDER — HEPARIN SODIUM (PORCINE) 1000 UNIT/ML IJ SOLN
INTRAMUSCULAR | Status: AC
  Filled 2019-12-01: qty 1

## 2019-12-01 MED ORDER — LIDOCAINE HCL (PF) 1 % IJ SOLN
INTRAMUSCULAR | Status: AC
  Filled 2019-12-01: qty 30

## 2019-12-01 MED ORDER — HEPARIN (PORCINE) IN NACL 1000-0.9 UT/500ML-% IV SOLN
INTRAVENOUS | Status: AC
  Filled 2019-12-01: qty 1000

## 2019-12-01 MED ORDER — SODIUM CHLORIDE 0.9 % IV SOLN: INTRAVENOUS | Status: AC

## 2019-12-01 MED ORDER — SODIUM CHLORIDE 0.9 % IV SOLN: INTRAVENOUS | Status: DC

## 2019-12-01 MED ORDER — SODIUM CHLORIDE 0.9% FLUSH
3.0000 mL | Freq: Two times a day (BID) | INTRAVENOUS | Status: DC
  Administered 2019-12-03: 3 mL via INTRAVENOUS

## 2019-12-01 MED ORDER — IOHEXOL 300 MG/ML  SOLN
INTRAMUSCULAR | Status: DC | PRN
  Administered 2019-12-01: 70 mL

## 2019-12-01 MED ORDER — ONDANSETRON HCL 4 MG/2ML IJ SOLN
4.0000 mg | Freq: Once | INTRAMUSCULAR | Status: AC
  Administered 2019-12-01: 4 mg via INTRAVENOUS

## 2019-12-01 MED ORDER — ASPIRIN 81 MG PO CHEW
81.0000 mg | CHEWABLE_TABLET | ORAL | Status: AC
  Administered 2019-12-01: 81 mg via ORAL

## 2019-12-01 SURGICAL SUPPLY — 7 items
CATH INFINITI 5FR JK (CATHETERS) ×3 IMPLANT
DEVICE RAD TR BAND REGULAR (VASCULAR PRODUCTS) ×3 IMPLANT
GLIDESHEATH SLEND SS 6F .021 (SHEATH) ×3 IMPLANT
GUIDEWIRE INQWIRE 1.5J.035X260 (WIRE) ×1 IMPLANT
INQWIRE 1.5J .035X260CM (WIRE) ×3
KIT MANI 3VAL PERCEP (MISCELLANEOUS) ×3 IMPLANT
PACK CARDIAC CATH (CUSTOM PROCEDURE TRAY) ×3 IMPLANT

## 2019-12-01 NOTE — Progress Notes (Signed)
Dr Oval Linsey returned call and will see patient this am. No new orders received.

## 2019-12-01 NOTE — Progress Notes (Signed)
*  PRELIMINARY RESULTS* Echocardiogram 2D Echocardiogram has been performed.  Brent [name] Brent [name] 12/01/2019, 7:50 PM

## 2019-12-01 NOTE — Consult Note (Signed)
ANTICOAGULATION CONSULT NOTE  Pharmacy Consult for Heparin Infusion Indication: chest pain/ACS  Patient Measurements: Heparin Dosing Weight: 52.2 kg  Labs: Recent Labs    11/30/19 1254 11/30/19 1518 11/30/19 1939 11/30/19 2139 12/01/19 0405  HGB 14.0  --   --   --  12.1*  HCT 43.0  --   --   --  36.9*  PLT 259  --   --   --  204  APTT  --   --   --  28  --   LABPROT  --   --   --  14.6  --   INR  --   --   --  1.2  --   HEPARINUNFRC  --   --   --   --  0.41  CREATININE 0.79  --   --   --  0.68  TROPONINIHS 39*   < > 3,589* 7,062* 16,230*   < > = values in this interval not displayed.    Estimated Creatinine Clearance: 61 mL/min (by C-G formula based on SCr of 0.68 mg/dL).   Medical History: Past Medical History:  Diagnosis Date  . Aortic calcification (HCC) 07/2015   Noted on chest CT with contrast  . Cataract   . Centrilobular emphysema ([place]) 08/03/2015   Noted on chest CT July 2017  . 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[place]) 07/29/2015  . Coronary artery calcification seen on CAT scan 07/29/2015   Noted on CT scan July 2017  . 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 Guillain Barr syndrome ([place])   . History of colonic polyps   . Hyperlipidemia   . Hypochloremia   . Osteopenia 08/29/2016   August 2018; next scan August 2020  . Right Supraclavicular Lymph node 08/03/2015  . Solitary pulmonary nodule 08/03/2015   CT Chest 07/2015 - Subpleural 3 mm left upper lobe pulmonary nodule, probably benign.  . Tobacco abuse     Medications:  No anticoagulation prior to admission per my chart review   Assessment: Patient is a 74 y/o M with medical history as above who presented to the ED 11/21 with chest pain. Troponin 39 >> 253 >> 3589. Pharmacy has been consulted to initiate heparin infusion for suspected ACS.  Baseline CBC within normal limits. Baseline aPTT and PT-INR pending.   Goal of Therapy:  Heparin level 0.3-0.7 units/ml Monitor platelets by  anticoagulation protocol: Yes   Plan:  11/22:  HL @ 6644 = 0.41 Will continue pt on current rate and draw confirmation level in 8 hrs on 11/22 @ 1200.  Anaise Sterbenz D 12/01/2019,6:21 AM

## 2019-12-01 NOTE — Consult Note (Signed)
Cardiology Consultation:   Patient ID: Brent [name] MRN: [id number]; DOB: 01-06-1946  Admit date: 11/30/2019 Date of Consult: 12/01/2019  Primary Care Provider: Towanda Malkin, MD [facility] Cardiologist:new Brent [name]) [place] Electrophysiologist:  None    Patient Profile:   Brent [name] is a 74 y.o. male with a hx of coronary atherosclerosis noted on previous CT scan, tobacco use and hyperlipidemia who is being seen today for the evaluation of non-ST elevation myocardial infarction at the request of Dr. Tobie [name].  History of Present Illness:   Brent [name] is a 74 year old African-American male.  He has history of Guillain-Barr syndrome in addition to multiple other comorbidities listed above.  He had Guillain-Barr syndrome after pneumonia vaccine in the past.  He was previously seen by Dr. Clayborn [name] in 2018 for shortness of breath.  Nuclear stress testing was normal.  No cardiology follow-up since then. He presented with substernal chest pain described as burning and tightness feeling radiating to his jaw and neck.  This started yesterday morning and has been continuous.  Given persistent symptoms, he came to the emergency room.  EKG did not show significant ST or T wave changes.  Initial troponin was minimally elevated but gradually increased to greater than 20,000.  His chest pain partially improved with heparin drip and nitroglycerin drip but he still has mild discomfort at this point.  CTA of the chest showed no evidence of aortic dissection.  Past Medical History:  Diagnosis Date  . Aortic calcification (HCC) 07/2015   Noted on chest CT with contrast  . Cataract   . Centrilobular emphysema ([place]) 08/03/2015   Noted on chest CT July 2017  . 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[place]) 07/29/2015  . Coronary artery calcification seen on CAT scan 07/29/2015   Noted on CT scan July 2017  . 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 Guillain Barr  syndrome ([place])   . History of colonic polyps   . Hyperlipidemia   . Hypochloremia   . Osteopenia 08/29/2016   August 2018; next scan August 2020  . Right Supraclavicular Lymph node 08/03/2015  . Solitary pulmonary nodule 08/03/2015   CT Chest 07/2015 - Subpleural 3 mm left upper lobe pulmonary nodule, probably benign.  . Tobacco abuse     Past Surgical History:  Procedure Laterality Date  . COLONOSCOPY WITH PROPOFOL N/A 09/22/2014   Procedure: COLONOSCOPY WITH PROPOFOL;  Surgeon: Brent Lame, MD;  Location: [facility] ENDOSCOPY;  Service: Endoscopy;  Laterality: N/A;  . COLONOSCOPY WITH PROPOFOL N/A 07/09/2018   Procedure: COLONOSCOPY WITH PROPOFOL;  Surgeon: Brent Lame, MD;  Location: [facility] ENDOSCOPY;  Service: Endoscopy;  Laterality: N/A;  . DEEP NECK LYMPH NODE BIOPSY / EXCISION Right   . HERNIA REPAIR  1988  . LYMPH NODE BIOPSY    . NM MYOVIEW ([name] HX)  10/09/2013   Dr. Lujean [name]: EF 58%. Normal wall motion. No ischemia or infarction.  . shoulder    . SHOULDER ARTHROSCOPY Right   . TRANSTHORACIC ECHOCARDIOGRAM  09/24/2013   EF 45-50% with mild global hypokinesis. GR 1 DD. No valvular lesions besides mild TR. Normal RV pressures.     Home Medications:  Prior to Admission medications   Medication Sig Start Date End Date Taking? Authorizing Provider  aspirin EC 81 MG EC tablet Take 1 tablet (81 mg total) by mouth daily. 08/18/13  Yes [name], Brent Anchors, PA-C  atorvastatin (LIPITOR) 20 MG tablet Take 1 tablet (20 mg total) by mouth  daily. 01/28/19  Yes Brent Hartshorn, FNP  gabapentin (NEURONTIN) 100 MG capsule TAKE ONE CAPSULE BY MOUTH DAILY. ONLY AS NEEDED Patient taking differently: Take 100 mg by mouth daily.  04/21/19  Yes Brent Hartshorn, FNP  acetaminophen (TYLENOL) 325 MG tablet Take 650 mg by mouth every 6 (six) hours as needed.     [provider]  albuterol (VENTOLIN HFA) 108 (90 Base) MCG/ACT inhaler Inhale 2 puffs into the lungs every 6 (six) hours as needed for wheezing  or shortness of breath. 10/29/19   Brent Malkin, MD    Inpatient Medications: Scheduled Meds: . [START ON 12/02/2019] aspirin  81 mg Oral Pre-Cath  . atorvastatin  20 mg Oral QHS  . sodium chloride flush  3 mL Intravenous Q12H   Continuous Infusions: . sodium chloride    . sodium chloride    . heparin 600 Units/hr (11/30/19 2332)  . lactated ringers 125 mL/hr at 12/01/19 0750  . nitroGLYCERIN 10 mcg/min (12/01/19 0830)   PRN Meds: sodium chloride, albuterol, sodium chloride flush  Allergies:    Allergies  Allergen Reactions  . Influenza Vaccines     GUILLAIN BARRE SYNDROME   . Pneumococcal Vaccine Other (See Comments)    GUILLAIN BARRE SYNDROME NUMBNESS, SOB,CHEST PAIN   . Prevnar [Pneumococcal 13-Val Conj Vacc] Other (See Comments)    GUILLAIN-BARRE SYNDROME    Social History:   Social History   Socioeconomic History  . Marital status: Married    Spouse name: Brent [name]  . Number of children: 3  . Years of education: some college  . Highest education level: 12th grade  Occupational History  . Occupation: Retired  Tobacco Use  . Smoking status: Current Some Day Smoker    Packs/day: 1.00    Years: 56.00    Pack years: 56.00    Types: Cigarettes  . Smokeless tobacco: Never Used  . Tobacco comment: smokes 4 cig weekly.  Vaping Use  . Vaping Use: Never used  Substance and Sexual Activity  . Alcohol use: No  . Drug use: No  . Sexual activity: Not Currently    Partners: Female  Other Topics Concern  . Not on file  Social History Narrative   Current tobacco user, 2-3 cigarettes per day for the past 4-5 months, previously 2 packs per day for 52 years, formerly worked as a Administrator, has dogs at home.   Social Determinants of Health   Financial Resource Strain: High Risk  . Difficulty of Paying Living Expenses: Hard  Food Insecurity: Food Insecurity Present  . Worried About Charity fundraiser in the Last Year: Often true  . Ran Out of Food in  the Last Year: Never true  Transportation Needs: No Transportation Needs  . Lack of Transportation (Medical): No  . Lack of Transportation (Non-Medical): No  Physical Activity: Insufficiently Active  . Days of Exercise per Week: 3 days  . Minutes of Exercise per Session: 30 min  Stress: Stress Concern Present  . Feeling of Stress : Rather much  Social Connections: Socially Integrated  . Frequency of Communication with Friends and Family: Three times a week  . Frequency of Social Gatherings with Friends and Family: Never  . Attends Religious Services: More than 4 times per year  . Active Member of Clubs or Organizations: Yes  . Attends Archivist Meetings: More than 4 times per year  . Marital Status: Married  Human resources officer Violence: Not At Risk  . Fear of  Current or Ex-Partner: No  . Emotionally Abused: No  . Physically Abused: No  . Sexually Abused: No    Family History:    Family History  Problem Relation Age of Onset  . Alcohol abuse Mother   . Healthy Daughter   . Parkinson's disease Other   . Stroke Other   . Diabetes Sister   . Heart disease Other   . Heart disease Other   . Alcohol abuse Brother   . Cancer Paternal Grandfather        pancreatic  . Heart disease Sister   . Heart disease Sister   . Healthy Son   . Healthy Daughter   . Bladder Cancer Neg Hx   . Kidney cancer Neg Hx      ROS:  Please see the history of present illness.   All other ROS reviewed and negative.     Physical Exam/Data:   Vitals:   12/01/19 0500 12/01/19 0600 12/01/19 0700 12/01/19 0800  BP: (!) 113/58 (!) 114/55 (!) 110/57 (!) 112/58  Pulse: 64 83 63 65  Resp:      Temp:      TempSrc:      SpO2:      Weight:      Height:        Intake/Output Summary (Last 24 hours) at 12/01/2019 0833 Last data filed at 12/01/2019 0333 Gross per 24 hour  Intake 609.36 ml  Output --  Net 609.36 ml   Last 3 Weights 12/01/2019 11/30/2019 11/30/2019  Weight (lbs) 115 lb 8.3  oz 115 lb 115 lb  Weight (kg) 52.4 kg 52.164 kg 52.164 kg     Body mass index is 17.06 kg/m.  General:  Well nourished, well developed, in no acute distress HEENT: normal Lymph: no adenopathy Neck: no JVD Endocrine:  No thryomegaly Vascular: No carotid bruits; FA pulses 2+ bilaterally without bruits  Cardiac:  normal S1, S2; RRR; no murmur  Lungs:  clear to auscultation bilaterally, no wheezing, rhonchi or rales  Abd: soft, nontender, no hepatomegaly  Ext: no edema Musculoskeletal:  No deformities, BUE and BLE strength normal and equal Skin: warm and dry  Neuro:  CNs 2-12 intact, no focal abnormalities noted Psych:  Normal affect   EKG:  The EKG was personally reviewed and demonstrates: Normal sinus rhythm with no significant ST changes. Telemetry:  Telemetry was personally reviewed and demonstrates: Normal sinus rhythm  Relevant CV Studies:   Laboratory Data:  High Sensitivity Troponin:   Recent Labs  Lab 11/30/19 1518 11/30/19 1939 11/30/19 2139 12/01/19 0405 12/01/19 0518  TROPONINIHS 253* 3,589* 7,062* 16,230* 21,619*     Chemistry Recent Labs  Lab 11/30/19 1254 12/01/19 0405  NA 136 139  K 4.1 4.2  CL 98 102  CO2 27 26  GLUCOSE 122* 102*  BUN 9 9  CREATININE 0.79 0.68  CALCIUM 9.1 8.1*  GFRNONAA >60 >60  ANIONGAP 11 11    No results for input(s): PROT, ALBUMIN, AST, ALT, ALKPHOS, BILITOT in the last 168 hours. Hematology Recent Labs  Lab 11/30/19 1254 12/01/19 0405  WBC 5.8 8.4  RBC 4.51 3.86*  HGB 14.0 12.1*  HCT 43.0 36.9*  MCV 95.3 95.6  MCH 31.0 31.3  MCHC 32.6 32.8  RDW 12.5 12.7  PLT 259 204   BNPNo results for input(s): BNP, PROBNP in the last 168 hours.  DDimer No results for input(s): DDIMER in the last 168 hours.   Radiology/Studies:  DG Chest 2 View  Result Date: 11/30/2019 CLINICAL DATA:  Chest pain EXAM: CHEST - 2 VIEW COMPARISON:  03/02/2017, 11/07/2019 FINDINGS: The heart size and mediastinal contours are within normal  limits. Atherosclerotic calcification of the aortic knob. Elevation and eventration of the left hemidiaphragm is unchanged from prior. Hyperinflated lungs. Both lungs are clear. The visualized skeletal structures are unremarkable. IMPRESSION: No active cardiopulmonary disease. Electronically Signed   By: Davina Poke D.O.   On: 11/30/2019 13:53   CT Angio Chest/Abd/Pel for Dissection W and/or W/WO  Result Date: 11/30/2019 CLINICAL DATA:  Chest pain.  Back pain.  Concern for dissection. EXAM: CT ANGIOGRAPHY CHEST, ABDOMEN AND PELVIS TECHNIQUE: Non-contrast CT of the chest was initially obtained. Multidetector CT imaging through the chest, abdomen and pelvis was performed using the standard protocol during bolus administration of intravenous contrast. Multiplanar reconstructed images and MIPs were obtained and reviewed to evaluate the vascular anatomy. CONTRAST:  171mL OMNIPAQUE IOHEXOL 350 MG/ML SOLN COMPARISON:  CT chest dated 11/07/2019 FINDINGS: CTA CHEST FINDINGS Cardiovascular: There is no evidence for thoracic aortic dissection or aneurysm. There are atherosclerotic changes of the thoracic aorta and coronary arteries. There is no large centrally located pulmonary embolism. The heart size is unremarkable. Mediastinum/Nodes: -- No mediastinal lymphadenopathy. --mild right hilar adenopathy is noted. -- No axillary lymphadenopathy. -- No supraclavicular lymphadenopathy. -- Normal thyroid gland where visualized. -  Unremarkable esophagus. Lungs/Pleura: There is persistent scarring at the lung apices bilaterally, similar to prior study. There is similar pleuroparenchymal scarring along the peripheral right upper lobe. There is no focal infiltrate. There is no large pleural effusion. Musculoskeletal: No chest wall abnormality. No bony spinal canal stenosis. Review of the MIP images confirms the above findings. CTA ABDOMEN AND PELVIS FINDINGS VASCULAR Aorta: There are atherosclerotic changes of the abdominal  aorta. There is no evidence for abdominal aortic aneurysm. Celiac: There is moderate narrowing at the origin of the celiac axis. SMA: Patent without evidence of aneurysm, dissection, vasculitis or significant stenosis. Renals: Both renal arteries are patent without evidence of aneurysm, dissection, vasculitis, fibromuscular dysplasia or significant stenosis. 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 Inflow: There are atherosclerotic changes of both common iliac arteries resulting in at least mild bilateral narrowing. There is moderate stenosis at the origin of the right external iliac artery. Veins: No obvious venous abnormality within the limitations of this arterial phase study. Review of the MIP images confirms the above findings. NON-VASCULAR Hepatobiliary: The liver is normal. Cholelithiasis without acute inflammation.There is no biliary ductal dilation. Pancreas: Normal contours without ductal dilatation. No peripancreatic fluid collection. Spleen: Unremarkable. Adrenals/Urinary Tract: --Adrenal glands: Unremarkable. --Right kidney/ureter: No hydronephrosis or radiopaque kidney stones. --Left kidney/ureter: No hydronephrosis or radiopaque kidney stones. --Urinary bladder: The urinary bladder is distended. Stomach/Bowel: --Stomach/Duodenum: No hiatal hernia or other gastric abnormality. Normal duodenal course and caliber. --Small bowel: Unremarkable. --Colon: Unremarkable. --Appendix: Normal. Lymphatic: --No retroperitoneal lymphadenopathy. --No mesenteric lymphadenopathy. --No pelvic or inguinal lymphadenopathy. Reproductive: The prostate gland is enlarged. Other: No ascites or free air. There is a fat containing left inguinal hernia which also contains a small portion of the colon without evidence for obstruction. Musculoskeletal. No acute displaced fractures. Review of the MIP images confirms the above findings. IMPRESSION: 1. No evidence of thoracic or abdominal aortic dissection or aneurysm. 2. No acute findings in the  chest, abdomen or pelvis. 3. Cholelithiasis without acute inflammation. 4. Enlarged prostate gland. Aortic Atherosclerosis (ICD10-I70.0). Electronically Signed   By: Constance Holster M.D.   On: 11/30/2019 23:16  Assessment and Plan:   1. Non-ST elevation myocardial infarction: Troponin is significantly elevated but EKG does not meet criteria for ST elevation myocardial infarction.  CTA was negative for aortic dissection.  Blood pressure is low on nitroglycerin drip and thus will decrease the dose.  I agree with heparin drip, aspirin and a statin.  I recommend proceeding with urgent left heart catheterization and possible PCI.  I discussed the procedure in details as well as risk and benefits.  He is agreeable to proceed.  Further recommendations to follow after cardiac cath. 2. Tobacco use: Discussed the importance of smoking cessation 3. Hyperlipidemia: We will increase atorvastatin to 80 mg daily given myocardial infarction.   TIMI Risk Score for Unstable Angina or Non-ST Elevation MI:   The patient's TIMI risk score is 5, which indicates a 26% risk of all cause mortality, new or recurrent myocardial infarction or need for urgent revascularization in the next 14 days.           For questions or updates, please contact [place] Please consult [email or web address] for contact info under    Signed, [name], MD  12/01/2019 8:33 AM

## 2019-12-01 NOTE — Progress Notes (Signed)
Patient ID: Brent [name], male   DOB: Oct 28, 1945, 74 y.o.   MRN: [id number] [facility] PROGRESS NOTE  Brent [name] Jani [name] DOB: January 17, 1945 DOA: 11/30/2019 PCP: Brent Malkin, MD  HPI/Subjective: Patient seen this morning was having chest pain and was on nitro drip.  Blood pressure was on the lower side.  Patient stated he also had sweating nausea vomiting and shortness of breath.  Admitted for NSTEMI.  Objective: Vitals:   12/01/19 1154 12/01/19 1554  BP: 123/74 (!) 126/55  Pulse: 70 76  Resp: 17 20  Temp: 98.6 F (37 C) 99.8 F (37.7 C)  SpO2: 96% 96%    Intake/Output Summary (Last 24 hours) at 12/01/2019 1610 Last data filed at 12/01/2019 1608 Gross per 24 hour  Intake 609.36 ml  Output 300 ml  Net 309.36 ml   Filed Weights   11/30/19 1247 11/30/19 2013 12/01/19 0300  Weight: 52.2 kg 52.2 kg 52.4 kg    ROS: Review of Systems  Respiratory: Positive for shortness of breath.   Cardiovascular: Positive for chest pain.  Gastrointestinal: Negative for abdominal pain, nausea and vomiting.   Exam: Physical Exam HENT:     Head: Normocephalic.     Mouth/Throat:     Pharynx: No oropharyngeal exudate.  Eyes:     General: Lids are normal.     Conjunctiva/sclera: Conjunctivae normal.     Pupils: Pupils are equal, round, and reactive to light.  Cardiovascular:     Rate and Rhythm: Normal rate and regular rhythm.     Heart sounds: Normal heart sounds, S1 normal and S2 normal.  Pulmonary:     Breath sounds: No decreased breath sounds, wheezing, rhonchi or rales.  Abdominal:     Palpations: Abdomen is soft.     Tenderness: There is no abdominal tenderness.  Musculoskeletal:     Right ankle: No swelling.     Left ankle: No swelling.  Skin:    General: Skin is warm.     Findings: No rash.  Neurological:     Mental Status: He is alert and oriented to person, place, and time.       Data Reviewed: Basic Metabolic Panel: Recent Labs   Lab 11/30/19 1254 12/01/19 0405  NA 136 139  K 4.1 4.2  CL 98 102  CO2 27 26  GLUCOSE 122* 102*  BUN 9 9  CREATININE 0.79 0.68  CALCIUM 9.1 8.1*   CBC: Recent Labs  Lab 11/30/19 1254 12/01/19 0405  WBC 5.8 8.4  HGB 14.0 12.1*  HCT 43.0 36.9*  MCV 95.3 95.6  PLT 259 204     Recent Results (from the past 240 hour(s))  Resp Panel by RT-PCR (Flu A&B, Covid) Nasopharyngeal Swab     Status: None   Collection Time: 11/30/19  4:13 PM   Specimen: Nasopharyngeal Swab; Nasopharyngeal(NP) swabs in vial transport medium  Result Value Ref Range Status   SARS Coronavirus 2 by RT PCR NEGATIVE NEGATIVE Final    Comment: (NOTE) SARS-CoV-2 target nucleic acids are NOT DETECTED.  The SARS-CoV-2 RNA is generally detectable in upper respiratory specimens during the acute phase of infection. The lowest concentration of SARS-CoV-2 viral copies this assay can detect is 138 copies/mL. A negative result does not preclude SARS-Cov-2 infection and should not be used as the sole basis for treatment or other patient management decisions. A negative result may occur with  improper specimen collection/handling, submission of specimen other than nasopharyngeal swab, presence of viral mutation(s) within the  areas targeted by this assay, and inadequate number of viral copies(<138 copies/mL). A negative result must be combined with clinical observations, patient history, and epidemiological information. The expected result is Negative.  Fact Sheet for Patients:  EntrepreneurPulse.com.au  Fact Sheet for Healthcare Providers:  IncredibleEmployment.be  This test is no t yet approved or cleared by the [place] FDA and  has been authorized for detection and/or diagnosis of SARS-CoV-2 by FDA under an Emergency Use Authorization (EUA). This EUA will remain  in effect (meaning this test can be used) for the duration of the COVID-19 declaration under Section  564(b)(1) of the Act, 21 U.S.C.section 360bbb-3(b)(1), unless the authorization is terminated  or revoked sooner.       Influenza A by PCR NEGATIVE NEGATIVE Final   Influenza B by PCR NEGATIVE NEGATIVE Final    Comment: (NOTE) The Xpert Xpress SARS-CoV-2/FLU/RSV plus assay is intended as an aid in the diagnosis of influenza from Nasopharyngeal swab specimens and should not be used as a sole basis for treatment. Nasal washings and aspirates are unacceptable for Xpert Xpress SARS-CoV-2/FLU/RSV testing.  Fact Sheet for Patients: EntrepreneurPulse.com.au  Fact Sheet for Healthcare Providers: IncredibleEmployment.be  This test is not yet approved or cleared by the [place] FDA and has been authorized for detection and/or diagnosis of SARS-CoV-2 by FDA under an Emergency Use Authorization (EUA). This EUA will remain in effect (meaning this test can be used) for the duration of the COVID-19 declaration under Section 564(b)(1) of the Act, 21 U.S.C. section 360bbb-3(b)(1), unless the authorization is terminated or revoked.  Performed at [facility], [address] [place], [place] [address]      Studies: DG Chest 2 View  Result Date: 11/30/2019 CLINICAL DATA:  Chest pain EXAM: CHEST - 2 VIEW COMPARISON:  03/02/2017, 11/07/2019 FINDINGS: The heart size and mediastinal contours are within normal limits. Atherosclerotic calcification of the aortic knob. Elevation and eventration of the left hemidiaphragm is unchanged from prior. Hyperinflated lungs. Both lungs are clear. The visualized skeletal structures are unremarkable. IMPRESSION: No active cardiopulmonary disease. Electronically Signed   By: Davina Poke D.O.   On: 11/30/2019 13:53   CARDIAC CATHETERIZATION  Result Date: 12/01/2019  The left ventricular systolic function is normal.  LV end diastolic pressure is moderately elevated.  The left ventricular ejection fraction  is 55-65% by visual estimate.  Prox RCA lesion is 90% stenosed.  Prox LAD to Mid LAD lesion is 40% stenosed.  Mid LAD lesion is 20% stenosed.  1st Diag lesion is 70% stenosed.  Ramus lesion is 60% stenosed.  Mid Cx to Dist Cx lesion is 70% stenosed.  Mid LM to Dist LM lesion is 30% stenosed.  1. Borderline significant one-vessel coronary artery disease affecting the mid left circumflex. The coronary arteries are heavily calcified especially in the proximal segment with no clear culprit for non-ST elevation myocardial infarction. 2. Normal LV systolic function mildly moderately elevated left ventricular end-diastolic pressure at 22 mmHg. Recommendations: I do not see a clear culprit for non-ST elevation myocardial infarction. Exact etiology for this degree of troponin elevation is still not entirely clear but could be due to an occluded vessel with subsequent recanalization. I am going to add Plavix in addition to aspirin to be used for at least a year. I increase atorvastatin to 80 mg. We will obtain an echocardiogram to evaluate RV function and make sure there is no other etiology for troponin elevation.   CT Angio Chest/Abd/Pel for Dissection W and/or  W/WO  Result Date: 11/30/2019 CLINICAL DATA:  Chest pain.  Back pain.  Concern for dissection. EXAM: CT ANGIOGRAPHY CHEST, ABDOMEN AND PELVIS TECHNIQUE: Non-contrast CT of the chest was initially obtained. Multidetector CT imaging through the chest, abdomen and pelvis was performed using the standard protocol during bolus administration of intravenous contrast. Multiplanar reconstructed images and MIPs were obtained and reviewed to evaluate the vascular anatomy. CONTRAST:  150mL OMNIPAQUE IOHEXOL 350 MG/ML SOLN COMPARISON:  CT chest dated 11/07/2019 FINDINGS: CTA CHEST FINDINGS Cardiovascular: There is no evidence for thoracic aortic dissection or aneurysm. There are atherosclerotic changes of the thoracic aorta and coronary arteries. There is no large  centrally located pulmonary embolism. The heart size is unremarkable. Mediastinum/Nodes: -- No mediastinal lymphadenopathy. --mild right hilar adenopathy is noted. -- No axillary lymphadenopathy. -- No supraclavicular lymphadenopathy. -- Normal thyroid gland where visualized. -  Unremarkable esophagus. Lungs/Pleura: There is persistent scarring at the lung apices bilaterally, similar to prior study. There is similar pleuroparenchymal scarring along the peripheral right upper lobe. There is no focal infiltrate. There is no large pleural effusion. Musculoskeletal: No chest wall abnormality. No bony spinal canal stenosis. Review of the MIP images confirms the above findings. CTA ABDOMEN AND PELVIS FINDINGS VASCULAR Aorta: There are atherosclerotic changes of the abdominal aorta. There is no evidence for abdominal aortic aneurysm. Celiac: There is moderate narrowing at the origin of the celiac axis. SMA: Patent without evidence of aneurysm, dissection, vasculitis or significant stenosis. Renals: Both renal arteries are patent without evidence of aneurysm, dissection, vasculitis, fibromuscular dysplasia or significant stenosis. 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 Inflow: There are atherosclerotic changes of both common iliac arteries resulting in at least mild bilateral narrowing. There is moderate stenosis at the origin of the right external iliac artery. Veins: No obvious venous abnormality within the limitations of this arterial phase study. Review of the MIP images confirms the above findings. NON-VASCULAR Hepatobiliary: The liver is normal. Cholelithiasis without acute inflammation.There is no biliary ductal dilation. Pancreas: Normal contours without ductal dilatation. No peripancreatic fluid collection. Spleen: Unremarkable. Adrenals/Urinary Tract: --Adrenal glands: Unremarkable. --Right kidney/ureter: No hydronephrosis or radiopaque kidney stones. --Left kidney/ureter: No hydronephrosis or radiopaque kidney stones.  --Urinary bladder: The urinary bladder is distended. Stomach/Bowel: --Stomach/Duodenum: No hiatal hernia or other gastric abnormality. Normal duodenal course and caliber. --Small bowel: Unremarkable. --Colon: Unremarkable. --Appendix: Normal. Lymphatic: --No retroperitoneal lymphadenopathy. --No mesenteric lymphadenopathy. --No pelvic or inguinal lymphadenopathy. Reproductive: The prostate gland is enlarged. Other: No ascites or free air. There is a fat containing left inguinal hernia which also contains a small portion of the colon without evidence for obstruction. Musculoskeletal. No acute displaced fractures. Review of the MIP images confirms the above findings. IMPRESSION: 1. No evidence of thoracic or abdominal aortic dissection or aneurysm. 2. No acute findings in the chest, abdomen or pelvis. 3. Cholelithiasis without acute inflammation. 4. Enlarged prostate gland. Aortic Atherosclerosis (ICD10-I70.0). Electronically Signed   By: Constance Holster M.D.   On: 11/30/2019 23:16    Scheduled Meds: . aspirin      . aspirin  81 mg Oral Daily  . atorvastatin  80 mg Oral Daily  . [START ON 12/02/2019] clopidogrel  75 mg Oral Q breakfast  . feeding supplement  237 mL Oral BID BM  . [START ON 12/02/2019] multivitamin with minerals  1 tablet Oral Daily  . ondansetron      . sodium chloride flush  3 mL Intravenous Q12H   Continuous Infusions: . sodium chloride    .  sodium chloride    . heparin      Assessment/Plan:  1. NSTEMI.  Troponin went up above 21,000.  Patient was initially placed on heparin drip given aspirin and nitroglycerin drip.  Patient went for cardiac catheterization and was determined medical management.  Patient now on aspirin, Plavix, atorvastatin.  Will be placed on heparin drip this evening.  Reassess tomorrow for symptoms. 2. Hypotension initially with nitroglycerin drip. 3. Tobacco abuse only smokes 3 to 4 cigarettes a day.  Can go cold [place]. 4. Severe protein calorie  malnutrition    Code Status:     Code Status Orders  (From admission, onward)         Start     Ordered   11/30/19 1954  Full code  Continuous        11/30/19 1953        Code Status History    Date Active Date Inactive Code Status Order ID Comments User Context   08/11/2013 1554 08/18/2013 1656 Full Code [id number]  Bary Leriche, PA-C Inpatient   Advance Care Planning Activity     Family Communication: Spoke with wife on the phone Disposition Plan: Status is: Inpatient  Dispo: The patient is from: Home              Anticipated d/c is to: Home              Anticipated d/c date is: Potential 12/02/2019 versus 12/03/2019              Patient currently being treated for NSTEMI medically.  Patient will be placed on heparin drip again this evening.  Time spent: 28 minutes  [place]

## 2019-12-01 NOTE — Consult Note (Signed)
ANTICOAGULATION CONSULT NOTE  Pharmacy Consult for Heparin Infusion Indication: chest pain/ACS  Patient Measurements: Heparin Dosing Weight: 52.2 kg  Labs: Recent Labs    11/30/19 1254 11/30/19 1518 11/30/19 2139 12/01/19 0405 12/01/19 0518  HGB 14.0  --   --  12.1*  --   HCT 43.0  --   --  36.9*  --   PLT 259  --   --  204  --   APTT  --   --  28  --   --   LABPROT  --   --  14.6  --   --   INR  --   --  1.2  --   --   HEPARINUNFRC  --   --   --  0.41  --   CREATININE 0.79  --   --  0.68  --   TROPONINIHS 39*   < > 7,062* 16,230* 21,619*   < > = values in this interval not displayed.    Estimated Creatinine Clearance: 61 mL/min (by C-G formula based on SCr of 0.68 mg/dL).   Medical History: Past Medical History:  Diagnosis Date  . Aortic calcification (HCC) 07/2015   Noted on chest CT with contrast  . Cataract   . Centrilobular emphysema ([place]) 08/03/2015   Noted on chest CT July 2017  . 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[place]) 07/29/2015  . Coronary artery calcification seen on CAT scan 07/29/2015   Noted on CT scan July 2017  . 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 Guillain Barr syndrome ([place])   . History of colonic polyps   . Hyperlipidemia   . Hypochloremia   . Osteopenia 08/29/2016   August 2018; next scan August 2020  . Right Supraclavicular Lymph node 08/03/2015  . Solitary pulmonary nodule 08/03/2015   CT Chest 07/2015 - Subpleural 3 mm left upper lobe pulmonary nodule, probably benign.  . Tobacco abuse     Medications:  No anticoagulation prior to admission per my chart review   Assessment: Patient is a 75 y/o M with medical history as above who presented to the ED 11/21 with chest pain. Troponin 39 >> 253 >> 3589. Pharmacy has been consulted to initiate heparin infusion for suspected ACS.  Baseline CBC within normal limits. Baseline aPTT and PT-INR pending.   11/22 0405 HL 0.41, therapeutic x 1  Goal of Therapy:  Heparin level  0.3-0.7 units/ml Monitor platelets by anticoagulation protocol: Yes   Plan:   Patient went for cath, Heparin is to resume 8 hours after sheath removal. Sheath removed at 10:00.  Will resume Heparin 600 units/hr at 18:00  Heparin level 8 hours after start of infusion at 02:00 11/23  Daily CBC while on Heparin infusion  Paulina Fusi, PharmD, BCPS 12/01/2019 4:03 PM

## 2019-12-01 NOTE — Progress Notes (Signed)
Initial Nutrition Assessment  DOCUMENTATION CODES:   Severe malnutrition in context of social or environmental circumstances  INTERVENTION:   Ensure Enlive po BID, each supplement provides 350 kcal and 20 grams of protein  MVI daily   NUTRITION DIAGNOSIS:   Severe Malnutrition related to social / environmental circumstances as evidenced by moderate fat depletion, severe fat depletion, moderate muscle depletion, severe muscle depletion.  GOAL:   Patient will meet greater than or equal to 90% of their needs  MONITOR:   PO intake, Supplement acceptance, Labs, Weight trends, Skin, I & O's  REASON FOR ASSESSMENT:   Other (Comment) (Low BMI)    ASSESSMENT:   74 y.o. male with medical history significant for Guillain Barr syndrome, tobacco use, hyperlipidemia and coronary artery disease who presents with NSTEMI   Pt s/p cardiac cath today  Met with pt in room today. Pt reports that he does not feel well today. Pt reports poor appetite and oral intake at baseline. Pt NPO this morning for cardiac cath; pt has since been initiated on a heart healthy diet but pt reports that he does not feel like eating at this time. Pt reports that he does enjoy vanilla Ensure. RD will add supplements and MVI to help pt meet his estimated needs. Per chart, pt is down 7lbs(6%) over the past 6 months; this is not significant.   Medications reviewed and include: aspirin, zofran, heparin  Labs reviewed:   NUTRITION - FOCUSED PHYSICAL EXAM:    Most Recent Value  Orbital Region Moderate depletion  Upper Arm Region Severe depletion  Buccal Region Moderate depletion  Temple Region Moderate depletion  Clavicle Bone Region Severe depletion  Clavicle and Acromion Bone Region Severe depletion  Scapular Bone Region Moderate depletion  Dorsal Hand Severe depletion  Patellar Region Severe depletion  Anterior Thigh Region Severe depletion  Posterior Calf Region Severe depletion  Edema (RD Assessment)  None  Hair Reviewed  Eyes Reviewed  Mouth Reviewed  Skin Reviewed  Nails Reviewed     Diet Order:   Diet Order            Diet Heart Room service appropriate? Yes; Fluid consistency: Thin  Diet effective now                EDUCATION NEEDS:   Education needs have been addressed  Skin:  Skin Assessment: Reviewed RN Assessment  Last BM:  PTA  Height:   Ht Readings from Last 1 Encounters:  11/30/19 5' 9"  (1.753 m)    Weight:   Wt Readings from Last 1 Encounters:  12/01/19 52.4 kg    Ideal Body Weight:  72.7 kg  BMI:  Body mass index is 17.06 kg/m.  Estimated Nutritional Needs:   Kcal:  1700-1900kcal/day  Protein:  85-95g/day  Fluid:  1.3-1.6L/day  Koleen Distance MS, RD, LDN Please refer to [facility] for RD and/or RD on-call/weekend/after hours pager

## 2019-12-01 NOTE — Progress Notes (Signed)
Dr Oval Linsey, cardiology, paged [place] [id number] per Sharion Settler, NP.

## 2019-12-02 DIAGNOSIS — F17210 Nicotine dependence, cigarettes, uncomplicated: Secondary | ICD-10-CM | POA: Diagnosis not present

## 2019-12-02 DIAGNOSIS — I959 Hypotension, unspecified: Secondary | ICD-10-CM | POA: Diagnosis not present

## 2019-12-02 DIAGNOSIS — I472 Ventricular tachycardia, unspecified: Secondary | ICD-10-CM

## 2019-12-02 DIAGNOSIS — I214 Non-ST elevation (NSTEMI) myocardial infarction: Secondary | ICD-10-CM | POA: Diagnosis not present

## 2019-12-02 LAB — CBC
HCT: 36.1 % — ABNORMAL LOW (ref 39.0–52.0)
Hemoglobin: 11.9 g/dL — ABNORMAL LOW (ref 13.0–17.0)
MCH: 30.8 pg (ref 26.0–34.0)
MCHC: 33 g/dL (ref 30.0–36.0)
MCV: 93.5 fL (ref 80.0–100.0)
Platelets: 191 10*3/uL (ref 150–400)
RBC: 3.86 MIL/uL — ABNORMAL LOW (ref 4.22–5.81)
RDW: 12.4 % (ref 11.5–15.5)
WBC: 10.4 10*3/uL (ref 4.0–10.5)
nRBC: 0 % (ref 0.0–0.2)

## 2019-12-02 LAB — ECHOCARDIOGRAM LIMITED
AR max vel: 2.11 cm2
AV Peak grad: 4.6 mmHg
Ao pk vel: 1.07 m/s
Area-P 1/2: 5.88 cm2
Height: 69 in
S' Lateral: 2.86 cm
Weight: 1848.34 oz

## 2019-12-02 LAB — BASIC METABOLIC PANEL
Anion gap: 7 (ref 5–15)
BUN: 16 mg/dL (ref 8–23)
CO2: 27 mmol/L (ref 22–32)
Calcium: 8.3 mg/dL — ABNORMAL LOW (ref 8.9–10.3)
Chloride: 99 mmol/L (ref 98–111)
Creatinine, Ser: 0.85 mg/dL (ref 0.61–1.24)
GFR, Estimated: >60 mL/min (ref >60)
Glucose, Bld: 130 mg/dL — ABNORMAL HIGH (ref 70–99)
Potassium: 3.7 mmol/L (ref 3.5–5.1)
Sodium: 133 mmol/L — ABNORMAL LOW (ref 135–145)

## 2019-12-02 LAB — MAGNESIUM: Magnesium: 2.1 mg/dL (ref 1.7–2.4)

## 2019-12-02 LAB — HEPARIN LEVEL (UNFRACTIONATED)
Heparin Unfractionated: 0.21 IU/mL — ABNORMAL LOW (ref 0.30–0.70)
Heparin Unfractionated: 0.35 IU/mL (ref 0.30–0.70)
Heparin Unfractionated: 0.38 IU/mL (ref 0.30–0.70)

## 2019-12-02 MED ORDER — METOPROLOL SUCCINATE ER 25 MG PO TB24
12.5000 mg | ORAL_TABLET | Freq: Every day | ORAL | Status: DC
  Administered 2019-12-02 – 2019-12-03 (×2): 12.5 mg via ORAL
  Filled 2019-12-02 (×2): qty 1

## 2019-12-02 MED ORDER — POTASSIUM CHLORIDE CRYS ER 20 MEQ PO TBCR
30.0000 meq | EXTENDED_RELEASE_TABLET | Freq: Once | ORAL | Status: AC
  Administered 2019-12-02: 30 meq via ORAL
  Filled 2019-12-02: qty 1

## 2019-12-02 NOTE — Progress Notes (Signed)
Patient ID: Brent [name], male   DOB: 12/17/1945, 74 y.o.   MRN: [id number] [facility] PROGRESS NOTE  Brent [name] VQQ:[id number] DOB: [birth year] DOA: 11/30/2019 PCP: Towanda Malkin, MD  HPI/Subjective: Patient this morning had some chest pain. With walking with the nursing staff he did have an episode of nonsustained ventricular tachycardia. Patient was admitted with NSTEMI and medical management was decided.  Objective: Vitals:   12/02/19 0745 12/02/19 1148  BP: (!) 108/57 (!) 100/56  Pulse: 87 82  Resp: 17 18  Temp: 98.1 F (36.7 C) 98.8 F (37.1 C)  SpO2: 96% 98%    Intake/Output Summary (Last 24 hours) at 12/02/2019 1558 Last data filed at 12/02/2019 1345 Gross per 24 hour  Intake 447.18 ml  Output 1050 ml  Net -602.82 ml   Filed Weights   11/30/19 2013 12/01/19 0300 12/02/19 0500  Weight: 52.2 kg 52.4 kg 54.5 kg    ROS: Review of Systems  Respiratory: Negative for shortness of breath.   Cardiovascular: Positive for chest pain.  Gastrointestinal: Negative for abdominal pain, nausea and vomiting.   Exam: Physical Exam HENT:     Head: Normocephalic.     Mouth/Throat:     Pharynx: No oropharyngeal exudate.  Eyes:     General: Lids are normal.     Conjunctiva/sclera: Conjunctivae normal.     Pupils: Pupils are equal, round, and reactive to light.  Cardiovascular:     Rate and Rhythm: Normal rate and regular rhythm.     Heart sounds: Normal heart sounds, S1 normal and S2 normal.  Pulmonary:     Breath sounds: No decreased breath sounds, wheezing, rhonchi or rales.  Abdominal:     Palpations: Abdomen is soft.     Tenderness: There is no abdominal tenderness.  Musculoskeletal:     Right ankle: No swelling.     Left ankle: No swelling.  Skin:    General: Skin is warm.     Findings: No rash.  Neurological:     Mental Status: He is alert and oriented to person, place, and time.       Data Reviewed: Basic Metabolic  Panel: Recent Labs  Lab 11/30/19 1254 12/01/19 0405 12/02/19 1107  NA 136 139 133*  K 4.1 4.2 3.7  CL 98 102 99  CO2 27 26 27   GLUCOSE 122* 102* 130*  BUN 9 9 16   CREATININE 0.79 0.68 0.85  CALCIUM 9.1 8.1* 8.3*  MG  --   --  2.1   CBC: Recent Labs  Lab 11/30/19 1254 12/01/19 0405 12/02/19 0235  WBC 5.8 8.4 10.4  HGB 14.0 12.1* 11.9*  HCT 43.0 36.9* 36.1*  MCV 95.3 95.6 93.5  PLT 259 204 191    Recent Results (from the past 240 hour(s))  Resp Panel by RT-PCR (Flu A&B, Covid) Nasopharyngeal Swab     Status: None   Collection Time: 11/30/19  4:13 PM   Specimen: Nasopharyngeal Swab; Nasopharyngeal(NP) swabs in vial transport medium  Result Value Ref Range Status   SARS Coronavirus 2 by RT PCR NEGATIVE NEGATIVE Final    Comment: (NOTE) SARS-CoV-2 target nucleic acids are NOT DETECTED.  The SARS-CoV-2 RNA is generally detectable in upper respiratory specimens during the acute phase of infection. The lowest concentration of SARS-CoV-2 viral copies this assay can detect is 138 copies/mL. A negative result does not preclude SARS-Cov-2 infection and should not be used as the sole basis for treatment or other patient management decisions. A negative result  may occur with  improper specimen collection/handling, submission of specimen other than nasopharyngeal swab, presence of viral mutation(s) within the areas targeted by this assay, and inadequate number of viral copies(<138 copies/mL). A negative result must be combined with clinical observations, patient history, and epidemiological information. The expected result is Negative.  Fact Sheet for Patients:  EntrepreneurPulse.com.au  Fact Sheet for Healthcare Providers:  IncredibleEmployment.be  This test is no t yet approved or cleared by the [place] FDA and  has been authorized for detection and/or diagnosis of SARS-CoV-2 by FDA under an Emergency Use Authorization (EUA). This  EUA will remain  in effect (meaning this test can be used) for the duration of the COVID-19 declaration under Section 564(b)(1) of the Act, 21 U.S.C.section 360bbb-3(b)(1), unless the authorization is terminated  or revoked sooner.       Influenza A by PCR NEGATIVE NEGATIVE Final   Influenza B by PCR NEGATIVE NEGATIVE Final    Comment: (NOTE) The Xpert Xpress SARS-CoV-2/FLU/RSV plus assay is intended as an aid in the diagnosis of influenza from Nasopharyngeal swab specimens and should not be used as a sole basis for treatment. Nasal washings and aspirates are unacceptable for Xpert Xpress SARS-CoV-2/FLU/RSV testing.  Fact Sheet for Patients: EntrepreneurPulse.com.au  Fact Sheet for Healthcare Providers: IncredibleEmployment.be  This test is not yet approved or cleared by the [place] FDA and has been authorized for detection and/or diagnosis of SARS-CoV-2 by FDA under an Emergency Use Authorization (EUA). This EUA will remain in effect (meaning this test can be used) for the duration of the COVID-19 declaration under Section 564(b)(1) of the Act, 21 U.S.C. section 360bbb-3(b)(1), unless the authorization is terminated or revoked.  Performed at [facility], [address] [place], [place] [address]      Studies: CARDIAC CATHETERIZATION  Result Date: 12/01/2019  The left ventricular systolic function is normal.  LV end diastolic pressure is moderately elevated.  The left ventricular ejection fraction is 55-65% by visual estimate.  Prox RCA lesion is 90% stenosed.  Prox LAD to Mid LAD lesion is 40% stenosed.  Mid LAD lesion is 20% stenosed.  1st Diag lesion is 70% stenosed.  Ramus lesion is 60% stenosed.  Mid Cx to Dist Cx lesion is 70% stenosed.  Mid LM to Dist LM lesion is 30% stenosed.  1. Borderline significant one-vessel coronary artery disease affecting the mid left circumflex. The coronary arteries are heavily  calcified especially in the proximal segment with no clear culprit for non-ST elevation myocardial infarction. 2. Normal LV systolic function mildly moderately elevated left ventricular end-diastolic pressure at 22 mmHg. Recommendations: I do not see a clear culprit for non-ST elevation myocardial infarction. Exact etiology for this degree of troponin elevation is still not entirely clear but could be due to an occluded vessel with subsequent recanalization. I am going to add Plavix in addition to aspirin to be used for at least a year. I increase atorvastatin to 80 mg. We will obtain an echocardiogram to evaluate RV function and make sure there is no other etiology for troponin elevation.   CT Angio Chest/Abd/Pel for Dissection W and/or W/WO  Result Date: 11/30/2019 CLINICAL DATA:  Chest pain.  Back pain.  Concern for dissection. EXAM: CT ANGIOGRAPHY CHEST, ABDOMEN AND PELVIS TECHNIQUE: Non-contrast CT of the chest was initially obtained. Multidetector CT imaging through the chest, abdomen and pelvis was performed using the standard protocol during bolus administration of intravenous contrast. Multiplanar reconstructed images and MIPs were obtained and reviewed  to evaluate the vascular anatomy. CONTRAST:  127mL OMNIPAQUE IOHEXOL 350 MG/ML SOLN COMPARISON:  CT chest dated 11/07/2019 FINDINGS: CTA CHEST FINDINGS Cardiovascular: There is no evidence for thoracic aortic dissection or aneurysm. There are atherosclerotic changes of the thoracic aorta and coronary arteries. There is no large centrally located pulmonary embolism. The heart size is unremarkable. Mediastinum/Nodes: -- No mediastinal lymphadenopathy. --mild right hilar adenopathy is noted. -- No axillary lymphadenopathy. -- No supraclavicular lymphadenopathy. -- Normal thyroid gland where visualized. -  Unremarkable esophagus. Lungs/Pleura: There is persistent scarring at the lung apices bilaterally, similar to prior study. There is similar  pleuroparenchymal scarring along the peripheral right upper lobe. There is no focal infiltrate. There is no large pleural effusion. Musculoskeletal: No chest wall abnormality. No bony spinal canal stenosis. Review of the MIP images confirms the above findings. CTA ABDOMEN AND PELVIS FINDINGS VASCULAR Aorta: There are atherosclerotic changes of the abdominal aorta. There is no evidence for abdominal aortic aneurysm. Celiac: There is moderate narrowing at the origin of the celiac axis. SMA: Patent without evidence of aneurysm, dissection, vasculitis or significant stenosis. Renals: Both renal arteries are patent without evidence of aneurysm, dissection, vasculitis, fibromuscular dysplasia or significant stenosis. 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 Inflow: There are atherosclerotic changes of both common iliac arteries resulting in at least mild bilateral narrowing. There is moderate stenosis at the origin of the right external iliac artery. Veins: No obvious venous abnormality within the limitations of this arterial phase study. Review of the MIP images confirms the above findings. NON-VASCULAR Hepatobiliary: The liver is normal. Cholelithiasis without acute inflammation.There is no biliary ductal dilation. Pancreas: Normal contours without ductal dilatation. No peripancreatic fluid collection. Spleen: Unremarkable. Adrenals/Urinary Tract: --Adrenal glands: Unremarkable. --Right kidney/ureter: No hydronephrosis or radiopaque kidney stones. --Left kidney/ureter: No hydronephrosis or radiopaque kidney stones. --Urinary bladder: The urinary bladder is distended. Stomach/Bowel: --Stomach/Duodenum: No hiatal hernia or other gastric abnormality. Normal duodenal course and caliber. --Small bowel: Unremarkable. --Colon: Unremarkable. --Appendix: Normal. Lymphatic: --No retroperitoneal lymphadenopathy. --No mesenteric lymphadenopathy. --No pelvic or inguinal lymphadenopathy. Reproductive: The prostate gland is enlarged. Other: No  ascites or free air. There is a fat containing left inguinal hernia which also contains a small portion of the colon without evidence for obstruction. Musculoskeletal. No acute displaced fractures. Review of the MIP images confirms the above findings. IMPRESSION: 1. No evidence of thoracic or abdominal aortic dissection or aneurysm. 2. No acute findings in the chest, abdomen or pelvis. 3. Cholelithiasis without acute inflammation. 4. Enlarged prostate gland. Aortic Atherosclerosis (ICD10-I70.0). Electronically Signed   By: Constance Holster M.D.   On: 11/30/2019 23:16   ECHOCARDIOGRAM LIMITED  Result Date: 12/02/2019    ECHOCARDIOGRAM LIMITED REPORT   Patient Name:   Brent [name] Date of Exam: 12/01/2019 Medical Rec #:  [id number]           Height:       69.0 in Accession #:    [id number]          Weight:       115.5 lb Date of Birth:  1945/11/09            BSA:          1.636 m Patient Age:    51 years            BP:           136/82 mmHg Patient Gender: M  HR:           64 bpm. Exam Location:  [facility] Procedure: 2D Echo, Cardiac Doppler and Color Doppler Indications:     Acute myocardial infarction 410  History:         Patient has no prior history of Echocardiogram examinations.                  Previous Myocardial Infarction and CAD.  Sonographer:     Alyse Low Roar Referring Phys:  [place] Phys: Nelva Bush MD IMPRESSIONS  1. Left ventricular ejection fraction, by estimation, is 55 to 60%. The left ventricle has normal function. The left ventricle demonstrates regional wall motion abnormalities (see scoring diagram/findings for description). Left ventricular diastolic parameters are consistent with Grade II diastolic dysfunction (pseudonormalization). Elevated left atrial pressure. There is akinesis of the left ventricular, basal inferior segment. There is mild hypokinesis of the left ventricular, mid inferior segment.  2. Right ventricular systolic function  is normal. The right ventricular size is normal. There is normal pulmonary artery systolic pressure.  3. Left atrial size was mildly dilated.  4. The mitral valve is normal in structure. Trivial mitral valve regurgitation. No evidence of mitral stenosis.  5. The aortic valve is tricuspid. Aortic valve regurgitation is not visualized. No aortic stenosis is present.  6. The inferior vena cava is normal in size with <50% respiratory variability, suggesting right atrial pressure of 8 mmHg. FINDINGS  Left Ventricle: Left ventricular ejection fraction, by estimation, is 55 to 60%. The left ventricle has normal function. The left ventricle demonstrates regional wall motion abnormalities. Mild hypokinesis of the left ventricular, mid inferior segment. The left ventricular internal cavity size was normal in size. There is borderline left ventricular hypertrophy. Left ventricular diastolic parameters are consistent with Grade II diastolic dysfunction (pseudonormalization). Elevated left atrial pressure. Right Ventricle: The right ventricular size is normal. No increase in right ventricular wall thickness. Right ventricular systolic function is normal. There is normal pulmonary artery systolic pressure. The tricuspid regurgitant velocity is 2.39 m/s, and  with an assumed right atrial pressure of 8 mmHg, the estimated right ventricular systolic pressure is 96.0 mmHg. Left Atrium: Left atrial size was mildly dilated. Right Atrium: Right atrial size was normal in size. Pericardium: There is no evidence of pericardial effusion. Mitral Valve: The mitral valve is normal in structure. Mild mitral annular calcification. Trivial mitral valve regurgitation. No evidence of mitral valve stenosis. Tricuspid Valve: The tricuspid valve is grossly normal. Tricuspid valve regurgitation is mild. Aortic Valve: The aortic valve is tricuspid. Aortic valve regurgitation is not visualized. No aortic stenosis is present. Aortic valve peak gradient  measures 4.6 mmHg. Pulmonic Valve: The pulmonic valve was not well visualized. Pulmonic valve regurgitation is trivial. No evidence of pulmonic stenosis. Aorta: The aortic root is normal in size and structure. Pulmonary Artery: The pulmonary artery is not well seen. Venous: The inferior vena cava is normal in size with less than 50% respiratory variability, suggesting right atrial pressure of 8 mmHg. IAS/Shunts: The interatrial septum was not well visualized. LEFT VENTRICLE PLAX 2D LVIDd:         3.82 cm  Diastology LVIDs:         2.86 cm  LV e' medial:    5.87 cm/s LV PW:         0.91 cm  LV E/e' medial:  21.1 LV IVS:        1.05 cm  LV e' lateral:  7.83 cm/s LVOT diam:     1.80 cm  LV E/e' lateral: 15.8 LVOT Area:     2.54 cm  RIGHT VENTRICLE RV Mid diam:    2.96 cm RV S prime:     14.30 cm/s TAPSE (M-mode): 2.2 cm LEFT ATRIUM             Index       RIGHT ATRIUM           Index LA diam:        3.50 cm 2.14 cm/m  RA Area:     11.50 cm LA Vol (A2C):   66.0 ml 40.34 ml/m RA Volume:   26.40 ml  16.14 ml/m LA Vol (A4C):   44.2 ml 27.01 ml/m LA Biplane Vol: 54.9 ml 33.55 ml/m  AORTIC VALVE                PULMONIC VALVE AV Area (Vmax): 2.11 cm    PV Vmax:        0.81 m/s AV Vmax:        107.00 cm/s PV Peak grad:   2.7 mmHg AV Peak Grad:   4.6 mmHg    RVOT Peak grad: 1 mmHg LVOT Vmax:      88.80 cm/s  AORTA Ao Root diam: 2.50 cm MITRAL VALVE                TRICUSPID VALVE MV Area (PHT): 5.88 cm     TR Peak grad:   22.8 mmHg MV Decel Time: 129 msec     TR Vmax:        239.00 cm/s MV E velocity: 124.00 cm/s MV A velocity: 101.00 cm/s  SHUNTS MV E/A ratio:  1.23         Systemic Diam: 1.80 cm MV A Prime:    8.0 cm/s Nelva Bush MD Electronically signed by Nelva Bush MD Signature Date/Time: 12/02/2019/6:51:23 AM    Final     Scheduled Meds: . aspirin  81 mg Oral Daily  . atorvastatin  80 mg Oral Daily  . clopidogrel  75 mg Oral Q breakfast  . feeding supplement  237 mL Oral BID BM  . metoprolol  succinate  12.5 mg Oral Daily  . multivitamin with minerals  1 tablet Oral Daily  . sodium chloride flush  3 mL Intravenous Q12H   Continuous Infusions: . sodium chloride    . heparin 750 Units/hr (12/02/19 0327)    Assessment/Plan:  1. NSTEMI. Patient's troponin went up to 21,619. Cardiac catheterization was done and cardiology decided on medical management. Patient will be on heparin drip for another night. Low-dose beta-blocker. Continue aspirin, Plavix and atorvastatin. Cardiology to assess tomorrow to see if stable for discharge or will need a relook cardiac catheterization. 2. Nonsustained ventricular tachycardia. Potassium replaced today magnesium level in the normal range. Echocardiogram showed a normal EF. 3. Borderline hypotension. Continue to monitor on low-dose metoprolol. 4. Tobacco abuse. Patient only smokes 3 to 4 cigarettes a day. 5. Severe protein calorie malnutrition 6. Previous history of Guillain-Barr over 5 years ago      Code Status:     Code Status Orders  (From admission, onward)         Start     Ordered   11/30/19 1954  Full code  Continuous        11/30/19 1953        Code Status History    Date Active Date Inactive Code Status Order ID  Comments User Context   08/11/2013 1554 08/18/2013 1656 Full Code [id number]  Bary Leriche, PA-C Inpatient   Advance Care Planning Activity     Family Communication: Left message for patient's wife on the phone Disposition Plan: Status is: Inpatient  Dispo: The patient is from: Home              Anticipated d/c is to: Home              Anticipated d/c date is: 12/03/2019              Patient currently had chest pain this morning and nonsustained ventricular tachycardia and will be watched another night in the hospital. Medical management for NSTEMI at this time. Continue heparin drip overnight  Consultants:  Cardiology  Procedures:  Cardiac catheterization  Time spent: 28 minutes  [place]

## 2019-12-02 NOTE — Consult Note (Signed)
ANTICOAGULATION CONSULT NOTE  Pharmacy Consult for Heparin Infusion Indication: chest pain/ACS  Patient Measurements: Heparin Dosing Weight: 52.2 kg  Labs: Recent Labs    11/30/19 1254 11/30/19 1254 11/30/19 1518 11/30/19 2139 12/01/19 0405 12/01/19 0405 12/01/19 0518 12/02/19 0235 12/02/19 1107 12/02/19 1755  HGB 14.0   < >  --   --  12.1*  --   --  11.9*  --   --   HCT 43.0  --   --   --  36.9*  --   --  36.1*  --   --   PLT 259  --   --   --  204  --   --  191  --   --   APTT  --   --   --  28  --   --   --   --   --   --   LABPROT  --   --   --  14.6  --   --   --   --   --   --   INR  --   --   --  1.2  --   --   --   --   --   --   HEPARINUNFRC  --   --   --   --  0.41   < >  --  0.21* 0.35 0.38  CREATININE 0.79  --   --   --  0.68  --   --   --  0.85  --   TROPONINIHS 39*  --    < > 7,062* 16,230*  --  21,619*  --   --   --    < > = values in this interval not displayed.    Estimated Creatinine Clearance: 59.7 mL/min (by C-G formula based on SCr of 0.85 mg/dL).   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   Tobacco abuse     Medications:  No anticoagulation prior to admission per my chart review   Assessment: Patient is a 74 y/o M with medical history as  above who presented to the ED 11/21 with chest pain. Troponin 39 >> 253 >> 3589. Pharmacy has been consulted to initiate heparin infusion for suspected ACS. No evidence of NSTEMi on cath 11/22. No clear reason for elevated trops.  Plan to DAPT for at least one year.  11/22 0405 HL 0.41, therapeutic x 1 11/23 0235 HL 0.21, subtherapeutic 11/23 1107 HL 0.35 therapeutic x 1 11/23 1755 HL 0.38, therapeutic x 2  Goal of Therapy:  Heparin level 0.3-0.7 units/ml Monitor platelets by anticoagulation protocol: Yes   Plan:  Heparin level is therapeutic x 2. Will continue heparin infusion  at 750 units/hr. Recheck heparin level tomorrow AM. CBC daily while on heparin. S/p cath 11/23.   Benita Gutter 12/02/2019 7:03 PM

## 2019-12-02 NOTE — Progress Notes (Signed)
Progress Note  Patient Name: Brent [name] Date of Encounter: 12/02/2019  Primary Cardiologist: New to [facility] - consult by Fletcher Anon  Subjective   LHC 11/22 with borderline significant 1-vessel CAD affecting the mid LCx with the coronary arteries being heavily calcified, particularly in the proximal segment with no clear culprit for NSTEMI. Unable to exclude an occluded vessel with subsequent recanalization. Medical management was advised. Echo showed an EF of 55-60%, akinesis of the basal inferior segment with mild hypokinesis of the mid inferior segment, Gr2DD, normal RVSF, ventricular cavity size, PASP, mildly dilated left atrium, trivial mitral regurgitation, and a right atrial pressure of 8.   Currently, notes some substernal chest "soreness"  That is improved from his presentation. No dyspnea, palpitations, dizziness, presyncope or syncope. Dinner remains at bedside.   Inpatient Medications    Scheduled Meds: . aspirin  81 mg Oral Daily  . atorvastatin  80 mg Oral Daily  . clopidogrel  75 mg Oral Q breakfast  . feeding supplement  237 mL Oral BID BM  . multivitamin with minerals  1 tablet Oral Daily  . sodium chloride flush  3 mL Intravenous Q12H   Continuous Infusions: . sodium chloride    . heparin 750 Units/hr (12/02/19 0327)   PRN Meds: sodium chloride, acetaminophen, albuterol, ondansetron (ZOFRAN) IV, sodium chloride flush   Vital Signs    Vitals:   12/01/19 1554 12/01/19 2035 12/02/19 0500 12/02/19 0745  BP: (!) 126/55 120/64 (!) 104/56 (!) 108/57  Pulse: 76 98 93 87  Resp: 20 18 18 17   Temp: 99.8 F (37.7 C) 99.2 F (37.3 C) 99.8 F (37.7 C) 98.1 F (36.7 C)  TempSrc: Oral Oral Oral Oral  SpO2: 96% 100% 97% 96%  Weight:   54.5 kg   Height:        Intake/Output Summary (Last 24 hours) at 12/02/2019 0813 Last data filed at 12/02/2019 0500 Gross per 24 hour  Intake 87.18 ml  Output 1050 ml  Net -962.82 ml   Filed Weights   11/30/19 2013  12/01/19 0300 12/02/19 0500  Weight: 52.2 kg 52.4 kg 54.5 kg    Telemetry    SR - Personally Reviewed  ECG    No new tracings - Personally Reviewed  Physical Exam   GEN: No acute distress. Frail appearing.   Neck: No JVD. Cardiac: RRR, no murmurs, rubs, or gallops. Right radial cardiac cath site is is well healing without bleeding, bruising, swelling, warmth, or erythema. Radial pulse 2+. Respiratory: Clear to auscultation bilaterally.  GI: Soft, nontender, non-distended.   MS: No edema; No deformity. Neuro:  Alert and oriented x 3; Nonfocal.  Psych: Normal affect.  Labs    Chemistry Recent Labs  Lab 11/30/19 1254 12/01/19 0405  NA 136 139  K 4.1 4.2  CL 98 102  CO2 27 26  GLUCOSE 122* 102*  BUN 9 9  CREATININE 0.79 0.68  CALCIUM 9.1 8.1*  GFRNONAA >60 >60  ANIONGAP 11 11     Hematology Recent Labs  Lab 11/30/19 1254 12/01/19 0405 12/02/19 0235  WBC 5.8 8.4 10.4  RBC 4.51 3.86* 3.86*  HGB 14.0 12.1* 11.9*  HCT 43.0 36.9* 36.1*  MCV 95.3 95.6 93.5  MCH 31.0 31.3 30.8  MCHC 32.6 32.8 33.0  RDW 12.5 12.7 12.4  PLT 259 204 191    Cardiac EnzymesNo results for input(s): TROPONINI in the last 168 hours. No results for input(s): TROPIPOC in the last 168 hours.   BNPNo  results for input(s): BNP, PROBNP in the last 168 hours.   DDimer No results for input(s): DDIMER in the last 168 hours.   Radiology    DG Chest 2 View  Result Date: 11/30/2019 IMPRESSION: No active cardiopulmonary disease. Electronically Signed   By: Davina Poke D.O.   On: 11/30/2019 13:53    CT Angio Chest/Abd/Pel for Dissection W and/or W/WO  Result Date: 11/30/2019 IMPRESSION: 1. No evidence of thoracic or abdominal aortic dissection or aneurysm. 2. No acute findings in the chest, abdomen or pelvis. 3. Cholelithiasis without acute inflammation. 4. Enlarged prostate gland. Aortic Atherosclerosis (ICD10-I70.0). Electronically Signed   By: Constance Holster M.D.   On:  11/30/2019 23:16    Cardiac Studies   LHC 12/01/2019:  The left ventricular systolic function is normal.  LV end diastolic pressure is moderately elevated.  The left ventricular ejection fraction is 55-65% by visual estimate.  Prox RCA lesion is 90% stenosed.  Prox LAD to Mid LAD lesion is 40% stenosed.  Mid LAD lesion is 20% stenosed.  1st Diag lesion is 70% stenosed.  Ramus lesion is 60% stenosed.  Mid Cx to Dist Cx lesion is 70% stenosed.  Mid LM to Dist LM lesion is 30% stenosed.   1. Borderline significant one-vessel coronary artery disease affecting the mid left circumflex. The coronary arteries are heavily calcified especially in the proximal segment with no clear culprit for non-ST elevation myocardial infarction. 2. Normal LV systolic function mildly moderately elevated left ventricular end-diastolic pressure at 22 mmHg.  Recommendations: I do not see a clear culprit for non-ST elevation myocardial infarction. Exact etiology for this degree of troponin elevation is still not entirely clear but could be due to an occluded vessel with subsequent recanalization. I am going to add Plavix in addition to aspirin to be used for at least a year. I increase atorvastatin to 80 mg. We will obtain an echocardiogram to evaluate RV function and make sure there is no other etiology for troponin elevation. ___________  2D echo 11/22/201: 1. Left ventricular ejection fraction, by estimation, is 55 to 60%. The  left ventricle has normal function. The left ventricle demonstrates  regional wall motion abnormalities (see scoring diagram/findings for  description). Left ventricular diastolic  parameters are consistent with Grade II diastolic dysfunction  (pseudonormalization). Elevated left atrial pressure. There is akinesis of  the left ventricular, basal inferior segment. There is mild hypokinesis of  the left ventricular, mid inferior  segment.  2. Right ventricular systolic  function is normal. The right ventricular  size is normal. There is normal pulmonary artery systolic pressure.  3. Left atrial size was mildly dilated.  4. The mitral valve is normal in structure. Trivial mitral valve  regurgitation. No evidence of mitral stenosis.  5. The aortic valve is tricuspid. Aortic valve regurgitation is not  visualized. No aortic stenosis is present.  6. The inferior vena cava is normal in size with <50% respiratory  variability, suggesting right atrial pressure of 8 mmHg.  Patient Profile     74 y.o. male with history of CAD,Giillain-Barre syndrome, HLD, and tobacco use who was admitted with a NSTEMI.    Assessment & Plan    1. NSTEMI:  -LHC 11/22 with borderline significant 1-vessel CAD affecting the mid LCx with the coronary arteries being heavily calcified, particularly in the proximal segment with no clear culprit for NSTEMI. Unable to exclude an occluded vessel with subsequent recanalization. Medical management was advised. Echo showed an EF of 55-60%,  akinesis of the basal inferior segment with mild hypokinesis of the mid inferior segment, Gr2DD, normal RVSF, ventricular cavity size, PASP, mildly dilated left atrium, trivial mitral regurgitation, and a right atrial pressure of 8.  -Currently, notes chest soreness -Consider adding Imdur -MD to review cath films with noted WMA on echo -HS-Tn has trended to [address]  -DAPT with ASA and Plavix for the next 12 months -Lipitor  -Relative hypotension precludes addition of ACEi/ARB or beta blocker at this time -Post cath instructions   2. NSVT: -After rounding on the patient, while he was ambulating, he had a 26 beat run of NSVT -He was asymptomatic  -Last potassium 4.2 on 11/22, repeat this morning -Check magnesium level with recommendation to replete to goal 2.0 as indicated -TSH normal this admission -BP has been soft in the low 030S systolic, which has precluded addition of beta blocker this admission   -Continue to monitor   3. HLD: -LDL  -Lipitor  4. Tobacco use: -Complete cessation advised    For questions or updates, please contact [place] Please consult [email or web address] for contact info under Cardiology/STEMI.    Signed, Christell Faith, PA-C [place] Pager: [phone] 12/02/2019, 8:13 AM

## 2019-12-02 NOTE — Consult Note (Signed)
ANTICOAGULATION CONSULT NOTE  Pharmacy Consult for Heparin Infusion Indication: chest pain/ACS  Patient Measurements: Heparin Dosing Weight: 52.2 kg  Labs: Recent Labs    11/30/19 1254 11/30/19 1254 11/30/19 1518 11/30/19 2139 12/01/19 0405 12/01/19 0518 12/02/19 0235 12/02/19 1107  HGB 14.0   < >  --   --  12.1*  --  11.9*  --   HCT 43.0  --   --   --  36.9*  --  36.1*  --   PLT 259  --   --   --  204  --  191  --   APTT  --   --   --  28  --   --   --   --   LABPROT  --   --   --  14.6  --   --   --   --   INR  --   --   --  1.2  --   --   --   --   HEPARINUNFRC  --   --   --   --  0.41  --  0.21* 0.35  CREATININE 0.79  --   --   --  0.68  --   --  0.85  TROPONINIHS 39*  --    < > 7,062* 16,230* 21,619*  --   --    < > = values in this interval not displayed.    Estimated Creatinine Clearance: 59.7 mL/min (by C-G formula based on SCr of 0.85 mg/dL).   Medical History: Past Medical History:  Diagnosis Date  . Aortic calcification (HCC) 07/2015   Noted on chest CT with contrast  . Cataract   . Centrilobular emphysema ([place]) 08/03/2015   Noted on chest CT July 2017  . 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 Coronary artery calcification seen on CAT scan 07/29/2015   Noted on CT scan July 2017  . 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 Guillain Barr syndrome ([place])   . History of colonic polyps   . Hyperlipidemia   . Hypochloremia   . Osteopenia 08/29/2016   August 2018; next scan August 2020  . Right Supraclavicular Lymph node 08/03/2015  . Solitary pulmonary nodule 08/03/2015   CT Chest 07/2015 - Subpleural 3 mm left upper lobe pulmonary nodule, probably benign.  . Tobacco abuse     Medications:  No anticoagulation prior to admission per my chart review   Assessment: Patient is a 74 y/o M with medical history as above who presented to the ED 11/21 with chest pain. Troponin 39 >> 253 >> 3589. Pharmacy has been consulted to initiate  heparin infusion for suspected ACS. No evidence of NSTEMi on cath 11/22. No clear reason for elevated trops.  Plan to DAPT for at least one year.  11/22 0405 HL 0.41, therapeutic x 1 11/23 0235 HL 0.21, subtherapeutic 11/23 1107 HL 0.35 therapeutic   Goal of Therapy:  Heparin level 0.3-0.7 units/ml Monitor platelets by anticoagulation protocol: Yes   Plan:  Heparin level is therapeutic. Will continue heparin infusion at 750 units/hr. Recheck heparin level in 8 hours. CBC daily while on heparin. S/p cath 11/23.   Eleonore Chiquito, PharmD Clinical Pharmacist  12/02/2019 11:40 AM

## 2019-12-02 NOTE — Consult Note (Signed)
ANTICOAGULATION CONSULT NOTE  Pharmacy Consult for Heparin Infusion Indication: chest pain/ACS  Patient Measurements: Heparin Dosing Weight: 52.2 kg  Labs: Recent Labs    11/30/19 1254 11/30/19 1254 11/30/19 1518 11/30/19 2139 12/01/19 0405 12/01/19 0518 12/02/19 0235  HGB 14.0   < >  --   --  12.1*  --  11.9*  HCT 43.0  --   --   --  36.9*  --  36.1*  PLT 259  --   --   --  204  --  191  APTT  --   --   --  28  --   --   --   LABPROT  --   --   --  14.6  --   --   --   INR  --   --   --  1.2  --   --   --   HEPARINUNFRC  --   --   --   --  0.41  --  0.21*  CREATININE 0.79  --   --   --  0.68  --   --   TROPONINIHS 39*  --    < > 7,062* 16,230* 21,619*  --    < > = values in this interval not displayed.    Estimated Creatinine Clearance: 61 mL/min (by C-G formula based on SCr of 0.68 mg/dL).   Medical History: Past Medical History:  Diagnosis Date  . Aortic calcification (HCC) 07/2015   Noted on chest CT with contrast  . Cataract   . Centrilobular emphysema ([place]) 08/03/2015   Noted on chest CT July 2017  . 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 Coronary artery calcification seen on CAT scan 07/29/2015   Noted on CT scan July 2017  . 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 Guillain Barr syndrome ([place])   . History of colonic polyps   . Hyperlipidemia   . Hypochloremia   . Osteopenia 08/29/2016   August 2018; next scan August 2020  . Right Supraclavicular Lymph node 08/03/2015  . Solitary pulmonary nodule 08/03/2015   CT Chest 07/2015 - Subpleural 3 mm left upper lobe pulmonary nodule, probably benign.  . Tobacco abuse     Medications:  No anticoagulation prior to admission per my chart review   Assessment: Patient is a 74 y/o M with medical history as above who presented to the ED 11/21 with chest pain. Troponin 39 >> 253 >> 3589. Pharmacy has been consulted to initiate heparin infusion for suspected ACS.  Baseline CBC within  normal limits. Baseline aPTT and PT-INR pending.   11/22 0405 HL 0.41, therapeutic x 1 11/23 0235 HL 0.21, subtherapeutic  Goal of Therapy:  Heparin level 0.3-0.7 units/ml Monitor platelets by anticoagulation protocol: Yes   Plan:   HL subtherapeutic.  Will increase Heparin to 750 units/hr   Heparin level 8 hours after rate increase  Daily CBC while on Heparin infusion  Hart Robinsons, PharmD Clinical Pharmacist  12/02/2019 3:10 AM

## 2019-12-03 ENCOUNTER — Telehealth: Payer: Self-pay | Admitting: Cardiovascular Disease

## 2019-12-03 DIAGNOSIS — I214 Non-ST elevation (NSTEMI) myocardial infarction: Secondary | ICD-10-CM | POA: Diagnosis not present

## 2019-12-03 DIAGNOSIS — I472 Ventricular tachycardia: Secondary | ICD-10-CM

## 2019-12-03 DIAGNOSIS — F17210 Nicotine dependence, cigarettes, uncomplicated: Secondary | ICD-10-CM

## 2019-12-03 LAB — BASIC METABOLIC PANEL
Anion gap: 6 (ref 5–15)
BUN: 13 mg/dL (ref 8–23)
CO2: 28 mmol/L (ref 22–32)
Calcium: 8.1 mg/dL — ABNORMAL LOW (ref 8.9–10.3)
Chloride: 102 mmol/L (ref 98–111)
Creatinine, Ser: 0.83 mg/dL (ref 0.61–1.24)
GFR, Estimated: >60 mL/min (ref >60)
Glucose, Bld: 94 mg/dL (ref 70–99)
Potassium: 3.8 mmol/L (ref 3.5–5.1)
Sodium: 136 mmol/L (ref 135–145)

## 2019-12-03 LAB — CBC
HCT: 37.2 % — ABNORMAL LOW (ref 39.0–52.0)
Hemoglobin: 12.6 g/dL — ABNORMAL LOW (ref 13.0–17.0)
MCH: 31.3 pg (ref 26.0–34.0)
MCHC: 33.9 g/dL (ref 30.0–36.0)
MCV: 92.3 fL (ref 80.0–100.0)
Platelets: 191 10*3/uL (ref 150–400)
RBC: 4.03 MIL/uL — ABNORMAL LOW (ref 4.22–5.81)
RDW: 12.4 % (ref 11.5–15.5)
WBC: 8.7 10*3/uL (ref 4.0–10.5)
nRBC: 0 % (ref 0.0–0.2)

## 2019-12-03 LAB — HEPARIN LEVEL (UNFRACTIONATED): Heparin Unfractionated: 0.33 IU/mL (ref 0.30–0.70)

## 2019-12-03 MED ORDER — METOPROLOL SUCCINATE ER 25 MG PO TB24
12.5000 mg | ORAL_TABLET | Freq: Every day | ORAL | 11 refills | Status: DC
Start: 2019-12-03 — End: 2021-09-28

## 2019-12-03 MED ORDER — CLOPIDOGREL BISULFATE 75 MG PO TABS
75.0000 mg | ORAL_TABLET | Freq: Every day | ORAL | 11 refills | Status: DC
Start: 2019-12-03 — End: 2021-03-24

## 2019-12-03 MED ORDER — ATORVASTATIN CALCIUM 80 MG PO TABS
80.0000 mg | ORAL_TABLET | Freq: Every day | ORAL | 2 refills | Status: DC
Start: 2019-12-03 — End: 2020-01-23

## 2019-12-03 MED ORDER — HEPARIN SODIUM (PORCINE) 5000 UNIT/ML IJ SOLN: 5000.0000 [IU] | Freq: Three times a day (TID) | INTRAMUSCULAR | Status: DC

## 2019-12-03 NOTE — Consult Note (Signed)
ANTICOAGULATION CONSULT NOTE  Pharmacy Consult for Heparin Infusion Indication: chest pain/ACS  Patient Measurements: Heparin Dosing Weight: 52.2 kg  Labs: Recent Labs    11/30/19 1254 11/30/19 1254 11/30/19 1518 11/30/19 2139 12/01/19 0405 12/01/19 0405 12/01/19 0518 12/02/19 0235 12/02/19 0235 12/02/19 1107 12/02/19 1755 12/03/19 0437  HGB 14.0   < >  --   --  12.1*   < >  --  11.9*  --   --   --  12.6*  HCT 43.0   < >  --   --  36.9*  --   --  36.1*  --   --   --  37.2*  PLT 259   < >  --   --  204  --   --  191  --   --   --  191  APTT  --   --   --  28  --   --   --   --   --   --   --   --   LABPROT  --   --   --  14.6  --   --   --   --   --   --   --   --   INR  --   --   --  1.2  --   --   --   --   --   --   --   --   HEPARINUNFRC  --   --   --   --  0.41   < >  --  0.21*   < > 0.35 0.38 0.33  CREATININE 0.79  --   --   --  0.68  --   --   --   --  0.85  --   --   TROPONINIHS 39*  --    < > 7,062* 16,230*  --  21,619*  --   --   --   --   --    < > = values in this interval not displayed.    Estimated Creatinine Clearance: 59.9 mL/min (by C-G formula based on SCr of 0.85 mg/dL).   Medical History: Past Medical History:  Diagnosis Date  . Aortic calcification (HCC) 07/2015   Noted on chest CT with contrast  . Cataract   . Centrilobular emphysema ([place]) 08/03/2015   Noted on chest CT July 2017  . 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 Coronary artery calcification seen on CAT scan 07/29/2015   Noted on CT scan July 2017  . 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 Guillain Barr syndrome ([place])   . History of colonic polyps   . Hyperlipidemia   . Hypochloremia   . Osteopenia 08/29/2016   August 2018; next scan August 2020  . Right Supraclavicular Lymph node 08/03/2015  . Solitary pulmonary nodule 08/03/2015   CT Chest 07/2015 - Subpleural 3 mm left upper lobe pulmonary nodule, probably benign.  . Tobacco abuse      Medications:  No anticoagulation prior to admission per my chart review   Assessment: Patient is a 74 y/o M with medical history as above who presented to the ED 11/21 with chest pain. Troponin 39 >> 253 >> 3589. Pharmacy has been consulted to initiate heparin infusion for suspected ACS. No evidence of NSTEMi on cath 11/22. No clear reason for elevated trops.  Plan to DAPT for at least one year.  11/22  0405 HL 0.41, therapeutic x 1 11/23 0235 HL 0.21, subtherapeutic 11/23 1107 HL 0.35 therapeutic x 1 11/23 1755 HL 0.38, therapeutic x 2 11/24 0437 HL 0.33, therapeutic x 3  Goal of Therapy:  Heparin level 0.3-0.7 units/ml Monitor platelets by anticoagulation protocol: Yes   Plan:  Heparin level is therapeutic x 3. Will continue heparin infusion at 750 units/hr. Recheck heparin level tomorrow AM. CBC daily while on heparin. S/p cath 11/23.   Hart Robinsons A 12/03/2019 5:41 AM

## 2019-12-03 NOTE — Discharge Summary (Signed)
Physician Discharge Summary  Brent [name] UJW:[id number] DOB: 1945/05/10 DOA: 11/30/2019  PCP: Towanda Malkin, MD  Admit date: 11/30/2019 Discharge date: 12/03/2019  Time spent: 37 minutes  Recommendations for Outpatient Follow-up:  1. New medications Plavix higher dose Lipitor and added metoprolol this admission 2. Needs outpatient follow-up Dr. Fletcher Anon 1 to 2 months 3. Chem-12, CBC in 1 week 4. Cardiac rehab as outpatient  Discharge Diagnoses:  Principal Problem:   NSTEMI (non-ST elevated myocardial infarction) [facility]) Active Problems:   Coronary artery disease   Aortic calcification (HCC)   Coronary artery calcification seen on CAT scan   Cigarette smoker one half pack a day or less   Protein-calorie malnutrition, severe   Hypotension   Ventricular tachycardia [facility])   Discharge Condition: Improved  Diet recommendation: Heart healthy  Filed Weights   12/01/19 0300 12/02/19 0500 12/03/19 0437  Weight: 52.4 kg 54.5 kg 54.7 kg    History of present illness:  81 black male known HTN HLD CAD on CT scan admitted with chest pain at rest-peak pain 10/10-troponins peaked at 20,000 from 39 patient was kept on heparin Initial thought was dissection-CT a of chest was negative however Patient evaluated by Dr. Doristine Section underwent urgent cardiac cath as below without target Patient was seen and had some NSVT for which metoprolol was added during hospital stay He was felt to be relatively stabilized on 11/24 from cardiology perspective Meds were adjusted as per [facility] and he was felt stable for discharge after seen by cardiologist-follow-up per them  Procedures: Recommendations: I do not see a clear culprit for non-ST elevation myocardial infarction. Exact etiology for this degree of troponin elevation is still not entirely clear but could be due to an occluded vessel with subsequent recanalization. I am going to add Plavix in addition to aspirin to be used for at least a  year. I increase atorvastatin to 80 mg. We will obtain an echocardiogram to evaluate RV function and make sure there is no other etiology for troponin elevation.  Echocardiogram 11/22 = IMPRESSIONS    1. Left ventricular ejection fraction, by estimation, is 55 to 60%. The  left ventricle has normal function. The left ventricle demonstrates  regional wall motion abnormalities (see scoring diagram/findings for  description). Left ventricular diastolic  parameters are consistent with Grade II diastolic dysfunction  (pseudonormalization). Elevated left atrial pressure. There is akinesis of  the left ventricular, basal inferior segment. There is mild hypokinesis of  the left ventricular, mid inferior  segment.  2. Right ventricular systolic function is normal. The right ventricular  size is normal. There is normal pulmonary artery systolic pressure.  3. Left atrial size was mildly dilated.  4. The mitral valve is normal in structure. Trivial mitral valve  regurgitation. No evidence of mitral stenosis.  5. The aortic valve is tricuspid. Aortic valve regurgitation is not  visualized. No aortic stenosis is present.  6. The inferior vena cava is normal in size with <50% respiratory  variability, suggesting right atrial pressure of 8 mmHg.    Consultations:  Cardiology  Discharge Exam: Vitals:   12/03/19 0437 12/03/19 0740  BP: (!) 107/59 (!) 114/58  Pulse: 78 80  Resp:    Temp: 99 F (37.2 C) 98.2 F (36.8 C)  SpO2: 99% 100%    Awake coherent pleasant no distress EOMI NCAT no focal deficit CTA B no added sounds S1-S2 no murmur no rub no gallop Abdomen soft no rebound no guarding Neurologically intact no focal deficit Moving  all 4 limbs equally Right wrist does not have any hematoma or swelling  Discharge Instructions   Discharge Instructions    AMB Referral to Cardiac Rehabilitation - Phase II   Complete by: As directed    Diagnosis: NSTEMI   After initial  evaluation and assessments completed: Virtual Based Care may be provided alone or in conjunction with Phase 2 Cardiac Rehab based on patient barriers.: Yes   Diet - low sodium heart healthy   Complete by: As directed    Discharge instructions   Complete by: As directed    Please follow the instructions clearly including new meds this admission Plavix (blood thinner) Metoprolol XL (4 fast heartbeats that were noticed) Lipitor (higher dose because you have probably plaque in the arteries of your heart)-you will also need repeat labs for your cholesterol You will need to get post cardiac therapy in addition to cardiac rehab given the small heart attack that you might of had This can be managed with exercise diet and changing lifestyle patterns Please follow-up with your cardiologist in the outpatient setting in several weeks   Increase activity slowly   Complete by: As directed      Allergies as of 12/03/2019      Reactions   Influenza Vaccines    GUILLAIN BARRE SYNDROME   Pneumococcal Vaccine Other (See Comments)   GUILLAIN BARRE SYNDROME NUMBNESS, SOB,CHEST PAIN   Prevnar [pneumococcal 13-val Conj Vacc] Other (See Comments)   GUILLAIN-BARRE SYNDROME      Medication List    TAKE these medications   acetaminophen 325 MG tablet Commonly known as: TYLENOL Take 650 mg by mouth every 6 (six) hours as needed.   albuterol 108 (90 Base) MCG/ACT inhaler Commonly known as: VENTOLIN HFA Inhale 2 puffs into the lungs every 6 (six) hours as needed for wheezing or shortness of breath.   aspirin 81 MG EC tablet Take 1 tablet (81 mg total) by mouth daily.   atorvastatin 80 MG tablet Commonly known as: LIPITOR Take 1 tablet (80 mg total) by mouth daily. What changed:   medication strength  how much to take   clopidogrel 75 MG tablet Commonly known as: PLAVIX Take 1 tablet (75 mg total) by mouth daily with breakfast.   gabapentin 100 MG capsule Commonly known as: NEURONTIN TAKE ONE  CAPSULE BY MOUTH DAILY. ONLY AS NEEDED What changed: See the new instructions.   metoprolol succinate 25 MG 24 hr tablet Commonly known as: TOPROL-XL Take 0.5 tablets (12.5 mg total) by mouth daily.      Allergies  Allergen Reactions  . Influenza Vaccines     GUILLAIN BARRE SYNDROME   . Pneumococcal Vaccine Other (See Comments)    GUILLAIN BARRE SYNDROME NUMBNESS, SOB,CHEST PAIN   . Prevnar [Pneumococcal 13-Val Conj Vacc] Other (See Comments)    GUILLAIN-BARRE SYNDROME      The results of significant diagnostics from this hospitalization (including imaging, microbiology, ancillary and laboratory) are listed below for reference.    Significant Diagnostic Studies: DG Chest 2 View  Result Date: 11/30/2019 CLINICAL DATA:  Chest pain EXAM: CHEST - 2 VIEW COMPARISON:  03/02/2017, 11/07/2019 FINDINGS: The heart size and mediastinal contours are within normal limits. Atherosclerotic calcification of the aortic knob. Elevation and eventration of the left hemidiaphragm is unchanged from prior. Hyperinflated lungs. Both lungs are clear. The visualized skeletal structures are unremarkable. IMPRESSION: No active cardiopulmonary disease. Electronically Signed   By: Davina Poke D.O.   On: 11/30/2019 13:53   CARDIAC  CATHETERIZATION  Result Date: 12/01/2019  The left ventricular systolic function is normal.  LV end diastolic pressure is moderately elevated.  The left ventricular ejection fraction is 55-65% by visual estimate.  Prox RCA lesion is 90% stenosed.  Prox LAD to Mid LAD lesion is 40% stenosed.  Mid LAD lesion is 20% stenosed.  1st Diag lesion is 70% stenosed.  Ramus lesion is 60% stenosed.  Mid Cx to Dist Cx lesion is 70% stenosed.  Mid LM to Dist LM lesion is 30% stenosed.  1. Borderline significant one-vessel coronary artery disease affecting the mid left circumflex. The coronary arteries are heavily calcified especially in the proximal segment with no clear culprit for  non-ST elevation myocardial infarction. 2. Normal LV systolic function mildly moderately elevated left ventricular end-diastolic pressure at 22 mmHg. Recommendations: I do not see a clear culprit for non-ST elevation myocardial infarction. Exact etiology for this degree of troponin elevation is still not entirely clear but could be due to an occluded vessel with subsequent recanalization. I am going to add Plavix in addition to aspirin to be used for at least a year. I increase atorvastatin to 80 mg. We will obtain an echocardiogram to evaluate RV function and make sure there is no other etiology for troponin elevation.   US AORTA MEDICARE SCREENING  Result Date: 11/07/2019 CLINICAL DATA:  Male between 60-7 years of age with a smoking history. EXAM: US ABDOMINAL AORTA MEDICARE SCREENING TECHNIQUE: Ultrasound examination of the abdominal aorta was performed as a screening evaluation for abdominal aortic aneurysm. COMPARISON:  None. FINDINGS: Abdominal aortic measurements as follows: Proximal:  2.8 x 2.7 cm Mid:  1.6 x 1.4 cm Distal:  1.6 x 2.1 cm IMPRESSION: No evidence for an abdominal aortic aneurysm. No follow-up recommended. This recommendation follows ACR consensus guidelines: White Paper of the ACR Incidental Findings Committee II on Vascular Findings. J Am Coll Radiol 2013; 10:789-794. Electronically Signed   By: Constance Holster M.D.   On: 11/07/2019 19:56   CT CHEST LUNG CANCER SCREENING LOW DOSE WO CONTRAST  Result Date: 11/07/2019 CLINICAL DATA:  74 year old asymptomatic male current smoker with 56.25 pack-year smoking history. EXAM: CT CHEST WITHOUT CONTRAST LOW-DOSE FOR LUNG CANCER SCREENING TECHNIQUE: Multidetector CT imaging of the chest was performed following the standard protocol without IV contrast. COMPARISON:  11/07/2018 screening chest CT. FINDINGS: Cardiovascular: Normal heart size. No significant pericardial effusion/thickening. Three-vessel coronary atherosclerosis.  Atherosclerotic nonaneurysmal thoracic aorta. Normal caliber pulmonary arteries. Mediastinum/Nodes: No discrete thyroid nodules. Unremarkable esophagus. No pathologically enlarged axillary, mediastinal or hilar lymph nodes, noting limited sensitivity for the detection of hilar adenopathy on this noncontrast study. Lungs/Pleura: No pneumothorax. No pleural effusion. Mild centrilobular and paraseptal emphysema. No acute consolidative airspace disease or lung masses. No significant growth of previously visualized scattered pulmonary nodules. No new significant pulmonary nodules. Upper abdomen: No acute abnormality. Musculoskeletal: No aggressive appearing focal osseous lesions. Moderate thoracic spondylosis. IMPRESSION: 1. Lung-RADS 2, benign appearance or behavior. Continue annual screening with low-dose chest CT without contrast in 12 months. 2. Three-vessel coronary atherosclerosis. 3. Aortic Atherosclerosis (ICD10-I70.0) and Emphysema (ICD10-J43.9). Electronically Signed   By: Ilona Sorrel M.D.   On: 11/07/2019 12:04   CT Angio Chest/Abd/Pel for Dissection W and/or W/WO  Result Date: 11/30/2019 CLINICAL DATA:  Chest pain.  Back pain.  Concern for dissection. EXAM: CT ANGIOGRAPHY CHEST, ABDOMEN AND PELVIS TECHNIQUE: Non-contrast CT of the chest was initially obtained. Multidetector CT imaging through the chest, abdomen and pelvis was performed using the standard  protocol during bolus administration of intravenous contrast. Multiplanar reconstructed images and MIPs were obtained and reviewed to evaluate the vascular anatomy. CONTRAST:  130mL OMNIPAQUE IOHEXOL 350 MG/ML SOLN COMPARISON:  CT chest dated 11/07/2019 FINDINGS: CTA CHEST FINDINGS Cardiovascular: There is no evidence for thoracic aortic dissection or aneurysm. There are atherosclerotic changes of the thoracic aorta and coronary arteries. There is no large centrally located pulmonary embolism. The heart size is unremarkable. Mediastinum/Nodes: -- No  mediastinal lymphadenopathy. --mild right hilar adenopathy is noted. -- No axillary lymphadenopathy. -- No supraclavicular lymphadenopathy. -- Normal thyroid gland where visualized. -  Unremarkable esophagus. Lungs/Pleura: There is persistent scarring at the lung apices bilaterally, similar to prior study. There is similar pleuroparenchymal scarring along the peripheral right upper lobe. There is no focal infiltrate. There is no large pleural effusion. Musculoskeletal: No chest wall abnormality. No bony spinal canal stenosis. Review of the MIP images confirms the above findings. CTA ABDOMEN AND PELVIS FINDINGS VASCULAR Aorta: There are atherosclerotic changes of the abdominal aorta. There is no evidence for abdominal aortic aneurysm. Celiac: There is moderate narrowing at the origin of the celiac axis. SMA: Patent without evidence of aneurysm, dissection, vasculitis or significant stenosis. Renals: Both renal arteries are patent without evidence of aneurysm, dissection, vasculitis, fibromuscular dysplasia or significant stenosis. 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 Inflow: There are atherosclerotic changes of both common iliac arteries resulting in at least mild bilateral narrowing. There is moderate stenosis at the origin of the right external iliac artery. Veins: No obvious venous abnormality within the limitations of this arterial phase study. Review of the MIP images confirms the above findings. NON-VASCULAR Hepatobiliary: The liver is normal. Cholelithiasis without acute inflammation.There is no biliary ductal dilation. Pancreas: Normal contours without ductal dilatation. No peripancreatic fluid collection. Spleen: Unremarkable. Adrenals/Urinary Tract: --Adrenal glands: Unremarkable. --Right kidney/ureter: No hydronephrosis or radiopaque kidney stones. --Left kidney/ureter: No hydronephrosis or radiopaque kidney stones. --Urinary bladder: The urinary bladder is distended. Stomach/Bowel: --Stomach/Duodenum: No hiatal  hernia or other gastric abnormality. Normal duodenal course and caliber. --Small bowel: Unremarkable. --Colon: Unremarkable. --Appendix: Normal. Lymphatic: --No retroperitoneal lymphadenopathy. --No mesenteric lymphadenopathy. --No pelvic or inguinal lymphadenopathy. Reproductive: The prostate gland is enlarged. Other: No ascites or free air. There is a fat containing left inguinal hernia which also contains a small portion of the colon without evidence for obstruction. Musculoskeletal. No acute displaced fractures. Review of the MIP images confirms the above findings. IMPRESSION: 1. No evidence of thoracic or abdominal aortic dissection or aneurysm. 2. No acute findings in the chest, abdomen or pelvis. 3. Cholelithiasis without acute inflammation. 4. Enlarged prostate gland. Aortic Atherosclerosis (ICD10-I70.0). Electronically Signed   By: Constance Holster M.D.   On: 11/30/2019 23:16   ECHOCARDIOGRAM LIMITED  Result Date: 12/02/2019    ECHOCARDIOGRAM LIMITED REPORT   Patient Name:   MAJESTIC BRISTER Date of Exam: 12/01/2019 Medical Rec #:  [id number]           Height:       69.0 in Accession #:    [id number]          Weight:       115.5 lb Date of Birth:  1945-06-19            BSA:          1.636 m Patient Age:    69 years            BP:           136/82 mmHg Patient Gender: M  HR:           64 bpm. Exam Location:  [facility] Procedure: 2D Echo, Cardiac Doppler and Color Doppler Indications:     Acute myocardial infarction 410  History:         Patient has no prior history of Echocardiogram examinations.                  Previous Myocardial Infarction and CAD.  Sonographer:     Alyse Low Roar Referring Phys:  [place] Phys: Nelva Bush MD IMPRESSIONS  1. Left ventricular ejection fraction, by estimation, is 55 to 60%. The left ventricle has normal function. The left ventricle demonstrates regional wall motion abnormalities (see scoring diagram/findings for description).  Left ventricular diastolic parameters are consistent with Grade II diastolic dysfunction (pseudonormalization). Elevated left atrial pressure. There is akinesis of the left ventricular, basal inferior segment. There is mild hypokinesis of the left ventricular, mid inferior segment.  2. Right ventricular systolic function is normal. The right ventricular size is normal. There is normal pulmonary artery systolic pressure.  3. Left atrial size was mildly dilated.  4. The mitral valve is normal in structure. Trivial mitral valve regurgitation. No evidence of mitral stenosis.  5. The aortic valve is tricuspid. Aortic valve regurgitation is not visualized. No aortic stenosis is present.  6. The inferior vena cava is normal in size with <50% respiratory variability, suggesting right atrial pressure of 8 mmHg. FINDINGS  Left Ventricle: Left ventricular ejection fraction, by estimation, is 55 to 60%. The left ventricle has normal function. The left ventricle demonstrates regional wall motion abnormalities. Mild hypokinesis of the left ventricular, mid inferior segment. The left ventricular internal cavity size was normal in size. There is borderline left ventricular hypertrophy. Left ventricular diastolic parameters are consistent with Grade II diastolic dysfunction (pseudonormalization). Elevated left atrial pressure. Right Ventricle: The right ventricular size is normal. No increase in right ventricular wall thickness. Right ventricular systolic function is normal. There is normal pulmonary artery systolic pressure. The tricuspid regurgitant velocity is 2.39 m/s, and  with an assumed right atrial pressure of 8 mmHg, the estimated right ventricular systolic pressure is 74.2 mmHg. Left Atrium: Left atrial size was mildly dilated. Right Atrium: Right atrial size was normal in size. Pericardium: There is no evidence of pericardial effusion. Mitral Valve: The mitral valve is normal in structure. Mild mitral annular  calcification. Trivial mitral valve regurgitation. No evidence of mitral valve stenosis. Tricuspid Valve: The tricuspid valve is grossly normal. Tricuspid valve regurgitation is mild. Aortic Valve: The aortic valve is tricuspid. Aortic valve regurgitation is not visualized. No aortic stenosis is present. Aortic valve peak gradient measures 4.6 mmHg. Pulmonic Valve: The pulmonic valve was not well visualized. Pulmonic valve regurgitation is trivial. No evidence of pulmonic stenosis. Aorta: The aortic root is normal in size and structure. Pulmonary Artery: The pulmonary artery is not well seen. Venous: The inferior vena cava is normal in size with less than 50% respiratory variability, suggesting right atrial pressure of 8 mmHg. IAS/Shunts: The interatrial septum was not well visualized. LEFT VENTRICLE PLAX 2D LVIDd:         3.82 cm  Diastology LVIDs:         2.86 cm  LV e' medial:    5.87 cm/s LV PW:         0.91 cm  LV E/e' medial:  21.1 LV IVS:        1.05 cm  LV e' lateral:  7.83 cm/s LVOT diam:     1.80 cm  LV E/e' lateral: 15.8 LVOT Area:     2.54 cm  RIGHT VENTRICLE RV Mid diam:    2.96 cm RV S prime:     14.30 cm/s TAPSE (M-mode): 2.2 cm LEFT ATRIUM             Index       RIGHT ATRIUM           Index LA diam:        3.50 cm 2.14 cm/m  RA Area:     11.50 cm LA Vol (A2C):   66.0 ml 40.34 ml/m RA Volume:   26.40 ml  16.14 ml/m LA Vol (A4C):   44.2 ml 27.01 ml/m LA Biplane Vol: 54.9 ml 33.55 ml/m  AORTIC VALVE                PULMONIC VALVE AV Area (Vmax): 2.11 cm    PV Vmax:        0.81 m/s AV Vmax:        107.00 cm/s PV Peak grad:   2.7 mmHg AV Peak Grad:   4.6 mmHg    RVOT Peak grad: 1 mmHg LVOT Vmax:      88.80 cm/s  AORTA Ao Root diam: 2.50 cm MITRAL VALVE                TRICUSPID VALVE MV Area (PHT): 5.88 cm     TR Peak grad:   22.8 mmHg MV Decel Time: 129 msec     TR Vmax:        239.00 cm/s MV E velocity: 124.00 cm/s MV A velocity: 101.00 cm/s  SHUNTS MV E/A ratio:  1.23         Systemic Diam:  1.80 cm MV A Prime:    8.0 cm/s Nelva Bush MD Electronically signed by Nelva Bush MD Signature Date/Time: 12/02/2019/6:51:23 AM    Final     Microbiology: Recent Results (from the past 240 hour(s))  Resp Panel by RT-PCR (Flu A&B, Covid) Nasopharyngeal Swab     Status: None   Collection Time: 11/30/19  4:13 PM   Specimen: Nasopharyngeal Swab; Nasopharyngeal(NP) swabs in vial transport medium  Result Value Ref Range Status   SARS Coronavirus 2 by RT PCR NEGATIVE NEGATIVE Final    Comment: (NOTE) SARS-CoV-2 target nucleic acids are NOT DETECTED.  The SARS-CoV-2 RNA is generally detectable in upper respiratory specimens during the acute phase of infection. The lowest concentration of SARS-CoV-2 viral copies this assay can detect is 138 copies/mL. A negative result does not preclude SARS-Cov-2 infection and should not be used as the sole basis for treatment or other patient management decisions. A negative result may occur with  improper specimen collection/handling, submission of specimen other than nasopharyngeal swab, presence of viral mutation(s) within the areas targeted by this assay, and inadequate number of viral copies(<138 copies/mL). A negative result must be combined with clinical observations, patient history, and epidemiological information. The expected result is Negative.  Fact Sheet for Patients:  EntrepreneurPulse.com.au  Fact Sheet for Healthcare Providers:  IncredibleEmployment.be  This test is no t yet approved or cleared by the [place] FDA and  has been authorized for detection and/or diagnosis of SARS-CoV-2 by FDA under an Emergency Use Authorization (EUA). This EUA will remain  in effect (meaning this test can be used) for the duration of the COVID-19 declaration under Section 564(b)(1) of the Act, 21 U.S.C.section 360bbb-3(b)(1), unless the  authorization is terminated  or revoked sooner.       Influenza A  by PCR NEGATIVE NEGATIVE Final   Influenza B by PCR NEGATIVE NEGATIVE Final    Comment: (NOTE) The Xpert Xpress SARS-CoV-2/FLU/RSV plus assay is intended as an aid in the diagnosis of influenza from Nasopharyngeal swab specimens and should not be used as a sole basis for treatment. Nasal washings and aspirates are unacceptable for Xpert Xpress SARS-CoV-2/FLU/RSV testing.  Fact Sheet for Patients: EntrepreneurPulse.com.au  Fact Sheet for Healthcare Providers: IncredibleEmployment.be  This test is not yet approved or cleared by the [place] FDA and has been authorized for detection and/or diagnosis of SARS-CoV-2 by FDA under an Emergency Use Authorization (EUA). This EUA will remain in effect (meaning this test can be used) for the duration of the COVID-19 declaration under Section 564(b)(1) of the Act, 21 U.S.C. section 360bbb-3(b)(1), unless the authorization is terminated or revoked.  Performed at [facility], [address] [place], [place] [address]      Labs: Basic Metabolic Panel: Recent Labs  Lab 11/30/19 1254 12/01/19 0405 12/02/19 1107 12/03/19 0437  NA 136 139 133* 136  K 4.1 4.2 3.7 3.8  CL 98 102 99 102  CO2 27 26 27 28   GLUCOSE 122* 102* 130* 94  BUN 9 9 16 13   CREATININE 0.79 0.68 0.85 0.83  CALCIUM 9.1 8.1* 8.3* 8.1*  MG  --   --  2.1  --    Liver Function Tests: No results for input(s): AST, ALT, ALKPHOS, BILITOT, PROT, ALBUMIN in the last 168 hours. No results for input(s): LIPASE, AMYLASE in the last 168 hours. No results for input(s): AMMONIA in the last 168 hours. CBC: Recent Labs  Lab 11/30/19 1254 12/01/19 0405 12/02/19 0235 12/03/19 0437  WBC 5.8 8.4 10.4 8.7  HGB 14.0 12.1* 11.9* 12.6*  HCT 43.0 36.9* 36.1* 37.2*  MCV 95.3 95.6 93.5 92.3  PLT 259 204 191 191   Cardiac Enzymes: No results for input(s): CKTOTAL, CKMB, CKMBINDEX, TROPONINI in the last 168 hours. BNP: BNP (last 3  results) No results for input(s): BNP in the last 8760 hours.  ProBNP (last 3 results) No results for input(s): PROBNP in the last 8760 hours.  CBG: No results for input(s): GLUCAP in the last 168 hours.     Signed:  Nita Sells MD   [facility] 12/03/2019, 9:42 AM

## 2019-12-03 NOTE — Telephone Encounter (Signed)
Patient would like to wait to schedule at this time Will call when out of hospital

## 2019-12-03 NOTE — Progress Notes (Signed)
Mobility Specialist - Progress Note   12/03/19 1234  Mobility  Activity Ambulated in hall  Level of [place] wheel walker  Distance Ambulated (ft) 750 ft  Mobility Response Tolerated well  Mobility performed by Mobility specialist  $Mobility charge 1 Mobility    During mobility: 60 HR, 97% SpO2 Post-mobility: 103 HR, 99% SpO2   Pt was beginning to ambulate in hallway when he stopped to talk to mobility specialist. Pt agreed to let mobility specialist join in for a session. VS were checked: 60 HR, 97% SpO2. Noticed pt's RW needing height adjustment to meet pt's needs, mobility resolved issue. Pt proceeded to ambulate ~750' in hallway with no c/o SOB, dizziness, or weakness in extremities. Pt stated that he did not use RW at baseline. Pt returned to room, sitting RW aside, and progressed to ambulation towards bathroom and then bedside without AD. Pt is independent in all transfers. Overall, pt tolerated session well. Noted pt's HR increased from 60 bpm to 103 bpm during activity. No c/o chest pain, nausea, or fatigue. Nurse notified.    Kathee Delton Mobility Specialist 12/03/19, 12:43 PM

## 2019-12-03 NOTE — Telephone Encounter (Signed)
-----   Message from Marykay Lex sent at 12/03/2019 10:29 AM EST ----- Regarding: TCM LVM for patient to callback and schedule ----- Message ----- From: Rise Mu, PA-C Sent: 12/03/2019   8:15 AM EST To: [facility] Scheduling  Please schedule patient for TCM follow up in 1-2 weeks. Thanks!

## 2019-12-03 NOTE — Progress Notes (Signed)
Progress Note  Patient Name: Brent [name] Date of Encounter: 12/03/2019  Primary Cardiologist: New to [facility] - consult by Arida  Subjective   No further chest pain. No further NSVT on telemetry. Has tolerated addition of Toprol XL without issues with stable BP.   Inpatient Medications    Scheduled Meds: . aspirin  81 mg Oral Daily  . atorvastatin  80 mg Oral Daily  . clopidogrel  75 mg Oral Q breakfast  . feeding supplement  237 mL Oral BID BM  . metoprolol succinate  12.5 mg Oral Daily  . multivitamin with minerals  1 tablet Oral Daily  . sodium chloride flush  3 mL Intravenous Q12H   Continuous Infusions: . sodium chloride    . heparin 750 Units/hr (12/03/19 6378)   PRN Meds: sodium chloride, acetaminophen, albuterol, ondansetron (ZOFRAN) IV, sodium chloride flush   Vital Signs    Vitals:   12/02/19 1623 12/02/19 1957 12/03/19 0437 12/03/19 0740  BP: 106/67 109/60 (!) 107/59 (!) 114/58  Pulse: 85 84 78 80  Resp: 19     Temp: 98.7 F (37.1 C) 99 F (37.2 C) 99 F (37.2 C) 98.2 F (36.8 C)  TempSrc: Oral Oral Oral Oral  SpO2: 100% 97% 99% 100%  Weight:   54.7 kg   Height:        Intake/Output Summary (Last 24 hours) at 12/03/2019 0807 Last data filed at 12/03/2019 0742 Gross per 24 hour  Intake 600 ml  Output 1125 ml  Net -525 ml   Filed Weights   12/01/19 0300 12/02/19 0500 12/03/19 0437  Weight: 52.4 kg 54.5 kg 54.7 kg    Telemetry    SR with sinus arrhythmia - Personally Reviewed  ECG    No new tracings - Personally Reviewed  Physical Exam   GEN: No acute distress. Frail appearing.   Neck: No JVD. Cardiac: RRR, no murmurs, rubs, or gallops. Right radial cardiac cath site is is well healing without bleeding, bruising, swelling, warmth, or erythema. Radial pulse 2+. Respiratory: Clear to auscultation bilaterally.  GI: Soft, nontender, non-distended.   MS: No edema; No deformity. Neuro:  Alert and oriented x 3; Nonfocal.  Psych:  Normal affect.  Labs    Chemistry Recent Labs  Lab 12/01/19 0405 12/02/19 1107 12/03/19 0437  NA 139 133* 136  K 4.2 3.7 3.8  CL 102 99 102  CO2 26 27 28   GLUCOSE 102* 130* 94  BUN 9 16 13   CREATININE 0.68 0.85 0.83  CALCIUM 8.1* 8.3* 8.1*  GFRNONAA >60 >60 >60  ANIONGAP 11 7 6      Hematology Recent Labs  Lab 12/01/19 0405 12/02/19 0235 12/03/19 0437  WBC 8.4 10.4 8.7  RBC 3.86* 3.86* 4.03*  HGB 12.1* 11.9* 12.6*  HCT 36.9* 36.1* 37.2*  MCV 95.6 93.5 92.3  MCH 31.3 30.8 31.3  MCHC 32.8 33.0 33.9  RDW 12.7 12.4 12.4  PLT 204 191 191    Cardiac EnzymesNo results for input(s): TROPONINI in the last 168 hours. No results for input(s): TROPIPOC in the last 168 hours.   BNPNo results for input(s): BNP, PROBNP in the last 168 hours.   DDimer No results for input(s): DDIMER in the last 168 hours.   Radiology    DG Chest 2 View  Result Date: 11/30/2019 IMPRESSION: No active cardiopulmonary disease. Electronically Signed   By: Davina Poke D.O.   On: 11/30/2019 13:53    CT Angio Chest/Abd/Pel for Dissection W and/or  W/WO  Result Date: 11/30/2019 IMPRESSION: 1. No evidence of thoracic or abdominal aortic dissection or aneurysm. 2. No acute findings in the chest, abdomen or pelvis. 3. Cholelithiasis without acute inflammation. 4. Enlarged prostate gland. Aortic Atherosclerosis (ICD10-I70.0). Electronically Signed   By: Constance Holster M.D.   On: 11/30/2019 23:16    Cardiac Studies   LHC 12/01/2019:  The left ventricular systolic function is normal.  LV end diastolic pressure is moderately elevated.  The left ventricular ejection fraction is 55-65% by visual estimate.  Prox RCA lesion is 90% stenosed.  Prox LAD to Mid LAD lesion is 40% stenosed.  Mid LAD lesion is 20% stenosed.  1st Diag lesion is 70% stenosed.  Ramus lesion is 60% stenosed.  Mid Cx to Dist Cx lesion is 70% stenosed.  Mid LM to Dist LM lesion is 30% stenosed.   1.  Borderline significant one-vessel coronary artery disease affecting the mid left circumflex. The coronary arteries are heavily calcified especially in the proximal segment with no clear culprit for non-ST elevation myocardial infarction. 2. Normal LV systolic function mildly moderately elevated left ventricular end-diastolic pressure at 22 mmHg.  Recommendations: I do not see a clear culprit for non-ST elevation myocardial infarction. Exact etiology for this degree of troponin elevation is still not entirely clear but could be due to an occluded vessel with subsequent recanalization. I am going to add Plavix in addition to aspirin to be used for at least a year. I increase atorvastatin to 80 mg. We will obtain an echocardiogram to evaluate RV function and make sure there is no other etiology for troponin elevation. ___________  2D echo 11/22/201: 1. Left ventricular ejection fraction, by estimation, is 55 to 60%. The  left ventricle has normal function. The left ventricle demonstrates  regional wall motion abnormalities (see scoring diagram/findings for  description). Left ventricular diastolic  parameters are consistent with Grade II diastolic dysfunction  (pseudonormalization). Elevated left atrial pressure. There is akinesis of  the left ventricular, basal inferior segment. There is mild hypokinesis of  the left ventricular, mid inferior  segment.  2. Right ventricular systolic function is normal. The right ventricular  size is normal. There is normal pulmonary artery systolic pressure.  3. Left atrial size was mildly dilated.  4. The mitral valve is normal in structure. Trivial mitral valve  regurgitation. No evidence of mitral stenosis.  5. The aortic valve is tricuspid. Aortic valve regurgitation is not  visualized. No aortic stenosis is present.  6. The inferior vena cava is normal in size with <50% respiratory  variability, suggesting right atrial pressure of 8  mmHg.  Patient Profile     74 y.o. male with history of CAD,Giillain-Barre syndrome, HLD, and tobacco use who was admitted with a NSTEMI.    Assessment & Plan    1. NSTEMI:  -LHC 11/22 with borderline significant 1-vessel CAD affecting the mid LCx with the coronary arteries being heavily calcified, particularly in the proximal segments. Culprit lesion felt to be a small occluded AV groove LCx. Medical management was advised. Echo showed an EF of 55-60%, akinesis of the basal inferior segment with mild hypokinesis of the mid inferior segment, Gr2DD, normal RVSF, ventricular cavity size, PASP, mildly dilated left atrium, trivial mitral regurgitation, and a right atrial pressure of 8.  -Chest pain free -DAPT with ASA and Plavix for the next 12 months -Ok to stop heparin gtt this morning as he has completed > 48 hours of therapy -Lipitor  -Metoprolol  -Relative  hypotension precludes addition of ACEi/ARB at this time [facility] repeat LHC for refractory angina despite maximally tolerated antianginal therapy  -Post cath instructions   2. NSVT: -26 beats of NSVT on 11/23 at 09:57  -He was asymptomatic  -Potassium and magnesium normal -TSH normal this admission -Tolerating low dose Toprol XL 12.5 mg daily  3. HLD: -LDL 57 -Lipitor  4. Tobacco use: -Complete cessation advised    -Dispo: -Ok to discharge from a cardiac perspective once he has been staffed by Dr. Garen Lah on current cardiac medications. I will arrange for follow up in our office in 1-2 weeks.    For questions or updates, please contact [place] Please consult [email or web address] for contact info under Cardiology/STEMI.    Signed, Christell Faith, PA-C [place] Pager: [phone] 12/03/2019, 8:07 AM

## 2019-12-03 NOTE — Care Management Important Message (Signed)
Important Message  Patient Details  Name: Brent [name] MRN: [id number] Date of Birth: May 06, 1945   Medicare Important Message Given:  Yes     Dannette Barbara 12/03/2019, 11:43 AM

## 2019-12-03 NOTE — Plan of Care (Signed)
°  Problem: Education: Goal: Understanding of cardiac disease, CV risk reduction, and recovery process will improve Outcome: Adequate for Discharge Goal: Individualized Educational Video(s) Outcome: Adequate for Discharge   Problem: Activity: Goal: Ability to tolerate increased activity will improve Outcome: Adequate for Discharge   Problem: Cardiac: Goal: Ability to achieve and maintain adequate cardiovascular perfusion will improve Outcome: Adequate for Discharge   Problem: Health Behavior/Discharge Planning: Goal: Ability to safely manage health-related needs after discharge will improve Outcome: Adequate for Discharge   Problem: Education: Goal: Knowledge of General Education information will improve Description: Including pain rating scale, medication(s)/side effects and non-pharmacologic comfort measures Outcome: Adequate for Discharge   Problem: Health Behavior/Discharge Planning: Goal: Ability to manage health-related needs will improve Outcome: Adequate for Discharge   Problem: Clinical Measurements: Goal: Ability to maintain clinical measurements within normal limits will improve Outcome: Adequate for Discharge Goal: Will remain free from infection Outcome: Adequate for Discharge Goal: Diagnostic test results will improve Outcome: Adequate for Discharge Goal: Respiratory complications will improve Outcome: Adequate for Discharge Goal: Cardiovascular complication will be avoided Outcome: Adequate for Discharge   Problem: Activity: Goal: Risk for activity intolerance will decrease Outcome: Adequate for Discharge   Problem: Nutrition: Goal: Adequate nutrition will be maintained Outcome: Adequate for Discharge   Problem: Coping: Goal: Level of anxiety will decrease Outcome: Adequate for Discharge   Problem: Elimination: Goal: Will not experience complications related to bowel motility Outcome: Adequate for Discharge Goal: Will not experience complications  related to urinary retention Outcome: Adequate for Discharge   Problem: Pain Managment: Goal: General experience of comfort will improve Outcome: Adequate for Discharge   Problem: Safety: Goal: Ability to remain free from injury will improve Outcome: Adequate for Discharge   Problem: Skin Integrity: Goal: Risk for impaired skin integrity will decrease Outcome: Adequate for Discharge   

## 2019-12-03 NOTE — Progress Notes (Signed)
Patient discharged per orders. PIV and tele removed from patient. AVS discharge instructions provided to patient; expresses understanding. Patient to taken main lobby via wheelchair by volunteer services.

## 2019-12-08 NOTE — Telephone Encounter (Signed)
-----   Message from Marykay Lex sent at 12/03/2019 10:29 AM EST ----- Regarding: TCM LVM for patient to callback and schedule ----- Message ----- From: Rise Mu, PA-C Sent: 12/03/2019   8:15 AM EST To: [facility] Scheduling  Please schedule patient for TCM follow up in 1-2 weeks. Thanks!

## 2019-12-08 NOTE — Telephone Encounter (Signed)
Attempted to schedule.  LMOV to call office.  ° °

## 2019-12-08 NOTE — Telephone Encounter (Signed)
Patient contacted regarding discharge from [facility] on 12/03/19.  Patient understands to follow up with provider Christell Faith, PA on 12/18/19 at 3 pm at [facility] Patient understands discharge instructions? yes Patient understands medications and regiment? yes Patient understands to bring all medications to this visit? yes  Ask patient:  Are you enrolled in My Chart  YES, has MyChart

## 2019-12-08 NOTE — Telephone Encounter (Signed)
TCM....  Patient is  discharged      They are scheduled to see Thurmond Butts 12/9 at 3 pm .  Patient previously seen by [facility] and prefers to be seen here.  He is aware that he does not need to see both and will call [facility] to cancel upcoming appt    They need to be seen within 2 weeks

## 2019-12-12 ENCOUNTER — Encounter: Payer: Medicare PPO | Attending: Cardiovascular Disease | Admitting: *Deleted

## 2019-12-12 ENCOUNTER — Other Ambulatory Visit: Payer: Self-pay

## 2019-12-12 DIAGNOSIS — I214 Non-ST elevation (NSTEMI) myocardial infarction: Secondary | ICD-10-CM | POA: Insufficient documentation

## 2019-12-12 NOTE — Progress Notes (Signed)
Initial orientation completed. Diagnosis can be found in [facility] 11/21. EP orientation scheduled for Tuesday, 12/7 at 9am.

## 2019-12-16 ENCOUNTER — Encounter: Payer: Medicare PPO | Admitting: *Deleted

## 2019-12-16 ENCOUNTER — Other Ambulatory Visit: Payer: Self-pay

## 2019-12-16 VITALS — Ht 68.9 in | Wt 119.0 lb

## 2019-12-16 DIAGNOSIS — I214 Non-ST elevation (NSTEMI) myocardial infarction: Secondary | ICD-10-CM | POA: Diagnosis not present

## 2019-12-16 NOTE — Patient Instructions (Signed)
3Patient Instructions  Patient Details  Name: Brent [name] MRN: [id number] Date of Birth: 1945-02-03 Referring Provider:  Wellington Hampshire, MD  Below are your personal goals for exercise, nutrition, and risk factors. Our goal is to help you stay on track towards obtaining and maintaining these goals. We will be discussing your progress on these goals with you throughout the program.  Initial Exercise Prescription:  Initial Exercise Prescription - 12/16/19 1300      Date of Initial Exercise RX and Referring Provider   Date 12/16/19    Referring Provider [name] MD      Treadmill   MPH 2    Grade 0.5    Minutes 15    METs 2.67      Recumbant Bike   Level 2    RPM 50    Watts 22    Minutes 15    METs 2.5      NuStep   Level 2    SPM 80    Minutes 15    METs 2.5      Recumbant Elliptical   Level 1    RPM 50    Minutes 15    METs 2.5      Prescription Details   Frequency (times per week) 2    Duration Progress to 30 minutes of continuous aerobic without signs/symptoms of physical distress      Intensity   THRR 40-80% of Max Heartrate 112-135    Ratings of Perceived Exertion 11-13    Perceived Dyspnea 0-4      Progression   Progression Continue to progress workloads to maintain intensity without signs/symptoms of physical distress.      Resistance Training   Training Prescription Yes    Weight 3 lb    Reps 10-15           Exercise Goals: Frequency: Be able to perform aerobic exercise two to three times per week in program working toward 2-5 days per week of home exercise.  Intensity: Work with a perceived exertion of 11 (fairly light) - 15 (hard) while following your exercise prescription.  We will make changes to your prescription with you as you progress through the program.   Duration: Be able to do 30 to 45 minutes of continuous aerobic exercise in addition to a 5 minute warm-up and a 5 minute cool-down routine.   Nutrition Goals: Your  personal nutrition goals will be established when you do your nutrition analysis with the dietician.  The following are general nutrition guidelines to follow: Cholesterol < 200mg /day Sodium < 1500mg /day Fiber: Men over 50 yrs - 30 grams per day  Personal Goals:  Personal Goals and Risk Factors at Admission - 12/16/19 1409      Core Components/Risk Factors/Patient Goals on Admission    Weight Management Yes;Weight Gain    Intervention Weight Management: Develop a combined nutrition and exercise program designed to reach desired caloric intake, while maintaining appropriate intake of nutrient and fiber, sodium and fats, and appropriate energy expenditure required for the weight goal.;Weight Management: Provide education and appropriate resources to help participant work on and attain dietary goals.    Admit Weight 119 lb (54 kg)    Goal Weight: Short Term 122 lb (55.3 kg)    Goal Weight: Long Term 125 lb (56.7 kg)    Expected Outcomes Short Term: Continue to assess and modify interventions until short term weight is achieved;Long Term: Adherence to nutrition and physical activity/exercise program aimed toward  attainment of established weight goal;Weight Gain: Understanding of general recommendations for a high calorie, high protein meal plan that promotes weight gain by distributing calorie intake throughout the day with the consumption for 4-5 meals, snacks, and/or supplements    Tobacco Cessation Yes    Intervention Assist the participant in steps to quit. Provide individualized education and counseling about committing to Tobacco Cessation, relapse prevention, and pharmacological support that can be provided by physician.;Advice worker, assist with locating and accessing local/national Quit Smoking programs, and support quit date choice.    Expected Outcomes Short Term: Will quit all tobacco product use, adhering to prevention of relapse plan.;Long Term: Complete abstinence from  all tobacco products for at least 12 months from quit date.    Hypertension Yes    Intervention Provide education on lifestyle modifcations including regular physical activity/exercise, weight management, moderate sodium restriction and increased consumption of fresh fruit, vegetables, and low fat dairy, alcohol moderation, and smoking cessation.;Monitor prescription use compliance.    Expected Outcomes Long Term: Maintenance of blood pressure at goal levels.;Short Term: Continued assessment and intervention until BP is < 140/1mm HG in hypertensive participants. < 130/48mm HG in hypertensive participants with diabetes, heart failure or chronic kidney disease.    Lipids Yes    Intervention Provide education and support for participant on nutrition & aerobic/resistive exercise along with prescribed medications to achieve LDL 70mg , HDL >40mg .    Expected Outcomes Short Term: Participant states understanding of desired cholesterol values and is compliant with medications prescribed. Participant is following exercise prescription and nutrition guidelines.;Long Term: Cholesterol controlled with medications as prescribed, with individualized exercise RX and with personalized nutrition plan. Value goals: LDL < 70mg , HDL > 40 mg.           Tobacco Use Initial Evaluation: Social History   Tobacco Use  Smoking Status Former Smoker  . Packs/day: 1.00  . Years: 56.00  . Pack years: 56.00  . Types: Cigarettes  . Start date: 1964  . Quit date: 11/30/2019  . Years since quitting: 0.0  Smokeless Tobacco Never Used  Tobacco Comment   Quit when he was admitted in the hospital 11/30/19    Exercise Goals and Review:  Exercise Goals    Row Name 12/16/19 1405             Exercise Goals   Increase Physical Activity Yes       Intervention Provide advice, education, support and counseling about physical activity/exercise needs.;Develop an individualized exercise prescription for aerobic and resistive  training based on initial evaluation findings, risk stratification, comorbidities and participant's personal goals.       Expected Outcomes Short Term: Attend rehab on a regular basis to increase amount of physical activity.;Long Term: Add in home exercise to make exercise part of routine and to increase amount of physical activity.;Long Term: Exercising regularly at least 3-5 days a week.       Increase Strength and Stamina Yes       Intervention Provide advice, education, support and counseling about physical activity/exercise needs.;Develop an individualized exercise prescription for aerobic and resistive training based on initial evaluation findings, risk stratification, comorbidities and participant's personal goals.       Expected Outcomes Short Term: Increase workloads from initial exercise prescription for resistance, speed, and METs.;Short Term: Perform resistance training exercises routinely during rehab and add in resistance training at home;Long Term: Improve cardiorespiratory fitness, muscular endurance and strength as measured by increased METs and functional capacity (6MWT)  Able to understand and use rate of perceived exertion (RPE) scale Yes       Intervention Provide education and explanation on how to use RPE scale       Expected Outcomes Short Term: Able to use RPE daily in rehab to express subjective intensity level;Long Term:  Able to use RPE to guide intensity level when exercising independently       Able to understand and use Dyspnea scale Yes       Intervention Provide education and explanation on how to use Dyspnea scale       Expected Outcomes Short Term: Able to use Dyspnea scale daily in rehab to express subjective sense of shortness of breath during exertion;Long Term: Able to use Dyspnea scale to guide intensity level when exercising independently       Knowledge and understanding of Target Heart Rate Range (THRR) Yes       Intervention Provide education and  explanation of THRR including how the numbers were predicted and where they are located for reference       Expected Outcomes Short Term: Able to state/look up THRR;Short Term: Able to use daily as guideline for intensity in rehab;Long Term: Able to use THRR to govern intensity when exercising independently       Able to check pulse independently Yes       Intervention Provide education and demonstration on how to check pulse in carotid and radial arteries.;Review the importance of being able to check your own pulse for safety during independent exercise       Expected Outcomes Short Term: Able to explain why pulse checking is important during independent exercise;Long Term: Able to check pulse independently and accurately       Understanding of Exercise Prescription Yes       Intervention Provide education, explanation, and written materials on patient's individual exercise prescription       Expected Outcomes Short Term: Able to explain program exercise prescription;Long Term: Able to explain home exercise prescription to exercise independently              Copy of goals given to participant.

## 2019-12-16 NOTE — Progress Notes (Signed)
Cardiac Individual Treatment Plan  Patient Details  Name: Brent [name] MRN: [id number] Date of Birth: 1945-09-30 Referring Provider:     Cardiac Rehab from 12/16/2019 in [facility] Cardiac and Pulmonary Rehab  Referring Provider [name] MD      Initial Encounter Date:    Cardiac Rehab from 12/16/2019 in [facility] Cardiac and Pulmonary Rehab  Date 12/16/19      Visit Diagnosis: NSTEMI (non-ST elevated myocardial infarction) [facility])  Patient's Home Medications on Admission:  Current Outpatient Medications:  .  acetaminophen (TYLENOL) 325 MG tablet, Take 650 mg by mouth every 6 (six) hours as needed. , Disp: , Rfl:  .  albuterol (VENTOLIN HFA) 108 (90 Base) MCG/ACT inhaler, Inhale 2 puffs into the lungs every 6 (six) hours as needed for wheezing or shortness of breath., Disp: 6.7 g, Rfl: 2 .  aspirin EC 81 MG EC tablet, Take 1 tablet (81 mg total) by mouth daily., Disp: , Rfl:  .  atorvastatin (LIPITOR) 80 MG tablet, Take 1 tablet (80 mg total) by mouth daily., Disp: 30 tablet, Rfl: 2 .  clopidogrel (PLAVIX) 75 MG tablet, Take 1 tablet (75 mg total) by mouth daily with breakfast., Disp: 30 tablet, Rfl: 11 .  gabapentin (NEURONTIN) 100 MG capsule, TAKE ONE CAPSULE BY MOUTH DAILY. ONLY AS NEEDED (Patient taking differently: Take 100 mg by mouth daily. ), Disp: 90 capsule, Rfl: 0 .  metoprolol succinate (TOPROL-XL) 25 MG 24 hr tablet, Take 0.5 tablets (12.5 mg total) by mouth daily., Disp: 30 tablet, Rfl: 11  Past Medical History: Past Medical History:  Diagnosis Date  . Aortic calcification (HCC) 07/2015   Noted on chest CT with contrast  . Cataract   . Centrilobular emphysema ([place]) 08/03/2015   Noted on chest CT July 2017  . 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[place]) 07/29/2015  . Coronary artery calcification seen on CAT scan 07/29/2015   Noted on CT scan July 2017  . 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 Guillain Barr syndrome ([place])   . History of colonic polyps    . Hyperlipidemia   . Hypochloremia   . Osteopenia 08/29/2016   August 2018; next scan August 2020  . Right Supraclavicular Lymph node 08/03/2015  . Solitary pulmonary nodule 08/03/2015   CT Chest 07/2015 - Subpleural 3 mm left upper lobe pulmonary nodule, probably benign.  . Tobacco abuse     Tobacco Use: Social History   Tobacco Use  Smoking Status Former Smoker  . Packs/day: 1.00  . Years: 56.00  . Pack years: 56.00  . Types: Cigarettes  . Start date: 1964  . Quit date: 11/30/2019  . Years since quitting: 0.0  Smokeless Tobacco Never Used  Tobacco Comment   Quit when he was admitted in the hospital 11/30/19    Labs: Recent Review Flowsheet Data    Labs for ITP Cardiac and Pulmonary Rehab Latest Ref Rng & Units 03/27/2016 05/24/2017 10/28/2018 10/29/2019 12/01/2019   Cholestrol 0 - 200 mg/dL 162 164 187 140 111   LDLCALC 0 - 99 mg/dL 87 94 109(H) 61 57   HDL >40 mg/dL 48 51 59 65 49   Trlycerides <150 mg/dL 134 95 97 59 26   Hemoglobin A1c 4.2 - 6.3 % - - - - -       Exercise Target Goals: Exercise Program Goal: Individual exercise prescription set using results from initial 6 min walk test and THRR while considering  patient's activity barriers and safety.  Exercise Prescription Goal: Initial exercise prescription builds to 30-45 minutes a day of aerobic activity, 2-3 days per week.  Home exercise guidelines will be given to patient during program as part of exercise prescription that the participant will acknowledge.   Education: Aerobic Exercise: - Group verbal and visual presentation on the components of exercise prescription. Introduces F.I.T.T principle from ACSM for exercise prescriptions.  Reviews F.I.T.T. principles of aerobic exercise including progression. Written material given at graduation.   Cardiac Rehab from 12/16/2019 in [facility] Cardiac and Pulmonary Rehab  Education need identified 12/16/19      Education: Resistance Exercise: - Group verbal and  visual presentation on the components of exercise prescription. Introduces F.I.T.T principle from ACSM for exercise prescriptions  Reviews F.I.T.T. principles of resistance exercise including progression. Written material given at graduation.   Cardiac Rehab from 12/16/2019 in [facility] Cardiac and Pulmonary Rehab  Education need identified 12/16/19       Education: Exercise & Equipment Safety: - Individual verbal instruction and demonstration of equipment use and safety with use of the equipment.   Cardiac Rehab from 12/16/2019 in [facility] Cardiac and Pulmonary Rehab  Date 12/16/19  Educator [facility]  Instruction Review Code 1- Verbalizes Understanding      Education: Exercise Physiology & General Exercise Guidelines: - Group verbal and written instruction with models to review the exercise physiology of the cardiovascular system and associated critical values. Provides general exercise guidelines with specific guidelines to those with heart or lung disease.    Education: Flexibility, Balance, Mind/Body Relaxation: - Group verbal and visual presentation with interactive activity on the components of exercise prescription. Introduces F.I.T.T principle from ACSM for exercise prescriptions. Reviews F.I.T.T. principles of flexibility and balance exercise training including progression. Also discusses the mind body connection.  Reviews various relaxation techniques to help reduce and manage stress (i.e. Deep breathing, progressive muscle relaxation, and visualization). Balance handout provided to take home. Written material given at graduation.   Activity Barriers & Risk Stratification:  Activity Barriers & Cardiac Risk Stratification - 12/16/19 1300      Activity Barriers & Cardiac Risk Stratification   Activity Barriers Back Problems;Deconditioning;Muscular Weakness;Shortness of Breath;Balance Concerns    Cardiac Risk Stratification High           6 Minute Walk:  6 Minute Walk    Row Name 12/16/19  1300         6 Minute Walk   Phase Initial     Distance 1120 feet     Walk Time 6 minutes     # of Rest Breaks 0     MPH 2.12     METS 3.29     RPE 12     Perceived Dyspnea  1     VO2 Peak 11.5     Symptoms Yes (comment)     Comments SOB     Resting HR 89 bpm     Resting BP 150/74     Resting Oxygen Saturation  97 %     Exercise Oxygen Saturation  during 6 min walk 98 %     Max Ex. HR 105 bpm     Max Ex. BP 154/62     2 Minute Post BP 132/74            Oxygen Initial Assessment:   Oxygen Re-Evaluation:   Oxygen Discharge (Final Oxygen Re-Evaluation):   Initial Exercise Prescription:  Initial Exercise Prescription - 12/16/19 1300      Date of Initial Exercise RX  and Referring Provider   Date 12/16/19    Referring Provider [name] MD      Treadmill   MPH 2    Grade 0.5    Minutes 15    METs 2.67      Recumbant Bike   Level 2    RPM 50    Watts 22    Minutes 15    METs 2.5      NuStep   Level 2    SPM 80    Minutes 15    METs 2.5      Recumbant Elliptical   Level 1    RPM 50    Minutes 15    METs 2.5      Prescription Details   Frequency (times per week) 2    Duration Progress to 30 minutes of continuous aerobic without signs/symptoms of physical distress      Intensity   THRR 40-80% of Max Heartrate 112-135    Ratings of Perceived Exertion 11-13    Perceived Dyspnea 0-4      Progression   Progression Continue to progress workloads to maintain intensity without signs/symptoms of physical distress.      Resistance Training   Training Prescription Yes    Weight 3 lb    Reps 10-15           Perform Capillary Blood Glucose checks as needed.  Exercise Prescription Changes:  Exercise Prescription Changes    Row Name 12/16/19 1300             Response to Exercise   Blood Pressure (Admit) 150/74       Blood Pressure (Exercise) 154/62       Blood Pressure (Exit) 132/74       Heart Rate (Admit) 89 bpm       Heart Rate  (Exercise) 105 bpm       Heart Rate (Exit) 90 bpm       Oxygen Saturation (Admit) 97 %       Oxygen Saturation (Exercise) 98 %       Rating of Perceived Exertion (Exercise) 12       Perceived Dyspnea (Exercise) 1       Symptoms SOB       Comments walk test results              Exercise Comments:   Exercise Goals and Review:  Exercise Goals    Row Name 12/16/19 1405             Exercise Goals   Increase Physical Activity Yes       Intervention Provide advice, education, support and counseling about physical activity/exercise needs.;Develop an individualized exercise prescription for aerobic and resistive training based on initial evaluation findings, risk stratification, comorbidities and participant's personal goals.       Expected Outcomes Short Term: Attend rehab on a regular basis to increase amount of physical activity.;Long Term: Add in home exercise to make exercise part of routine and to increase amount of physical activity.;Long Term: Exercising regularly at least 3-5 days a week.       Increase Strength and Stamina Yes       Intervention Provide advice, education, support and counseling about physical activity/exercise needs.;Develop an individualized exercise prescription for aerobic and resistive training based on initial evaluation findings, risk stratification, comorbidities and participant's personal goals.       Expected Outcomes Short Term: Increase workloads from initial exercise prescription for resistance, speed, and  METs.;Short Term: Perform resistance training exercises routinely during rehab and add in resistance training at home;Long Term: Improve cardiorespiratory fitness, muscular endurance and strength as measured by increased METs and functional capacity (6MWT)       Able to understand and use rate of perceived exertion (RPE) scale Yes       Intervention Provide education and explanation on how to use RPE scale       Expected Outcomes Short Term: Able to  use RPE daily in rehab to express subjective intensity level;Long Term:  Able to use RPE to guide intensity level when exercising independently       Able to understand and use Dyspnea scale Yes       Intervention Provide education and explanation on how to use Dyspnea scale       Expected Outcomes Short Term: Able to use Dyspnea scale daily in rehab to express subjective sense of shortness of breath during exertion;Long Term: Able to use Dyspnea scale to guide intensity level when exercising independently       Knowledge and understanding of Target Heart Rate Range (THRR) Yes       Intervention Provide education and explanation of THRR including how the numbers were predicted and where they are located for reference       Expected Outcomes Short Term: Able to state/look up THRR;Short Term: Able to use daily as guideline for intensity in rehab;Long Term: Able to use THRR to govern intensity when exercising independently       Able to check pulse independently Yes       Intervention Provide education and demonstration on how to check pulse in carotid and radial arteries.;Review the importance of being able to check your own pulse for safety during independent exercise       Expected Outcomes Short Term: Able to explain why pulse checking is important during independent exercise;Long Term: Able to check pulse independently and accurately       Understanding of Exercise Prescription Yes       Intervention Provide education, explanation, and written materials on patient's individual exercise prescription       Expected Outcomes Short Term: Able to explain program exercise prescription;Long Term: Able to explain home exercise prescription to exercise independently              Exercise Goals Re-Evaluation :   Discharge Exercise Prescription (Final Exercise Prescription Changes):  Exercise Prescription Changes - 12/16/19 1300      Response to Exercise   Blood Pressure (Admit) 150/74    Blood  Pressure (Exercise) 154/62    Blood Pressure (Exit) 132/74    Heart Rate (Admit) 89 bpm    Heart Rate (Exercise) 105 bpm    Heart Rate (Exit) 90 bpm    Oxygen Saturation (Admit) 97 %    Oxygen Saturation (Exercise) 98 %    Rating of Perceived Exertion (Exercise) 12    Perceived Dyspnea (Exercise) 1    Symptoms SOB    Comments walk test results           Nutrition:  Target Goals: Understanding of nutrition guidelines, daily intake of sodium 1500mg , cholesterol 200mg , calories 30% from fat and 7% or less from saturated fats, daily to have 5 or more servings of fruits and vegetables.  Education: All About Nutrition: -Group instruction provided by verbal, written material, interactive activities, discussions, models, and posters to present general guidelines for heart healthy nutrition including fat, fiber, MyPlate, the role of sodium in heart  healthy nutrition, utilization of the nutrition label, and utilization of this knowledge for meal planning. Follow up email sent as well. Written material given at graduation.   Biometrics:  Pre Biometrics - 12/16/19 1406      Pre Biometrics   Height 5' 8.9" (1.75 m)    Weight 119 lb (54 kg)    BMI (Calculated) 17.63    Single Leg Stand 8.7 seconds            Nutrition Therapy Plan and Nutrition Goals:   Nutrition Assessments:  MEDIFICTS Score Key:  ?70 Need to make dietary changes   40-70 Heart Healthy Diet  ? 40 Therapeutic Level Cholesterol Diet    Cardiac Rehab from 12/16/2019 in [facility] Cardiac and Pulmonary Rehab  Picture Your Plate Total Score on Admission 45     Picture Your Plate Scores:  <09 Unhealthy dietary pattern with much room for improvement.  41-50 Dietary pattern unlikely to meet recommendations for good health and room for improvement.  51-60 More healthful dietary pattern, with some room for improvement.   >60 Healthy dietary pattern, although there may be some specific behaviors that could be  improved.    Nutrition Goals Re-Evaluation:   Nutrition Goals Discharge (Final Nutrition Goals Re-Evaluation):   Psychosocial: Target Goals: Acknowledge presence or absence of significant depression and/or stress, maximize coping skills, provide positive support system. Participant is able to verbalize types and ability to use techniques and skills needed for reducing stress and depression.   Education: Stress, Anxiety, and Depression - Group verbal and visual presentation to define topics covered.  Reviews how body is impacted by stress, anxiety, and depression.  Also discusses healthy ways to reduce stress and to treat/manage anxiety and depression.  Written material given at graduation.   Education: Sleep Hygiene -Provides group verbal and written instruction about how sleep can affect your health.  Define sleep hygiene, discuss sleep cycles and impact of sleep habits. Review good sleep hygiene tips.    Initial Review & Psychosocial Screening:  Initial Psych Review & Screening - 12/12/19 1413      Initial Review   Current issues with Current Stress Concerns    Comments Quit smoking      Family Dynamics   Good Support System? Yes      Barriers   Psychosocial barriers to participate in program There are no identifiable barriers or psychosocial needs.;The patient should benefit from training in stress management and relaxation.      Screening Interventions   Interventions Encouraged to exercise;To provide support and resources with identified psychosocial needs;Provide feedback about the scores to participant    Expected Outcomes Short Term goal: Utilizing psychosocial counselor, staff and physician to assist with identification of specific Stressors or current issues interfering with healing process. Setting desired goal for each stressor or current issue identified.;Long Term Goal: Stressors or current issues are controlled or eliminated.;Short Term goal: Identification and  review with participant of any Quality of Life or Depression concerns found by scoring the questionnaire.;Long Term goal: The participant improves quality of Life and PHQ9 Scores as seen by post scores and/or verbalization of changes           Quality of Life Scores:   Quality of Life - 12/16/19 1407      Quality of Life   Select Quality of Life      Quality of Life Scores   Health/Function Pre 20.4 %    Socioeconomic Pre 19 %    Psych/Spiritual Pre  23.14 %    Family Pre 26.4 %    GLOBAL Pre 21.64 %          Scores of 19 and below usually indicate a poorer quality of life in these areas.  A difference of  2-3 points is a clinically meaningful difference.  A difference of 2-3 points in the total score of the Quality of Life Index has been associated with significant improvement in overall quality of life, self-image, physical symptoms, and general health in studies assessing change in quality of life.  PHQ-9: Recent Review Flowsheet Data    Depression screen [facility] 2/9 12/16/2019 10/29/2019 05/20/2019 01/28/2019 10/28/2018   Decreased Interest 0 2 0 1 0   Down, Depressed, Hopeless 0 2 0 1 0   PHQ - 2 Score 0 4 0 2 0   Altered sleeping 1 3 - 0 0   Tired, decreased energy 1 3 - 1 0   Change in appetite 0 3 - 1 0   Feeling bad or failure about yourself  1 3 - 1 0   Trouble concentrating 0 0 - 0 0   Moving slowly or fidgety/restless 1 2 - 0 0   Suicidal thoughts 1  2 - 0 0   PHQ-9 Score 5 20 - 5 0   Difficult doing work/chores Somewhat difficult Somewhat difficult - Not difficult at all Not difficult at all     Interpretation of Total Score  Total Score Depression Severity:  1-4 = Minimal depression, 5-9 = Mild depression, 10-14 = Moderate depression, 15-19 = Moderately severe depression, 20-27 = Severe depression   Psychosocial Evaluation and Intervention:  Psychosocial Evaluation - 12/12/19 1413      Psychosocial Evaluation & Interventions   Comments Margarita reports doing well  post NSTEMI. He did quit smoking when he was admitted on 11/21. He states it has been a struggle but he's making it work. He was made aware about access to resources to continue on this journey of quitting smoking. He wants to get "overall better health" and hopes to achieve that by coming to cardiac rehab.    Expected Outcomes Short: attend cardiac rehab for education and exercise. Long: develop positive self care habits.    Continue Psychosocial Services  Follow up required by staff           Psychosocial Re-Evaluation:   Psychosocial Discharge (Final Psychosocial Re-Evaluation):   Vocational Rehabilitation: Provide vocational rehab assistance to qualifying candidates.   Vocational Rehab Evaluation & Intervention:  Vocational Rehab - 12/12/19 1413      Initial Vocational Rehab Evaluation & Intervention   Assessment shows need for Vocational Rehabilitation No           Education: Education Goals: Education classes will be provided on a variety of topics geared toward better understanding of heart health and risk factor modification. Participant will state understanding/return demonstration of topics presented as noted by education test scores.  Learning Barriers/Preferences:  Learning Barriers/Preferences - 12/12/19 1413      Learning Barriers/Preferences   Learning Barriers None    Learning Preferences None           General Cardiac Education Topics:  AED/CPR: - Group verbal and written instruction with the use of models to demonstrate the basic use of the AED with the basic ABC's of resuscitation.   Anatomy and Cardiac Procedures: - Group verbal and visual presentation and models provide information about basic cardiac anatomy and function. Reviews the testing methods done to  diagnose heart disease and the outcomes of the test results. Describes the treatment choices: Medical Management, Angioplasty, or Coronary Bypass Surgery for treating various heart conditions  including Myocardial Infarction, Angina, Valve Disease, and Cardiac Arrhythmias.  Written material given at graduation.   Medication Safety: - Group verbal and visual instruction to review commonly prescribed medications for heart and lung disease. Reviews the medication, class of the drug, and side effects. Includes the steps to properly store meds and maintain the prescription regimen.  Written material given at graduation.   Intimacy: - Group verbal instruction through game format to discuss how heart and lung disease can affect sexual intimacy. Written material given at graduation..   Know Your Numbers and Heart Failure: - Group verbal and visual instruction to discuss disease risk factors for cardiac and pulmonary disease and treatment options.  Reviews associated critical values for Overweight/Obesity, Hypertension, Cholesterol, and Diabetes.  Discusses basics of heart failure: signs/symptoms and treatments.  Introduces Heart Failure Zone chart for action plan for heart failure.  Written material given at graduation.   Infection Prevention: - Provides verbal and written material to individual with discussion of infection control including proper hand washing and proper equipment cleaning during exercise session.   Cardiac Rehab from 12/16/2019 in [facility] Cardiac and Pulmonary Rehab  Date 12/16/19  Educator [facility]  Instruction Review Code 1- Verbalizes Understanding      Falls Prevention: - Provides verbal and written material to individual with discussion of falls prevention and safety.   Cardiac Rehab from 12/16/2019 in [facility] Cardiac and Pulmonary Rehab  Date 12/16/19  Educator [facility]  Instruction Review Code 1- Verbalizes Understanding      Other: -Provides group and verbal instruction on various topics (see comments)   Knowledge Questionnaire Score:  Knowledge Questionnaire Score - 12/16/19 1408      Knowledge Questionnaire Score   Pre Score 24/26 Education focus: exercise             Core Components/Risk Factors/Patient Goals at Admission:  Personal Goals and Risk Factors at Admission - 12/16/19 1409      Core Components/Risk Factors/Patient Goals on Admission    Weight Management Yes;Weight Gain    Intervention Weight Management: Develop a combined nutrition and exercise program designed to reach desired caloric intake, while maintaining appropriate intake of nutrient and fiber, sodium and fats, and appropriate energy expenditure required for the weight goal.;Weight Management: Provide education and appropriate resources to help participant work on and attain dietary goals.    Admit Weight 119 lb (54 kg)    Goal Weight: Short Term 122 lb (55.3 kg)    Goal Weight: Long Term 125 lb (56.7 kg)    Expected Outcomes Short Term: Continue to assess and modify interventions until short term weight is achieved;Long Term: Adherence to nutrition and physical activity/exercise program aimed toward attainment of established weight goal;Weight Gain: Understanding of general recommendations for a high calorie, high protein meal plan that promotes weight gain by distributing calorie intake throughout the day with the consumption for 4-5 meals, snacks, and/or supplements    Tobacco Cessation Yes    Intervention Assist the participant in steps to quit. Provide individualized education and counseling about committing to Tobacco Cessation, relapse prevention, and pharmacological support that can be provided by physician.;Advice worker, assist with locating and accessing local/national Quit Smoking programs, and support quit date choice.    Expected Outcomes Short Term: Will quit all tobacco product use, adhering to prevention of relapse plan.;Long Term: Complete abstinence  from all tobacco products for at least 12 months from quit date.    Hypertension Yes    Intervention Provide education on lifestyle modifcations including regular physical activity/exercise, weight  management, moderate sodium restriction and increased consumption of fresh fruit, vegetables, and low fat dairy, alcohol moderation, and smoking cessation.;Monitor prescription use compliance.    Expected Outcomes Long Term: Maintenance of blood pressure at goal levels.;Short Term: Continued assessment and intervention until BP is < 140/75mm HG in hypertensive participants. < 130/35mm HG in hypertensive participants with diabetes, heart failure or chronic kidney disease.    Lipids Yes    Intervention Provide education and support for participant on nutrition & aerobic/resistive exercise along with prescribed medications to achieve LDL 70mg , HDL >40mg .    Expected Outcomes Short Term: Participant states understanding of desired cholesterol values and is compliant with medications prescribed. Participant is following exercise prescription and nutrition guidelines.;Long Term: Cholesterol controlled with medications as prescribed, with individualized exercise RX and with personalized nutrition plan. Value goals: LDL < 70mg , HDL > 40 mg.           Education:Diabetes - Individual verbal and written instruction to review signs/symptoms of diabetes, desired ranges of glucose level fasting, after meals and with exercise. Acknowledge that pre and post exercise glucose checks will be done for 3 sessions at entry of program.   Core Components/Risk Factors/Patient Goals Review:    Core Components/Risk Factors/Patient Goals at Discharge (Final Review):    ITP Comments:  ITP Comments    Row Name 12/12/19 1403 12/16/19 1149         ITP Comments Initial orientation completed. Diagnosis can be found in [facility] 11/21. EP orientation scheduled for Tuesday, 12/7 at 9am. Completed 6MWT and gym orientation. Initial ITP created and sent for review to Dr. Emily Filbert, Medical Director.             Comments: Initial ITP

## 2019-12-17 NOTE — Progress Notes (Signed)
Cardiology Office Note:    Date:  12/18/2019   ID:  Brent [name], DOB 07-Oct-1945, MRN [id number]  PCP:  Towanda Malkin, MD  [place] Cardiologist:  No primary care provider on file.  [facility] Electrophysiologist:  None   Referring MD: Towanda Malkin*   Chief Complaint: Hospital follow-up  History of Present Illness:    Brent [name] is a 74 y.o. male with a hx of HTN, HLD,Guillain Barre syndrome, CAD on CT who was recently admitted for NSTEMI with troponin peak of 20,000. He was taken for urgent cath which showed significant left circumflex stenosis however was too small for PCI and medication management was recommended. HE was started on plavix which is to be continued for 1 year. Atorvastatin was increased to 80 mg. He was treated with IV heparin for 48 hours. Also had some NSVT and started on low dose BB.  Echo showed preserved EF 55-60%.   Today the patient reports he has been doing well since discharge. Patient is slowly getting back to activity, he is up and about the house. He started cardiac rehab 2 days ago. Denies chest pain. He has mild sob on exertion. He is taking aspirin and plavix. Denies bleeding or bruising issues. BP today is good. A home blood pressure is normal to low. Patient has been taking atorvastatin and feels queasy after taking it, however will continue to take this and let us know if this continues. Appetite is normal. Cath site healed well.    Past Medical History:  Diagnosis Date  . Aortic calcification (HCC) 07/2015   Noted on chest CT with contrast  . Cataract   . Centrilobular emphysema ([place]) 08/03/2015   Noted on chest CT July 2017  . 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[place]) 07/29/2015  . Coronary artery calcification seen on CAT scan 07/29/2015   Noted on CT scan July 2017  . 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 Guillain Barr syndrome ([place])   . History of colonic polyps   . Hyperlipidemia    . Hypochloremia   . Osteopenia 08/29/2016   August 2018; next scan August 2020  . Right Supraclavicular Lymph node 08/03/2015  . Solitary pulmonary nodule 08/03/2015   CT Chest 07/2015 - Subpleural 3 mm left upper lobe pulmonary nodule, probably benign.  . Tobacco abuse     Past Surgical History:  Procedure Laterality Date  . COLONOSCOPY WITH PROPOFOL N/A 09/22/2014   Procedure: COLONOSCOPY WITH PROPOFOL;  Surgeon: Lucilla Lame, MD;  Location: [facility] ENDOSCOPY;  Service: Endoscopy;  Laterality: N/A;  . COLONOSCOPY WITH PROPOFOL N/A 07/09/2018   Procedure: COLONOSCOPY WITH PROPOFOL;  Surgeon: Lucilla Lame, MD;  Location: [facility] ENDOSCOPY;  Service: Endoscopy;  Laterality: N/A;  . DEEP NECK LYMPH NODE BIOPSY / EXCISION Right   . HERNIA REPAIR  1988  . LEFT HEART CATH AND CORONARY ANGIOGRAPHY N/A 12/01/2019   Procedure: LEFT HEART CATH AND CORONARY ANGIOGRAPHY;  Surgeon: Wellington Hampshire, MD;  Location: [place] CV LAB;  Service: Cardiovascular;  Laterality: N/A;  . LYMPH NODE BIOPSY    . NM MYOVIEW ([place] HX)  10/09/2013   Dr. Lujean Amel: EF 58%. Normal wall motion. No ischemia or infarction.  . shoulder    . SHOULDER ARTHROSCOPY Right   . TRANSTHORACIC ECHOCARDIOGRAM  09/24/2013   EF 45-50% with mild global hypokinesis. GR 1 DD. No valvular lesions besides mild TR. Normal RV pressures.    Current Medications: Current Meds  Medication Sig  . acetaminophen (TYLENOL) 325 MG tablet Take 650 mg by mouth every 6 (six) hours as needed.   Marland Kitchen albuterol (VENTOLIN HFA) 108 (90 Base) MCG/ACT inhaler Inhale 2 puffs into the lungs every 6 (six) hours as needed for wheezing or shortness of breath.  Marland Kitchen aspirin EC 81 MG EC tablet Take 1 tablet (81 mg total) by mouth daily.  Marland Kitchen atorvastatin (LIPITOR) 80 MG tablet Take 1 tablet (80 mg total) by mouth daily.  . clopidogrel (PLAVIX) 75 MG tablet Take 1 tablet (75 mg total) by mouth daily with breakfast.  . gabapentin (NEURONTIN) 100 MG capsule TAKE ONE  CAPSULE BY MOUTH DAILY. ONLY AS NEEDED (Patient taking differently: Take 100 mg by mouth daily.)  . metoprolol succinate (TOPROL-XL) 25 MG 24 hr tablet Take 0.5 tablets (12.5 mg total) by mouth daily.     Allergies:   Influenza vaccines, Pneumococcal vaccine, and Prevnar [pneumococcal 13-val conj vacc]   Social History   Socioeconomic History  . Marital status: Married    Spouse name: [name]  . Number of children: 3  . Years of education: some college  . Highest education level: 12th grade  Occupational History  . Occupation: Retired  Tobacco Use  . Smoking status: Former Smoker    Packs/day: 1.00    Years: 56.00    Pack years: 56.00    Types: Cigarettes    Start date: 1964    Quit date: 11/30/2019    Years since quitting: 0.0  . Smokeless tobacco: Never Used  . Tobacco comment: Quit when he was admitted in the hospital 11/30/19  Vaping Use  . Vaping Use: Never used  Substance and Sexual Activity  . Alcohol use: No  . Drug use: No  . Sexual activity: Not Currently    Partners: Female  Other Topics Concern  . Not on file  Social History Narrative   Current tobacco user, 2-3 cigarettes per day for the past 4-5 months, previously 2 packs per day for 52 years, formerly worked as a Administrator, has dogs at home.   Social Determinants of Health   Financial Resource Strain: High Risk  . Difficulty of Paying Living Expenses: Hard  Food Insecurity: Food Insecurity Present  . Worried About Charity fundraiser in the Last Year: Often true  . Ran Out of Food in the Last Year: Never true  Transportation Needs: No Transportation Needs  . Lack of Transportation (Medical): No  . Lack of Transportation (Non-Medical): No  Physical Activity: Insufficiently Active  . Days of Exercise per Week: 3 days  . Minutes of Exercise per Session: 30 min  Stress: Stress Concern Present  . Feeling of Stress : Rather much  Social Connections: Socially Integrated  . Frequency of Communication  with Friends and Family: Three times a week  . Frequency of Social Gatherings with Friends and Family: Never  . Attends Religious Services: More than 4 times per year  . Active Member of Clubs or Organizations: Yes  . Attends Archivist Meetings: More than 4 times per year  . Marital Status: Married     Family History: The patient's family history includes Alcohol abuse in his brother and mother; Cancer in his paternal grandfather; Diabetes in his sister; Healthy in his daughter, daughter, and son; Heart disease in his sister, sister and other family members; Parkinson's disease in an other family member; Stroke in an other family member. There is no history of Bladder Cancer or Kidney cancer.  ROS:   Please see the history of present illness.     All other systems reviewed and are negative.  EKGs/Labs/Other Studies Reviewed:    The following studies were reviewed today: Echo 12/01/19  1. Left ventricular ejection fraction, by estimation, is 55 to 60%. The  left ventricle has normal function. The left ventricle demonstrates  regional wall motion abnormalities (see scoring diagram/findings for  description). Left ventricular diastolic  parameters are consistent with Grade II diastolic dysfunction  (pseudonormalization). Elevated left atrial pressure. There is akinesis of  the left ventricular, basal inferior segment. There is mild hypokinesis of  the left ventricular, mid inferior  segment.  2. Right ventricular systolic function is normal. The right ventricular  size is normal. There is normal pulmonary artery systolic pressure.  3. Left atrial size was mildly dilated.  4. The mitral valve is normal in structure. Trivial mitral valve  regurgitation. No evidence of mitral stenosis.  5. The aortic valve is tricuspid. Aortic valve regurgitation is not  visualized. No aortic stenosis is present.  6. The inferior vena cava is normal in size with <50% respiratory   variability, suggesting right atrial pressure of 8 mmHg.   Cardiac cath 12/01/19  The left ventricular systolic function is normal.  LV end diastolic pressure is moderately elevated.  The left ventricular ejection fraction is 55-65% by visual estimate.  Prox RCA lesion is 90% stenosed.  Prox LAD to Mid LAD lesion is 40% stenosed.  Mid LAD lesion is 20% stenosed.  1st Diag lesion is 70% stenosed.  Ramus lesion is 60% stenosed.  Mid Cx to Dist Cx lesion is 70% stenosed.  Mid LM to Dist LM lesion is 30% stenosed.   1. Borderline significant one-vessel coronary artery disease affecting the mid left circumflex. The coronary arteries are heavily calcified especially in the proximal segment with no clear culprit for non-ST elevation myocardial infarction. 2. Normal LV systolic function mildly moderately elevated left ventricular end-diastolic pressure at 22 mmHg.  Recommendations: I do not see a clear culprit for non-ST elevation myocardial infarction. Exact etiology for this degree of troponin elevation is still not entirely clear but could be due to an occluded vessel with subsequent recanalization. I am going to add Plavix in addition to aspirin to be used for at least a year. I increase atorvastatin to 80 mg. We will obtain an echocardiogram to evaluate RV function and make sure there is no other etiology for troponin elevation.  Coronary Diagrams   Diagnostic Dominance: Left         EKG:  EKG is ordered today.  The ekg ordered today demonstrates NSR, 83bpm, LVH, TWI V2 (old)  Recent Labs: 10/29/2019: ALT 23 11/30/2019: TSH 1.766 12/02/2019: Magnesium 2.1 12/03/2019: BUN 13; Creatinine, Ser 0.83; Hemoglobin 12.6; Platelets 191; Potassium 3.8; Sodium 136  Recent Lipid Panel    Component Value Date/Time   CHOL 111 12/01/2019 0405   CHOL 158 07/29/2015 1241   TRIG 26 12/01/2019 0405   HDL 49 12/01/2019 0405   HDL 47 07/29/2015 1241   CHOLHDL 2.3 12/01/2019  0405   VLDL 5 12/01/2019 0405   LDLCALC 57 12/01/2019 0405   LDLCALC 61 10/29/2019 0903     Risk Assessment/Calculations:       Physical Exam:    VS:  BP 128/68 (BP Location: Left Arm, Patient Position: Sitting, Cuff Size: Normal)   Pulse 83   Ht 5' 8.5" (1.74 m)   Wt 118 lb (53.5 kg)   SpO2  98%   BMI 17.68 kg/m     Wt Readings from Last 3 Encounters:  12/18/19 118 lb (53.5 kg)  12/16/19 119 lb (54 kg)  12/03/19 120 lb 11.2 oz (54.7 kg)     GEN:  Well nourished, well developed in no acute distress HEENT: Normal NECK: No JVD; No carotid bruits LYMPHATICS: No lymphadenopathy CARDIAC: RRR, no murmurs, rubs, gallops RESPIRATORY:  Clear to auscultation without rales, wheezing or rhonchi  ABDOMEN: Soft, non-tender, non-distended MUSCULOSKELETAL:  No edema; No deformity  SKIN: Warm and dry NEUROLOGIC:  Alert and oriented x 3 PSYCHIATRIC:  Normal affect   ASSESSMENT:    1. Coronary artery disease involving native coronary artery of native heart with angina pectoris ([place])   2. Essential hypertension   3. Hyperlipidemia, mixed   4. Tobacco use    PLAN:    In order of problems listed above:  CAD with recent NSTEMI Patient was recently admitted with NSTEMI and peak troponin of 20,000. LHC showed 1V CAD of the left circumflex, however was too small for PCI. Otherwise arteries were heavily calcified with no clear culprit. Prox RCA was noted to have 90% however this was discussed and it was decided the artery was too small for intervention. Ultimately medical management was recommended. Echo showed LVEF 55-60%, G2DD. Continue DAPT with Aspirin and Plavix for 1 year. No bleeding issues. Atorvastatin was increased to 80mg . Continue metoprolol. BP and heart rate are stable. Continue cardiac rehab. Will send in a prescription for Nitro. Instructions were given.   HTN BP good today. Continue Toprol-XL 12.39m BID.  HLD LDL 57. Atorvastatin increased as above. FLP/LFTs in 2 months.  If medication is still making patient feel bad can decrease dose.  Tobacco use Recommend cessation  NSVT Continue BB  Disposition: Follow up in 3 month(s) with MD/APP   Shared Decision Making/Informed Consent        Signed, Crissy Mccreadie Ninfa Meeker, PA-C  12/18/2019 3:44 PM    [place]

## 2019-12-18 ENCOUNTER — Telehealth: Payer: Self-pay

## 2019-12-18 ENCOUNTER — Other Ambulatory Visit: Payer: Self-pay

## 2019-12-18 ENCOUNTER — Ambulatory Visit (INDEPENDENT_AMBULATORY_CARE_PROVIDER_SITE_OTHER): Payer: Medicare PPO | Admitting: Medical

## 2019-12-18 ENCOUNTER — Encounter: Payer: Self-pay | Admitting: Physician Assistant

## 2019-12-18 VITALS — BP 128/68 | HR 83 | Ht 68.5 in | Wt 118.0 lb

## 2019-12-18 DIAGNOSIS — E782 Mixed hyperlipidemia: Secondary | ICD-10-CM | POA: Diagnosis not present

## 2019-12-18 DIAGNOSIS — I25119 Atherosclerotic heart disease of native coronary artery with unspecified angina pectoris: Secondary | ICD-10-CM | POA: Diagnosis not present

## 2019-12-18 DIAGNOSIS — I10 Essential (primary) hypertension: Secondary | ICD-10-CM | POA: Diagnosis not present

## 2019-12-18 DIAGNOSIS — Z720 Tobacco use: Secondary | ICD-10-CM

## 2019-12-18 MED ORDER — NITROGLYCERIN 0.4 MG SL SUBL: 0.4000 mg | SUBLINGUAL_TABLET | SUBLINGUAL | 3 refills | Status: DC | PRN

## 2019-12-18 NOTE — Telephone Encounter (Signed)
Pt called after his recent visit today, Cadence Ninfa Meeker, PA-C requested that nitroglycerin 0.4mg  SL be added to his medication list. Pt was educated on how to take medication for chest pain and to call office for unrelieved chest pain after taking medication or for any concern regarding medication or chest pain/discomfort. Pt advised he can pick up this medication at his pharmacy that the order has been placed.   Pt will pick up newly order medications at his local pharmacy, otherwise all questions or concerns were address and no additional concerns at this time. Agreeable to plan, will call back for anything further.

## 2019-12-18 NOTE — Patient Instructions (Signed)
Medication Instructions:  No medications changes  *If you need a refill on your cardiac medications before your next appointment, please call your pharmacy*   Lab Work: Fasting Lipid panel and Hepatic function (LFT) panel in 2 months  Walk into medical mall at the check in desk, they will direct you to lab registration, hours for labs are Monday-Friday 07:00am-5:30pm (no appointment necessary)   If you have any lab test that is abnormal or we need to change your treatment, we will call you to review the results.   Testing/Procedures: none   Follow-Up: At [facility], you and your health needs are our priority.  As part of our continuing mission to provide you with exceptional heart care, we have created designated Provider Care Teams.  These Care Teams include your primary Cardiologist (physician) and Advanced Practice Providers (APPs -  Physician Assistants and Nurse Practitioners) who all work together to provide you with the care you need, when you need it.  We recommend signing up for the patient portal called "MyChart".  Sign up information is provided on this After Visit Summary.  MyChart is used to connect with patients for Virtual Visits (Telemedicine).  Patients are able to view lab/test results, encounter notes, upcoming appointments, etc.  Non-urgent messages can be sent to your provider as well.   To learn more about what you can do with MyChart, go to [email or web address].    Your next appointment:   3 month(s)  The format for your next appointment:   In Person  Provider:   [name], MD   Other Instructions N/A

## 2019-12-23 ENCOUNTER — Other Ambulatory Visit: Payer: Self-pay

## 2019-12-23 ENCOUNTER — Encounter: Payer: Medicare PPO | Admitting: *Deleted

## 2019-12-23 DIAGNOSIS — I214 Non-ST elevation (NSTEMI) myocardial infarction: Secondary | ICD-10-CM | POA: Diagnosis not present

## 2019-12-23 NOTE — Progress Notes (Signed)
Daily Session Note  Patient Details  Name: Brent [name] MRN: [id number] Date of Birth: Jul 05, 1945 Referring Provider:   Flowsheet Row Cardiac Rehab from 12/16/2019 in [facility] Cardiac and Pulmonary Rehab  Referring Provider [name] MD      Encounter Date: 12/23/2019  Check In:  Session Check In - 12/23/19 0842      Check-In   Supervising physician immediately available to respond to emergencies See telemetry face sheet for immediately available ER MD    Location [facility]-Cardiac & Pulmonary Rehab    Staff Present Nada Maclachlan, BA, ACSM CEP, Exercise Physiologist;Kara Eliezer Bottom, MS Exercise Physiologist;Zulay Corrie, RN, BSN, CCRP    Virtual Visit No    Medication changes reported     No    Fall or balance concerns reported    No    Warm-up and Cool-down Performed on first and last piece of equipment    Resistance Training Performed Yes    VAD Patient? No    PAD/SET Patient? No      Pain Assessment   Currently in Pain? No/denies              Social History   Tobacco Use  Smoking Status Former Smoker  . Packs/day: 1.00  . Years: 56.00  . Pack years: 56.00  . Types: Cigarettes  . Start date: 1964  . Quit date: 11/30/2019  . Years since quitting: 0.0  Smokeless Tobacco Never Used  Tobacco Comment   Quit when he was admitted in the hospital 11/30/19    Goals Met:  Exercise tolerated well Personal goals reviewed No report of cardiac concerns or symptoms  Goals Unmet:  Not Applicable  Comments: First full day of exercise!  Patient was oriented to gym and equipment including functions, settings, policies, and procedures.  Patient's individual exercise prescription and treatment plan were reviewed.  All starting workloads were established based on the results of the 6 minute walk test done at initial orientation visit.  The plan for exercise progression was also introduced and progression will be customized based on patient's performance and goals.    Dr.  Emily Filbert is Medical Director for [place] and [facility].

## 2019-12-30 ENCOUNTER — Encounter: Payer: Medicare PPO | Admitting: *Deleted

## 2019-12-30 ENCOUNTER — Other Ambulatory Visit: Payer: Self-pay

## 2019-12-30 DIAGNOSIS — I214 Non-ST elevation (NSTEMI) myocardial infarction: Secondary | ICD-10-CM | POA: Diagnosis not present

## 2019-12-30 NOTE — Progress Notes (Signed)
Daily Session Note  Patient Details  Name: Brent [name] MRN: [id number] Date of Birth: 08-20-1945 Referring Provider:   Flowsheet Row Cardiac Rehab from 12/16/2019 in [facility] Cardiac and Pulmonary Rehab  Referring Provider [name] MD      Encounter Date: 12/30/2019  Check In:  Session Check In - 12/30/19 3013      Check-In   Supervising physician immediately available to respond to emergencies See telemetry face sheet for immediately available ER MD    Location [facility]-Cardiac & Pulmonary Rehab    Staff Present Heath Lark, RN, BSN, Jacklynn Bue, MS Exercise Physiologist;Amanda Oletta Darter, [place], ACSM CEP, Exercise Physiologist    Virtual Visit No    Medication changes reported     No    Fall or balance concerns reported    No    Warm-up and Cool-down Performed on first and last piece of equipment    Resistance Training Performed No    VAD Patient? No    PAD/SET Patient? No      Pain Assessment   Currently in Pain? No/denies              Social History   Tobacco Use  Smoking Status Former Smoker   Packs/day: 1.00   Years: 56.00   Pack years: 56.00   Types: Cigarettes   Start date: 1964   Quit date: 11/30/2019   Years since quitting: 0.0  Smokeless Tobacco Never Used  Tobacco Comment   Quit when he was admitted in the hospital 11/30/19    Goals Met:  Exercise tolerated well No report of cardiac concerns or symptoms  Goals Unmet:  Not Applicable  Comments: Pt able to follow exercise prescription today without complaint.  Will continue to monitor for progression.    Dr. Emily Filbert is Medical Director for [place] and [facility].

## 2020-01-01 ENCOUNTER — Other Ambulatory Visit: Payer: Self-pay

## 2020-01-01 DIAGNOSIS — I214 Non-ST elevation (NSTEMI) myocardial infarction: Secondary | ICD-10-CM | POA: Diagnosis not present

## 2020-01-01 NOTE — Progress Notes (Signed)
Daily Session Note  Patient Details  Name: Brent [name] MRN: [id number] Date of Birth: Sep 23, 1945 Referring Provider:   Flowsheet Row Cardiac Rehab from 12/16/2019 in [facility] Cardiac and Pulmonary Rehab  Referring Provider [name] MD      Encounter Date: 01/01/2020  Check In:  Session Check In - 01/01/20 0745      Check-In   Supervising physician immediately available to respond to emergencies See telemetry face sheet for immediately available ER MD    Location [facility]-Cardiac & Pulmonary Rehab    Staff Present Birdie Sons, MPA, RN;Melissa Caiola RDN, Rowe Pavy, BA, ACSM CEP, Exercise Physiologist    Virtual Visit No    Medication changes reported     No    Fall or balance concerns reported    No    Warm-up and Cool-down Performed on first and last piece of equipment    Resistance Training Performed Yes    VAD Patient? No    PAD/SET Patient? No      Pain Assessment   Currently in Pain? No/denies              Social History   Tobacco Use  Smoking Status Former Smoker   Packs/day: 1.00   Years: 56.00   Pack years: 56.00   Types: Cigarettes   Start date: 1964   Quit date: 11/30/2019   Years since quitting: 0.0  Smokeless Tobacco Never Used  Tobacco Comment   Quit when he was admitted in the hospital 11/30/19    Goals Met:  Independence with exercise equipment Exercise tolerated well No report of cardiac concerns or symptoms Strength training completed today  Goals Unmet:  Not Applicable  Comments: Pt able to follow exercise prescription today without complaint.  Will continue to monitor for progression.    Dr. Emily Filbert is Medical Director for [place] and [facility].

## 2020-01-06 ENCOUNTER — Other Ambulatory Visit: Payer: Self-pay

## 2020-01-06 ENCOUNTER — Encounter: Payer: Medicare PPO | Admitting: *Deleted

## 2020-01-06 DIAGNOSIS — I214 Non-ST elevation (NSTEMI) myocardial infarction: Secondary | ICD-10-CM

## 2020-01-06 NOTE — Progress Notes (Signed)
Daily Session Note  Patient Details  Name: Brent [name] MRN: [id number] Date of Birth: 13-Jun-1945 Referring Provider:   Flowsheet Row Cardiac Rehab from 12/16/2019 in [name] Cardiac and Pulmonary Rehab  Referring Provider [name] MD      Encounter Date: 01/06/2020  Check In:  Session Check In - 01/06/20 1005      Check-In   Supervising physician immediately available to respond to emergencies See telemetry face sheet for immediately available ER MD    Location [facility]-Cardiac & Pulmonary Rehab    Staff Present Nada Maclachlan, BA, ACSM CEP, Exercise Physiologist;Melissa Caiola RDN, LDN;Jafet [place], RN, BSN, CCRP    Virtual Visit No    Medication changes reported     No    Fall or balance concerns reported    No    Warm-up and Cool-down Performed on first and last piece of equipment    Resistance Training Performed Yes    VAD Patient? No    PAD/SET Patient? No      Pain Assessment   Currently in Pain? No/denies              Social History   Tobacco Use  Smoking Status Former Smoker  . Packs/day: 1.00  . Years: 56.00  . Pack years: 56.00  . Types: Cigarettes  . Start date: 1964  . Quit date: 11/30/2019  . Years since quitting: 0.1  Smokeless Tobacco Never Used  Tobacco Comment   Quit when he was admitted in the hospital 11/30/19    Goals Met:  Independence with exercise equipment Exercise tolerated well No report of cardiac concerns or symptoms  Goals Unmet:  Not Applicable  Comments: Pt able to follow exercise prescription today without complaint.  Will continue to monitor for progression.    Dr. Emily Filbert is Medical Director for [place] and [facility].

## 2020-01-07 ENCOUNTER — Encounter: Payer: Self-pay | Admitting: *Deleted

## 2020-01-07 DIAGNOSIS — I214 Non-ST elevation (NSTEMI) myocardial infarction: Secondary | ICD-10-CM

## 2020-01-07 NOTE — Progress Notes (Signed)
Cardiac Individual Treatment Plan  Patient Details  Name: Brent [name] MRN: [id number] Date of Birth: 04/03/1945 Referring Provider:   Flowsheet Row Cardiac Rehab from 12/16/2019 in [facility] Cardiac and Pulmonary Rehab  Referring Provider Lorine Bears MD      Initial Encounter Date:  Flowsheet Row Cardiac Rehab from 12/16/2019 in [facility] Cardiac and Pulmonary Rehab  Date 12/16/19      Visit Diagnosis: NSTEMI (non-ST elevated myocardial infarction) [facility])  Patient's Home Medications on Admission:  Current Outpatient Medications:  .  acetaminophen (TYLENOL) 325 MG tablet, Take 650 mg by mouth every 6 (six) hours as needed. , Disp: , Rfl:  .  albuterol (VENTOLIN HFA) 108 (90 Base) MCG/ACT inhaler, Inhale 2 puffs into the lungs every 6 (six) hours as needed for wheezing or shortness of breath., Disp: 6.7 g, Rfl: 2 .  aspirin EC 81 MG EC tablet, Take 1 tablet (81 mg total) by mouth daily., Disp: , Rfl:  .  atorvastatin (LIPITOR) 80 MG tablet, Take 1 tablet (80 mg total) by mouth daily., Disp: 30 tablet, Rfl: 2 .  clopidogrel (PLAVIX) 75 MG tablet, Take 1 tablet (75 mg total) by mouth daily with breakfast., Disp: 30 tablet, Rfl: 11 .  gabapentin (NEURONTIN) 100 MG capsule, TAKE ONE CAPSULE BY MOUTH DAILY. ONLY AS NEEDED (Patient taking differently: Take 100 mg by mouth daily.), Disp: 90 capsule, Rfl: 0 .  metoprolol succinate (TOPROL-XL) 25 MG 24 hr tablet, Take 0.5 tablets (12.5 mg total) by mouth daily., Disp: 30 tablet, Rfl: 11 .  nitroGLYCERIN (NITROSTAT) 0.4 MG SL tablet, Place 1 tablet (0.4 mg total) under the tongue every 5 (five) minutes as needed for chest pain., Disp: 30 tablet, Rfl: 3  Past Medical History: Past Medical History:  Diagnosis Date  . Aortic calcification (HCC) 07/2015   Noted on chest CT with contrast  . Cataract   . Centrilobular emphysema (HCC) 08/03/2015   Noted on chest CT July 2017  . 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  . Coronary artery calcification  seen on CAT scan 07/29/2015   Noted on CT scan July 2017  . 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 Guillain Barr syndrome (HCC)   . History of colonic polyps   . Hyperlipidemia   . Hypochloremia   . Osteopenia 08/29/2016   August 2018; next scan August 2020  . Right Supraclavicular Lymph node 08/03/2015  . Solitary pulmonary nodule 08/03/2015   CT Chest 07/2015 - Subpleural 3 mm left upper lobe pulmonary nodule, probably benign.  . Tobacco abuse     Tobacco Use: Social History   Tobacco Use  Smoking Status Former Smoker  . Packs/day: 1.00  . Years: 56.00  . Pack years: 56.00  . Types: Cigarettes  . Start date: 1964  . Quit date: 11/30/2019  . Years since quitting: 0.1  Smokeless Tobacco Never Used  Tobacco Comment   Quit when he was admitted in the hospital 11/30/19    Labs: Recent Review Flowsheet Data    Labs for ITP Cardiac and Pulmonary Rehab Latest Ref Rng & Units 03/27/2016 05/24/2017 10/28/2018 10/29/2019 12/01/2019   Cholestrol 0 - 200 mg/dL 409 811 [phone]   LDLCALC 0 - 99 mg/dL 87 94 213(Y) 61 57   HDL >40 mg/dL 48 51 59 65 49   Trlycerides <150 mg/dL 865 95 97 59 26   Hemoglobin A1c 4.2 - 6.3 % - - - - -  Exercise Target Goals: Exercise Program Goal: Individual exercise prescription set using results from initial 6 min walk test and THRR while considering  patient's activity barriers and safety.   Exercise Prescription Goal: Initial exercise prescription builds to 30-45 minutes a day of aerobic activity, 2-3 days per week.  Home exercise guidelines will be given to patient during program as part of exercise prescription that the participant will acknowledge.   Education: Aerobic Exercise: - Group verbal and visual presentation on the components of exercise prescription. Introduces F.I.T.T principle from ACSM for exercise prescriptions.  Reviews F.I.T.T. principles of aerobic exercise including progression. Written  material given at graduation. Flowsheet Row Cardiac Rehab from 12/16/2019 in [facility] Cardiac and Pulmonary Rehab  Education need identified 12/16/19      Education: Resistance Exercise: - Group verbal and visual presentation on the components of exercise prescription. Introduces F.I.T.T principle from ACSM for exercise prescriptions  Reviews F.I.T.T. principles of resistance exercise including progression. Written material given at graduation. Flowsheet Row Cardiac Rehab from 12/16/2019 in [facility] Cardiac and Pulmonary Rehab  Education need identified 12/16/19       Education: Exercise & Equipment Safety: - Individual verbal instruction and demonstration of equipment use and safety with use of the equipment. Flowsheet Row Cardiac Rehab from 12/16/2019 in [facility] Cardiac and Pulmonary Rehab  Date 12/16/19  Educator [facility]  Instruction Review Code 1- Verbalizes Understanding      Education: Exercise Physiology & General Exercise Guidelines: - Group verbal and written instruction with models to review the exercise physiology of the cardiovascular system and associated critical values. Provides general exercise guidelines with specific guidelines to those with heart or lung disease.    Education: Flexibility, Balance, Mind/Body Relaxation: - Group verbal and visual presentation with interactive activity on the components of exercise prescription. Introduces F.I.T.T principle from ACSM for exercise prescriptions. Reviews F.I.T.T. principles of flexibility and balance exercise training including progression. Also discusses the mind body connection.  Reviews various relaxation techniques to help reduce and manage stress (i.e. Deep breathing, progressive muscle relaxation, and visualization). Balance handout provided to take home. Written material given at graduation.   Activity Barriers & Risk Stratification:  Activity Barriers & Cardiac Risk Stratification - 12/16/19 1300      Activity Barriers & Cardiac  Risk Stratification   Activity Barriers Back Problems;Deconditioning;Muscular Weakness;Shortness of Breath;Balance Concerns    Cardiac Risk Stratification High           6 Minute Walk:  6 Minute Walk    Row Name 12/16/19 1300         6 Minute Walk   Phase Initial     Distance 1120 feet     Walk Time 6 minutes     # of Rest Breaks 0     MPH 2.12     METS 3.29     RPE 12     Perceived Dyspnea  1     VO2 Peak 11.5     Symptoms Yes (comment)     Comments SOB     Resting HR 89 bpm     Resting BP 150/74     Resting Oxygen Saturation  97 %     Exercise Oxygen Saturation  during 6 min walk 98 %     Max Ex. HR 105 bpm     Max Ex. BP 154/62     2 Minute Post BP 132/74            Oxygen Initial Assessment:   Oxygen  Re-Evaluation:   Oxygen Discharge (Final Oxygen Re-Evaluation):   Initial Exercise Prescription:  Initial Exercise Prescription - 12/16/19 1300      Date of Initial Exercise RX and Referring Provider   Date 12/16/19    Referring Provider Lorine Bears MD      Treadmill   MPH 2    Grade 0.5    Minutes 15    METs 2.67      Recumbant Bike   Level 2    RPM 50    Watts 22    Minutes 15    METs 2.5      NuStep   Level 2    SPM 80    Minutes 15    METs 2.5      Recumbant Elliptical   Level 1    RPM 50    Minutes 15    METs 2.5      Prescription Details   Frequency (times per week) 2    Duration Progress to 30 minutes of continuous aerobic without signs/symptoms of physical distress      Intensity   THRR 40-80% of Max Heartrate 112-135    Ratings of Perceived Exertion 11-13    Perceived Dyspnea 0-4      Progression   Progression Continue to progress workloads to maintain intensity without signs/symptoms of physical distress.      Resistance Training   Training Prescription Yes    Weight 3 lb    Reps 10-15           Perform Capillary Blood Glucose checks as needed.  Exercise Prescription Changes:  Exercise Prescription  Changes    Row Name 12/16/19 1300 12/29/19 1600           Response to Exercise   Blood Pressure (Admit) 150/74 98/52      Blood Pressure (Exercise) 154/62 112/56      Blood Pressure (Exit) 132/74 96/56      Heart Rate (Admit) 89 bpm 93 bpm      Heart Rate (Exercise) 105 bpm 102 bpm      Heart Rate (Exit) 90 bpm 88 bpm      Oxygen Saturation (Admit) 97 % --      Oxygen Saturation (Exercise) 98 % --      Rating of Perceived Exertion (Exercise) 12 10      Perceived Dyspnea (Exercise) 1 --      Symptoms SOB none      Comments walk test results first full day of exercise      Duration -- Progress to 30 minutes of  aerobic without signs/symptoms of physical distress      Intensity -- THRR unchanged             Progression   Progression -- Continue to progress workloads to maintain intensity without signs/symptoms of physical distress.      Average METs -- 2.9             Resistance Training   Training Prescription -- Yes      Weight -- 3 lb      Reps -- 10-15             Interval Training   Interval Training -- No             Recumbant Bike   Level -- 2      Minutes -- 15             NuStep   Level -- 2  Minutes -- 15      METs -- 2.9             Exercise Comments:  Exercise Comments    Row Name 12/23/19 0843           Exercise Comments First full day of exercise!  Patient was oriented to gym and equipment including functions, settings, policies, and procedures.  Patient's individual exercise prescription and treatment plan were reviewed.  All starting workloads were established based on the results of the 6 minute walk test done at initial orientation visit.  The plan for exercise progression was also introduced and progression will be customized based on patient's performance and goals.              Exercise Goals and Review:  Exercise Goals    Row Name 12/16/19 1405             Exercise Goals   Increase Physical Activity Yes        Intervention Provide advice, education, support and counseling about physical activity/exercise needs.;Develop an individualized exercise prescription for aerobic and resistive training based on initial evaluation findings, risk stratification, comorbidities and participant's personal goals.       Expected Outcomes Short Term: Attend rehab on a regular basis to increase amount of physical activity.;Long Term: Add in home exercise to make exercise part of routine and to increase amount of physical activity.;Long Term: Exercising regularly at least 3-5 days a week.       Increase Strength and Stamina Yes       Intervention Provide advice, education, support and counseling about physical activity/exercise needs.;Develop an individualized exercise prescription for aerobic and resistive training based on initial evaluation findings, risk stratification, comorbidities and participant's personal goals.       Expected Outcomes Short Term: Increase workloads from initial exercise prescription for resistance, speed, and METs.;Short Term: Perform resistance training exercises routinely during rehab and add in resistance training at home;Long Term: Improve cardiorespiratory fitness, muscular endurance and strength as measured by increased METs and functional capacity ( )       Able to understand and use rate of perceived exertion (RPE) scale Yes       Intervention Provide education and explanation on how to use RPE scale       Expected Outcomes Short Term: Able to use RPE daily in rehab to express subjective intensity level;Long Term:  Able to use RPE to guide intensity level when exercising independently       Able to understand and use Dyspnea scale Yes       Intervention Provide education and explanation on how to use Dyspnea scale       Expected Outcomes Short Term: Able to use Dyspnea scale daily in rehab to express subjective sense of shortness of breath during exertion;Long Term: Able to use Dyspnea scale to  guide intensity level when exercising independently       Knowledge and understanding of Target Heart Rate Range (THRR) Yes       Intervention Provide education and explanation of THRR including how the numbers were predicted and where they are located for reference       Expected Outcomes Short Term: Able to state/look up THRR;Short Term: Able to use daily as guideline for intensity in rehab;Long Term: Able to use THRR to govern intensity when exercising independently       Able to check pulse independently Yes       Intervention Provide education and demonstration on  how to check pulse in carotid and radial arteries.;Review the importance of being able to check your own pulse for safety during independent exercise       Expected Outcomes Short Term: Able to explain why pulse checking is important during independent exercise;Long Term: Able to check pulse independently and accurately       Understanding of Exercise Prescription Yes       Intervention Provide education, explanation, and written materials on patient's individual exercise prescription       Expected Outcomes Short Term: Able to explain program exercise prescription;Long Term: Able to explain home exercise prescription to exercise independently              Exercise Goals Re-Evaluation :  Exercise Goals Re-Evaluation    Row Name 12/23/19 0843 12/29/19 1646           Exercise Goal Re-Evaluation   Exercise Goals Review Able to understand and use rate of perceived exertion (RPE) scale;Knowledge and understanding of Target Heart Rate Range (THRR);Understanding of Exercise Prescription;Able to understand and use Dyspnea scale Increase Physical Activity;Increase Strength and Stamina;Understanding of Exercise Prescription      Comments Reviewed RPE and dyspnea scales, THR and program prescription with pt today.  Pt voiced understanding and was given a copy of goals to take home. Merton Borderrthur has completed his first full day of exercise. He  did well and we will continue to monitor his progress.      Expected Outcomes Short: Use RPE daily to regulate intensity. Long: Follow program prescription in THR. Short: Attend rehab regularly Long: Continue to follow program prescription             Discharge Exercise Prescription (Final Exercise Prescription Changes):  Exercise Prescription Changes - 12/29/19 1600      Response to Exercise   Blood Pressure (Admit) 98/52    Blood Pressure (Exercise) 112/56    Blood Pressure (Exit) 96/56    Heart Rate (Admit) 93 bpm    Heart Rate (Exercise) 102 bpm    Heart Rate (Exit) 88 bpm    Rating of Perceived Exertion (Exercise) 10    Symptoms none    Comments first full day of exercise    Duration Progress to 30 minutes of  aerobic without signs/symptoms of physical distress    Intensity THRR unchanged      Progression   Progression Continue to progress workloads to maintain intensity without signs/symptoms of physical distress.    Average METs 2.9      Resistance Training   Training Prescription Yes    Weight 3 lb    Reps 10-15      Interval Training   Interval Training No      Recumbant Bike   Level 2    Minutes 15      NuStep   Level 2    Minutes 15    METs 2.9           Nutrition:  Target Goals: Understanding of nutrition guidelines, daily intake of sodium 1500mg , cholesterol 200mg , calories 30% from fat and 7% or less from saturated fats, daily to have 5 or more servings of fruits and vegetables.  Education: All About Nutrition: -Group instruction provided by verbal, written material, interactive activities, discussions, models, and posters to present general guidelines for heart healthy nutrition including fat, fiber, MyPlate, the role of sodium in heart healthy nutrition, utilization of the nutrition label, and utilization of this knowledge for meal planning. Follow up email sent as  well. Written material given at graduation.   Biometrics:  Pre Biometrics -  12/16/19 1406      Pre Biometrics   Height 5' 8.9" (1.75 m)    Weight 119 lb (54 kg)    BMI (Calculated) 17.63    Single Leg Stand 8.7 seconds            Nutrition Therapy Plan and Nutrition Goals:   Nutrition Assessments:  MEDIFICTS Score Key:  ?70 Need to make dietary changes   40-70 Heart Healthy Diet  ? 40 Therapeutic Level Cholesterol Diet  Flowsheet Row Cardiac Rehab from 12/16/2019 in [facility] Cardiac and Pulmonary Rehab  Picture Your Plate Total Score on Admission 45     Picture Your Plate Scores:  <59 Unhealthy dietary pattern with much room for improvement.  41-50 Dietary pattern unlikely to meet recommendations for good health and room for improvement.  51-60 More healthful dietary pattern, with some room for improvement.   >60 Healthy dietary pattern, although there may be some specific behaviors that could be improved.    Nutrition Goals Re-Evaluation:   Nutrition Goals Discharge (Final Nutrition Goals Re-Evaluation):   Psychosocial: Target Goals: Acknowledge presence or absence of significant depression and/or stress, maximize coping skills, provide positive support system. Participant is able to verbalize types and ability to use techniques and skills needed for reducing stress and depression.   Education: Stress, Anxiety, and Depression - Group verbal and visual presentation to define topics covered.  Reviews how body is impacted by stress, anxiety, and depression.  Also discusses healthy ways to reduce stress and to treat/manage anxiety and depression.  Written material given at graduation.   Education: Sleep Hygiene -Provides group verbal and written instruction about how sleep can affect your health.  Define sleep hygiene, discuss sleep cycles and impact of sleep habits. Review good sleep hygiene tips.    Initial Review & Psychosocial Screening:  Initial Psych Review & Screening - 12/12/19 1413      Initial Review   Current issues with  Current Stress Concerns    Comments Quit smoking      Family Dynamics   Good Support System? Yes      Barriers   Psychosocial barriers to participate in program There are no identifiable barriers or psychosocial needs.;The patient should benefit from training in stress management and relaxation.      Screening Interventions   Interventions Encouraged to exercise;To provide support and resources with identified psychosocial needs;Provide feedback about the scores to participant    Expected Outcomes Short Term goal: Utilizing psychosocial counselor, staff and physician to assist with identification of specific Stressors or current issues interfering with healing process. Setting desired goal for each stressor or current issue identified.;Long Term Goal: Stressors or current issues are controlled or eliminated.;Short Term goal: Identification and review with participant of any Quality of Life or Depression concerns found by scoring the questionnaire.;Long Term goal: The participant improves quality of Life and PHQ9 Scores as seen by post scores and/or verbalization of changes           Quality of Life Scores:   Quality of Life - 12/16/19 1407      Quality of Life   Select Quality of Life      Quality of Life Scores   Health/Function Pre 20.4 %    Socioeconomic Pre 19 %    Psych/Spiritual Pre 23.14 %    Family Pre 26.4 %    GLOBAL Pre 21.64 %  Scores of 19 and below usually indicate a poorer quality of life in these areas.  A difference of  2-3 points is a clinically meaningful difference.  A difference of 2-3 points in the total score of the Quality of Life Index has been associated with significant improvement in overall quality of life, self-image, physical symptoms, and general health in studies assessing change in quality of life.  PHQ-9: Recent Review Flowsheet Data    Depression screen [facility] 2/9 12/16/2019 10/29/2019 05/20/2019 01/28/2019 10/28/2018   Decreased Interest 0  2 0 1 0   Down, Depressed, Hopeless 0 2 0 1 0   PHQ - 2 Score 0 4 0 2 0   Altered sleeping 1 3 - 0 0   Tired, decreased energy 1 3 - 1 0   Change in appetite 0 3 - 1 0   Feeling bad or failure about yourself  1 3 - 1 0   Trouble concentrating 0 0 - 0 0   Moving slowly or fidgety/restless 1 2 - 0 0   Suicidal thoughts 1  2 - 0 0   PHQ-9 Score 5 20 - 5 0   Difficult doing work/chores Somewhat difficult Somewhat difficult - Not difficult at all Not difficult at all     Interpretation of Total Score  Total Score Depression Severity:  1-4 = Minimal depression, 5-9 = Mild depression, 10-14 = Moderate depression, 15-19 = Moderately severe depression, 20-27 = Severe depression   Psychosocial Evaluation and Intervention:  Psychosocial Evaluation - 12/12/19 1413      Psychosocial Evaluation & Interventions   Comments Julies reports doing well post NSTEMI. He did quit smoking when he was admitted on 11/21. He states it has been a struggle but he's making it work. He was made aware about access to resources to continue on this journey of quitting smoking. He wants to get "overall better health" and hopes to achieve that by coming to cardiac rehab.    Expected Outcomes Short: attend cardiac rehab for education and exercise. Long: develop positive self care habits.    Continue Psychosocial Services  Follow up required by staff           Psychosocial Re-Evaluation:   Psychosocial Discharge (Final Psychosocial Re-Evaluation):   Vocational Rehabilitation: Provide vocational rehab assistance to qualifying candidates.   Vocational Rehab Evaluation & Intervention:  Vocational Rehab - 12/12/19 1413      Initial Vocational Rehab Evaluation & Intervention   Assessment shows need for Vocational Rehabilitation No           Education: Education Goals: Education classes will be provided on a variety of topics geared toward better understanding of heart health and risk factor modification.  Participant will state understanding/return demonstration of topics presented as noted by education test scores.  Learning Barriers/Preferences:  Learning Barriers/Preferences - 12/12/19 1413      Learning Barriers/Preferences   Learning Barriers None    Learning Preferences None           General Cardiac Education Topics:  AED/CPR: - Group verbal and written instruction with the use of models to demonstrate the basic use of the AED with the basic ABC's of resuscitation.   Anatomy and Cardiac Procedures: - Group verbal and visual presentation and models provide information about basic cardiac anatomy and function. Reviews the testing methods done to diagnose heart disease and the outcomes of the test results. Describes the treatment choices: Medical Management, Angioplasty, or Coronary Bypass Surgery for treating various  heart conditions including Myocardial Infarction, Angina, Valve Disease, and Cardiac Arrhythmias.  Written material given at graduation.   Medication Safety: - Group verbal and visual instruction to review commonly prescribed medications for heart and lung disease. Reviews the medication, class of the drug, and side effects. Includes the steps to properly store meds and maintain the prescription regimen.  Written material given at graduation.   Intimacy: - Group verbal instruction through game format to discuss how heart and lung disease can affect sexual intimacy. Written material given at graduation..   Know Your Numbers and Heart Failure: - Group verbal and visual instruction to discuss disease risk factors for cardiac and pulmonary disease and treatment options.  Reviews associated critical values for Overweight/Obesity, Hypertension, Cholesterol, and Diabetes.  Discusses basics of heart failure: signs/symptoms and treatments.  Introduces Heart Failure Zone chart for action plan for heart failure.  Written material given at graduation.   Infection  Prevention: - Provides verbal and written material to individual with discussion of infection control including proper hand washing and proper equipment cleaning during exercise session. Flowsheet Row Cardiac Rehab from 12/16/2019 in [facility] Cardiac and Pulmonary Rehab  Date 12/16/19  Educator [facility]  Instruction Review Code 1- Verbalizes Understanding      Falls Prevention: - Provides verbal and written material to individual with discussion of falls prevention and safety. Flowsheet Row Cardiac Rehab from 12/16/2019 in [facility] Cardiac and Pulmonary Rehab  Date 12/16/19  Educator [facility]  Instruction Review Code 1- Verbalizes Understanding      Other: -Provides group and verbal instruction on various topics (see comments)   Knowledge Questionnaire Score:  Knowledge Questionnaire Score - 12/16/19 1408      Knowledge Questionnaire Score   Pre Score 24/26 Education focus: exercise           Core Components/Risk Factors/Patient Goals at Admission:  Personal Goals and Risk Factors at Admission - 12/16/19 1409      Core Components/Risk Factors/Patient Goals on Admission    Weight Management Yes;Weight Gain    Intervention Weight Management: Develop a combined nutrition and exercise program designed to reach desired caloric intake, while maintaining appropriate intake of nutrient and fiber, sodium and fats, and appropriate energy expenditure required for the weight goal.;Weight Management: Provide education and appropriate resources to help participant work on and attain dietary goals.    Admit Weight 119 lb (54 kg)    Goal Weight: Short Term 122 lb (55.3 kg)    Goal Weight: Long Term 125 lb (56.7 kg)    Expected Outcomes Short Term: Continue to assess and modify interventions until short term weight is achieved;Long Term: Adherence to nutrition and physical activity/exercise program aimed toward attainment of established weight goal;Weight Gain: Understanding of general recommendations for a high  calorie, high protein meal plan that promotes weight gain by distributing calorie intake throughout the day with the consumption for 4-5 meals, snacks, and/or supplements    Tobacco Cessation Yes    Intervention Assist the participant in steps to quit. Provide individualized education and counseling about committing to Tobacco Cessation, relapse prevention, and pharmacological support that can be provided by physician.;Education officer, environmental, assist with locating and accessing local/national Quit Smoking programs, and support quit date choice.    Expected Outcomes Short Term: Will quit all tobacco product use, adhering to prevention of relapse plan.;Long Term: Complete abstinence from all tobacco products for at least 12 months from quit date.    Hypertension Yes    Intervention Provide education on lifestyle  modifcations including regular physical activity/exercise, weight management, moderate sodium restriction and increased consumption of fresh fruit, vegetables, and low fat dairy, alcohol moderation, and smoking cessation.;Monitor prescription use compliance.    Expected Outcomes Long Term: Maintenance of blood pressure at goal levels.;Short Term: Continued assessment and intervention until BP is < 140/71mm HG in hypertensive participants. < 130/5mm HG in hypertensive participants with diabetes, heart failure or chronic kidney disease.    Lipids Yes    Intervention Provide education and support for participant on nutrition & aerobic/resistive exercise along with prescribed medications to achieve LDL 70mg , HDL >40mg .    Expected Outcomes Short Term: Participant states understanding of desired cholesterol values and is compliant with medications prescribed. Participant is following exercise prescription and nutrition guidelines.;Long Term: Cholesterol controlled with medications as prescribed, with individualized exercise RX and with personalized nutrition plan. Value goals: LDL < 70mg , HDL > 40  mg.           Education:Diabetes - Individual verbal and written instruction to review signs/symptoms of diabetes, desired ranges of glucose level fasting, after meals and with exercise. Acknowledge that pre and post exercise glucose checks will be done for 3 sessions at entry of program.   Core Components/Risk Factors/Patient Goals Review:    Core Components/Risk Factors/Patient Goals at Discharge (Final Review):    ITP Comments:  ITP Comments    Row Name 12/12/19 1403 12/16/19 1149 12/23/19 0842 01/07/20 0731     ITP Comments Initial orientation completed. Diagnosis can be found in [facility] 11/21. EP orientation scheduled for Tuesday, 12/7 at 9am. Completed 6MWT and gym orientation. Initial ITP created and sent for review to Dr. Emily Filbert, Medical Director. First full day of exercise!  Patient was oriented to gym and equipment including functions, settings, policies, and procedures.  Patient's individual exercise prescription and treatment plan were reviewed.  All starting workloads were established based on the results of the 6 minute walk test done at initial orientation visit.  The plan for exercise progression was also introduced and progression will be customized based on patient's performance and goals. 30 Day review completed. Medical Director ITP review done, changes made as directed, and signed approval by Medical Director.           Comments:

## 2020-01-08 ENCOUNTER — Other Ambulatory Visit: Payer: Self-pay

## 2020-01-08 ENCOUNTER — Encounter: Payer: Medicare PPO | Admitting: *Deleted

## 2020-01-08 DIAGNOSIS — I214 Non-ST elevation (NSTEMI) myocardial infarction: Secondary | ICD-10-CM | POA: Diagnosis not present

## 2020-01-08 NOTE — Progress Notes (Signed)
Daily Session Note  Patient Details  Name: SOSAIA PITTINGER MRN: [id number] Date of Birth: 05-14-1945 Referring Provider:   Flowsheet Row Cardiac Rehab from 12/16/2019 in [facility] Cardiac and Pulmonary Rehab  Referring Provider [name] MD      Encounter Date: 01/08/2020  Check In:  Session Check In - 01/08/20 0850      Check-In   Supervising physician immediately available to respond to emergencies See telemetry face sheet for immediately available ER MD    Location [facility]-Cardiac & Pulmonary Rehab    Staff Present Heath Lark, RN, BSN, Jacklynn Bue, MS Exercise Physiologist;Melissa Caiola RDN, LDN    Virtual Visit No    Medication changes reported     No    Fall or balance concerns reported    No    Current number of cigarettes/nicotine per day     2    Warm-up and Cool-down Performed on first and last piece of equipment    Resistance Training Performed Yes    VAD Patient? No    PAD/SET Patient? No      Pain Assessment   Currently in Pain? No/denies              Social History   Tobacco Use  Smoking Status Former Smoker  . Packs/day: 1.00  . Years: 56.00  . Pack years: 56.00  . Types: Cigarettes  . Start date: 1964  . Quit date: 11/30/2019  . Years since quitting: 0.1  Smokeless Tobacco Never Used  Tobacco Comment   Quit when he was admitted in the hospital 11/30/19    Goals Met:  Independence with exercise equipment Exercise tolerated well No report of cardiac concerns or symptoms  Goals Unmet:  Not Applicable  Comments: Pt able to follow exercise prescription today without complaint.  Will continue to monitor for progression.    Dr. Emily Filbert is Medical Director for [place] and [facility].

## 2020-01-12 ENCOUNTER — Other Ambulatory Visit: Payer: Self-pay

## 2020-01-12 ENCOUNTER — Encounter: Payer: Medicare PPO | Attending: Cardiovascular Disease

## 2020-01-12 DIAGNOSIS — G610 Guillain-Barre syndrome: Secondary | ICD-10-CM

## 2020-01-12 DIAGNOSIS — Z87891 Personal history of nicotine dependence: Secondary | ICD-10-CM | POA: Insufficient documentation

## 2020-01-12 DIAGNOSIS — I214 Non-ST elevation (NSTEMI) myocardial infarction: Secondary | ICD-10-CM | POA: Insufficient documentation

## 2020-01-12 MED ORDER — GABAPENTIN 100 MG PO CAPS: 100.0000 mg | ORAL_CAPSULE | Freq: Every day | ORAL | 1 refills | Status: DC

## 2020-01-12 NOTE — Progress Notes (Signed)
Completed initial RD Evaluation.  

## 2020-01-13 ENCOUNTER — Encounter: Payer: Medicare PPO | Admitting: *Deleted

## 2020-01-13 ENCOUNTER — Other Ambulatory Visit: Payer: Self-pay

## 2020-01-13 DIAGNOSIS — I214 Non-ST elevation (NSTEMI) myocardial infarction: Secondary | ICD-10-CM

## 2020-01-13 DIAGNOSIS — Z87891 Personal history of nicotine dependence: Secondary | ICD-10-CM | POA: Diagnosis not present

## 2020-01-13 NOTE — Progress Notes (Signed)
Daily Session Note  Patient Details  Name: Brent [name] MRN: [id number] Date of Birth: August 11, 1945 Referring Provider:   Flowsheet Row Cardiac Rehab from 12/16/2019 in [facility] Cardiac and Pulmonary Rehab  Referring Provider [name] MD      Encounter Date: 01/13/2020  Check In:  Session Check In - 01/13/20 0829      Check-In   Supervising physician immediately available to respond to emergencies See telemetry face sheet for immediately available ER MD    Location [facility]-Cardiac & Pulmonary Rehab    Staff Present Heath Lark, RN, BSN, Jacklynn Bue, MS Exercise Physiologist;Amanda Oletta Darter, [place], ACSM CEP, Exercise Physiologist    Virtual Visit No    Medication changes reported     No    Fall or balance concerns reported    No    Warm-up and Cool-down Performed on first and last piece of equipment    Resistance Training Performed Yes    VAD Patient? No    PAD/SET Patient? No      Pain Assessment   Currently in Pain? No/denies              Social History   Tobacco Use  Smoking Status Former Smoker  . Packs/day: 1.00  . Years: 56.00  . Pack years: 56.00  . Types: Cigarettes  . Start date: 1964  . Quit date: 11/30/2019  . Years since quitting: 0.1  Smokeless Tobacco Never Used  Tobacco Comment   Quit when he was admitted in the hospital 11/30/19    Goals Met:  Independence with exercise equipment Exercise tolerated well No report of cardiac concerns or symptoms  Goals Unmet:  Not Applicable  Comments: Pt able to follow exercise prescription today without complaint.  Will continue to monitor for progression.    Dr. Emily Filbert is Medical Director for [place] and [facility].

## 2020-01-13 NOTE — Progress Notes (Signed)
Daily Session Note  Patient Details  Name: MAGIC MOHLER MRN: [id number] Date of Birth: 05/01/1945 Referring Provider:   Flowsheet Row Cardiac Rehab from 12/16/2019 in [facility] Cardiac and Pulmonary Rehab  Referring Provider [name] MD      Encounter Date: 01/13/2020  Check In:      Social History   Tobacco Use  Smoking Status Former Smoker  . Packs/day: 1.00  . Years: 56.00  . Pack years: 56.00  . Types: Cigarettes  . Start date: 1964  . Quit date: 11/30/2019  . Years since quitting: 0.1  Smokeless Tobacco Never Used  Tobacco Comment   Quit when he was admitted in the hospital 11/30/19    Goals Met:  Independence with exercise equipment Exercise tolerated well Personal goals reviewed No report of cardiac concerns or symptoms  Goals Unmet:  Not Applicable  Comments: Reviewed home exercise with pt today.  Pt plans to walk and use weights for exercise.  Reviewed THR, pulse, RPE, sign and symptoms, pulse oximetery and when to call 911 or MD.  Also discussed weather considerations and indoor options.  Pt voiced understanding.    Dr. Emily Filbert is Medical Director for [place] and [facility].

## 2020-01-20 ENCOUNTER — Encounter: Payer: Medicare PPO | Admitting: *Deleted

## 2020-01-20 ENCOUNTER — Other Ambulatory Visit: Payer: Self-pay

## 2020-01-20 DIAGNOSIS — Z87891 Personal history of nicotine dependence: Secondary | ICD-10-CM | POA: Diagnosis not present

## 2020-01-20 DIAGNOSIS — I214 Non-ST elevation (NSTEMI) myocardial infarction: Secondary | ICD-10-CM | POA: Diagnosis not present

## 2020-01-20 NOTE — Progress Notes (Signed)
Daily Session Note  Patient Details  Name: Brent [name] MRN: [id number] Date of Birth: 12/23/1945 Referring Provider:   Flowsheet Row Cardiac Rehab from 12/16/2019 in [facility] Cardiac and Pulmonary Rehab  Referring Provider [name] MD      Encounter Date: 01/20/2020  Check In:  Session Check In - 01/20/20 0837      Check-In   Supervising physician immediately available to respond to emergencies See telemetry face sheet for immediately available ER MD    Location [facility]-Cardiac & Pulmonary Rehab    Staff Present Heath Lark, RN, BSN, Jacklynn Bue, MS Exercise Physiologist;Amanda Oletta Darter, [place], ACSM CEP, Exercise Physiologist    Virtual Visit No    Medication changes reported     No    Fall or balance concerns reported    No    Warm-up and Cool-down Performed on first and last piece of equipment    Resistance Training Performed Yes    VAD Patient? No    PAD/SET Patient? No      Pain Assessment   Currently in Pain? No/denies              Social History   Tobacco Use  Smoking Status Former Smoker  . Packs/day: 1.00  . Years: 56.00  . Pack years: 56.00  . Types: Cigarettes  . Start date: 1964  . Quit date: 11/30/2019  . Years since quitting: 0.1  Smokeless Tobacco Never Used  Tobacco Comment   Quit when he was admitted in the hospital 11/30/19    Goals Met:  Independence with exercise equipment Exercise tolerated well No report of cardiac concerns or symptoms  Goals Unmet:  Not Applicable  Comments: Pt able to follow exercise prescription today without complaint.  Will continue to monitor for progression.    Dr. Emily Filbert is Medical Director for [place] and [facility].

## 2020-01-22 ENCOUNTER — Other Ambulatory Visit: Payer: Self-pay

## 2020-01-22 DIAGNOSIS — Z87891 Personal history of nicotine dependence: Secondary | ICD-10-CM | POA: Diagnosis not present

## 2020-01-22 DIAGNOSIS — I214 Non-ST elevation (NSTEMI) myocardial infarction: Secondary | ICD-10-CM | POA: Diagnosis not present

## 2020-01-22 NOTE — Progress Notes (Signed)
Daily Session Note  Patient Details  Name: Brent [name] MRN: [id number] Date of Birth: 1945-07-12 Referring Provider:   Flowsheet Row Cardiac Rehab from 12/16/2019 in [facility] Cardiac and Pulmonary Rehab  Referring Provider [name] MD      Encounter Date: 01/22/2020  Check In:  Session Check In - 01/22/20 0745      Check-In   Supervising physician immediately available to respond to emergencies See telemetry face sheet for immediately available ER MD    Location [facility]-Cardiac & Pulmonary Rehab    Staff Present Birdie Sons, MPA, RN;Amanda Oletta Darter, BA, ACSM CEP, Exercise Physiologist;Kara Eliezer Bottom, MS Exercise Physiologist    Virtual Visit No    Medication changes reported     No    Fall or balance concerns reported    No    Tobacco Cessation Use Decreased    Current number of cigarettes/nicotine per day     0    Warm-up and Cool-down Performed on first and last piece of equipment    Resistance Training Performed Yes    VAD Patient? No    PAD/SET Patient? No      Pain Assessment   Currently in Pain? No/denies              Social History   Tobacco Use  Smoking Status Former Smoker  . Packs/day: 1.00  . Years: 56.00  . Pack years: 56.00  . Types: Cigarettes  . Start date: 1964  . Quit date: 11/30/2019  . Years since quitting: 0.1  Smokeless Tobacco Never Used  Tobacco Comment   Quit when he was admitted in the hospital 11/30/19    Goals Met:  Independence with exercise equipment Exercise tolerated well No report of cardiac concerns or symptoms Strength training completed today  Goals Unmet:  Not Applicable  Comments: Pt able to follow exercise prescription today without complaint.  Will continue to monitor for progression.    Dr. Emily Filbert is Medical Director for [facility] and [facility].

## 2020-01-23 ENCOUNTER — Other Ambulatory Visit: Payer: Self-pay

## 2020-01-23 MED ORDER — ATORVASTATIN CALCIUM 80 MG PO TABS
80.0000 mg | ORAL_TABLET | Freq: Every day | ORAL | 1 refills | Status: DC
Start: 2020-01-23 — End: 2020-06-15

## 2020-01-29 ENCOUNTER — Other Ambulatory Visit: Payer: Self-pay

## 2020-01-29 DIAGNOSIS — I214 Non-ST elevation (NSTEMI) myocardial infarction: Secondary | ICD-10-CM

## 2020-01-29 DIAGNOSIS — Z87891 Personal history of nicotine dependence: Secondary | ICD-10-CM | POA: Diagnosis not present

## 2020-01-29 NOTE — Progress Notes (Signed)
Daily Session Note  Patient Details  Name: SAYVION VIGEN MRN: [id number] Date of Birth: October 11, 1945 Referring Provider:   Flowsheet Row Cardiac Rehab from 12/16/2019 in [facility] Cardiac and Pulmonary Rehab  Referring Provider [name] MD      Encounter Date: 01/29/2020  Check In:  Session Check In - 01/29/20 0743      Check-In   Supervising physician immediately available to respond to emergencies See telemetry face sheet for immediately available ER MD    Location [facility]-Cardiac & Pulmonary Rehab    Staff Present Birdie Sons, MPA, RN;Melissa Caiola RDN, Rowe Pavy, BA, ACSM CEP, Exercise Physiologist    Virtual Visit No    Medication changes reported     No    Fall or balance concerns reported    No    Tobacco Cessation Use Decreased    Current number of cigarettes/nicotine per day     0    Warm-up and Cool-down Performed on first and last piece of equipment    Resistance Training Performed Yes    VAD Patient? No    PAD/SET Patient? No      Pain Assessment   Currently in Pain? No/denies              Social History   Tobacco Use  Smoking Status Former Smoker  . Packs/day: 1.00  . Years: 56.00  . Pack years: 56.00  . Types: Cigarettes  . Start date: 1964  . Quit date: 11/30/2019  . Years since quitting: 0.1  Smokeless Tobacco Never Used  Tobacco Comment   Quit when he was admitted in the hospital 11/30/19    Goals Met:  Independence with exercise equipment Exercise tolerated well No report of cardiac concerns or symptoms Strength training completed today  Goals Unmet:  Not Applicable  Comments: Pt able to follow exercise prescription today without complaint.  Will continue to monitor for progression.    Dr. Emily Filbert is Medical Director for [place] and [facility].

## 2020-02-03 ENCOUNTER — Other Ambulatory Visit: Payer: Self-pay

## 2020-02-03 ENCOUNTER — Encounter: Payer: Medicare PPO | Admitting: *Deleted

## 2020-02-03 DIAGNOSIS — Z87891 Personal history of nicotine dependence: Secondary | ICD-10-CM | POA: Diagnosis not present

## 2020-02-03 DIAGNOSIS — I214 Non-ST elevation (NSTEMI) myocardial infarction: Secondary | ICD-10-CM | POA: Diagnosis not present

## 2020-02-03 NOTE — Progress Notes (Signed)
Daily Session Note  Patient Details  Name: Brent [name] MRN: [id number] Date of Birth: Jun 24, 1945 Referring Provider:   Flowsheet Row Cardiac Rehab from 12/16/2019 in [facility] Cardiac and Pulmonary Rehab  Referring Provider Brent Sacramento MD      Encounter Date: 02/03/2020  Check In:  Session Check In - 02/03/20 0838      Check-In   Supervising physician immediately available to respond to emergencies See telemetry face sheet for immediately available ER MD    Location [facility]-Cardiac & Pulmonary Rehab    Staff Present Brent Lark, RN, BSN, Brent [name], BA, ACSM CEP, Exercise Physiologist;Brent Eliezer Bottom, MS Exercise Physiologist    Virtual Visit No    Medication changes reported     No    Fall or balance concerns reported    No    Warm-up and Cool-down Performed on first and last piece of equipment    Resistance Training Performed Yes    VAD Patient? No    PAD/SET Patient? No      Pain Assessment   Currently in Pain? No/denies              Social History   Tobacco Use  Smoking Status Former Smoker  . Packs/day: 1.00  . Years: 56.00  . Pack years: 56.00  . Types: Cigarettes  . Start date: 1964  . Quit date: 11/30/2019  . Years since quitting: 0.1  Smokeless Tobacco Never Used  Tobacco Comment   Quit when he was admitted in the hospital 11/30/19    Goals Met:  Independence with exercise equipment Exercise tolerated well No report of cardiac concerns or symptoms  Goals Unmet:  Not Applicable  Comments: Pt able to follow exercise prescription today without complaint.  Will continue to monitor for progression.    Dr. Emily [name] is Medical Director for [place] and [facility].

## 2020-02-04 ENCOUNTER — Encounter: Payer: Self-pay | Admitting: *Deleted

## 2020-02-04 DIAGNOSIS — I214 Non-ST elevation (NSTEMI) myocardial infarction: Secondary | ICD-10-CM

## 2020-02-04 NOTE — Progress Notes (Signed)
Cardiac Individual Treatment Plan  Patient Details  Name: Brent [name] MRN: [id number] Date of Birth: [birth year] Referring Provider:   Flowsheet Row Cardiac Rehab from 12/16/2019 in [facility] Cardiac and Pulmonary Rehab  Referring Provider [name] MD      Initial Encounter Date:  Flowsheet Row Cardiac Rehab from 12/16/2019 in [facility] Cardiac and Pulmonary Rehab  Date 12/16/19      Visit Diagnosis: NSTEMI (non-ST elevated myocardial infarction) [facility])  Patient's Home Medications on Admission:  Current Outpatient Medications:  .  acetaminophen (TYLENOL) 325 MG tablet, Take 650 mg by mouth every 6 (six) hours as needed. , Disp: , Rfl:  .  albuterol (VENTOLIN HFA) 108 (90 Base) MCG/ACT inhaler, Inhale 2 puffs into the lungs every 6 (six) hours as needed for wheezing or shortness of breath., Disp: 6.7 g, Rfl: 2 .  aspirin EC 81 MG EC tablet, Take 1 tablet (81 mg total) by mouth daily., Disp: , Rfl:  .  atorvastatin (LIPITOR) 80 MG tablet, Take 1 tablet (80 mg total) by mouth daily., Disp: 90 tablet, Rfl: 1 .  clopidogrel (PLAVIX) 75 MG tablet, Take 1 tablet (75 mg total) by mouth daily with breakfast., Disp: 30 tablet, Rfl: 11 .  gabapentin (NEURONTIN) 100 MG capsule, Take 1 capsule (100 mg total) by mouth daily., Disp: 90 capsule, Rfl: 1 .  metoprolol succinate (TOPROL-XL) 25 MG 24 hr tablet, Take 0.5 tablets (12.5 mg total) by mouth daily., Disp: 30 tablet, Rfl: 11 .  nitroGLYCERIN (NITROSTAT) 0.4 MG SL tablet, Place 1 tablet (0.4 mg total) under the tongue every 5 (five) minutes as needed for chest pain., Disp: 30 tablet, Rfl: 3  Past Medical History: Past Medical History:  Diagnosis Date  . Aortic calcification (HCC) 07/2015   Noted on chest CT with contrast  . Cataract   . Centrilobular emphysema ([place]) 08/03/2015   Noted on chest CT July 2017  . 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[place]) 07/29/2015  . Coronary artery calcification seen on CAT scan 07/29/2015   Noted on CT scan July 2017  .  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 Guillain Barr syndrome ([place])   . History of colonic polyps   . Hyperlipidemia   . Hypochloremia   . Osteopenia 08/29/2016   August 2018; next scan August 2020  . Right Supraclavicular Lymph node 08/03/2015  . Solitary pulmonary nodule 08/03/2015   CT Chest 07/2015 - Subpleural 3 mm left upper lobe pulmonary nodule, probably benign.  . Tobacco abuse     Tobacco Use: Social History   Tobacco Use  Smoking Status Former Smoker  . Packs/day: 1.00  . Years: 56.00  . Pack years: 56.00  . Types: Cigarettes  . Start date: 1964  . Quit date: 11/30/2019  . Years since quitting: 0.1  Smokeless Tobacco Never Used  Tobacco Comment   Quit when he was admitted in the hospital 11/30/19    Labs: Recent Review Flowsheet Data    Labs for ITP Cardiac and Pulmonary Rehab Latest Ref Rng & Units 03/27/2016 05/24/2017 10/28/2018 10/29/2019 12/01/2019   Cholestrol 0 - 200 mg/dL 162 164 187 140 111   LDLCALC 0 - 99 mg/dL 87 94 109(H) 61 57   HDL >40 mg/dL 48 51 59 65 49   Trlycerides <150 mg/dL 134 95 97 59 26   Hemoglobin A1c 4.2 - 6.3 % - - - - -       Exercise Target Goals: Exercise Program Goal: Individual exercise  prescription set using results from initial 6 min walk test and THRR while considering  patient's activity barriers and safety.   Exercise Prescription Goal: Initial exercise prescription builds to 30-45 minutes a day of aerobic activity, 2-3 days per week.  Home exercise guidelines will be given to patient during program as part of exercise prescription that the participant will acknowledge.   Education: Aerobic Exercise: - Group verbal and visual presentation on the components of exercise prescription. Introduces F.I.T.T principle from ACSM for exercise prescriptions.  Reviews F.I.T.T. principles of aerobic exercise including progression. Written material given at graduation. Flowsheet Row Cardiac Rehab from  01/29/2020 in [facility] Cardiac and Pulmonary Rehab  Education need identified 12/16/19  Date 01/08/20  Educator AS  Instruction Review Code 1- Verbalizes Understanding      Education: Resistance Exercise: - Group verbal and visual presentation on the components of exercise prescription. Introduces F.I.T.T principle from ACSM for exercise prescriptions  Reviews F.I.T.T. principles of resistance exercise including progression. Written material given at graduation. Flowsheet Row Cardiac Rehab from 01/29/2020 in [facility] Cardiac and Pulmonary Rehab  Education need identified 12/16/19       Education: Exercise & Equipment Safety: - Individual verbal instruction and demonstration of equipment use and safety with use of the equipment. Flowsheet Row Cardiac Rehab from 01/29/2020 in [facility] Cardiac and Pulmonary Rehab  Date 12/16/19  Educator [facility]  Instruction Review Code 1- Verbalizes Understanding      Education: Exercise Physiology & General Exercise Guidelines: - Group verbal and written instruction with models to review the exercise physiology of the cardiovascular system and associated critical values. Provides general exercise guidelines with specific guidelines to those with heart or lung disease.    Education: Flexibility, Balance, Mind/Body Relaxation: - Group verbal and visual presentation with interactive activity on the components of exercise prescription. Introduces F.I.T.T principle from ACSM for exercise prescriptions. Reviews F.I.T.T. principles of flexibility and balance exercise training including progression. Also discusses the mind body connection.  Reviews various relaxation techniques to help reduce and manage stress (i.e. Deep breathing, progressive muscle relaxation, and visualization). Balance handout provided to take home. Written material given at graduation.   Activity Barriers & Risk Stratification:  Activity Barriers & Cardiac Risk Stratification - 12/16/19 1300      Activity  Barriers & Cardiac Risk Stratification   Activity Barriers Back Problems;Deconditioning;Muscular Weakness;Shortness of Breath;Balance Concerns    Cardiac Risk Stratification High           6 Minute Walk:  6 Minute Walk    Row Name 12/16/19 1300         6 Minute Walk   Phase Initial     Distance 1120 feet     Walk Time 6 minutes     # of Rest Breaks 0     MPH 2.12     METS 3.29     RPE 12     Perceived Dyspnea  1     VO2 Peak 11.5     Symptoms Yes (comment)     Comments SOB     Resting HR 89 bpm     Resting BP 150/74     Resting Oxygen Saturation  97 %     Exercise Oxygen Saturation  during 6 min walk 98 %     Max Ex. HR 105 bpm     Max Ex. BP 154/62     2 Minute Post BP 132/74            Oxygen  Initial Assessment:   Oxygen Re-Evaluation:   Oxygen Discharge (Final Oxygen Re-Evaluation):   Initial Exercise Prescription:  Initial Exercise Prescription - 12/16/19 1300      Date of Initial Exercise RX and Referring Provider   Date 12/16/19    Referring Provider [name] MD      Treadmill   MPH 2    Grade 0.5    Minutes 15    METs 2.67      Recumbant Bike   Level 2    RPM 50    Watts 22    Minutes 15    METs 2.5      NuStep   Level 2    SPM 80    Minutes 15    METs 2.5      Recumbant Elliptical   Level 1    RPM 50    Minutes 15    METs 2.5      Prescription Details   Frequency (times per week) 2    Duration Progress to 30 minutes of continuous aerobic without signs/symptoms of physical distress      Intensity   THRR 40-80% of Max Heartrate 112-135    Ratings of Perceived Exertion 11-13    Perceived Dyspnea 0-4      Progression   Progression Continue to progress workloads to maintain intensity without signs/symptoms of physical distress.      Resistance Training   Training Prescription Yes    Weight 3 lb    Reps 10-15           Perform Capillary Blood Glucose checks as needed.  Exercise Prescription Changes:   Exercise Prescription Changes    Row Name 12/16/19 1300 12/29/19 1600 01/12/20 1200 01/26/20 1100       Response to Exercise   Blood Pressure (Admit) 150/74 98/52 112/60 120/58    Blood Pressure (Exercise) 154/62 112/56 118/54 120/60    Blood Pressure (Exit) 132/74 96/56 102/58 112/58    Heart Rate (Admit) 89 bpm 93 bpm 109 bpm 99 bpm    Heart Rate (Exercise) 105 bpm 102 bpm 127 bpm 127 bpm    Heart Rate (Exit) 90 bpm 88 bpm 103 bpm 105 bpm    Oxygen Saturation (Admit) 97 % -- -- --    Oxygen Saturation (Exercise) 98 % -- -- --    Rating of Perceived Exertion (Exercise) 12 10 13 14     Perceived Dyspnea (Exercise) 1 -- -- --    Symptoms SOB none none none    Comments walk test results first full day of exercise -- --    Duration -- Progress to 30 minutes of  aerobic without signs/symptoms of physical distress Continue with 30 min of aerobic exercise without signs/symptoms of physical distress. Continue with 30 min of aerobic exercise without signs/symptoms of physical distress.    Intensity -- THRR unchanged THRR unchanged THRR unchanged         Progression   Progression -- Continue to progress workloads to maintain intensity without signs/symptoms of physical distress. Continue to progress workloads to maintain intensity without signs/symptoms of physical distress. Continue to progress workloads to maintain intensity without signs/symptoms of physical distress.    Average METs -- 2.9 2.2 2.56         Resistance Training   Training Prescription -- Yes Yes Yes    Weight -- 3 lb 3 lb 5 lb    Reps -- 10-15 10-15 10-15         Interval  Training   Interval Training -- No No No         Treadmill   MPH -- -- 1.7 1.7    Grade -- -- 1 1    Minutes -- -- 15 15    METs -- -- 2.54 2.54         Recumbant Bike   Level -- 2 -- 2    Minutes -- 15 -- 15         NuStep   Level -- 2 -- 4    Minutes -- 15 -- 15    METs -- 2.9 -- 3         Recumbant Elliptical   Level -- -- 1 1     RPM -- -- 50 --    Minutes -- -- 15 15    METs -- -- 1.8 2.1           Exercise Comments:  Exercise Comments    Row Name 12/23/19 [phone]           Exercise Comments First full day of exercise!  Patient was oriented to gym and equipment including functions, settings, policies, and procedures.  Patient's individual exercise prescription and treatment plan were reviewed.  All starting workloads were established based on the results of the 6 minute walk test done at initial orientation visit.  The plan for exercise progression was also introduced and progression will be customized based on patient's performance and goals.              Exercise Goals and Review:  Exercise Goals    Row Name 12/16/19 1405             Exercise Goals   Increase Physical Activity Yes       Intervention Provide advice, education, support and counseling about physical activity/exercise needs.;Develop an individualized exercise prescription for aerobic and resistive training based on initial evaluation findings, risk stratification, comorbidities and participant's personal goals.       Expected Outcomes Short Term: Attend rehab on a regular basis to increase amount of physical activity.;Long Term: Add in home exercise to make exercise part of routine and to increase amount of physical activity.;Long Term: Exercising regularly at least 3-5 days a week.       Increase Strength and Stamina Yes       Intervention Provide advice, education, support and counseling about physical activity/exercise needs.;Develop an individualized exercise prescription for aerobic and resistive training based on initial evaluation findings, risk stratification, comorbidities and participant's personal goals.       Expected Outcomes Short Term: Increase workloads from initial exercise prescription for resistance, speed, and METs.;Short Term: Perform resistance training exercises routinely during rehab and add in resistance training at  home;Long Term: Improve cardiorespiratory fitness, muscular endurance and strength as measured by increased METs and functional capacity (6MWT)       Able to understand and use rate of perceived exertion (RPE) scale Yes       Intervention Provide education and explanation on how to use RPE scale       Expected Outcomes Short Term: Able to use RPE daily in rehab to express subjective intensity level;Long Term:  Able to use RPE to guide intensity level when exercising independently       Able to understand and use Dyspnea scale Yes       Intervention Provide education and explanation on how to use Dyspnea scale       Expected Outcomes Short Term:  Able to use Dyspnea scale daily in rehab to express subjective sense of shortness of breath during exertion;Long Term: Able to use Dyspnea scale to guide intensity level when exercising independently       Knowledge and understanding of Target Heart Rate Range (THRR) Yes       Intervention Provide education and explanation of THRR including how the numbers were predicted and where they are located for reference       Expected Outcomes Short Term: Able to state/look up THRR;Short Term: Able to use daily as guideline for intensity in rehab;Long Term: Able to use THRR to govern intensity when exercising independently       Able to check pulse independently Yes       Intervention Provide education and demonstration on how to check pulse in carotid and radial arteries.;Review the importance of being able to check your own pulse for safety during independent exercise       Expected Outcomes Short Term: Able to explain why pulse checking is important during independent exercise;Long Term: Able to check pulse independently and accurately       Understanding of Exercise Prescription Yes       Intervention Provide education, explanation, and written materials on patient's individual exercise prescription       Expected Outcomes Short Term: Able to explain program  exercise prescription;Long Term: Able to explain home exercise prescription to exercise independently              Exercise Goals Re-Evaluation :  Exercise Goals Re-Evaluation    Row Name 12/23/19 0843 12/29/19 1646 01/12/20 1223 01/13/20 0831 01/26/20 1104     Exercise Goal Re-Evaluation   Exercise Goals Review Able to understand and use rate of perceived exertion (RPE) scale;Knowledge and understanding of Target Heart Rate Range (THRR);Understanding of Exercise Prescription;Able to understand and use Dyspnea scale Increase Physical Activity;Increase Strength and Stamina;Understanding of Exercise Prescription Increase Physical Activity;Increase Strength and Stamina Increase Physical Activity;Increase Strength and Stamina Increase Physical Activity;Increase Strength and Stamina;Understanding of Exercise Prescription   Comments Reviewed RPE and dyspnea scales, THR and program prescription with pt today.  Pt voiced understanding and was given a copy of goals to take home. Arza has completed his first full day of exercise. He did well and we will continue to monitor his progress. L[place]lin continues to do well with exercise.  He attends consistently and works at [employer].  Staff will encourage working in [facility] range. Reviewed home exercise with pt today.  Pt plans to walk and use weights for exercise.  Reviewed THR, pulse, RPE, sign and symptoms, pulse oximetery and when to call 911 or MD.  Also discussed weather considerations and indoor options.  Pt voiced understanding. Kyere is doing well in rehab.  He is now up to level 4 on the NuStep.  We will continue to monitor his progress.   Expected Outcomes Short: Use RPE daily to regulate intensity. Long: Follow program prescription in THR. Short: Attend rehab regularly Long: Continue to follow program prescription Short: reach THR range during exercise Long:  improve overall stamina Short: Monitor vitals while exercising Long: Exercise independently at home  Short: Add speed back to treadmill Long; Continue to improve stamina          Discharge Exercise Prescription (Final Exercise Prescription Changes):  Exercise Prescription Changes - 01/26/20 1100      Response to Exercise   Blood Pressure (Admit) 120/58    Blood Pressure (Exercise) 120/60    Blood  Pressure (Exit) 112/58    Heart Rate (Admit) 99 bpm    Heart Rate (Exercise) 127 bpm    Heart Rate (Exit) 105 bpm    Rating of Perceived Exertion (Exercise) 14    Symptoms none    Duration Continue with 30 min of aerobic exercise without signs/symptoms of physical distress.    Intensity THRR unchanged      Progression   Progression Continue to progress workloads to maintain intensity without signs/symptoms of physical distress.    Average METs 2.56      Resistance Training   Training Prescription Yes    Weight 5 lb    Reps 10-15      Interval Training   Interval Training No      Treadmill   MPH 1.7    Grade 1    Minutes 15    METs 2.54      Recumbant Bike   Level 2    Minutes 15      NuStep   Level 4    Minutes 15    METs 3      Recumbant Elliptical   Level 1    Minutes 15    METs 2.1           Nutrition:  Target Goals: Understanding of nutrition guidelines, daily intake of sodium 1500mg , cholesterol 200mg , calories 30% from fat and 7% or less from saturated fats, daily to have 5 or more servings of fruits and vegetables.  Education: All About Nutrition: -Group instruction provided by verbal, written material, interactive activities, discussions, models, and posters to present general guidelines for heart healthy nutrition including fat, fiber, MyPlate, the role of sodium in heart healthy nutrition, utilization of the nutrition label, and utilization of this knowledge for meal planning. Follow up email sent as well. Written material given at graduation.   Biometrics:  Pre Biometrics - 12/16/19 1406      Pre Biometrics   Height 5' 8.9" (1.75 m)    Weight  119 lb (54 kg)    BMI (Calculated) 17.63    Single Leg Stand 8.7 seconds            Nutrition Therapy Plan and Nutrition Goals:  Nutrition Therapy & Goals - 01/12/20 0833      Nutrition Therapy   Diet High kcal/protein    Drug/Food Interactions Statins/Certain Fruits    Protein (specify units) 65g    Fiber 30 grams    Whole Grain Foods 3 servings    Saturated Fats 12 max. grams    Fruits and Vegetables 5 servings/day    Sodium 1.5 grams      Personal Nutrition Goals   Nutrition Goal ST: add peanut butter to breakfast, add another snack or boost/ensure, eat protein foods first, add fat to meals already eating LT: meet nutritional needs    Comments Othal would like to improve his strength and his appetite - lower than his baseline: he rarely gets hungry. B: 11am: oatmeal (sugar, raisins or banana, and whole milk), sausage  D: 6pm: chicken, pork chops, greens, potatoes, red meat 3x/week.  S: peanut butter crackers, potato chips Drinks: coffee - cream and sugar, gatoraide, and water. He eats a couple of tablespoons of food at each of your meals, he was eating a plateful of food at his meals. He reports in the last couple of months his appetite changed - he does not know what caused it, but he believes stress has something to do with it. He  reports his weight is now stable (2-3 lbs up and down). Discussed high kcal/high protein nutrition therapy and briefly reviewed heart healthy eating - will focus more when his intake is adequate. RD in November reported he had severe malnutrition.      Intervention Plan   Intervention Prescribe, educate and counsel regarding individualized specific dietary modifications aiming towards targeted core components such as weight, hypertension, lipid management, diabetes, heart failure and other comorbidities.;Nutrition handout(s) given to patient.    Expected Outcomes Short Term Goal: Understand basic principles of dietary content, such as calories, fat,  sodium, cholesterol and nutrients.;Short Term Goal: A plan has been developed with personal nutrition goals set during dietitian appointment.;Long Term Goal: Adherence to prescribed nutrition plan.           Nutrition Assessments:  MEDIFICTS Score Key:  ?70 Need to make dietary changes   40-70 Heart Healthy Diet  ? 40 Therapeutic Level Cholesterol Diet  Flowsheet Row Cardiac Rehab from 12/16/2019 in [facility] Cardiac and Pulmonary Rehab  Picture Your Plate Total Score on Admission 45     Picture Your Plate Scores:  [id number] Unhealthy dietary pattern with much room for improvement.  41-50 Dietary pattern unlikely to meet recommendations for good health and room for improvement.  51-60 More healthful dietary pattern, with some room for improvement.   >60 Healthy dietary pattern, although there may be some specific behaviors that could be improved.    Nutrition Goals Re-Evaluation:   Nutrition Goals Discharge (Final Nutrition Goals Re-Evaluation):   Psychosocial: Target Goals: Acknowledge presence or absence of significant depression and/or stress, maximize coping skills, provide positive support system. Participant is able to verbalize types and ability to use techniques and skills needed for reducing stress and depression.   Education: Stress, Anxiety, and Depression - Group verbal and visual presentation to define topics covered.  Reviews how body is impacted by stress, anxiety, and depression.  Also discusses healthy ways to reduce stress and to treat/manage anxiety and depression.  Written material given at graduation.   Education: Sleep Hygiene -Provides group verbal and written instruction about how sleep can affect your health.  Define sleep hygiene, discuss sleep cycles and impact of sleep habits. Review good sleep hygiene tips.    Initial Review & Psychosocial Screening:  Initial Psych Review & Screening - 12/12/19 1413      Initial Review   Current issues with Current  Stress Concerns    Comments Quit smoking      Family Dynamics   Good Support System? Yes      Barriers   Psychosocial barriers to participate in program There are no identifiable barriers or psychosocial needs.;The patient should benefit from training in stress management and relaxation.      Screening Interventions   Interventions Encouraged to exercise;To provide support and resources with identified psychosocial needs;Provide feedback about the scores to participant    Expected Outcomes Short Term goal: Utilizing psychosocial counselor, staff and physician to assist with identification of specific Stressors or current issues interfering with healing process. Setting desired goal for each stressor or current issue identified.;Long Term Goal: Stressors or current issues are controlled or eliminated.;Short Term goal: Identification and review with participant of any Quality of Life or Depression concerns found by scoring the questionnaire.;Long Term goal: The participant improves quality of Life and PHQ9 Scores as seen by post scores and/or verbalization of changes           Quality of Life Scores:   Quality of Life -  12/16/19 1407      Quality of Life   Select Quality of Life      Quality of Life Scores   Health/Function Pre 20.4 %    Socioeconomic Pre 19 %    Psych/Spiritual Pre 23.14 %    Family Pre 26.4 %    GLOBAL Pre 21.64 %          Scores of 19 and below usually indicate a poorer quality of life in these areas.  A difference of  2-3 points is a clinically meaningful difference.  A difference of 2-3 points in the total score of the Quality of Life Index has been associated with significant improvement in overall quality of life, self-image, physical symptoms, and general health in studies assessing change in quality of life.  PHQ-9: Recent Review Flowsheet Data    Depression screen [facility] 2/9 01/29/2020 12/16/2019 10/29/2019 05/20/2019 01/28/2019   Decreased Interest 0 0 2 0 1    Down, Depressed, Hopeless 0 0 2 0 1   PHQ - 2 Score 0 0 4 0 2   Altered sleeping 0 1 3 - 0   Tired, decreased energy 0 1 3 - 1   Change in appetite 0 0 3 - 1   Feeling bad or failure about yourself  0 1 3 - 1   Trouble concentrating 0 0 0 - 0   Moving slowly or fidgety/restless 0 1 2 - 0   Suicidal thoughts 0 1  2 - 0   PHQ-9 Score 0 5 20 - 5   Difficult doing work/chores Not difficult at all Somewhat difficult Somewhat difficult - Not difficult at all     Interpretation of Total Score  Total Score Depression Severity:  1-4 = Minimal depression, 5-9 = Mild depression, 10-14 = Moderate depression, 15-19 = Moderately severe depression, 20-27 = Severe depression   Psychosocial Evaluation and Intervention:  Psychosocial Evaluation - 12/12/19 1413      Psychosocial Evaluation & Interventions   Comments Caide reports doing well post NSTEMI. He did quit smoking when he was admitted on 11/21. He states it has been a struggle but he's making it work. He was made aware about access to resources to continue on this journey of quitting smoking. He wants to get "overall better health" and hopes to achieve that by coming to cardiac rehab.    Expected Outcomes Short: attend cardiac rehab for education and exercise. Long: develop positive self care habits.    Continue Psychosocial Services  Follow up required by staff           Psychosocial Re-Evaluation:  Psychosocial Re-Evaluation    Row Name 01/13/20 0824 01/29/20 0853           Psychosocial Re-Evaluation   Current issues with Current Stress Concerns Current Depression      Comments Izan is still working on quitting smoking.  He reports no signs of anxiety or depression.  He doesnt sleep well.  He has trouble staying asleep.  Staff recommended he talk to his Dr.  and use breathing, music or reading to help fall asleep. Rustin is doing well in rehab.  His repeat PHQ is down to 0 now. He attributes it to getting out more and feeling  better.      Expected Outcomes Short: talk to Dr about sleep Long: develop better sleep patterns Short: Continue to get back to exercise Long: Continue feel better.      Interventions -- Encouraged to attend Cardiac  Rehabilitation for the exercise      Continue Psychosocial Services  -- Follow up required by staff             Psychosocial Discharge (Final Psychosocial Re-Evaluation):  Psychosocial Re-Evaluation - 01/29/20 0853      Psychosocial Re-Evaluation   Current issues with Current Depression    Comments Doc is doing well in rehab.  His repeat PHQ is down to 0 now. He attributes it to getting out more and feeling better.    Expected Outcomes Short: Continue to get back to exercise Long: Continue feel better.    Interventions Encouraged to attend Cardiac Rehabilitation for the exercise    Continue Psychosocial Services  Follow up required by staff           Vocational Rehabilitation: Provide vocational rehab assistance to qualifying candidates.   Vocational Rehab Evaluation & Intervention:  Vocational Rehab - 12/12/19 1413      Initial Vocational Rehab Evaluation & Intervention   Assessment shows need for Vocational Rehabilitation No           Education: Education Goals: Education classes will be provided on a variety of topics geared toward better understanding of heart health and risk factor modification. Participant will state understanding/return demonstration of topics presented as noted by education test scores.  Learning Barriers/Preferences:  Learning Barriers/Preferences - 12/12/19 1413      Learning Barriers/Preferences   Learning Barriers None    Learning Preferences None           General Cardiac Education Topics:  AED/CPR: - Group verbal and written instruction with the use of models to demonstrate the basic use of the AED with the basic ABC's of resuscitation.   Anatomy and Cardiac Procedures: - Group verbal and visual presentation and  models provide information about basic cardiac anatomy and function. Reviews the testing methods done to diagnose heart disease and the outcomes of the test results. Describes the treatment choices: Medical Management, Angioplasty, or Coronary Bypass Surgery for treating various heart conditions including Myocardial Infarction, Angina, Valve Disease, and Cardiac Arrhythmias.  Written material given at graduation.   Medication Safety: - Group verbal and visual instruction to review commonly prescribed medications for heart and lung disease. Reviews the medication, class of the drug, and side effects. Includes the steps to properly store meds and maintain the prescription regimen.  Written material given at graduation. Flowsheet Row Cardiac Rehab from 01/29/2020 in [facility] Cardiac and Pulmonary Rehab  Date 01/29/20  Educator SB  Instruction Review Code 1- Verbalizes Understanding      Intimacy: - Group verbal instruction through game format to discuss how heart and lung disease can affect sexual intimacy. Written material given at graduation.. Flowsheet Row Cardiac Rehab from 01/29/2020 in [facility] Cardiac and Pulmonary Rehab  Date 01/08/20  Educator as  Instruction Review Code 1- Verbalizes Understanding      Know Your Numbers and Heart Failure: - Group verbal and visual instruction to discuss disease risk factors for cardiac and pulmonary disease and treatment options.  Reviews associated critical values for Overweight/Obesity, Hypertension, Cholesterol, and Diabetes.  Discusses basics of heart failure: signs/symptoms and treatments.  Introduces Heart Failure Zone chart for action plan for heart failure.  Written material given at graduation.   Infection Prevention: - Provides verbal and written material to individual with discussion of infection control including proper hand washing and proper equipment cleaning during exercise session. Flowsheet Row Cardiac Rehab from 01/29/2020 in [facility] Cardiac  and Pulmonary Rehab  Date 12/16/19  Educator [facility]  Instruction Review Code 1- Verbalizes Understanding      Falls Prevention: - Provides verbal and written material to individual with discussion of falls prevention and safety. Flowsheet Row Cardiac Rehab from 01/29/2020 in [facility] Cardiac and Pulmonary Rehab  Date 12/16/19  Educator [facility]  Instruction Review Code 1- Verbalizes Understanding      Other: -Provides group and verbal instruction on various topics (see comments)   Knowledge Questionnaire Score:  Knowledge Questionnaire Score - 12/16/19 1408      Knowledge Questionnaire Score   Pre Score 24/26 Education focus: exercise           Core Components/Risk Factors/Patient Goals at Admission:  Personal Goals and Risk Factors at Admission - 12/16/19 1409      Core Components/Risk Factors/Patient Goals on Admission    Weight Management Yes;Weight Gain    Intervention Weight Management: Develop a combined nutrition and exercise program designed to reach desired caloric intake, while maintaining appropriate intake of nutrient and fiber, sodium and fats, and appropriate energy expenditure required for the weight goal.;Weight Management: Provide education and appropriate resources to help participant work on and attain dietary goals.    Admit Weight 119 lb (54 kg)    Goal Weight: Short Term 122 lb (55.3 kg)    Goal Weight: Long Term 125 lb (56.7 kg)    Expected Outcomes Short Term: Continue to assess and modify interventions until short term weight is achieved;Long Term: Adherence to nutrition and physical activity/exercise program aimed toward attainment of established weight goal;Weight Gain: Understanding of general recommendations for a high calorie, high protein meal plan that promotes weight gain by distributing calorie intake throughout the day with the consumption for 4-5 meals, snacks, and/or supplements    Tobacco Cessation Yes    Intervention Assist the participant in steps to  quit. Provide individualized education and counseling about committing to Tobacco Cessation, relapse prevention, and pharmacological support that can be provided by physician.;Advice worker, assist with locating and accessing local/national Quit Smoking programs, and support quit date choice.    Expected Outcomes Short Term: Will quit all tobacco product use, adhering to prevention of relapse plan.;Long Term: Complete abstinence from all tobacco products for at least 12 months from quit date.    Hypertension Yes    Intervention Provide education on lifestyle modifcations including regular physical activity/exercise, weight management, moderate sodium restriction and increased consumption of fresh fruit, vegetables, and low fat dairy, alcohol moderation, and smoking cessation.;Monitor prescription use compliance.    Expected Outcomes Long Term: Maintenance of blood pressure at goal levels.;Short Term: Continued assessment and intervention until BP is < 140/18mm HG in hypertensive participants. < 130/60mm HG in hypertensive participants with diabetes, heart failure or chronic kidney disease.    Lipids Yes    Intervention Provide education and support for participant on nutrition & aerobic/resistive exercise along with prescribed medications to achieve LDL 70mg , HDL >40mg .    Expected Outcomes Short Term: Participant states understanding of desired cholesterol values and is compliant with medications prescribed. Participant is following exercise prescription and nutrition guidelines.;Long Term: Cholesterol controlled with medications as prescribed, with individualized exercise RX and with personalized nutrition plan. Value goals: LDL < 70mg , HDL > 40 mg.           Education:Diabetes - Individual verbal and written instruction to review signs/symptoms of diabetes, desired ranges of glucose level fasting, after meals and with exercise. Acknowledge that pre and post exercise glucose checks  will be  done for 3 sessions at entry of program.   Core Components/Risk Factors/Patient Goals Review:   Goals and Risk Factor Review    Row Name 01/13/20 0820             Core Components/Risk Factors/Patient Goals Review   Personal Goals Review Hypertension;Tobacco Cessation       Review Nial does plan to quit smoking.  He is down to about 3 cigarettes per day.  he does have Quit [employer].  He is trying to quit cold [place].  He says his wife is his "quit coach" at home.  he takes meds as directed most of the time - he missed yesterday due to power outage.       Expected Outcomes Short: set another quit date Long: quit smoking altogether              Core Components/Risk Factors/Patient Goals at Discharge (Final Review):   Goals and Risk Factor Review - 01/13/20 0820      Core Components/Risk Factors/Patient Goals Review   Personal Goals Review Hypertension;Tobacco Cessation    Review Ronyn does plan to quit smoking.  He is down to about 3 cigarettes per day.  he does have Quit [employer].  He is trying to quit cold [place].  He says his wife is his "quit coach" at home.  he takes meds as directed most of the time - he missed yesterday due to power outage.    Expected Outcomes Short: set another quit date Long: quit smoking altogether           ITP Comments:  ITP Comments    Row Name 12/12/19 1403 12/16/19 1149 12/23/19 0842 01/07/20 0731 01/12/20 0858   ITP Comments Initial orientation completed. Diagnosis can be found in [facility] 11/21. EP orientation scheduled for Tuesday, 12/7 at 9am. Completed 6MWT and gym orientation. Initial ITP created and sent for review to Dr. Emily Filbert, Medical Director. First full day of exercise!  Patient was oriented to gym and equipment including functions, settings, policies, and procedures.  Patient's individual exercise prescription and treatment plan were reviewed.  All starting workloads were established based on the results of the  6 minute walk test done at initial orientation visit.  The plan for exercise progression was also introduced and progression will be customized based on patient's performance and goals. 30 Day review completed. Medical Director ITP review done, changes made as directed, and signed approval by Medical Director. Completed initial RD Evaluation   Row Name 02/04/20 1002           ITP Comments 30 Day review completed. Medical Director ITP review done, changes made as directed, and signed approval by Medical Director.              Comments:

## 2020-02-05 ENCOUNTER — Other Ambulatory Visit: Payer: Self-pay

## 2020-02-05 DIAGNOSIS — Z87891 Personal history of nicotine dependence: Secondary | ICD-10-CM | POA: Diagnosis not present

## 2020-02-05 DIAGNOSIS — I214 Non-ST elevation (NSTEMI) myocardial infarction: Secondary | ICD-10-CM

## 2020-02-05 NOTE — Progress Notes (Signed)
Daily Session Note  Patient Details  Name: Brent [name] MRN: [id number] Date of Birth: 1945-12-05 Referring Provider:   Flowsheet Row Cardiac Rehab from 12/16/2019 in [facility] Cardiac and Pulmonary Rehab  Referring Provider [name] MD      Encounter Date: 02/05/2020  Check In:  Session Check In - 02/05/20 0742      Check-In   Supervising physician immediately available to respond to emergencies See telemetry face sheet for immediately available ER MD    Location [facility]-Cardiac & Pulmonary Rehab    Staff Present Birdie Sons, MPA, RN;Laureen Owens Shark, BS, RRT, CPFT;Kara Eliezer Bottom, MS Exercise Physiologist    Virtual Visit No    Medication changes reported     No    Fall or balance concerns reported    No    Tobacco Cessation No Change    Warm-up and Cool-down Performed on first and last piece of equipment    Resistance Training Performed Yes    VAD Patient? No    PAD/SET Patient? No      Pain Assessment   Currently in Pain? No/denies              Social History   Tobacco Use  Smoking Status Former Smoker  . Packs/day: 1.00  . Years: 56.00  . Pack years: 56.00  . Types: Cigarettes  . Start date: 1964  . Quit date: 11/30/2019  . Years since quitting: 0.1  Smokeless Tobacco Never Used  Tobacco Comment   Quit when he was admitted in the hospital 11/30/19    Goals Met:  Independence with exercise equipment Exercise tolerated well No report of cardiac concerns or symptoms Strength training completed today  Goals Unmet:  Not Applicable  Comments: Pt able to follow exercise prescription today without complaint.  Will continue to monitor for progression.    Dr. Emily Filbert is Medical Director for [place] and [facility].

## 2020-02-10 ENCOUNTER — Encounter: Payer: Medicare PPO | Attending: Cardiovascular Disease | Admitting: *Deleted

## 2020-02-10 ENCOUNTER — Other Ambulatory Visit: Payer: Self-pay

## 2020-02-10 DIAGNOSIS — I252 Old myocardial infarction: Secondary | ICD-10-CM | POA: Diagnosis not present

## 2020-02-10 DIAGNOSIS — I214 Non-ST elevation (NSTEMI) myocardial infarction: Secondary | ICD-10-CM

## 2020-02-10 NOTE — Progress Notes (Signed)
Daily Session Note  Patient Details  Name: Brent [name] MRN: [id number] Date of Birth: [birth year] Referring Provider:   Flowsheet Row Cardiac Rehab from 12/16/2019 in [facility] Cardiac and Pulmonary Rehab  Referring Provider [name] MD      Encounter Date: 02/10/2020  Check In:  Session Check In - 02/10/20 1031      Check-In   Supervising physician immediately available to respond to emergencies See telemetry face sheet for immediately available ER MD    Location [facility]-Cardiac & Pulmonary Rehab    Staff Present Heath Lark, RN, BSN, Dorris Singh, MPA, Nino Glow, MS Exercise Physiologist    Virtual Visit No    Medication changes reported     No    Warm-up and Cool-down Performed on first and last piece of equipment    Resistance Training Performed Yes    VAD Patient? No    PAD/SET Patient? No      Pain Assessment   Currently in Pain? No/denies              Social History   Tobacco Use  Smoking Status Current Every Day Smoker  . Packs/day: 0.15  . Years: 56.00  . Pack years: 8.40  . Types: Cigarettes  . Start date: 1964  Smokeless Tobacco Never Used  Tobacco Comment   New quit date 02/24/20 - made 02/05/20    Goals Met:  Independence with exercise equipment Exercise tolerated well No report of cardiac concerns or symptoms  Goals Unmet:  Not Applicable  Comments: Pt able to follow exercise prescription today without complaint.  Will continue to monitor for progression.    Dr. Emily Filbert is Medical Director for [place] and [facility].

## 2020-02-12 ENCOUNTER — Other Ambulatory Visit: Payer: Self-pay

## 2020-02-12 DIAGNOSIS — I252 Old myocardial infarction: Secondary | ICD-10-CM | POA: Diagnosis not present

## 2020-02-12 DIAGNOSIS — I214 Non-ST elevation (NSTEMI) myocardial infarction: Secondary | ICD-10-CM

## 2020-02-12 NOTE — Progress Notes (Signed)
Daily Session Note  Patient Details  Name: Brent [name] MRN: [id number] Date of Birth: January 02, 1946 Referring Provider:   Flowsheet Row Cardiac Rehab from 12/16/2019 in [facility] Cardiac and Pulmonary Rehab  Referring Provider [name] MD      Encounter Date: 02/12/2020  Check In:  Session Check In - 02/12/20 0743      Check-In   Supervising physician immediately available to respond to emergencies See telemetry face sheet for immediately available ER MD    Location [facility]-Cardiac & Pulmonary Rehab    Staff Present Birdie Sons, MPA, RN;Melissa Caiola RDN, Rowe Pavy, BA, ACSM CEP, Exercise Physiologist    Virtual Visit No    Medication changes reported     No    Fall or balance concerns reported    No    Tobacco Cessation Use Increase    Current number of cigarettes/nicotine per day     3    Warm-up and Cool-down Performed on first and last piece of equipment    Resistance Training Performed Yes    VAD Patient? No    PAD/SET Patient? No      Pain Assessment   Currently in Pain? No/denies              Social History   Tobacco Use  Smoking Status Current Every Day Smoker  . Packs/day: 0.15  . Years: 56.00  . Pack years: 8.40  . Types: Cigarettes  . Start date: 1964  Smokeless Tobacco Never Used  Tobacco Comment   New quit date 02/24/20 - made 02/05/20    Goals Met:  Independence with exercise equipment Exercise tolerated well No report of cardiac concerns or symptoms Strength training completed today  Goals Unmet:  Not Applicable  Comments: Pt able to follow exercise prescription today without complaint.  Will continue to monitor for progression.    Dr. Emily Filbert is Medical Director for [place] and [facility].

## 2020-02-17 ENCOUNTER — Encounter: Payer: Medicare PPO | Admitting: *Deleted

## 2020-02-17 ENCOUNTER — Other Ambulatory Visit: Payer: Self-pay

## 2020-02-17 DIAGNOSIS — I252 Old myocardial infarction: Secondary | ICD-10-CM | POA: Diagnosis not present

## 2020-02-17 DIAGNOSIS — I214 Non-ST elevation (NSTEMI) myocardial infarction: Secondary | ICD-10-CM

## 2020-02-17 NOTE — Progress Notes (Signed)
Daily Session Note  Patient Details  Name: Brent [name] MRN: [id number] Date of Birth: October 23, 1945 Referring Provider:   Flowsheet Row Cardiac Rehab from 12/16/2019 in [facility] Cardiac and Pulmonary Rehab  Referring Provider [name] MD      Encounter Date: 02/17/2020  Check In:  Session Check In - 02/17/20 0931      Check-In   Supervising physician immediately available to respond to emergencies See telemetry face sheet for immediately available ER MD    Location [facility]-Cardiac & Pulmonary Rehab    Staff Present Carson Myrtle, BS, RRT, CPFT;Kara Eliezer Bottom, MS Exercise Physiologist;Amanda Oletta Darter, [place], ACSM CEP, Exercise Physiologist    Virtual Visit No    Medication changes reported     No    Fall or balance concerns reported    No    Current number of cigarettes/nicotine per day     2   Hopes to Quit on 2/14 a week from now.   Warm-up and Cool-down Performed on first and last piece of equipment    Resistance Training Performed Yes    VAD Patient? No    PAD/SET Patient? No      Pain Assessment   Currently in Pain? No/denies              Social History   Tobacco Use  Smoking Status Current Every Day Smoker  . Packs/day: 0.15  . Years: 56.00  . Pack years: 8.40  . Types: Cigarettes  . Start date: 1964  Smokeless Tobacco Never Used  Tobacco Comment   New quit date 02/24/20 - made 02/05/20    Goals Met:  Independence with exercise equipment Exercise tolerated well No report of cardiac concerns or symptoms  Goals Unmet:  Not Applicable  Comments: Pt able to follow exercise prescription today without complaint.  Will continue to monitor for progression.    Dr. Emily Filbert is Medical Director for [place] and [facility].

## 2020-02-19 ENCOUNTER — Other Ambulatory Visit: Payer: Self-pay

## 2020-02-19 DIAGNOSIS — I252 Old myocardial infarction: Secondary | ICD-10-CM | POA: Diagnosis not present

## 2020-02-19 DIAGNOSIS — I214 Non-ST elevation (NSTEMI) myocardial infarction: Secondary | ICD-10-CM

## 2020-02-19 NOTE — Progress Notes (Signed)
Daily Session Note  Patient Details  Name: CALDWELL KRONENBERGER MRN: [id number] Date of Birth: 1945-03-02 Referring Provider:   Flowsheet Row Cardiac Rehab from 12/16/2019 in [facility] Cardiac and Pulmonary Rehab  Referring Provider [name] MD      Encounter Date: 02/19/2020  Check In:  Session Check In - 02/19/20 0750      Check-In   Supervising physician immediately available to respond to emergencies See telemetry face sheet for immediately available ER MD    Location [facility]-Cardiac & Pulmonary Rehab    Staff Present Birdie Sons, MPA, RN;Melissa Caiola RDN, Rowe Pavy, BA, ACSM CEP, Exercise Physiologist    Virtual Visit No    Medication changes reported     No    Fall or balance concerns reported    No    Tobacco Cessation Use Increase    Current number of cigarettes/nicotine per day     4   says he's smoking 3-4 cigarettes a day   Warm-up and Cool-down Performed on first and last piece of equipment    Resistance Training Performed Yes    VAD Patient? No    PAD/SET Patient? No      Pain Assessment   Currently in Pain? No/denies              Social History   Tobacco Use  Smoking Status Current Every Day Smoker  . Packs/day: 0.15  . Years: 56.00  . Pack years: 8.40  . Types: Cigarettes  . Start date: 1964  Smokeless Tobacco Never Used  Tobacco Comment   New quit date 02/24/20 - made 02/05/20    Goals Met:  Independence with exercise equipment Exercise tolerated well No report of cardiac concerns or symptoms Strength training completed today  Goals Unmet:  Not Applicable  Comments: Pt able to follow exercise prescription today without complaint.  Will continue to monitor for progression.    Dr. Emily Filbert is Medical Director for [place] and [facility].

## 2020-02-24 ENCOUNTER — Other Ambulatory Visit: Payer: Self-pay

## 2020-02-24 ENCOUNTER — Encounter: Payer: Medicare PPO | Admitting: *Deleted

## 2020-02-24 DIAGNOSIS — I252 Old myocardial infarction: Secondary | ICD-10-CM | POA: Diagnosis not present

## 2020-02-24 DIAGNOSIS — I214 Non-ST elevation (NSTEMI) myocardial infarction: Secondary | ICD-10-CM

## 2020-02-24 NOTE — Progress Notes (Signed)
Daily Session Note  Patient Details  Name: Brent [name] MRN: [id number] Date of Birth: 08/07/1945 Referring Provider:   Flowsheet Row Cardiac Rehab from 12/16/2019 in [facility] Cardiac and Pulmonary Rehab  Referring Provider Arida, Muhammad MD      Encounter Date: 02/24/2020  Check In:  Session Check In - 02/24/20 0837      Check-In   Supervising physician immediately available to respond to emergencies See telemetry face sheet for immediately available ER MD    Location [facility]-Cardiac & Pulmonary Rehab    Staff Present  , RN, BSN, CCRP;Amanda Sommer, BA, ACSM CEP, Exercise Physiologist;Kara Langdon, MS Exercise Physiologist    Virtual Visit No    Medication changes reported     No    Fall or balance concerns reported    No    Tobacco Cessation Use Decreased    Current number of cigarettes/nicotine per day     2    Warm-up and Cool-down Performed on first and last piece of equipment    Resistance Training Performed Yes    VAD Patient? No    PAD/SET Patient? No      Pain Assessment   Currently in Pain? No/denies              Social History   Tobacco Use  Smoking Status Current Every Day Smoker  . Packs/day: 0.15  . Years: 56.00  . Pack years: 8.40  . Types: Cigarettes  . Start date: 1964  Smokeless Tobacco Never Used  Tobacco Comment   New quit date 02/24/20 - made 02/05/20    Goals Met:  Independence with exercise equipment Exercise tolerated well No report of cardiac concerns or symptoms  Goals Unmet:  Not Applicable  Comments: Pt able to follow exercise prescription today without complaint.  Will continue to monitor for progression.    Dr. Mark Miller is Medical Director for [facility] and [facility]. 

## 2020-02-26 ENCOUNTER — Other Ambulatory Visit: Payer: Self-pay

## 2020-02-26 DIAGNOSIS — I252 Old myocardial infarction: Secondary | ICD-10-CM | POA: Diagnosis not present

## 2020-02-26 DIAGNOSIS — I214 Non-ST elevation (NSTEMI) myocardial infarction: Secondary | ICD-10-CM

## 2020-02-26 NOTE — Progress Notes (Signed)
Daily Session Note  Patient Details  Name: Brent [name] MRN: [id number] Date of Birth: 04/06/1945 Referring Provider:   Flowsheet Row Cardiac Rehab from 12/16/2019 in [facility] Cardiac and Pulmonary Rehab  Referring Provider Arida, Muhammad MD      Encounter Date: 02/26/2020  Check In:  Session Check In - 02/26/20 0747      Check-In   Supervising physician immediately available to respond to emergencies See telemetry face sheet for immediately available ER MD    Location [facility]-Cardiac & Pulmonary Rehab    Staff Present  , MPA, RN;Melissa Caiola RDN, LDN;Amanda Sommer, BA, ACSM CEP, Exercise Physiologist    Virtual Visit No    Medication changes reported     No    Fall or balance concerns reported    No    Tobacco Cessation No Change    Current number of cigarettes/nicotine per day     2    Warm-up and Cool-down Performed on first and last piece of equipment    Resistance Training Performed Yes    VAD Patient? No    PAD/SET Patient? No      Pain Assessment   Currently in Pain? No/denies              Social History   Tobacco Use  Smoking Status Current Every Day Smoker  . Packs/day: 0.15  . Years: 56.00  . Pack years: 8.40  . Types: Cigarettes  . Start date: 1964  Smokeless Tobacco Never Used  Tobacco Comment   New quit date 02/24/20 - made 02/05/20    Goals Met:  Independence with exercise equipment Exercise tolerated well No report of cardiac concerns or symptoms Strength training completed today  Goals Unmet:  Not Applicable  Comments: Pt able to follow exercise prescription today without complaint.  Will continue to monitor for progression.    Dr. Mark Miller is Medical Director for [facility] and [facility]. 

## 2020-03-02 ENCOUNTER — Other Ambulatory Visit: Payer: Self-pay

## 2020-03-02 DIAGNOSIS — I252 Old myocardial infarction: Secondary | ICD-10-CM | POA: Diagnosis not present

## 2020-03-02 DIAGNOSIS — I214 Non-ST elevation (NSTEMI) myocardial infarction: Secondary | ICD-10-CM

## 2020-03-02 NOTE — Progress Notes (Signed)
Daily Session Note  Patient Details  Name: Brent [name] MRN: [id number] Date of Birth: [birth year] Referring Provider:   Flowsheet Row Cardiac Rehab from 12/16/2019 in [facility]  Referring Provider [name] MD      Encounter Date: 03/02/2020  Check In:  Session Check In - 03/02/20 0734      Check-In   Supervising physician immediately available to respond to emergencies See telemetry face sheet for immediately available ER MD    Location [facility]-Cardiac & Pulmonary Rehab    Staff Present Birdie Sons, MPA, RN;Amanda Oletta Darter, BA, ACSM CEP, Exercise Physiologist;Kara Eliezer Bottom, MS Exercise Physiologist    Virtual Visit No    Medication changes reported     No    Fall or balance concerns reported    No    Tobacco Cessation No Change    Warm-up and Cool-down Performed on first and last piece of equipment    Resistance Training Performed Yes    VAD Patient? No    PAD/SET Patient? No      Pain Assessment   Currently in Pain? No/denies              Social History   Tobacco Use  Smoking Status Current Every Day Smoker  . Packs/day: 0.15  . Years: 56.00  . Pack years: 8.40  . Types: Cigarettes  . Start date: 1964  Smokeless Tobacco Never Used  Tobacco Comment   New quit date 02/24/20 - made 02/05/20    Goals Met:  Independence with exercise equipment Exercise tolerated well No report of cardiac concerns or symptoms Strength training completed today  Goals Unmet:  Not Applicable  Comments: Pt able to follow exercise prescription today without complaint.  Will continue to monitor for progression.    Dr. Emily Filbert is Medical Director for [facility].

## 2020-03-03 ENCOUNTER — Encounter: Payer: Self-pay | Admitting: *Deleted

## 2020-03-03 DIAGNOSIS — I214 Non-ST elevation (NSTEMI) myocardial infarction: Secondary | ICD-10-CM

## 2020-03-03 NOTE — Progress Notes (Signed)
Cardiac Individual Treatment Plan  Patient Details  Name: Brent [name] MRN: [id number] Date of Birth: 1945/02/27 Referring Provider:   Flowsheet Row Cardiac Rehab from 12/16/2019 in [facility] Cardiac and Pulmonary Rehab  Referring Provider [name] MD      Initial Encounter Date:  Flowsheet Row Cardiac Rehab from 12/16/2019 in [facility] Cardiac and Pulmonary Rehab  Date 12/16/19      Visit Diagnosis: NSTEMI (non-ST elevated myocardial infarction) [facility])  Patient's Home Medications on Admission:  Current Outpatient Medications:  .  acetaminophen (TYLENOL) 325 MG tablet, Take 650 mg by mouth every 6 (six) hours as needed. , Disp: , Rfl:  .  albuterol (VENTOLIN HFA) 108 (90 Base) MCG/ACT inhaler, Inhale 2 puffs into the lungs every 6 (six) hours as needed for wheezing or shortness of breath., Disp: 6.7 g, Rfl: 2 .  aspirin EC 81 MG EC tablet, Take 1 tablet (81 mg total) by mouth daily., Disp: , Rfl:  .  atorvastatin (LIPITOR) 80 MG tablet, Take 1 tablet (80 mg total) by mouth daily., Disp: 90 tablet, Rfl: 1 .  clopidogrel (PLAVIX) 75 MG tablet, Take 1 tablet (75 mg total) by mouth daily with breakfast., Disp: 30 tablet, Rfl: 11 .  gabapentin (NEURONTIN) 100 MG capsule, Take 1 capsule (100 mg total) by mouth daily., Disp: 90 capsule, Rfl: 1 .  metoprolol succinate (TOPROL-XL) 25 MG 24 hr tablet, Take 0.5 tablets (12.5 mg total) by mouth daily., Disp: 30 tablet, Rfl: 11 .  nitroGLYCERIN (NITROSTAT) 0.4 MG SL tablet, Place 1 tablet (0.4 mg total) under the tongue every 5 (five) minutes as needed for chest pain., Disp: 30 tablet, Rfl: 3  Past Medical History: Past Medical History:  Diagnosis Date  . Aortic calcification (HCC) 07/2015   Noted on chest CT with contrast  . Cataract   . Centrilobular emphysema ([place]) 08/03/2015   Noted on chest CT July 2017  . 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[place]) 07/29/2015  . Coronary artery calcification seen on CAT scan 07/29/2015   Noted on CT scan July 2017  .  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 Guillain Barr syndrome ([place])   . History of colonic polyps   . Hyperlipidemia   . Hypochloremia   . Osteopenia 08/29/2016   August 2018; next scan August 2020  . Right Supraclavicular Lymph node 08/03/2015  . Solitary pulmonary nodule 08/03/2015   CT Chest 07/2015 - Subpleural 3 mm left upper lobe pulmonary nodule, probably benign.  . Tobacco abuse     Tobacco Use: Social History   Tobacco Use  Smoking Status Current Every Day Smoker  . Packs/day: 0.15  . Years: 56.00  . Pack years: 8.40  . Types: Cigarettes  . Start date: 1964  Smokeless Tobacco Never Used  Tobacco Comment   New quit date 02/24/20 - made 02/05/20    Labs: Recent Review Flowsheet Data    Labs for ITP Cardiac and Pulmonary Rehab Latest Ref Rng & Units 03/27/2016 05/24/2017 10/28/2018 10/29/2019 12/01/2019   Cholestrol 0 - 200 mg/dL 162 164 187 140 111   LDLCALC 0 - 99 mg/dL 87 94 109(H) 61 57   HDL >40 mg/dL 48 51 59 65 49   Trlycerides <150 mg/dL 134 95 97 59 26   Hemoglobin A1c 4.2 - 6.3 % - - - - -       Exercise Target Goals: Exercise Program Goal: Individual exercise prescription set using results from initial 6 min walk test and  THRR while considering  patient's activity barriers and safety.   Exercise Prescription Goal: Initial exercise prescription builds to 30-45 minutes a day of aerobic activity, 2-3 days per week.  Home exercise guidelines will be given to patient during program as part of exercise prescription that the participant will acknowledge.   Education: Aerobic Exercise: - Group verbal and visual presentation on the components of exercise prescription. Introduces F.I.T.T principle from ACSM for exercise prescriptions.  Reviews F.I.T.T. principles of aerobic exercise including progression. Written material given at graduation. Flowsheet Row Cardiac Rehab from 02/19/2020 in [facility] Cardiac and Pulmonary Rehab  Education need  identified 12/16/19  Date 01/08/20  Educator AS  Instruction Review Code 1- Verbalizes Understanding      Education: Resistance Exercise: - Group verbal and visual presentation on the components of exercise prescription. Introduces F.I.T.T principle from ACSM for exercise prescriptions  Reviews F.I.T.T. principles of resistance exercise including progression. Written material given at graduation. Flowsheet Row Cardiac Rehab from 02/19/2020 in [facility] Cardiac and Pulmonary Rehab  Education need identified 12/16/19       Education: Exercise & Equipment Safety: - Individual verbal instruction and demonstration of equipment use and safety with use of the equipment. Flowsheet Row Cardiac Rehab from 02/19/2020 in [facility] Cardiac and Pulmonary Rehab  Date 12/16/19  Educator [facility]  Instruction Review Code 1- Verbalizes Understanding      Education: Exercise Physiology & General Exercise Guidelines: - Group verbal and written instruction with models to review the exercise physiology of the cardiovascular system and associated critical values. Provides general exercise guidelines with specific guidelines to those with heart or lung disease.  Flowsheet Row Cardiac Rehab from 02/19/2020 in [facility] Cardiac and Pulmonary Rehab  Date 02/19/20  Educator [facility]  Instruction Review Code 1- Verbalizes Understanding      Education: Flexibility, Balance, Mind/Body Relaxation: - Group verbal and visual presentation with interactive activity on the components of exercise prescription. Introduces F.I.T.T principle from ACSM for exercise prescriptions. Reviews F.I.T.T. principles of flexibility and balance exercise training including progression. Also discusses the mind body connection.  Reviews various relaxation techniques to help reduce and manage stress (i.e. Deep breathing, progressive muscle relaxation, and visualization). Balance handout provided to take home. Written material given at graduation.   Activity Barriers  & Risk Stratification:  Activity Barriers & Cardiac Risk Stratification - 12/16/19 1300      Activity Barriers & Cardiac Risk Stratification   Activity Barriers Back Problems;Deconditioning;Muscular Weakness;Shortness of Breath;Balance Concerns    Cardiac Risk Stratification High           6 Minute Walk:  6 Minute Walk    Row Name 12/16/19 1300         6 Minute Walk   Phase Initial     Distance 1120 feet     Walk Time 6 minutes     # of Rest Breaks 0     MPH 2.12     METS 3.29     RPE 12     Perceived Dyspnea  1     VO2 Peak 11.5     Symptoms Yes (comment)     Comments SOB     Resting HR 89 bpm     Resting BP 150/74     Resting Oxygen Saturation  97 %     Exercise Oxygen Saturation  during 6 min walk 98 %     Max Ex. HR 105 bpm     Max Ex. BP 154/62  2 Minute Post BP 132/74            Oxygen Initial Assessment:   Oxygen Re-Evaluation:   Oxygen Discharge (Final Oxygen Re-Evaluation):   Initial Exercise Prescription:  Initial Exercise Prescription - 12/16/19 1300      Date of Initial Exercise RX and Referring Provider   Date 12/16/19    Referring Provider [name] MD      Treadmill   MPH 2    Grade 0.5    Minutes 15    METs 2.67      Recumbant Bike   Level 2    RPM 50    Watts 22    Minutes 15    METs 2.5      NuStep   Level 2    SPM 80    Minutes 15    METs 2.5      Recumbant Elliptical   Level 1    RPM 50    Minutes 15    METs 2.5      Prescription Details   Frequency (times per week) 2    Duration Progress to 30 minutes of continuous aerobic without signs/symptoms of physical distress      Intensity   THRR 40-80% of Max Heartrate 112-135    Ratings of Perceived Exertion 11-13    Perceived Dyspnea 0-4      Progression   Progression Continue to progress workloads to maintain intensity without signs/symptoms of physical distress.      Resistance Training   Training Prescription Yes    Weight 3 lb    Reps 10-15            Perform Capillary Blood Glucose checks as needed.  Exercise Prescription Changes:  Exercise Prescription Changes    Row Name 12/16/19 1300 12/29/19 1600 01/12/20 1200 01/26/20 1100 02/11/20 1400     Response to Exercise   Blood Pressure (Admit) 150/74 98/52 112/60 120/58 126/60   Blood Pressure (Exercise) 154/62 112/56 118/54 120/60 150/64   Blood Pressure (Exit) 132/74 96/56 102/58 112/58 98/56   Heart Rate (Admit) 89 bpm 93 bpm 109 bpm 99 bpm 99 bpm   Heart Rate (Exercise) 105 bpm 102 bpm 127 bpm 127 bpm 118 bpm   Heart Rate (Exit) 90 bpm 88 bpm 103 bpm 105 bpm 95 bpm   Oxygen Saturation (Admit) 97 % -- -- -- --   Oxygen Saturation (Exercise) 98 % -- -- -- --   Rating of Perceived Exertion (Exercise) 12 10 13 14 13    Perceived Dyspnea (Exercise) 1 -- -- -- --   Symptoms SOB none none none none   Comments walk test results first full day of exercise -- -- --   Duration -- Progress to 30 minutes of  aerobic without signs/symptoms of physical distress Continue with 30 min of aerobic exercise without signs/symptoms of physical distress. Continue with 30 min of aerobic exercise without signs/symptoms of physical distress. Continue with 30 min of aerobic exercise without signs/symptoms of physical distress.   Intensity -- THRR unchanged THRR unchanged THRR unchanged THRR unchanged     Progression   Progression -- Continue to progress workloads to maintain intensity without signs/symptoms of physical distress. Continue to progress workloads to maintain intensity without signs/symptoms of physical distress. Continue to progress workloads to maintain intensity without signs/symptoms of physical distress. Continue to progress workloads to maintain intensity without signs/symptoms of physical distress.   Average METs -- 2.9 2.2 2.56 2.35     Resistance  Training   Training Prescription -- Yes Yes Yes Yes   Weight -- 3 lb 3 lb 5 lb 5 lb   Reps -- 10-15 10-15 10-15 10-15      Interval Training   Interval Training -- No No No No     Treadmill   MPH -- -- 1.7 1.7 2   Grade -- -- 1 1 0.5   Minutes -- -- 15 15 15    METs -- -- 2.54 2.54 2.81     Recumbant Bike   Level -- 2 -- 2 --   Minutes -- 15 -- 15 --     NuStep   Level -- 2 -- 4 --   Minutes -- 15 -- 15 --   METs -- 2.9 -- 3 --     Recumbant Elliptical   Level -- -- 1 1 1.5   RPM -- -- 50 -- 50   Minutes -- -- 15 15 15    METs -- -- 1.8 2.1 1.9   Row Name 02/25/20 1300             Response to Exercise   Blood Pressure (Admit) 118/64       Blood Pressure (Exercise) 120/62       Blood Pressure (Exit) 104/58       Heart Rate (Admit) 96 bpm       Heart Rate (Exercise) 113 bpm       Heart Rate (Exit) 86 bpm       Rating of Perceived Exertion (Exercise) 13       Symptoms none       Duration Continue with 30 min of aerobic exercise without signs/symptoms of physical distress.       Intensity THRR unchanged               Progression   Progression Continue to progress workloads to maintain intensity without signs/symptoms of physical distress.       Average METs 2.68               Resistance Training   Training Prescription Yes       Weight 5 lb       Reps 10-15               Interval Training   Interval Training No               Treadmill   MPH 2       Grade 1.5       Minutes 15       METs 2.94               Recumbant Bike   Level 1.5       Minutes 15               NuStep   Level 3       Minutes 15       METs 3               Recumbant Elliptical   Level 1.5       Minutes 15       METs 2.1               Home Exercise Plan   Plans to continue exercise at Home (comment)  walking       Frequency Add 2 additional days to program exercise sessions.       Initial Home Exercises Provided 01/13/20  Exercise Comments:  Exercise Comments    Row Name 12/23/19 [phone]           Exercise Comments First full day of exercise!  Patient was oriented to gym and  equipment including functions, settings, policies, and procedures.  Patient's individual exercise prescription and treatment plan were reviewed.  All starting workloads were established based on the results of the 6 minute walk test done at initial orientation visit.  The plan for exercise progression was also introduced and progression will be customized based on patient's performance and goals.              Exercise Goals and Review:  Exercise Goals    Row Name 12/16/19 1405             Exercise Goals   Increase Physical Activity Yes       Intervention Provide advice, education, support and counseling about physical activity/exercise needs.;Develop an individualized exercise prescription for aerobic and resistive training based on initial evaluation findings, risk stratification, comorbidities and participant's personal goals.       Expected Outcomes Short Term: Attend rehab on a regular basis to increase amount of physical activity.;Long Term: Add in home exercise to make exercise part of routine and to increase amount of physical activity.;Long Term: Exercising regularly at least 3-5 days a week.       Increase Strength and Stamina Yes       Intervention Provide advice, education, support and counseling about physical activity/exercise needs.;Develop an individualized exercise prescription for aerobic and resistive training based on initial evaluation findings, risk stratification, comorbidities and participant's personal goals.       Expected Outcomes Short Term: Increase workloads from initial exercise prescription for resistance, speed, and METs.;Short Term: Perform resistance training exercises routinely during rehab and add in resistance training at home;Long Term: Improve cardiorespiratory fitness, muscular endurance and strength as measured by increased METs and functional capacity (6MWT)       Able to understand and use rate of perceived exertion (RPE) scale Yes       Intervention  Provide education and explanation on how to use RPE scale       Expected Outcomes Short Term: Able to use RPE daily in rehab to express subjective intensity level;Long Term:  Able to use RPE to guide intensity level when exercising independently       Able to understand and use Dyspnea scale Yes       Intervention Provide education and explanation on how to use Dyspnea scale       Expected Outcomes Short Term: Able to use Dyspnea scale daily in rehab to express subjective sense of shortness of breath during exertion;Long Term: Able to use Dyspnea scale to guide intensity level when exercising independently       Knowledge and understanding of Target Heart Rate Range (THRR) Yes       Intervention Provide education and explanation of THRR including how the numbers were predicted and where they are located for reference       Expected Outcomes Short Term: Able to state/look up THRR;Short Term: Able to use daily as guideline for intensity in rehab;Long Term: Able to use THRR to govern intensity when exercising independently       Able to check pulse independently Yes       Intervention Provide education and demonstration on how to check pulse in carotid and radial arteries.;Review the importance of being able to check your own pulse for safety during independent  exercise       Expected Outcomes Short Term: Able to explain why pulse checking is important during independent exercise;Long Term: Able to check pulse independently and accurately       Understanding of Exercise Prescription Yes       Intervention Provide education, explanation, and written materials on patient's individual exercise prescription       Expected Outcomes Short Term: Able to explain program exercise prescription;Long Term: Able to explain home exercise prescription to exercise independently              Exercise Goals Re-Evaluation :  Exercise Goals Re-Evaluation    Row Name 12/23/19 0843 12/29/19 1646 01/12/20 1223 01/13/20  0831 01/26/20 1104     Exercise Goal Re-Evaluation   Exercise Goals Review Able to understand and use rate of perceived exertion (RPE) scale;Knowledge and understanding of Target Heart Rate Range (THRR);Understanding of Exercise Prescription;Able to understand and use Dyspnea scale Increase Physical Activity;Increase Strength and Stamina;Understanding of Exercise Prescription Increase Physical Activity;Increase Strength and Stamina Increase Physical Activity;Increase Strength and Stamina Increase Physical Activity;Increase Strength and Stamina;Understanding of Exercise Prescription   Comments Reviewed RPE and dyspnea scales, THR and program prescription with pt today.  Pt voiced understanding and was given a copy of goals to take home. Brent [name] has completed his first full day of exercise. He did well and we will continue to monitor his progress. Brent [name] continues to do well with exercise.  He attends consistently and works at [employer].  Staff will encourage working in [facility] range. Reviewed home exercise with pt today.  Pt plans to walk and use weights for exercise.  Reviewed THR, pulse, RPE, sign and symptoms, pulse oximetery and when to call 911 or MD.  Also discussed weather considerations and indoor options.  Pt voiced understanding. Brent [name] is doing well in rehab.  He is now up to level 4 on the NuStep.  We will continue to monitor his progress.   Expected Outcomes Short: Use RPE daily to regulate intensity. Long: Follow program prescription in THR. Short: Attend rehab regularly Long: Continue to follow program prescription Short: reach THR range during exercise Long:  improve overall stamina Short: Monitor vitals while exercising Long: Exercise independently at home Short: Add speed back to treadmill Long; Continue to improve stamina   Row Name 02/05/20 0759 02/11/20 1421 02/11/20 1423 02/25/20 1353       Exercise Goal Re-Evaluation   Exercise Goals Review Increase Physical Activity;Increase Strength and  Stamina;Understanding of Exercise Prescription Increase Physical Activity;Increase Strength and Stamina -- Increase Physical Activity;Increase Strength and Stamina;Understanding of Exercise Prescription    Comments Brent [name] reports starting to walk at home and doing weights. He walks at home everyday at home if he can- he walks for 20 minutes (RPE 13). He has weights at home 3lbs - most days of the week; 15 minutes. -- Brent [name] has increased speed on TM.  He attends consistently and works at lower end of THR range.  Staff will monitor progress. Brent [name] is doing well in rehab.  He is up level 1.5 on the recumbent ellipitcal.  We will continue to monitor his progress.    Expected Outcomes Short: Continue to exercise at home, add warm up and cool down Long; Continue to improve stamina -- Short:  continue exercise at home 1-2 days outside program sessions Long:  build overall strength and stamina Short: Start to increase workloads again.  Long: Continue to improve stamina.  Discharge Exercise Prescription (Final Exercise Prescription Changes):  Exercise Prescription Changes - 02/25/20 1300      Response to Exercise   Blood Pressure (Admit) 118/64    Blood Pressure (Exercise) 120/62    Blood Pressure (Exit) 104/58    Heart Rate (Admit) 96 bpm    Heart Rate (Exercise) 113 bpm    Heart Rate (Exit) 86 bpm    Rating of Perceived Exertion (Exercise) 13    Symptoms none    Duration Continue with 30 min of aerobic exercise without signs/symptoms of physical distress.    Intensity THRR unchanged      Progression   Progression Continue to progress workloads to maintain intensity without signs/symptoms of physical distress.    Average METs 2.68      Resistance Training   Training Prescription Yes    Weight 5 lb    Reps 10-15      Interval Training   Interval Training No      Treadmill   MPH 2    Grade 1.5    Minutes 15    METs 2.94      Recumbant Bike   Level 1.5    Minutes 15       NuStep   Level 3    Minutes 15    METs 3      Recumbant Elliptical   Level 1.5    Minutes 15    METs 2.1      Home Exercise Plan   Plans to continue exercise at Home (comment)   walking   Frequency Add 2 additional days to program exercise sessions.    Initial Home Exercises Provided 01/13/20           Nutrition:  Target Goals: Understanding of nutrition guidelines, daily intake of sodium 1500mg , cholesterol 200mg , calories 30% from fat and 7% or less from saturated fats, daily to have 5 or more servings of fruits and vegetables.  Education: All About Nutrition: -Group instruction provided by verbal, written material, interactive activities, discussions, models, and posters to present general guidelines for heart healthy nutrition including fat, fiber, MyPlate, the role of sodium in heart healthy nutrition, utilization of the nutrition label, and utilization of this knowledge for meal planning. Follow up email sent as well. Written material given at graduation.   Biometrics:  Pre Biometrics - 12/16/19 1406      Pre Biometrics   Height 5' 8.9" (1.75 m)    Weight 119 lb (54 kg)    BMI (Calculated) 17.63    Single Leg Stand 8.7 seconds            Nutrition Therapy Plan and Nutrition Goals:  Nutrition Therapy & Goals - 01/12/20 0833      Nutrition Therapy   Diet High kcal/protein    Drug/Food Interactions Statins/Certain Fruits    Protein (specify units) 65g    Fiber 30 grams    Whole Grain Foods 3 servings    Saturated Fats 12 max. grams    Fruits and Vegetables 5 servings/day    Sodium 1.5 grams      Personal Nutrition Goals   Nutrition Goal ST: add peanut butter to breakfast, add another snack or boost/ensure, eat protein foods first, add fat to meals already eating LT: meet nutritional needs    Comments Brent [name] would like to improve his strength and his appetite - lower than his baseline: he rarely gets hungry. B: 11am: oatmeal (sugar, raisins or banana, and  whole milk), sausage  D: 6pm: chicken, pork chops, greens, potatoes, red meat 3x/week.  S: peanut butter crackers, potato chips Drinks: coffee - cream and sugar, gatoraide, and water. He eats a couple of tablespoons of food at each of your meals, he was eating a plateful of food at his meals. He reports in the last couple of months his appetite changed - he does not know what caused it, but he believes stress has something to do with it. He reports his weight is now stable (2-3 lbs up and down). Discussed high kcal/high protein nutrition therapy and briefly reviewed heart healthy eating - will focus more when his intake is adequate. RD in November reported he had severe malnutrition.      Intervention Plan   Intervention Prescribe, educate and counsel regarding individualized specific dietary modifications aiming towards targeted core components such as weight, hypertension, lipid management, diabetes, heart failure and other comorbidities.;Nutrition handout(s) given to patient.    Expected Outcomes Short Term Goal: Understand basic principles of dietary content, such as calories, fat, sodium, cholesterol and nutrients.;Short Term Goal: A plan has been developed with personal nutrition goals set during dietitian appointment.;Long Term Goal: Adherence to prescribed nutrition plan.           Nutrition Assessments:  MEDIFICTS Score Key:  ?70 Need to make dietary changes   40-70 Heart Healthy Diet  ? 40 Therapeutic Level Cholesterol Diet  Flowsheet Row Cardiac Rehab from 12/16/2019 in [facility] Cardiac and Pulmonary Rehab  Picture Your Plate Total Score on Admission 45     Picture Your Plate Scores:  <42 Unhealthy dietary pattern with much room for improvement.  41-50 Dietary pattern unlikely to meet recommendations for good health and room for improvement.  51-60 More healthful dietary pattern, with some room for improvement.   >60 Healthy dietary pattern, although there may be some specific  behaviors that could be improved.    Nutrition Goals Re-Evaluation:  Nutrition Goals Re-Evaluation    [place] Name 02/05/20 0808             Goals   Nutrition Goal ST:  Add fat to meals he already eats (he doesn't eat much butter, but like olive oil which is more heart healthy), add another ensure. LTmeet nutritional needs.       Comment He has added peanut butter to his oatmeal, he is eating his protein foods first. He has not added the ensure, he also hasn't added fat to his meals. Reviewed fats he could add (healhty vs less healthy).       Expected Outcome ST:  Add fat to meals he already eats (he doesn't eat much butter, but like olive oil which is more heart healthy), add another ensure. LTmeet nutritional needs.              Nutrition Goals Discharge (Final Nutrition Goals Re-Evaluation):  Nutrition Goals Re-Evaluation - 02/05/20 0808      Goals   Nutrition Goal ST:  Add fat to meals he already eats (he doesn't eat much butter, but like olive oil which is more heart healthy), add another ensure. LTmeet nutritional needs.    Comment He has added peanut butter to his oatmeal, he is eating his protein foods first. He has not added the ensure, he also hasn't added fat to his meals. Reviewed fats he could add (healhty vs less healthy).    Expected Outcome ST:  Add fat to meals he already eats (he doesn't eat much butter, but like olive oil which  is more heart healthy), add another ensure. LTmeet nutritional needs.           Psychosocial: Target Goals: Acknowledge presence or absence of significant depression and/or stress, maximize coping skills, provide positive support system. Participant is able to verbalize types and ability to use techniques and skills needed for reducing stress and depression.   Education: Stress, Anxiety, and Depression - Group verbal and visual presentation to define topics covered.  Reviews how body is impacted by stress, anxiety, and depression.  Also  discusses healthy ways to reduce stress and to treat/manage anxiety and depression.  Written material given at graduation.   Education: Sleep Hygiene -Provides group verbal and written instruction about how sleep can affect your health.  Define sleep hygiene, discuss sleep cycles and impact of sleep habits. Review good sleep hygiene tips.    Initial Review & Psychosocial Screening:  Initial Psych Review & Screening - 12/12/19 1413      Initial Review   Current issues with Current Stress Concerns    Comments Quit smoking      Family Dynamics   Good Support System? Yes      Barriers   Psychosocial barriers to participate in program There are no identifiable barriers or psychosocial needs.;The patient should benefit from training in stress management and relaxation.      Screening Interventions   Interventions Encouraged to exercise;To provide support and resources with identified psychosocial needs;Provide feedback about the scores to participant    Expected Outcomes Short Term goal: Utilizing psychosocial counselor, staff and physician to assist with identification of specific Stressors or current issues interfering with healing process. Setting desired goal for each stressor or current issue identified.;Long Term Goal: Stressors or current issues are controlled or eliminated.;Short Term goal: Identification and review with participant of any Quality of Life or Depression concerns found by scoring the questionnaire.;Long Term goal: The participant improves quality of Life and PHQ9 Scores as seen by post scores and/or verbalization of changes           Quality of Life Scores:   Quality of Life - 12/16/19 1407      Quality of Life   Select Quality of Life      Quality of Life Scores   Health/Function Pre 20.4 %    Socioeconomic Pre 19 %    Psych/Spiritual Pre 23.14 %    Family Pre 26.4 %    GLOBAL Pre 21.64 %          Scores of 19 and below usually indicate a poorer quality  of life in these areas.  A difference of  2-3 points is a clinically meaningful difference.  A difference of 2-3 points in the total score of the Quality of Life Index has been associated with significant improvement in overall quality of life, self-image, physical symptoms, and general health in studies assessing change in quality of life.  PHQ-9: Recent Review Flowsheet Data    Depression screen [facility] 2/9 01/29/2020 12/16/2019 10/29/2019 05/20/2019 01/28/2019   Decreased Interest 0 0 2 0 1   Down, Depressed, Hopeless 0 0 2 0 1   PHQ - 2 Score 0 0 4 0 2   Altered sleeping 0 1 3 - 0   Tired, decreased energy 0 1 3 - 1   Change in appetite 0 0 3 - 1   Feeling bad or failure about yourself  0 1 3 - 1   Trouble concentrating 0 0 0 - 0   Moving  slowly or fidgety/restless 0 1 2 - 0   Suicidal thoughts 0 1  2 - 0   PHQ-9 Score 0 5 20 - 5   Difficult doing work/chores Not difficult at all Somewhat difficult Somewhat difficult - Not difficult at all     Interpretation of Total Score  Total Score Depression Severity:  1-4 = Minimal depression, 5-9 = Mild depression, 10-14 = Moderate depression, 15-19 = Moderately severe depression, 20-27 = Severe depression   Psychosocial Evaluation and Intervention:  Psychosocial Evaluation - 12/12/19 1413      Psychosocial Evaluation & Interventions   Comments 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 He did quit smoking when he was admitted on 11/21. He states it has been a struggle but he's making it work. He was made aware about access to resources to continue on this journey of quitting smoking. He wants to get "overall better health" and hopes to achieve that by coming to cardiac rehab.    Expected Outcomes Short: attend cardiac rehab for education and exercise. Long: develop positive self care habits.    Continue Psychosocial Services  Follow up required by staff           Psychosocial Re-Evaluation:  Psychosocial Re-Evaluation    Row Name 01/13/20 0824  01/29/20 0853 02/05/20 0801         Psychosocial Re-Evaluation   Current issues with Current Stress Concerns Current Depression --     Comments Brent [name] is still working on quitting smoking.  He reports no signs of anxiety or depression.  He doesnt sleep well.  He has trouble staying asleep.  Staff recommended he talk to his Dr.  and use breathing, music or reading to help fall asleep. Brent [name] is doing well in rehab.  His repeat PHQ is down to 0 now. He attributes it to getting out more and feeling better. He reports feeling ok - he is not on any medication. He reports this month has been bad for him - the snow (his generator gave out), his water heater gave out. He reports he has support in his wife. He manages his stress by singing and reading. He is trying to reduce his smoking, his quit date was 01/17/20, but the stress of this month hurt his progress; he is goign to try to quit 2/15.He reports that he gets frustrated being unable to lift certain things now.     Expected Outcomes Short: talk to Dr about sleep Long: develop better sleep patterns Short: Continue to get back to exercise Long: Continue feel better. Short: continue to use stress management habits, new smoking quit date 2/15, increase muscle to help with ADLs Long: Continue feel better overall     Interventions -- Encouraged to attend Cardiac Rehabilitation for the exercise Encouraged to attend Cardiac Rehabilitation for the exercise     Continue Psychosocial Services  -- Follow up required by staff Follow up required by staff     Comments -- -- House problems during the winter.           Initial Review   Source of Stress Concerns -- -- Financial;Unable to perform yard/household activities            Psychosocial Discharge (Final Psychosocial Re-Evaluation):  Psychosocial Re-Evaluation - 02/05/20 0801      Psychosocial Re-Evaluation   Comments He reports feeling ok - he is not on any medication. He reports this month has been bad  for him - the snow (his generator gave out), his water  heater gave out. He reports he has support in his wife. He manages his stress by singing and reading. He is trying to reduce his smoking, his quit date was 01/17/20, but the stress of this month hurt his progress; he is goign to try to quit 2/15.He reports that he gets frustrated being unable to lift certain things now.    Expected Outcomes Short: continue to use stress management habits, new smoking quit date 2/15, increase muscle to help with ADLs Long: Continue feel better overall    Interventions Encouraged to attend Cardiac Rehabilitation for the exercise    Continue Psychosocial Services  Follow up required by staff    Comments House problems during the winter.      Initial Review   Source of Stress Concerns Financial;Unable to perform yard/household activities           Vocational Rehabilitation: Provide vocational rehab assistance to qualifying candidates.   Vocational Rehab Evaluation & Intervention:  Vocational Rehab - 12/12/19 1413      Initial Vocational Rehab Evaluation & Intervention   Assessment shows need for Vocational Rehabilitation No           Education: Education Goals: Education classes will be provided on a variety of topics geared toward better understanding of heart health and risk factor modification. Participant will state understanding/return demonstration of topics presented as noted by education test scores.  Learning Barriers/Preferences:  Learning Barriers/Preferences - 12/12/19 1413      Learning Barriers/Preferences   Learning Barriers None    Learning Preferences None           General Cardiac Education Topics:  AED/CPR: - Group verbal and written instruction with the use of models to demonstrate the basic use of the AED with the basic ABC's of resuscitation.   Anatomy and Cardiac Procedures: - Group verbal and visual presentation and models provide information about basic cardiac  anatomy and function. Reviews the testing methods done to diagnose heart disease and the outcomes of the test results. Describes the treatment choices: Medical Management, Angioplasty, or Coronary Bypass Surgery for treating various heart conditions including Myocardial Infarction, Angina, Valve Disease, and Cardiac Arrhythmias.  Written material given at graduation.   Medication Safety: - Group verbal and visual instruction to review commonly prescribed medications for heart and lung disease. Reviews the medication, class of the drug, and side effects. Includes the steps to properly store meds and maintain the prescription regimen.  Written material given at graduation. Flowsheet Row Cardiac Rehab from 02/19/2020 in [facility] Cardiac and Pulmonary Rehab  Date 01/29/20  Educator SB  Instruction Review Code 1- Verbalizes Understanding      Intimacy: - Group verbal instruction through game format to discuss how heart and lung disease can affect sexual intimacy. Written material given at graduation.. Flowsheet Row Cardiac Rehab from 02/19/2020 in [facility] Cardiac and Pulmonary Rehab  Date 01/08/20  Educator as  Instruction Review Code 1- Verbalizes Understanding      Know Your Numbers and Heart Failure: - Group verbal and visual instruction to discuss disease risk factors for cardiac and pulmonary disease and treatment options.  Reviews associated critical values for Overweight/Obesity, Hypertension, Cholesterol, and Diabetes.  Discusses basics of heart failure: signs/symptoms and treatments.  Introduces Heart Failure Zone chart for action plan for heart failure.  Written material given at graduation.   Infection Prevention: - Provides verbal and written material to individual with discussion of infection control including proper hand washing and proper equipment cleaning during exercise session.  Flowsheet Row Cardiac Rehab from 02/19/2020 in [facility] Cardiac and Pulmonary Rehab  Date 12/16/19  Educator [facility]   Instruction Review Code 1- Verbalizes Understanding      Falls Prevention: - Provides verbal and written material to individual with discussion of falls prevention and safety. Flowsheet Row Cardiac Rehab from 02/19/2020 in [facility] Cardiac and Pulmonary Rehab  Date 12/16/19  Educator [facility]  Instruction Review Code 1- Verbalizes Understanding      Other: -Provides group and verbal instruction on various topics (see comments)   Knowledge Questionnaire Score:  Knowledge Questionnaire Score - 12/16/19 1408      Knowledge Questionnaire Score   Pre Score 24/26 Education focus: exercise           Core Components/Risk Factors/Patient Goals at Admission:  Personal Goals and Risk Factors at Admission - 12/16/19 1409      Core Components/Risk Factors/Patient Goals on Admission    Weight Management Yes;Weight Gain    Intervention Weight Management: Develop a combined nutrition and exercise program designed to reach desired caloric intake, while maintaining appropriate intake of nutrient and fiber, sodium and fats, and appropriate energy expenditure required for the weight goal.;Weight Management: Provide education and appropriate resources to help participant work on and attain dietary goals.    Admit Weight 119 lb (54 kg)    Goal Weight: Short Term 122 lb (55.3 kg)    Goal Weight: Long Term 125 lb (56.7 kg)    Expected Outcomes Short Term: Continue to assess and modify interventions until short term weight is achieved;Long Term: Adherence to nutrition and physical activity/exercise program aimed toward attainment of established weight goal;Weight Gain: Understanding of general recommendations for a high calorie, high protein meal plan that promotes weight gain by distributing calorie intake throughout the day with the consumption for 4-5 meals, snacks, and/or supplements    Tobacco Cessation Yes    Intervention Assist the participant in steps to quit. Provide individualized education and  counseling about committing to Tobacco Cessation, relapse prevention, and pharmacological support that can be provided by physician.;Advice worker, assist with locating and accessing local/national Quit Smoking programs, and support quit date choice.    Expected Outcomes Short Term: Will quit all tobacco product use, adhering to prevention of relapse plan.;Long Term: Complete abstinence from all tobacco products for at least 12 months from quit date.    Hypertension Yes    Intervention Provide education on lifestyle modifcations including regular physical activity/exercise, weight management, moderate sodium restriction and increased consumption of fresh fruit, vegetables, and low fat dairy, alcohol moderation, and smoking cessation.;Monitor prescription use compliance.    Expected Outcomes Long Term: Maintenance of blood pressure at goal levels.;Short Term: Continued assessment and intervention until BP is < 140/26mm HG in hypertensive participants. < 130/45mm HG in hypertensive participants with diabetes, heart failure or chronic kidney disease.    Lipids Yes    Intervention Provide education and support for participant on nutrition & aerobic/resistive exercise along with prescribed medications to achieve LDL 70mg , HDL >40mg .    Expected Outcomes Short Term: Participant states understanding of desired cholesterol values and is compliant with medications prescribed. Participant is following exercise prescription and nutrition guidelines.;Long Term: Cholesterol controlled with medications as prescribed, with individualized exercise RX and with personalized nutrition plan. Value goals: LDL < 70mg , HDL > 40 mg.           Education:Diabetes - Individual verbal and written instruction to review signs/symptoms of diabetes, desired ranges of glucose level fasting, after meals  and with exercise. Acknowledge that pre and post exercise glucose checks will be done for 3 sessions at entry of  program.   Core Components/Risk Factors/Patient Goals Review:   Goals and Risk Factor Review    Row Name 01/13/20 0820 02/05/20 0813           Core Components/Risk Factors/Patient Goals Review   Personal Goals Review Hypertension;Tobacco Cessation Hypertension;Tobacco Cessation      Review Treylan does plan to quit smoking.  He is down to about 3 cigarettes per day.  he does have Quit [employer].  He is trying to quit cold [place].  He says his wife is his "quit coach" at home.  he takes meds as directed most of the time - he missed yesterday due to power outage. He is trying to reduce his smoking (stil 3 cigarettes/day), his quit date was 01/17/20, but the stress of this month hurt his progress; he is goign to try to quit 2/15. He has the [employer]. He is still taking his medications as directed.      Expected Outcomes Short: set another quit date Long: quit smoking altogether Short: new quit dat 2/15 Long: quit smoking altogether             Core Components/Risk Factors/Patient Goals at Discharge (Final Review):   Goals and Risk Factor Review - 02/05/20 0813      Core Components/Risk Factors/Patient Goals Review   Personal Goals Review Hypertension;Tobacco Cessation    Review He is trying to reduce his smoking (stil 3 cigarettes/day), his quit date was 01/17/20, but the stress of this month hurt his progress; he is goign to try to quit 2/15. He has the [employer]. He is still taking his medications as directed.    Expected Outcomes Short: new quit dat 2/15 Long: quit smoking altogether           ITP Comments:  ITP Comments    Row Name 12/12/19 1403 12/16/19 1149 12/23/19 0842 01/07/20 0731 01/12/20 0858   ITP Comments Initial orientation completed. Diagnosis can be found in [facility] 11/21. EP orientation scheduled for Tuesday, 12/7 at 9am. Completed 6MWT and gym orientation. Initial ITP created and sent for review to Dr. Emily Filbert, Medical Director. First  full day of exercise!  Patient was oriented to gym and equipment including functions, settings, policies, and procedures.  Patient's individual exercise prescription and treatment plan were reviewed.  All starting workloads were established based on the results of the 6 minute walk test done at initial orientation visit.  The plan for exercise progression was also introduced and progression will be customized based on patient's performance and goals. 30 Day review completed. Medical Director ITP review done, changes made as directed, and signed approval by Medical Director. Completed initial RD Evaluation   Row Name 02/04/20 1002 03/03/20 0719         ITP Comments 30 Day review completed. Medical Director ITP review done, changes made as directed, and signed approval by Medical Director. 30 Day review completed. Medical Director ITP review done, changes made as directed, and signed approval by Medical Director.             Comments:

## 2020-03-04 ENCOUNTER — Other Ambulatory Visit: Payer: Self-pay

## 2020-03-04 DIAGNOSIS — I252 Old myocardial infarction: Secondary | ICD-10-CM | POA: Diagnosis not present

## 2020-03-04 DIAGNOSIS — I214 Non-ST elevation (NSTEMI) myocardial infarction: Secondary | ICD-10-CM

## 2020-03-04 NOTE — Progress Notes (Signed)
Daily Session Note  Patient Details  Name: Brent [name] MRN: [id number] Date of Birth: 19-May-1945 Referring Provider:   Flowsheet Row Cardiac Rehab from 12/16/2019 in [facility] Cardiac and Pulmonary Rehab  Referring Provider [name] MD      Encounter Date: 03/04/2020  Check In:  Session Check In - 03/04/20 0753      Check-In   Supervising physician immediately available to respond to emergencies See telemetry face sheet for immediately available ER MD    Location [facility]-Cardiac & Pulmonary Rehab    Staff Present Birdie Sons, MPA, RN;Melissa Caiola RDN, Rowe Pavy, BA, ACSM CEP, Exercise Physiologist    Virtual Visit No    Medication changes reported     No    Fall or balance concerns reported    No    Tobacco Cessation No Change    Current number of cigarettes/nicotine per day     2    Warm-up and Cool-down Performed on first and last piece of equipment    Resistance Training Performed Yes    VAD Patient? No    PAD/SET Patient? No      Pain Assessment   Currently in Pain? No/denies              Social History   Tobacco Use  Smoking Status Current Every Day Smoker  . Packs/day: 0.15  . Years: 56.00  . Pack years: 8.40  . Types: Cigarettes  . Start date: 1964  Smokeless Tobacco Never Used  Tobacco Comment   New quit date 02/24/20 - made 02/05/20    Goals Met:  Independence with exercise equipment Exercise tolerated well No report of cardiac concerns or symptoms Strength training completed today  Goals Unmet:  Not Applicable  Comments: Pt able to follow exercise prescription today without complaint.  Will continue to monitor for progression.    Dr. Emily Filbert is Medical Director for [place] and [facility].

## 2020-03-09 ENCOUNTER — Other Ambulatory Visit: Payer: Self-pay

## 2020-03-09 ENCOUNTER — Encounter: Payer: Medicare PPO | Attending: Cardiovascular Disease | Admitting: *Deleted

## 2020-03-09 DIAGNOSIS — Z5189 Encounter for other specified aftercare: Secondary | ICD-10-CM | POA: Diagnosis not present

## 2020-03-09 DIAGNOSIS — I252 Old myocardial infarction: Secondary | ICD-10-CM | POA: Insufficient documentation

## 2020-03-09 DIAGNOSIS — I214 Non-ST elevation (NSTEMI) myocardial infarction: Secondary | ICD-10-CM

## 2020-03-09 NOTE — Progress Notes (Signed)
Daily Session Note  Patient Details  Name: DAJUAN TURNLEY MRN: [id number] Date of Birth: September 01, 1945 Referring Provider:   Flowsheet Row Cardiac Rehab from 12/16/2019 in [facility] Cardiac and Pulmonary Rehab  Referring Provider [name] MD      Encounter Date: 03/09/2020  Check In:  Session Check In - 03/09/20 0836      Check-In   Supervising physician immediately available to respond to emergencies See telemetry face sheet for immediately available ER MD    Location [facility]-Cardiac & Pulmonary Rehab    Staff Present Heath Lark, RN, BSN, CCRP;Amanda Sommer, BA, ACSM CEP, Exercise Physiologist;Kara Eliezer Bottom, MS Exercise Physiologist    Virtual Visit No    Medication changes reported     No    Fall or balance concerns reported    No    Current number of cigarettes/nicotine per day     2   Did not make the quit date in Feb, thinking of another date for quitting.   Warm-up and Cool-down Performed on first and last piece of equipment    Resistance Training Performed Yes    VAD Patient? No    PAD/SET Patient? No      Pain Assessment   Currently in Pain? No/denies              Social History   Tobacco Use  Smoking Status Current Every Day Smoker  . Packs/day: 0.15  . Years: 56.00  . Pack years: 8.40  . Types: Cigarettes  . Start date: 1964  Smokeless Tobacco Never Used  Tobacco Comment   New quit date 02/24/20 - made 02/05/20    Goals Met:  Independence with exercise equipment Exercise tolerated well No report of cardiac concerns or symptoms  Goals Unmet:  Not Applicable  Comments: Pt able to follow exercise prescription today without complaint.  Will continue to monitor for progression.    Dr. Emily Filbert is Medical Director for [place] and [facility].

## 2020-03-11 ENCOUNTER — Other Ambulatory Visit: Payer: Self-pay

## 2020-03-11 DIAGNOSIS — Z5189 Encounter for other specified aftercare: Secondary | ICD-10-CM | POA: Diagnosis not present

## 2020-03-11 DIAGNOSIS — I214 Non-ST elevation (NSTEMI) myocardial infarction: Secondary | ICD-10-CM

## 2020-03-11 NOTE — Progress Notes (Signed)
Daily Session Note  Patient Details  Name: Brent [name] MRN: [id number] Date of Birth: 05-28-1945 Referring Provider:   Flowsheet Row Cardiac Rehab from 12/16/2019 in [facility] Cardiac and Pulmonary Rehab  Referring Provider [name] MD      Encounter Date: 03/11/2020  Check In:  Session Check In - 03/11/20 0756      Check-In   Supervising physician immediately available to respond to emergencies See telemetry face sheet for immediately available ER MD    Location [facility]-Cardiac & Pulmonary Rehab    Staff Present Birdie Sons, MPA, RN;Melissa Caiola RDN, LDN;Jessica Luan Pulling, MA, RCEP, CCRP, CCET    Virtual Visit No    Medication changes reported     No    Fall or balance concerns reported    No    Tobacco Cessation No Change    Current number of cigarettes/nicotine per day     2    Warm-up and Cool-down Performed on first and last piece of equipment    Resistance Training Performed Yes    VAD Patient? No    PAD/SET Patient? No      Pain Assessment   Currently in Pain? No/denies              Social History   Tobacco Use  Smoking Status Current Every Day Smoker  . Packs/day: 0.15  . Years: 56.00  . Pack years: 8.40  . Types: Cigarettes  . Start date: 1964  Smokeless Tobacco Never Used  Tobacco Comment   New quit date 02/24/20 - made 02/05/20    Goals Met:  Independence with exercise equipment Exercise tolerated well Personal goals reviewed No report of cardiac concerns or symptoms Strength training completed today  Goals Unmet:  Not Applicable  Comments: Pt able to follow exercise prescription today without complaint.  Will continue to monitor for progression.    Dr. Emily Filbert is Medical Director for [place] and [facility].

## 2020-03-16 ENCOUNTER — Other Ambulatory Visit: Payer: Self-pay

## 2020-03-16 DIAGNOSIS — Z5189 Encounter for other specified aftercare: Secondary | ICD-10-CM | POA: Diagnosis not present

## 2020-03-16 DIAGNOSIS — I214 Non-ST elevation (NSTEMI) myocardial infarction: Secondary | ICD-10-CM

## 2020-03-16 NOTE — Progress Notes (Signed)
Daily Session Note  Patient Details  Name: Brent [name] MRN: [id number] Date of Birth: Sep 20, 1945 Referring Provider:   Flowsheet Row Cardiac Rehab from 12/16/2019 in [facility]  Referring Provider [name] MD      Encounter Date: 03/16/2020  Check In:  Session Check In - 03/16/20 0743      Check-In   Supervising physician immediately available to respond to emergencies See telemetry face sheet for immediately available ER MD    Location [facility]-Cardiac & Pulmonary Rehab    Staff Present Birdie Sons, MPA, Nino Glow, MS Exercise Physiologist;Amanda Oletta Darter, BA, ACSM CEP, Exercise Physiologist    Virtual Visit No    Medication changes reported     No    Fall or balance concerns reported    No    Tobacco Cessation No Change    Current number of cigarettes/nicotine per day     2    Warm-up and Cool-down Performed on first and last piece of equipment    Resistance Training Performed Yes    VAD Patient? No    PAD/SET Patient? No      Pain Assessment   Currently in Pain? No/denies              Social History   Tobacco Use  Smoking Status Current Every Day Smoker  . Packs/day: 0.15  . Years: 56.00  . Pack years: 8.40  . Types: Cigarettes  . Start date: 1964  Smokeless Tobacco Never Used  Tobacco Comment   New quit date 02/24/20 - made 02/05/20    Goals Met:  Independence with exercise equipment Exercise tolerated well No report of cardiac concerns or symptoms Strength training completed today  Goals Unmet:  Not Applicable  Comments: Pt able to follow exercise prescription today without complaint.  Will continue to monitor for progression.    Dr. Emily Filbert is Medical Director for [place] and [facility].

## 2020-03-18 ENCOUNTER — Other Ambulatory Visit: Payer: Self-pay

## 2020-03-18 DIAGNOSIS — Z5189 Encounter for other specified aftercare: Secondary | ICD-10-CM | POA: Diagnosis not present

## 2020-03-18 DIAGNOSIS — I214 Non-ST elevation (NSTEMI) myocardial infarction: Secondary | ICD-10-CM

## 2020-03-18 NOTE — Progress Notes (Signed)
Daily Session Note  Patient Details  Name: Brent [name] MRN: [id number] Date of Birth: 19-Oct-1945 Referring Provider:   Flowsheet Row Cardiac Rehab from 12/16/2019 in [facility] Card[facility] Rehab  Referring Provider [name] MD      Encounter Date: 03/18/2020  Check In:  Session Check In - 03/18/20 0802      Check-In   Supervising physician immediately available to respond to emergencies See telemetry face sheet for immediately available ER MD    Location [facility]-Cardiac & Pulmonary Rehab    Staff Present Birdie Sons, MPA, Mauricia Area, BS, ACSM CEP, Exercise Physiologist;Amanda Oletta Darter, BA, ACSM CEP, Exercise Physiologist    Virtual Visit No    Medication changes reported     No    Fall or balance concerns reported    No    Tobacco Cessation Use Decreased    Current number of cigarettes/nicotine per day     1    Warm-up and Cool-down Performed on first and last piece of equipment    Resistance Training Performed Yes    VAD Patient? No    PAD/SET Patient? No      Pain Assessment   Currently in Pain? No/denies              Social History   Tobacco Use  Smoking Status Current Every Day Smoker  . Packs/day: 0.15  . Years: 56.00  . Pack years: 8.40  . Types: Cigarettes  . Start date: 1964  Smokeless Tobacco Never Used  Tobacco Comment   New quit date 02/24/20 - made 02/05/20    Goals Met:  Independence with exercise equipment Exercise tolerated well No report of cardiac concerns or symptoms Strength training completed today  Goals Unmet:  Not Applicable  Comments: Pt able to follow exercise prescription today without complaint.  Will continue to monitor for progression.    Dr. Emily Filbert is Medical Director for [place] and [facility].

## 2020-03-23 ENCOUNTER — Other Ambulatory Visit: Payer: Self-pay

## 2020-03-23 DIAGNOSIS — Z5189 Encounter for other specified aftercare: Secondary | ICD-10-CM | POA: Diagnosis not present

## 2020-03-23 DIAGNOSIS — I214 Non-ST elevation (NSTEMI) myocardial infarction: Secondary | ICD-10-CM

## 2020-03-23 NOTE — Progress Notes (Signed)
Daily Session Note  Patient Details  Name: Brent [name] MRN: [id number] Date of Birth: Jun 01, 1945 Referring Provider:   Flowsheet Row Cardiac Rehab from 12/16/2019 in [facility] Cardiac and Pulmonary Rehab  Referring Provider [name] MD      Encounter Date: 03/23/2020  Check In:  Session Check In - 03/23/20 0755      Check-In   Supervising physician immediately available to respond to emergencies See telemetry face sheet for immediately available ER MD    Location [facility]-Cardiac & Pulmonary Rehab    Staff Present Birdie Sons, MPA, RN;Amanda Oletta Darter, BA, ACSM CEP, Exercise Physiologist;Kara Eliezer Bottom, MS Exercise Physiologist    Virtual Visit No    Medication changes reported     No    Fall or balance concerns reported    No    Tobacco Cessation Use Increase    Current number of cigarettes/nicotine per day     2    Warm-up and Cool-down Performed on first and last piece of equipment    Resistance Training Performed Yes    VAD Patient? No    PAD/SET Patient? No      Pain Assessment   Currently in Pain? No/denies              Social History   Tobacco Use  Smoking Status Current Every Day Smoker  . Packs/day: 0.15  . Years: 56.00  . Pack years: 8.40  . Types: Cigarettes  . Start date: 1964  Smokeless Tobacco Never Used  Tobacco Comment   New quit date 02/24/20 - made 02/05/20    Goals Met:  Independence with exercise equipment Exercise tolerated well No report of cardiac concerns or symptoms Strength training completed today  Goals Unmet:  Not Applicable  Comments: Pt able to follow exercise prescription today without complaint.  Will continue to monitor for progression.    Dr. Emily Filbert is Medical Director for [place] and [facility].

## 2020-03-25 ENCOUNTER — Other Ambulatory Visit: Payer: Self-pay

## 2020-03-25 DIAGNOSIS — I214 Non-ST elevation (NSTEMI) myocardial infarction: Secondary | ICD-10-CM

## 2020-03-25 DIAGNOSIS — Z5189 Encounter for other specified aftercare: Secondary | ICD-10-CM | POA: Diagnosis not present

## 2020-03-25 NOTE — Progress Notes (Signed)
Daily Session Note  Patient Details  Name: Brent [name] MRN: [id number] Date of Birth: 10-24-1945 Referring Provider:   Flowsheet Row Cardiac Rehab from 12/16/2019 in [facility] Cardiac and Pulmonary Rehab  Referring Provider [name] MD      Encounter Date: 03/25/2020  Check In:  Session Check In - 03/25/20 0751      Check-In   Supervising physician immediately available to respond to emergencies See telemetry face sheet for immediately available ER MD    Location [facility]-Cardiac & Pulmonary Rehab    Staff Present Birdie Sons, MPA, Mauricia Area, BS, ACSM CEP, Exercise Physiologist;Amanda Oletta Darter, BA, ACSM CEP, Exercise Physiologist    Virtual Visit No    Medication changes reported     No    Fall or balance concerns reported    No    Tobacco Cessation No Change    Warm-up and Cool-down Performed on first and last piece of equipment    Resistance Training Performed Yes    VAD Patient? No    PAD/SET Patient? No      Pain Assessment   Currently in Pain? No/denies              Social History   Tobacco Use  Smoking Status Current Every Day Smoker  . Packs/day: 0.15  . Years: 56.00  . Pack years: 8.40  . Types: Cigarettes  . Start date: 1964  Smokeless Tobacco Never Used  Tobacco Comment   New quit date 02/24/20 - made 02/05/20    Goals Met:  Independence with exercise equipment Exercise tolerated well No report of cardiac concerns or symptoms Strength training completed today  Goals Unmet:  Not Applicable  Comments: Pt able to follow exercise prescription today without complaint.  Will continue to monitor for progression.    Dr. Emily Filbert is Medical Director for [place] and [facility].

## 2020-03-30 ENCOUNTER — Other Ambulatory Visit: Payer: Self-pay

## 2020-03-30 DIAGNOSIS — I214 Non-ST elevation (NSTEMI) myocardial infarction: Secondary | ICD-10-CM

## 2020-03-30 DIAGNOSIS — Z5189 Encounter for other specified aftercare: Secondary | ICD-10-CM | POA: Diagnosis not present

## 2020-03-30 NOTE — Progress Notes (Signed)
Daily Session Note  Patient Details  Name: Brent [name] MRN: [id number] Date of Birth: February 05, 1945 Referring Provider:   Flowsheet Row Cardiac Rehab from 12/16/2019 in [facility] Cardiac and Pulmonary Rehab  Referring Provider [name] MD      Encounter Date: 03/30/2020  Check In:  Session Check In - 03/30/20 0813      Check-In   Supervising physician immediately available to respond to emergencies See telemetry face sheet for immediately available ER MD    Location [facility]-Cardiac & Pulmonary Rehab    Staff Present Birdie Sons, MPA, RN;Amanda Oletta Darter, BA, ACSM CEP, Exercise Physiologist;Kara Eliezer Bottom, MS Exercise Physiologist    Virtual Visit No    Medication changes reported     No    Fall or balance concerns reported    No    Tobacco Cessation No Change    Warm-up and Cool-down Performed on first and last piece of equipment    Resistance Training Performed Yes    VAD Patient? No    PAD/SET Patient? No      Pain Assessment   Currently in Pain? No/denies              Social History   Tobacco Use  Smoking Status Current Every Day Smoker  . Packs/day: 0.15  . Years: 56.00  . Pack years: 8.40  . Types: Cigarettes  . Start date: 1964  Smokeless Tobacco Never Used  Tobacco Comment   New quit date 02/24/20 - made 02/05/20    Goals Met:  Independence with exercise equipment Exercise tolerated well No report of cardiac concerns or symptoms Strength training completed today  Goals Unmet:  Not Applicable  Comments: Pt able to follow exercise prescription today without complaint.  Will continue to monitor for progression.    Dr. Emily Filbert is Medical Director for [place] and [facility].

## 2020-03-31 ENCOUNTER — Encounter: Payer: Self-pay | Admitting: *Deleted

## 2020-03-31 DIAGNOSIS — I214 Non-ST elevation (NSTEMI) myocardial infarction: Secondary | ICD-10-CM

## 2020-03-31 NOTE — Progress Notes (Signed)
Cardiac Individual Treatment Plan  Patient Details  Name: Brent [name] MRN: [id number] Date of Birth: 03/10/1945 Referring Provider:   Flowsheet Row Cardiac Rehab from 12/16/2019 in [facility] Cardiac and Pulmonary Rehab  Referring Provider [name] MD      Initial Encounter Date:  Flowsheet Row Cardiac Rehab from 12/16/2019 in [facility] Cardiac and Pulmonary Rehab  Date 12/16/19      Visit Diagnosis: NSTEMI (non-ST elevated myocardial infarction) [facility])  Patient's Home Medications on Admission:  Current Outpatient Medications:  .  acetaminophen (TYLENOL) 325 MG tablet, Take 650 mg by mouth every 6 (six) hours as needed. , Disp: , Rfl:  .  albuterol (VENTOLIN HFA) 108 (90 Base) MCG/ACT inhaler, Inhale 2 puffs into the lungs every 6 (six) hours as needed for wheezing or shortness of breath., Disp: 6.7 g, Rfl: 2 .  aspirin EC 81 MG EC tablet, Take 1 tablet (81 mg total) by mouth daily., Disp: , Rfl:  .  atorvastatin (LIPITOR) 80 MG tablet, Take 1 tablet (80 mg total) by mouth daily., Disp: 90 tablet, Rfl: 1 .  clopidogrel (PLAVIX) 75 MG tablet, Take 1 tablet (75 mg total) by mouth daily with breakfast., Disp: 30 tablet, Rfl: 11 .  gabapentin (NEURONTIN) 100 MG capsule, Take 1 capsule (100 mg total) by mouth daily., Disp: 90 capsule, Rfl: 1 .  metoprolol succinate (TOPROL-XL) 25 MG 24 hr tablet, Take 0.5 tablets (12.5 mg total) by mouth daily., Disp: 30 tablet, Rfl: 11 .  nitroGLYCERIN (NITROSTAT) 0.4 MG SL tablet, Place 1 tablet (0.4 mg total) under the tongue every 5 (five) minutes as needed for chest pain., Disp: 30 tablet, Rfl: 3  Past Medical History: Past Medical History:  Diagnosis Date  . Aortic calcification (HCC) 07/2015   Noted on chest CT with contrast  . Cataract   . Centrilobular emphysema ([place]) 08/03/2015   Noted on chest CT July 2017  . 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[place]) 07/29/2015  . Coronary artery calcification seen on CAT scan 07/29/2015   Noted on CT scan July 2017  .  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 Guillain Barr syndrome ([place])   . History of colonic polyps   . Hyperlipidemia   . Hypochloremia   . Osteopenia 08/29/2016   August 2018; next scan August 2020  . Right Supraclavicular Lymph node 08/03/2015  . Solitary pulmonary nodule 08/03/2015   CT Chest 07/2015 - Subpleural 3 mm left upper lobe pulmonary nodule, probably benign.  . Tobacco abuse     Tobacco Use: Social History   Tobacco Use  Smoking Status Current Every Day Smoker  . Packs/day: 0.15  . Years: 56.00  . Pack years: 8.40  . Types: Cigarettes  . Start date: 1964  Smokeless Tobacco Never Used  Tobacco Comment   New quit date 02/24/20 - made 02/05/20    Labs: Recent Review Flowsheet Data    Labs for ITP Cardiac and Pulmonary Rehab Latest Ref Rng & Units 03/27/2016 05/24/2017 10/28/2018 10/29/2019 12/01/2019   Cholestrol 0 - 200 mg/dL 162 164 187 140 111   LDLCALC 0 - 99 mg/dL 87 94 109(H) 61 57   HDL >40 mg/dL 48 51 59 65 49   Trlycerides <150 mg/dL 134 95 97 59 26   Hemoglobin A1c 4.2 - 6.3 % - - - - -       Exercise Target Goals: Exercise Program Goal: Individual exercise prescription set using results from initial 6 min walk test and  THRR while considering  patient's activity barriers and safety.   Exercise Prescription Goal: Initial exercise prescription builds to 30-45 minutes a day of aerobic activity, 2-3 days per week.  Home exercise guidelines will be given to patient during program as part of exercise prescription that the participant will acknowledge.   Education: Aerobic Exercise: - Group verbal and visual presentation on the components of exercise prescription. Introduces F.I.T.T principle from ACSM for exercise prescriptions.  Reviews F.I.T.T. principles of aerobic exercise including progression. Written material given at graduation. Flowsheet Row Cardiac Rehab from 03/18/2020 in [facility] Cardiac and Pulmonary Rehab  Education need  identified 12/16/19  Date 01/08/20  Educator AS  Instruction Review Code 1- Verbalizes Understanding      Education: Resistance Exercise: - Group verbal and visual presentation on the components of exercise prescription. Introduces F.I.T.T principle from ACSM for exercise prescriptions  Reviews F.I.T.T. principles of resistance exercise including progression. Written material given at graduation. Flowsheet Row Cardiac Rehab from 03/18/2020 in [facility] Cardiac and Pulmonary Rehab  Education need identified 12/16/19  Date 03/04/20  Educator [facility]  Instruction Review Code 1- Verbalizes Understanding       Education: Exercise & Equipment Safety: - Individual verbal instruction and demonstration of equipment use and safety with use of the equipment. Flowsheet Row Cardiac Rehab from 03/18/2020 in [facility] Cardiac and Pulmonary Rehab  Date 12/16/19  Educator [facility]  Instruction Review Code 1- Verbalizes Understanding      Education: Exercise Physiology & General Exercise Guidelines: - Group verbal and written instruction with models to review the exercise physiology of the cardiovascular system and associated critical values. Provides general exercise guidelines with specific guidelines to those with heart or lung disease.  Flowsheet Row Cardiac Rehab from 03/18/2020 in [facility] Cardiac and Pulmonary Rehab  Date 02/19/20  Educator [facility]  Instruction Review Code 1- Verbalizes Understanding      Education: Flexibility, Balance, Mind/Body Relaxation: - Group verbal and visual presentation with interactive activity on the components of exercise prescription. Introduces F.I.T.T principle from ACSM for exercise prescriptions. Reviews F.I.T.T. principles of flexibility and balance exercise training including progression. Also discusses the mind body connection.  Reviews various relaxation techniques to help reduce and manage stress (i.e. Deep breathing, progressive muscle relaxation, and visualization). Balance handout  provided to take home. Written material given at graduation. Flowsheet Row Cardiac Rehab from 03/18/2020 in [facility] Cardiac and Pulmonary Rehab  Date 03/11/20  Educator AS  Instruction Review Code 1- Verbalizes Understanding      Activity Barriers & Risk Stratification:  Activity Barriers & Cardiac Risk Stratification - 12/16/19 1300      Activity Barriers & Cardiac Risk Stratification   Activity Barriers Back Problems;Deconditioning;Muscular Weakness;Shortness of Breath;Balance Concerns    Cardiac Risk Stratification High           6 Minute Walk:  6 Minute Walk    Row Name 12/16/19 1300 03/25/20 0835       6 Minute Walk   Phase Initial Discharge    Distance 1120 feet 1262 feet    Distance % Change -- 12.7 %    Distance Feet Change -- 142 ft    Walk Time 6 minutes 6 minutes    # of Rest Breaks 0 0    MPH 2.12 2.4    METS 3.29 3.37    RPE 12 15    Perceived Dyspnea  1 1    VO2 Peak 11.5 11.8    Symptoms Yes (comment) No    Comments  SOB --    Resting HR 89 bpm 101 bpm    Resting BP 150/74 108/54    Resting Oxygen Saturation  97 % --    Exercise Oxygen Saturation  during 6 min walk 98 % --    Max Ex. HR 105 bpm 129 bpm    Max Ex. BP 154/62 112/54    2 Minute Post BP 132/74 --           Oxygen Initial Assessment:   Oxygen Re-Evaluation:   Oxygen Discharge (Final Oxygen Re-Evaluation):   Initial Exercise Prescription:  Initial Exercise Prescription - 12/16/19 1300      Date of Initial Exercise RX and Referring Provider   Date 12/16/19    Referring Provider [name] MD      Treadmill   MPH 2    Grade 0.5    Minutes 15    METs 2.67      Recumbant Bike   Level 2    RPM 50    Watts 22    Minutes 15    METs 2.5      NuStep   Level 2    SPM 80    Minutes 15    METs 2.5      Recumbant Elliptical   Level 1    RPM 50    Minutes 15    METs 2.5      Prescription Details   Frequency (times per week) 2    Duration Progress to 30 minutes of  continuous aerobic without signs/symptoms of physical distress      Intensity   THRR 40-80% of Max Heartrate 112-135    Ratings of Perceived Exertion 11-13    Perceived Dyspnea 0-4      Progression   Progression Continue to progress workloads to maintain intensity without signs/symptoms of physical distress.      Resistance Training   Training Prescription Yes    Weight 3 lb    Reps 10-15           Perform Capillary Blood Glucose checks as needed.  Exercise Prescription Changes:  Exercise Prescription Changes    Row Name 12/16/19 1300 12/29/19 1600 01/12/20 1200 01/26/20 1100 02/11/20 1400     Response to Exercise   Blood Pressure (Admit) 150/74 98/52 112/60 120/58 126/60   Blood Pressure (Exercise) 154/62 112/56 118/54 120/60 150/64   Blood Pressure (Exit) 132/74 96/56 102/58 112/58 98/56   Heart Rate (Admit) 89 bpm 93 bpm 109 bpm 99 bpm 99 bpm   Heart Rate (Exercise) 105 bpm 102 bpm 127 bpm 127 bpm 118 bpm   Heart Rate (Exit) 90 bpm 88 bpm 103 bpm 105 bpm 95 bpm   Oxygen Saturation (Admit) 97 % -- -- -- --   Oxygen Saturation (Exercise) 98 % -- -- -- --   Rating of Perceived Exertion (Exercise) 12 10 13 14 13    Perceived Dyspnea (Exercise) 1 -- -- -- --   Symptoms SOB none none none none   Comments walk test results first full day of exercise -- -- --   Duration -- Progress to 30 minutes of  aerobic without signs/symptoms of physical distress Continue with 30 min of aerobic exercise without signs/symptoms of physical distress. Continue with 30 min of aerobic exercise without signs/symptoms of physical distress. Continue with 30 min of aerobic exercise without signs/symptoms of physical distress.   Intensity -- THRR unchanged THRR unchanged THRR unchanged THRR unchanged     Progression   Progression --  Continue to progress workloads to maintain intensity without signs/symptoms of physical distress. Continue to progress workloads to maintain intensity without signs/symptoms  of physical distress. Continue to progress workloads to maintain intensity without signs/symptoms of physical distress. Continue to progress workloads to maintain intensity without signs/symptoms of physical distress.   Average METs -- 2.9 2.2 2.56 2.35     Resistance Training   Training Prescription -- Yes Yes Yes Yes   Weight -- 3 lb 3 lb 5 lb 5 lb   Reps -- 10-15 10-15 10-15 10-15     Interval Training   Interval Training -- No No No No     Treadmill   MPH -- -- 1.7 1.7 2   Grade -- -- 1 1 0.5   Minutes -- -- 15 15 15    METs -- -- 2.54 2.54 2.81     Recumbant Bike   Level -- 2 -- 2 --   Minutes -- 15 -- 15 --     NuStep   Level -- 2 -- 4 --   Minutes -- 15 -- 15 --   METs -- 2.9 -- 3 --     Recumbant Elliptical   Level -- -- 1 1 1.5   RPM -- -- 50 -- 50   Minutes -- -- 15 15 15    METs -- -- 1.8 2.1 1.9   Row Name 02/25/20 1300 03/08/20 1200 03/24/20 1100         Response to Exercise   Blood Pressure (Admit) 118/64 116/54 114/58     Blood Pressure (Exercise) 120/62 120/62 130/60     Blood Pressure (Exit) 104/58 112/58 112/58     Heart Rate (Admit) 96 bpm 71 bpm 92 bpm     Heart Rate (Exercise) 113 bpm 119 bpm 124 bpm     Heart Rate (Exit) 86 bpm 81 bpm 91 bpm     Rating of Perceived Exertion (Exercise) 13 13 13      Symptoms none none none     Duration Continue with 30 min of aerobic exercise without signs/symptoms of physical distress. Continue with 30 min of aerobic exercise without signs/symptoms of physical distress. Continue with 30 min of aerobic exercise without signs/symptoms of physical distress.     Intensity THRR unchanged THRR unchanged THRR unchanged           Progression   Progression Continue to progress workloads to maintain intensity without signs/symptoms of physical distress. Continue to progress workloads to maintain intensity without signs/symptoms of physical distress. Continue to progress workloads to maintain intensity without signs/symptoms  of physical distress.     Average METs 2.68 2.48 2.7           Resistance Training   Training Prescription Yes Yes Yes     Weight 5 lb 5 lb 5 lb     Reps 10-15 10-15 10-15           Interval Training   Interval Training No No No           Treadmill   MPH 2 2 2      Grade 1.5 1.5 2     Minutes 15 15 15      METs 2.94 2.94 3.08           Recumbant Bike   Level 1.5 -- 2.5     Minutes 15 -- 15     METs -- -- 3           NuStep   Level  3 -- 4     Minutes 15 -- 15     METs 3 -- 2.5           Recumbant Elliptical   Level 1.5 1.7 1.5     Minutes 15 15 15      METs 2.1 2 2.2           Home Exercise Plan   Plans to continue exercise at Home (comment)  walking -- Home (comment)  walking     Frequency Add 2 additional days to program exercise sessions. -- Add 2 additional days to program exercise sessions.     Initial Home Exercises Provided 01/13/20 -- 01/13/20            Exercise Comments:  Exercise Comments    Row Name 12/23/19 [phone]           Exercise Comments First full day of exercise!  Patient was oriented to gym and equipment including functions, settings, policies, and procedures.  Patient's individual exercise prescription and treatment plan were reviewed.  All starting workloads were established based on the results of the 6 minute walk test done at initial orientation visit.  The plan for exercise progression was also introduced and progression will be customized based on patient's performance and goals.              Exercise Goals and Review:  Exercise Goals    Row Name 12/16/19 1405             Exercise Goals   Increase Physical Activity Yes       Intervention Provide advice, education, support and counseling about physical activity/exercise needs.;Develop an individualized exercise prescription for aerobic and resistive training based on initial evaluation findings, risk stratification, comorbidities and participant's personal goals.        Expected Outcomes Short Term: Attend rehab on a regular basis to increase amount of physical activity.;Long Term: Add in home exercise to make exercise part of routine and to increase amount of physical activity.;Long Term: Exercising regularly at least 3-5 days a week.       Increase Strength and Stamina Yes       Intervention Provide advice, education, support and counseling about physical activity/exercise needs.;Develop an individualized exercise prescription for aerobic and resistive training based on initial evaluation findings, risk stratification, comorbidities and participant's personal goals.       Expected Outcomes Short Term: Increase workloads from initial exercise prescription for resistance, speed, and METs.;Short Term: Perform resistance training exercises routinely during rehab and add in resistance training at home;Long Term: Improve cardiorespiratory fitness, muscular endurance and strength as measured by increased METs and functional capacity (6MWT)       Able to understand and use rate of perceived exertion (RPE) scale Yes       Intervention Provide education and explanation on how to use RPE scale       Expected Outcomes Short Term: Able to use RPE daily in rehab to express subjective intensity level;Long Term:  Able to use RPE to guide intensity level when exercising independently       Able to understand and use Dyspnea scale Yes       Intervention Provide education and explanation on how to use Dyspnea scale       Expected Outcomes Short Term: Able to use Dyspnea scale daily in rehab to express subjective sense of shortness of breath during exertion;Long Term: Able to use Dyspnea scale to guide intensity level when exercising independently  Knowledge and understanding of Target Heart Rate Range (THRR) Yes       Intervention Provide education and explanation of THRR including how the numbers were predicted and where they are located for reference       Expected Outcomes Short  Term: Able to state/look up THRR;Short Term: Able to use daily as guideline for intensity in rehab;Long Term: Able to use THRR to govern intensity when exercising independently       Able to check pulse independently Yes       Intervention Provide education and demonstration on how to check pulse in carotid and radial arteries.;Review the importance of being able to check your own pulse for safety during independent exercise       Expected Outcomes Short Term: Able to explain why pulse checking is important during independent exercise;Long Term: Able to check pulse independently and accurately       Understanding of Exercise Prescription Yes       Intervention Provide education, explanation, and written materials on patient's individual exercise prescription       Expected Outcomes Short Term: Able to explain program exercise prescription;Long Term: Able to explain home exercise prescription to exercise independently              Exercise Goals Re-Evaluation :  Exercise Goals Re-Evaluation    Row Name 12/23/19 0843 12/29/19 1646 01/12/20 1223 01/13/20 0831 01/26/20 1104     Exercise Goal Re-Evaluation   Exercise Goals Review Able to understand and use rate of perceived exertion (RPE) scale;Knowledge and understanding of Target Heart Rate Range (THRR);Understanding of Exercise Prescription;Able to understand and use Dyspnea scale Increase Physical Activity;Increase Strength and Stamina;Understanding of Exercise Prescription Increase Physical Activity;Increase Strength and Stamina Increase Physical Activity;Increase Strength and Stamina Increase Physical Activity;Increase Strength and Stamina;Understanding of Exercise Prescription   Comments Reviewed RPE and dyspnea scales, THR and program prescription with pt today.  Pt voiced understanding and was given a copy of goals to take home. Minnie has completed his first full day of exercise. He did well and we will continue to monitor his progress. Ayomikun  continues to do well with exercise.  He attends consistently and works at [employer].  Staff will encourage working in [facility] range. Reviewed home exercise with pt today.  Pt plans to walk and use weights for exercise.  Reviewed THR, pulse, RPE, sign and symptoms, pulse oximetery and when to call 911 or MD.  Also discussed weather considerations and indoor options.  Pt voiced understanding. Ozell is doing well in rehab.  He is now up to level 4 on the NuStep.  We will continue to monitor his progress.   Expected Outcomes Short: Use RPE daily to regulate intensity. Long: Follow program prescription in THR. Short: Attend rehab regularly Long: Continue to follow program prescription Short: reach THR range during exercise Long:  improve overall stamina Short: Monitor vitals while exercising Long: Exercise independently at home Short: Add speed back to treadmill Long; Continue to improve stamina   Row Name 02/05/20 0759 02/11/20 1421 02/11/20 1423 02/25/20 1353 03/08/20 1226     Exercise Goal Re-Evaluation   Exercise Goals Review Increase Physical Activity;Increase Strength and Stamina;Understanding of Exercise Prescription Increase Physical Activity;Increase Strength and Stamina -- Increase Physical Activity;Increase Strength and Stamina;Understanding of Exercise Prescription Increase Physical Activity;Increase Strength and Stamina   Comments Aceson reports starting to walk at home and doing weights. He walks at home everyday at home if he can- he walks for 20 minutes (  RPE 13). He has weights at home 3lbs - most days of the week; 15 minutes. -- Adric has increased speed on TM.  He attends consistently and works at lower end of THR range.  Staff will monitor progress. Diontay is doing well in rehab.  He is up level 1.5 on the recumbent ellipitcal.  We will continue to monitor his progress. Harvir continues to progress well.  He is up to level 1.7 on REL and up to 5 lb for weights.   Expected Outcomes Short: Continue to  exercise at home, add warm up and cool down Long; Continue to improve stamina -- Short:  continue exercise at home 1-2 days outside program sessions Long:  build overall strength and stamina Short: Start to increase workloads again.  Long: Continue to improve stamina. Short:  continue to attend consistently Long:  improve overall MET level   Row Name 03/11/20 0744 03/24/20 1111           Exercise Goal Re-Evaluation   Exercise Goals Review Increase Physical Activity;Increase Strength and Stamina;Understanding of Exercise Prescription Increase Physical Activity;Increase Strength and Stamina;Understanding of Exercise Prescription      Comments Breckan is doing well in rehab.  He feels like he is getting stronger and has more stamina.  He is walking and doing some weights on his off days.   He is feeling pretty good overall. Paden has been doing well in rehab.  He is up to 2.2 METs on the recumbent elliptical and using 5 lb weights.  We will continue to monitor his progress.      Expected Outcomes Short: Continue to exercise on off days.  Long: Continue to improve stamina. Short: Increase recumbent bike to level 3.  Long: Continue to improve stamina             Discharge Exercise Prescription (Final Exercise Prescription Changes):  Exercise Prescription Changes - 03/24/20 1100      Response to Exercise   Blood Pressure (Admit) 114/58    Blood Pressure (Exercise) 130/60    Blood Pressure (Exit) 112/58    Heart Rate (Admit) 92 bpm    Heart Rate (Exercise) 124 bpm    Heart Rate (Exit) 91 bpm    Rating of Perceived Exertion (Exercise) 13    Symptoms none    Duration Continue with 30 min of aerobic exercise without signs/symptoms of physical distress.    Intensity THRR unchanged      Progression   Progression Continue to progress workloads to maintain intensity without signs/symptoms of physical distress.    Average METs 2.7      Resistance Training   Training Prescription Yes    Weight 5  lb    Reps 10-15      Interval Training   Interval Training No      Treadmill   MPH 2    Grade 2    Minutes 15    METs 3.08      Recumbant Bike   Level 2.5    Minutes 15    METs 3      NuStep   Level 4    Minutes 15    METs 2.5      Recumbant Elliptical   Level 1.5    Minutes 15    METs 2.2      Home Exercise Plan   Plans to continue exercise at Home (comment)   walking   Frequency Add 2 additional days to program exercise sessions.  Initial Home Exercises Provided 01/13/20           Nutrition:  Target Goals: Understanding of nutrition guidelines, daily intake of sodium <1566m, cholesterol <2031m calories 30% from fat and 7% or less from saturated fats, daily to have 5 or more servings of fruits and vegetables.  Education: All About Nutrition: -Group instruction provided by verbal, written material, interactive activities, discussions, models, and posters to present general guidelines for heart healthy nutrition including fat, fiber, MyPlate, the role of sodium in heart healthy nutrition, utilization of the nutrition label, and utilization of this knowledge for meal planning. Follow up email sent as well. Written material given at graduation. Flow[facility]  Date 03/18/20  [facility] Code 1- Verbalizes Understanding      Biometrics:  Pre Biometrics - 12/16/19 1406      Pre Biometrics   Height 5' 8.9" (1.75 m)    Weight 119 lb (54 kg)    BMI (Calculated) 17.63    Single Leg Stand 8.7 seconds            Nutrition Therapy Plan and Nutrition Goals:  Nutrition Therapy & Goals - 01/12/20 0833      Nutrition Therapy   Diet High kcal/protein    Drug/Food Interactions Statins/Certain Fruits    Protein (specify units) 65g    Fiber 30 grams    Whole Grain Foods 3 servings    Saturated Fats 12 max. grams    Fruits and Vegetables 5 servings/day    Sodium 1.5 grams       Personal Nutrition Goals   Nutrition Goal ST: add peanut butter to breakfast, add another snack or boost/ensure, eat protein foods first, add fat to meals already eating LT: meet nutritional needs    Comments ArKeshavould like to improve his strength and his appetite - lower than his baseline: he rarely gets hungry. B: 11am: oatmeal (sugar, raisins or banana, and whole milk), sausage  D: 6pm: chicken, pork chops, greens, potatoes, red meat 3x/week.  S: peanut butter crackers, potato chips Drinks: coffee - cream and sugar, gatoraide, and water. He eats a couple of tablespoons of food at each of your meals, he was eating a plateful of food at his meals. He reports in the last couple of months his appetite changed - he does not know what caused it, but he believes stress has something to do with it. He reports his weight is now stable (2-3 lbs up and down). Discussed high kcal/high protein nutrition therapy and briefly reviewed heart healthy eating - will focus more when his intake is adequate. RD in November reported he had severe malnutrition.      Intervention Plan   Intervention Prescribe, educate and counsel regarding individualized specific dietary modifications aiming towards targeted core components such as weight, hypertension, lipid management, diabetes, heart failure and other comorbidities.;Nutrition handout(s) given to patient.    Expected Outcomes Short Term Goal: Understand basic principles of dietary content, such as calories, fat, sodium, cholesterol and nutrients.;Short Term Goal: A plan has been developed with personal nutrition goals set during dietitian appointment.;Long Term Goal: Adherence to prescribed nutrition plan.           Nutrition Assessments:  MEDIFICTS Score Key:  ?70 Need to make dietary changes   40-70 Heart Healthy Diet  ? 40 Therapeutic Level Cholesterol Diet  [facility]  Picture Your Plate  Total Score on Admission 45     Picture Your Plate Scores:  <59 Unhealthy dietary pattern with much room for improvement.  41-50 Dietary pattern unlikely to meet recommendations for good health and room for improvement.  51-60 More healthful dietary pattern, with some room for improvement.   >60 Healthy dietary pattern, although there may be some specific behaviors that could be improved.    Nutrition Goals Re-Evaluation:  Nutrition Goals Re-Evaluation    Row Name 02/05/20 0808 03/11/20 0750           Goals   Nutrition Goal ST:  Add fat to meals he already eats (he doesn't eat much butter, but like olive oil which is more heart healthy), add another ensure. LTmeet nutritional needs. ST:  Add fat to meals he already eats (he doesn't eat much butter, but like olive oil which is more heart healthy), add another ensure. LTmeet nutritional needs.      Comment He has added peanut butter to his oatmeal, he is eating his protein foods first. He has not added the ensure, he also hasn't added fat to his meals. Reviewed fats he could add (healhty vs less healthy). Sie is doing well with his diet.  He has days where his appetite is not as good.  He admits to slacking on some of his peanut butter recently.  He will get back to it.  He is doing better with his protein overall.      Expected Outcome ST:  Add fat to meals he already eats (he doesn't eat much butter, but like olive oil which is more heart healthy), add another ensure. LTmeet nutritional needs. Short: Get back to peanut butter again Long: Continue to eat healthier.             Nutrition Goals Discharge (Final Nutrition Goals Re-Evaluation):  Nutrition Goals Re-Evaluation - 03/11/20 0750      Goals   Nutrition Goal ST:  Add fat to meals he already eats (he doesn't eat much butter, but like olive oil which is more heart healthy), add another ensure. LTmeet nutritional needs.    Comment Aris is doing well with his diet.  He has  days where his appetite is not as good.  He admits to slacking on some of his peanut butter recently.  He will get back to it.  He is doing better with his protein overall.    Expected Outcome Short: Get back to peanut butter again Long: Continue to eat healthier.           Psychosocial: Target Goals: Acknowledge presence or absence of significant depression and/or stress, maximize coping skills, provide positive support system. Participant is able to verbalize types and ability to use techniques and skills needed for reducing stress and depression.   Education: Stress, Anxiety, and Depression - Group verbal and visual presentation to define topics covered.  Reviews how body is impacted by stress, anxiety, and depression.  Also discusses healthy ways to reduce stress and to treat/manage anxiety and depression.  Written material given at graduation.   Education: Sleep Hygiene -Provides group verbal and written instruction about how sleep can affect your health.  Define sleep hygiene, discuss sleep cycles and impact of sleep habits. Review good sleep hygiene tips.    Initial Review & Psychosocial Screening:  Initial Psych Review & Screening - 12/12/19 1413      Initial Review   Current issues with Current Stress Concerns    Comments  Quit smoking      Family Dynamics   Good Support System? Yes      Barriers   Psychosocial barriers to participate in program There are no identifiable barriers or psychosocial needs.;The patient should benefit from training in stress management and relaxation.      Screening Interventions   Interventions Encouraged to exercise;To provide support and resources with identified psychosocial needs;Provide feedback about the scores to participant    Expected Outcomes Short Term goal: Utilizing psychosocial counselor, staff and physician to assist with identification of specific Stressors or current issues interfering with healing process. Setting desired goal  for each stressor or current issue identified.;Long Term Goal: Stressors or current issues are controlled or eliminated.;Short Term goal: Identification and review with participant of any Quality of Life or Depression concerns found by scoring the questionnaire.;Long Term goal: The participant improves quality of Life and PHQ9 Scores as seen by post scores and/or verbalization of changes           Quality of Life Scores:   Quality of Life - 12/16/19 1407      Quality of Life   Select Quality of Life      Quality of Life Scores   Health/Function Pre 20.4 %    Socioeconomic Pre 19 %    Psych/Spiritual Pre 23.14 %    Family Pre 26.4 %    GLOBAL Pre 21.64 %          Scores of 19 and below usually indicate a poorer quality of life in these areas.  A difference of  2-3 points is a clinically meaningful difference.  A difference of 2-3 points in the total score of the Quality of Life Index has been associated with significant improvement in overall quality of life, self-image, physical symptoms, and general health in studies assessing change in quality of life.  PHQ-9: Recent Review Flowsheet Data    Depression screen [facility] 2/9 01/29/2020 12/16/2019 10/29/2019 05/20/2019 01/28/2019   Decreased Interest 0 0 2 0 1   Down, Depressed, Hopeless 0 0 2 0 1   PHQ - 2 Score 0 0 4 0 2   Altered sleeping 0 1 3 - 0   Tired, decreased energy 0 1 3 - 1   Change in appetite 0 0 3 - 1   Feeling bad or failure about yourself  0 1 3 - 1   Trouble concentrating 0 0 0 - 0   Moving slowly or fidgety/restless 0 1 2 - 0   Suicidal thoughts 0 1  2 - 0   PHQ-9 Score 0 5 20 - 5   Difficult doing work/chores Not difficult at all Somewhat difficult Somewhat difficult - Not difficult at all     Interpretation of Total Score  Total Score Depression Severity:  1-4 = Minimal depression, 5-9 = Mild depression, 10-14 = Moderate depression, 15-19 = Moderately severe depression, 20-27 = Severe depression   Psychosocial  Evaluation and Intervention:  Psychosocial Evaluation - 12/12/19 1413      Psychosocial Evaluation & Interventions   Comments Bronsyn reports doing well post NSTEMI. He did quit smoking when he was admitted on 11/21. He states it has been a struggle but he's making it work. He was made aware about access to resources to continue on this journey of quitting smoking. He wants to get "overall better health" and hopes to achieve that by coming to cardiac rehab.    Expected Outcomes Short: attend cardiac rehab for education and  exercise. Long: develop positive self care habits.    Continue Psychosocial Services  Follow up required by staff           Psychosocial Re-Evaluation:  Psychosocial Re-Evaluation    Row Name 01/13/20 [phone] 01/29/20 0853 02/05/20 0801 03/11/20 0746       Psychosocial Re-Evaluation   Current issues with Current Stress Concerns Current Depression -- History of Depression;Current Stress Concerns    Comments Shirley is still working on quitting smoking.  He reports no signs of anxiety or depression.  He doesnt sleep well.  He has trouble staying asleep.  Staff recommended he talk to his Dr.  and use breathing, music or reading to help fall asleep. Bren is doing well in rehab.  His repeat PHQ is down to 0 now. He attributes it to getting out more and feeling better. He reports feeling ok - he is not on any medication. He reports this month has been bad for him - the snow (his generator gave out), his water heater gave out. He reports he has support in his wife. He manages his stress by singing and reading. He is trying to reduce his smoking, his quit date was 01/17/20, but the stress of this month hurt his progress; he is goign to try to quit 2/15.He reports that he gets frustrated being unable to lift certain things now. Mehdi is doing well.  He denies any depression symptoms currently.  He is still reeling some from family.  He has had to move family into rest homes and all the paper  work that goes along with it.  He feels that everyone depends on him.  He is still smoking some and down to 2-3 a day.  He uses it as a vice to cope.    Expected Outcomes Short: talk to Dr about sleep Long: develop better sleep patterns Short: Continue to get back to exercise Long: Continue feel better. Short: continue to use stress management habits, new smoking quit date 2/15, increase muscle to help with ADLs Long: Continue feel better overall Short: Continue to work towards quitting smoking Long: Continue to focus on positive.    Interventions -- Encouraged to attend Cardiac Rehabilitation for the exercise Encouraged to attend Cardiac Rehabilitation for the exercise Encouraged to attend Cardiac Rehabilitation for the exercise;Stress management education    Continue Psychosocial Services  -- Follow up required by staff Follow up required by staff Follow up required by staff    Comments -- -- House problems during the winter. --         Initial Review   Source of Stress Concerns -- -- Financial;Unable to perform yard/household activities --           Psychosocial Discharge (Final Psychosocial Re-Evaluation):  Psychosocial Re-Evaluation - 03/11/20 0746      Psychosocial Re-Evaluation   Current issues with History of Depression;Current Stress Concerns    Comments Drelyn is doing well.  He denies any depression symptoms currently.  He is still reeling some from family.  He has had to move family into rest homes and all the paper work that goes along with it.  He feels that everyone depends on him.  He is still smoking some and down to 2-3 a day.  He uses it as a vice to cope.    Expected Outcomes Short: Continue to work towards quitting smoking Long: Continue to focus on positive.    Interventions Encouraged to attend Cardiac Rehabilitation for the exercise;Stress management education  Continue Psychosocial Services  Follow up required by staff           Vocational  Rehabilitation: Provide vocational rehab assistance to qualifying candidates.   Vocational Rehab Evaluation & Intervention:  Vocational Rehab - 12/12/19 1413      Initial Vocational Rehab Evaluation & Intervention   Assessment shows need for Vocational Rehabilitation No           Education: Education Goals: Education classes will be provided on a variety of topics geared toward better understanding of heart health and risk factor modification. Participant will state understanding/return demonstration of topics presented as noted by education test scores.  Learning Barriers/Preferences:  Learning Barriers/Preferences - 12/12/19 1413      Learning Barriers/Preferences   Learning Barriers None    Learning Preferences None           General Cardiac Education Topics:  AED/CPR: - Group verbal and written instruction with the use of models to demonstrate the basic use of the AED with the basic ABC's of resuscitation.   Anatomy and Cardiac Procedures: - Group verbal and visual presentation and models provide information about basic cardiac anatomy and function. Reviews the testing methods done to diagnose heart disease and the outcomes of the test results. Describes the treatment choices: Medical Management, Angioplasty, or Coronary Bypass Surgery for treating various heart conditions including Myocardial Infarction, Angina, Valve Disease, and Cardiac Arrhythmias.  Written material given at graduation. Flowsheet Row Cardiac Rehab from 03/18/2020 in [facility] Cardiac and Pulmonary Rehab  Date 03/04/20  Educator SB  Instruction Review Code 1- Verbalizes Understanding      Medication Safety: - Group verbal and visual instruction to review commonly prescribed medications for heart and lung disease. Reviews the medication, class of the drug, and side effects. Includes the steps to properly store meds and maintain the prescription regimen.  Written material given at graduation. Flowsheet Row  Cardiac Rehab from 03/18/2020 in [facility] Cardiac and Pulmonary Rehab  Date 01/29/20  Educator SB  Instruction Review Code 1- Verbalizes Understanding      Intimacy: - Group verbal instruction through game format to discuss how heart and lung disease can affect sexual intimacy. Written material given at graduation.. Flowsheet Row Cardiac Rehab from 03/18/2020 in [facility] Cardiac and Pulmonary Rehab  Date 01/08/20  Educator as  Instruction Review Code 1- Verbalizes Understanding      Know Your Numbers and Heart Failure: - Group verbal and visual instruction to discuss disease risk factors for cardiac and pulmonary disease and treatment options.  Reviews associated critical values for Overweight/Obesity, Hypertension, Cholesterol, and Diabetes.  Discusses basics of heart failure: signs/symptoms and treatments.  Introduces Heart Failure Zone chart for action plan for heart failure.  Written material given at graduation.   Infection Prevention: - Provides verbal and written material to individual with discussion of infection control including proper hand washing and proper equipment cleaning during exercise session. Flowsheet Row Cardiac Rehab from 03/18/2020 in [facility] Cardiac and Pulmonary Rehab  Date 12/16/19  Educator [facility]  Instruction Review Code 1- Verbalizes Understanding      Falls Prevention: - Provides verbal and written material to individual with discussion of falls prevention and safety. Flowsheet Row Cardiac Rehab from 03/18/2020 in [facility] Cardiac and Pulmonary Rehab  Date 12/16/19  Educator [facility]  Instruction Review Code 1- Verbalizes Understanding      Other: -Provides group and verbal instruction on various topics (see comments)   Knowledge Questionnaire Score:  Knowledge Questionnaire Score - 12/16/19 1408  Knowledge Questionnaire Score   Pre Score 24/26 Education focus: exercise           Core Components/Risk Factors/Patient Goals at Admission:  Personal Goals and  Risk Factors at Admission - 12/16/19 1409      Core Components/Risk Factors/Patient Goals on Admission    Weight Management Yes;Weight Gain    Intervention Weight Management: Develop a combined nutrition and exercise program designed to reach desired caloric intake, while maintaining appropriate intake of nutrient and fiber, sodium and fats, and appropriate energy expenditure required for the weight goal.;Weight Management: Provide education and appropriate resources to help participant work on and attain dietary goals.    Admit Weight 119 lb (54 kg)    Goal Weight: Short Term 122 lb (55.3 kg)    Goal Weight: Long Term 125 lb (56.7 kg)    Expected Outcomes Short Term: Continue to assess and modify interventions until short term weight is achieved;Long Term: Adherence to nutrition and physical activity/exercise program aimed toward attainment of established weight goal;Weight Gain: Understanding of general recommendations for a high calorie, high protein meal plan that promotes weight gain by distributing calorie intake throughout the day with the consumption for 4-5 meals, snacks, and/or supplements    Tobacco Cessation Yes    Intervention Assist the participant in steps to quit. Provide individualized education and counseling about committing to Tobacco Cessation, relapse prevention, and pharmacological support that can be provided by physician.;Advice worker, assist with locating and accessing local/national Quit Smoking programs, and support quit date choice.    Expected Outcomes Short Term: Will quit all tobacco product use, adhering to prevention of relapse plan.;Long Term: Complete abstinence from all tobacco products for at least 12 months from quit date.    Hypertension Yes    Intervention Provide education on lifestyle modifcations including regular physical activity/exercise, weight management, moderate sodium restriction and increased consumption of fresh fruit, vegetables,  and low fat dairy, alcohol moderation, and smoking cessation.;Monitor prescription use compliance.    Expected Outcomes Long Term: Maintenance of blood pressure at goal levels.;Short Term: Continued assessment and intervention until BP is < 140/21m HG in hypertensive participants. < 130/872mHG in hypertensive participants with diabetes, heart failure or chronic kidney disease.    Lipids Yes    Intervention Provide education and support for participant on nutrition & aerobic/resistive exercise along with prescribed medications to achieve LDL <7011mHDL >13m41m  Expected Outcomes Short Term: Participant states understanding of desired cholesterol values and is compliant with medications prescribed. Participant is following exercise prescription and nutrition guidelines.;Long Term: Cholesterol controlled with medications as prescribed, with individualized exercise RX and with personalized nutrition plan. Value goals: LDL < 70mg62mL > 40 mg.           Education:Diabetes - Individual verbal and written instruction to review signs/symptoms of diabetes, desired ranges of glucose level fasting, after meals and with exercise. Acknowledge that pre and post exercise glucose checks will be done for 3 sessions at entry of program.   Core Components/Risk Factors/Patient Goals Review:   Goals and Risk Factor Review    Row Name 01/13/20 0820 02/05/20 0813 03/11/20 0748         Core Components/Risk Factors/Patient Goals Review   Personal Goals Review Hypertension;Tobacco Cessation Hypertension;Tobacco Cessation Hypertension;Tobacco Cessation;Weight Management/Obesity     Review [place] plan to quit smoking.  He is down to about 3 cigarettes per day.  he does have [employer] is trying to quit  cold [place].  He says his wife is his "quit coach" at home.  he takes meds as directed most of the time - he missed yesterday due to power outage. He is trying to reduce his smoking (stil 3  cigarettes/day), his quit date was 01/17/20, but the stress of this month hurt his progress; he is goign to try to quit 2/15. He has the [employer]. He is still taking his medications as directed. Travonte is down 2-3 cigarettes a day. He is pleased with how far he has come.  He is not ready to set a date as this is his vice to cope with ongoing stressors.  He was commended for his progress and continues to work toward it.  His weight is holding steady and can vary some based on how his appetitie is doing.  His blood pressures are doing well and he continues to check them.     Expected Outcomes Short: set another quit date Long: quit smoking altogether Short: new quit dat 2/15 Long: quit smoking altogether Short: Continue to work towards quitting smoking Long: Continue to monitor risk factors.            Core Components/Risk Factors/Patient Goals at Discharge (Final Review):   Goals and Risk Factor Review - 03/11/20 0748      Core Components/Risk Factors/Patient Goals Review   Personal Goals Review Hypertension;Tobacco Cessation;Weight Management/Obesity    Review Verbon is down 2-3 cigarettes a day. He is pleased with how far he has come.  He is not ready to set a date as this is his vice to cope with ongoing stressors.  He was commended for his progress and continues to work toward it.  His weight is holding steady and can vary some based on how his appetitie is doing.  His blood pressures are doing well and he continues to check them.    Expected Outcomes Short: Continue to work towards quitting smoking Long: Continue to monitor risk factors.           ITP Comments:  ITP Comments    Row Name 12/12/19 1403 12/16/19 1149 12/23/19 0842 01/07/20 0731 01/12/20 0858   ITP Comments Initial orientation completed. Diagnosis can be found in [facility] 11/21. EP orientation scheduled for Tuesday, 12/7 at 9am. Completed 6MWT and gym orientation. Initial ITP created and sent for review to Dr. Emily Filbert, Medical Director. First full day of exercise!  Patient was oriented to gym and equipment including functions, settings, policies, and procedures.  Patient's individual exercise prescription and treatment plan were reviewed.  All starting workloads were established based on the results of the 6 minute walk test done at initial orientation visit.  The plan for exercise progression was also introduced and progression will be customized based on patient's performance and goals. 30 Day review completed. Medical Director ITP review done, changes made as directed, and signed approval by Medical Director. Completed initial RD Evaluation   Row Name 02/04/20 1002 03/03/20 0719 03/31/20 1133       ITP Comments 30 Day review completed. Medical Director ITP review done, changes made as directed, and signed approval by Medical Director. 30 Day review completed. Medical Director ITP review done, changes made as directed, and signed approval by Medical Director. 30 Day review completed. Medical Director ITP review done, changes made as directed, and signed approval by Medical Director.            Comments:

## 2020-04-01 ENCOUNTER — Ambulatory Visit: Payer: Medicare PPO | Admitting: Cardiovascular Disease

## 2020-04-01 ENCOUNTER — Other Ambulatory Visit: Payer: Self-pay

## 2020-04-01 DIAGNOSIS — Z5189 Encounter for other specified aftercare: Secondary | ICD-10-CM | POA: Diagnosis not present

## 2020-04-01 DIAGNOSIS — I214 Non-ST elevation (NSTEMI) myocardial infarction: Secondary | ICD-10-CM

## 2020-04-01 NOTE — Progress Notes (Signed)
Daily Session Note  Patient Details  Name: Brent [name] MRN: [id number] Date of Birth: 05/01/1945 Referring Provider:   Flowsheet Row Cardiac Rehab from 12/16/2019 in [facility] Cardiac and Pulmonary Rehab  Referring Provider [name] MD      Encounter Date: 04/01/2020  Check In:  Session Check In - 04/01/20 0749      Check-In   Supervising physician immediately available to respond to emergencies See telemetry face sheet for immediately available ER MD    Location [facility]-Cardiac & Pulmonary Rehab    Staff Present Birdie Sons, MPA, Mauricia Area, BS, ACSM CEP, Exercise Physiologist;Amanda Oletta Darter, BA, ACSM CEP, Exercise Physiologist    Virtual Visit No    Medication changes reported     No    Fall or balance concerns reported    No    Tobacco Cessation No Change    Current number of cigarettes/nicotine per day     2    Warm-up and Cool-down Performed on first and last piece of equipment    Resistance Training Performed Yes    VAD Patient? No    PAD/SET Patient? No      Pain Assessment   Currently in Pain? No/denies              Social History   Tobacco Use  Smoking Status Current Every Day Smoker  . Packs/day: 0.15  . Years: 56.00  . Pack years: 8.40  . Types: Cigarettes  . Start date: 1964  Smokeless Tobacco Never Used  Tobacco Comment   New quit date 02/24/20 - made 02/05/20    Goals Met:  Independence with exercise equipment Exercise tolerated well No report of cardiac concerns or symptoms Strength training completed today  Goals Unmet:  Not Applicable  Comments: Pt able to follow exercise prescription today without complaint.  Will continue to monitor for progression.    Dr. Emily Filbert is Medical Director for [facility].

## 2020-04-06 ENCOUNTER — Other Ambulatory Visit: Payer: Self-pay

## 2020-04-06 DIAGNOSIS — I214 Non-ST elevation (NSTEMI) myocardial infarction: Secondary | ICD-10-CM

## 2020-04-06 DIAGNOSIS — Z5189 Encounter for other specified aftercare: Secondary | ICD-10-CM | POA: Diagnosis not present

## 2020-04-06 NOTE — Patient Instructions (Signed)
Discharge Patient Instructions  Patient Details  Name: Brent [name] MRN: [id number] Date of Birth: 04/01/1945 Referring Provider:  Wellington Hampshire, MD   Number of Visits: 32  Reason for Discharge:  Patient reached a stable level of exercise. Patient independent in their exercise. Patient has met program and personal goals.  Smoking History:  Social History   Tobacco Use  Smoking Status Current Every Day Smoker  . Packs/day: 0.15  . Years: 56.00  . Pack years: 8.40  . Types: Cigarettes  . Start date: 1964  Smokeless Tobacco Never Used  Tobacco Comment   New quit date 02/24/20 - made 02/05/20    Diagnosis:  NSTEMI (non-ST elevated myocardial infarction) [facility])  Initial Exercise Prescription:  Initial Exercise Prescription - 12/16/19 1300      Date of Initial Exercise RX and Referring Provider   Date 12/16/19    Referring Provider [name] MD      Treadmill   MPH 2    Grade 0.5    Minutes 15    METs 2.67      Recumbant Bike   Level 2    RPM 50    Watts 22    Minutes 15    METs 2.5      NuStep   Level 2    SPM 80    Minutes 15    METs 2.5      Recumbant Elliptical   Level 1    RPM 50    Minutes 15    METs 2.5      Prescription Details   Frequency (times per week) 2    Duration Progress to 30 minutes of continuous aerobic without signs/symptoms of physical distress      Intensity   THRR 40-80% of Max Heartrate 112-135    Ratings of Perceived Exertion 11-13    Perceived Dyspnea 0-4      Progression   Progression Continue to progress workloads to maintain intensity without signs/symptoms of physical distress.      Resistance Training   Training Prescription Yes    Weight 3 lb    Reps 10-15           Discharge Exercise Prescription (Final Exercise Prescription Changes):  Exercise Prescription Changes - 03/24/20 1100      Response to Exercise   Blood Pressure (Admit) 114/58    Blood Pressure (Exercise) 130/60    Blood  Pressure (Exit) 112/58    Heart Rate (Admit) 92 bpm    Heart Rate (Exercise) 124 bpm    Heart Rate (Exit) 91 bpm    Rating of Perceived Exertion (Exercise) 13    Symptoms none    Duration Continue with 30 min of aerobic exercise without signs/symptoms of physical distress.    Intensity THRR unchanged      Progression   Progression Continue to progress workloads to maintain intensity without signs/symptoms of physical distress.    Average METs 2.7      Resistance Training   Training Prescription Yes    Weight 5 lb    Reps 10-15      Interval Training   Interval Training No      Treadmill   MPH 2    Grade 2    Minutes 15    METs 3.08      Recumbant Bike   Level 2.5    Minutes 15    METs 3      NuStep   Level 4  Minutes 15    METs 2.5      Recumbant Elliptical   Level 1.5    Minutes 15    METs 2.2      Home Exercise Plan   Plans to continue exercise at Home (comment)   walking   Frequency Add 2 additional days to program exercise sessions.    Initial Home Exercises Provided 01/13/20           Functional Capacity:  6 Minute Walk    Row Name 12/16/19 1300 03/25/20 0835       6 Minute Walk   Phase Initial Discharge    Distance 1120 feet 1262 feet    Distance % Change -- 12.7 %    Distance Feet Change -- 142 ft    Walk Time 6 minutes 6 minutes    # of Rest Breaks 0 0    MPH 2.12 2.4    METS 3.29 3.37    RPE 12 15    Perceived Dyspnea  1 1    VO2 Peak 11.5 11.8    Symptoms Yes (comment) No    Comments SOB --    Resting HR 89 bpm 101 bpm    Resting BP 150/74 108/54    Resting Oxygen Saturation  97 % --    Exercise Oxygen Saturation  during 6 min walk 98 % --    Max Ex. HR 105 bpm 129 bpm    Max Ex. BP 154/62 112/54    2 Minute Post BP 132/74 --            Nutrition:  Nutrition Therapy & Goals - 01/12/20 0833      Nutrition Therapy   Diet High kcal/protein    Drug/Food Interactions Statins/Certain Fruits    Protein (specify units) 65g     Fiber 30 grams    Whole Grain Foods 3 servings    Saturated Fats 12 max. grams    Fruits and Vegetables 5 servings/day    Sodium 1.5 grams      Personal Nutrition Goals   Nutrition Goal ST: add peanut butter to breakfast, add another snack or boost/ensure, eat protein foods first, add fat to meals already eating LT: meet nutritional needs    Comments Gorman would like to improve his strength and his appetite - lower than his baseline: he rarely gets hungry. B: 11am: oatmeal (sugar, raisins or banana, and whole milk), sausage  D: 6pm: chicken, pork chops, greens, potatoes, red meat 3x/week.  S: peanut butter crackers, potato chips Drinks: coffee - cream and sugar, gatoraide, and water. He eats a couple of tablespoons of food at each of your meals, he was eating a plateful of food at his meals. He reports in the last couple of months his appetite changed - he does not know what caused it, but he believes stress has something to do with it. He reports his weight is now stable (2-3 lbs up and down). Discussed high kcal/high protein nutrition therapy and briefly reviewed heart healthy eating - will focus more when his intake is adequate. RD in November reported he had severe malnutrition.      Intervention Plan   Intervention Prescribe, educate and counsel regarding individualized specific dietary modifications aiming towards targeted core components such as weight, hypertension, lipid management, diabetes, heart failure and other comorbidities.;Nutrition handout(s) given to patient.    Expected Outcomes Short Term Goal: Understand basic principles of dietary content, such as calories, fat, sodium, cholesterol and nutrients.;Short Term  Goal: A plan has been developed with personal nutrition goals set during dietitian appointment.;Long Term Goal: Adherence to prescribed nutrition plan.           Goals reviewed with patient; copy given to patient.

## 2020-04-06 NOTE — Progress Notes (Signed)
Daily Session Note  Patient Details  Name: Brent [name] MRN: [id number] Date of Birth: 08/03/1945 Referring Provider:   Flowsheet Row Cardiac Rehab from 12/16/2019 in [facility] Cardiac and Pulmonary Rehab  Referring Provider [name] MD      Encounter Date: 04/06/2020  Check In:  Session Check In - 04/06/20 0744      Check-In   Supervising physician immediately available to respond to emergencies See telemetry face sheet for immediately available ER MD    Location [facility]-Cardiac & Pulmonary Rehab    Staff Present Birdie Sons, MPA, RN;Amanda Oletta Darter, BA, ACSM CEP, Exercise Physiologist;Joseph Tessie Fass RCP,RRT,BSRT    Virtual Visit No    Medication changes reported     No    Fall or balance concerns reported    No    Tobacco Cessation No Change    Warm-up and Cool-down Performed on first and last piece of equipment    Resistance Training Performed Yes    VAD Patient? No    PAD/SET Patient? No      Pain Assessment   Currently in Pain? No/denies              Social History   Tobacco Use  Smoking Status Current Every Day Smoker  . Packs/day: 0.15  . Years: 56.00  . Pack years: 8.40  . Types: Cigarettes  . Start date: 1964  Smokeless Tobacco Never Used  Tobacco Comment   New quit date 02/24/20 - made 02/05/20    Goals Met:  Independence with exercise equipment Exercise tolerated well No report of cardiac concerns or symptoms Strength training completed today  Goals Unmet:  Not Applicable  Comments: Pt able to follow exercise prescription today without complaint.  Will continue to monitor for progression.    Dr. Emily Filbert is Medical Director for [place] and [facility].

## 2020-04-08 ENCOUNTER — Other Ambulatory Visit: Payer: Self-pay

## 2020-04-08 DIAGNOSIS — I214 Non-ST elevation (NSTEMI) myocardial infarction: Secondary | ICD-10-CM

## 2020-04-08 DIAGNOSIS — Z5189 Encounter for other specified aftercare: Secondary | ICD-10-CM | POA: Diagnosis not present

## 2020-04-08 NOTE — Progress Notes (Signed)
Daily Session Note  Patient Details  Name: Brent [name] MRN: [id number] Date of Birth: October 04, 1945 Referring Provider:   Flowsheet Row Cardiac Rehab from 12/16/2019 in [facility] Cardiac and Pulmonary Rehab  Referring Provider Brent Sacramento MD      Encounter Date: 04/08/2020  Check In:  Session Check In - 04/08/20 0751      Check-In   Supervising physician immediately available to respond to emergencies See telemetry face sheet for immediately available ER MD    Location [facility]-Cardiac & Pulmonary Rehab    Staff Present Brent [name], MPA, Brent [name], BS, ACSM CEP, Exercise Physiologist;Brent Mel Almond, RN, BSN    Virtual Visit No    Medication changes reported     No    Fall or balance concerns reported    No    Tobacco Cessation No Change    Warm-up and Cool-down Performed on first and last piece of equipment    Resistance Training Performed Yes    VAD Patient? No    PAD/SET Patient? No      Pain Assessment   Currently in Pain? No/denies              Social History   Tobacco Use  Smoking Status Current Every Day Smoker  . Packs/day: 0.15  . Years: 56.00  . Pack years: 8.40  . Types: Cigarettes  . Start date: 1964  Smokeless Tobacco Never Used  Tobacco Comment   New quit date 02/24/20 - made 02/05/20    Goals Met:  Independence with exercise equipment Exercise tolerated well No report of cardiac concerns or symptoms Strength training completed today  Goals Unmet:  Not Applicable  Comments:  Del graduated today from  rehab with 36 sessions completed.  Details of the patient's exercise prescription and what He needs to do in order to continue the prescription and progress were discussed with patient.  Patient was given a copy of prescription and goals.  Patient verbalized understanding.  [name] plans to continue to exercise by walking at home.    Dr. Emily [name] is Medical Director for [place] [facility].

## 2020-04-08 NOTE — Progress Notes (Signed)
Discharge Progress Report  Patient Details  Name: Brent [name] MRN: [id number] Date of Birth: 11/27/1945 Referring Provider:   Flowsheet Row Cardiac Rehab from 12/16/2019 in [facility] Cardiac and Pulmonary Rehab  Referring Provider [name] MD       Number of Visits: 36  Reason for Discharge:  Patient reached a stable level of exercise. Patient independent in their exercise. Patient has met program and personal goals.  Smoking History:  Social History   Tobacco Use  Smoking Status Current Every Day Smoker  . Packs/day: 0.15  . Years: 56.00  . Pack years: 8.40  . Types: Cigarettes  . Start date: 1964  Smokeless Tobacco Never Used  Tobacco Comment   New quit date 02/24/20 - made 02/05/20    Diagnosis:  NSTEMI (non-ST elevated myocardial infarction) [facility])  ADL [facility]:   Initial Exercise Prescription:  Initial Exercise Prescription - 12/16/19 1300      Date of Initial Exercise RX and Referring Provider   Date 12/16/19    Referring Provider [name] MD      Treadmill   MPH 2    Grade 0.5    Minutes 15    METs 2.67      Recumbant Bike   Level 2    RPM 50    Watts 22    Minutes 15    METs 2.5      NuStep   Level 2    SPM 80    Minutes 15    METs 2.5      Recumbant Elliptical   Level 1    RPM 50    Minutes 15    METs 2.5      Prescription Details   Frequency (times per week) 2    Duration Progress to 30 minutes of continuous aerobic without signs/symptoms of physical distress      Intensity   THRR 40-80% of Max Heartrate 112-135    Ratings of Perceived Exertion 11-13    Perceived Dyspnea 0-4      Progression   Progression Continue to progress workloads to maintain intensity without signs/symptoms of physical distress.      Resistance Training   Training Prescription Yes    Weight 3 lb    Reps 10-15           Discharge Exercise Prescription (Final Exercise Prescription Changes):  Exercise Prescription Changes - 04/06/20  1400      Response to Exercise   Blood Pressure (Admit) 120/62    Blood Pressure (Exercise) 126/76    Blood Pressure (Exit) 110/60    Heart Rate (Admit) 87 bpm    Heart Rate (Exercise) 119 bpm    Heart Rate (Exit) 95 bpm    Rating of Perceived Exertion (Exercise) 15    Symptoms none    Duration Continue with 30 min of aerobic exercise without signs/symptoms of physical distress.      Progression   Progression Continue to progress workloads to maintain intensity without signs/symptoms of physical distress.    Average METs 2.7      Resistance Training   Training Prescription Yes    Weight 5 lb    Reps 10-15      Interval Training   Interval Training No      Treadmill   MPH 2    Grade 2.5    Minutes 15    METs 3.22      Recumbant Elliptical   Level 2    METs 2.2  Home Exercise Plan   Plans to continue exercise at Home (comment)   walking   Frequency Add 2 additional days to program exercise sessions.    Initial Home Exercises Provided 01/13/20           Functional Capacity:  6 Minute Walk    Row Name 12/16/19 1300 03/25/20 0835       6 Minute Walk   Phase Initial Discharge    Distance 1120 feet 1262 feet    Distance % Change -- 12.7 %    Distance Feet Change -- 142 ft    Walk Time 6 minutes 6 minutes    # of Rest Breaks 0 0    MPH 2.12 2.4    METS 3.29 3.37    RPE 12 15    Perceived Dyspnea  1 1    VO2 Peak 11.5 11.8    Symptoms Yes (comment) No    Comments SOB --    Resting HR 89 bpm 101 bpm    Resting BP 150/74 108/54    Resting Oxygen Saturation  97 % --    Exercise Oxygen Saturation  during 6 min walk 98 % --    Max Ex. HR 105 bpm 129 bpm    Max Ex. BP 154/62 112/54    2 Minute Post BP 132/74 --           Psychological, QOL, Others - Outcomes: PHQ 2/9: Depression screen [facility] 2/9 04/08/2020 01/29/2020 12/16/2019 10/29/2019 05/20/2019  Decreased Interest 2 0 0 2 0  Down, Depressed, Hopeless 2 0 0 2 0  PHQ - 2 Score 4 0 0 4 0  Altered  sleeping 3 0 1 3 -  Tired, decreased energy 2 0 1 3 -  Change in appetite 2 0 0 3 -  Feeling bad or failure about yourself  3 0 1 3 -  Trouble concentrating 2 0 0 0 -  Moving slowly or fidgety/restless 2 0 1 2 -  Suicidal thoughts 1 0 1 2 -  PHQ-9 Score 19 0 5 20 -  Difficult doing work/chores Somewhat difficult Not difficult at all Somewhat difficult Somewhat difficult -  Some recent data might be hidden    Quality of Life:  Quality of Life - 04/08/20 0951      Quality of Life Scores   Health/Function Pre 20.4 %    Health/Function Post 23.87 %    Health/Function % Change 17.01 %    Socioeconomic Pre 19 %    Socioeconomic Post 24.69 %    Socioeconomic % Change  29.95 %    Psych/Spiritual Pre 23.14 %    Psych/Spiritual Post 30 %    Psych/Spiritual % Change 29.65 %    Family Pre 26.4 %    Family Post 21.2 %    Family % Change -19.7 %    GLOBAL Pre 21.64 %    GLOBAL Post 24.9 %    GLOBAL % Change 15.06 %           Nutrition & Weight - Outcomes:  Pre Biometrics - 12/16/19 1406      Pre Biometrics   Height 5' 8.9" (1.75 m)    Weight 119 lb (54 kg)    BMI (Calculated) 17.63    Single Leg Stand 8.7 seconds            Nutrition:  Nutrition Therapy & Goals - 01/12/20 0833      Nutrition Therapy   Diet High kcal/protein  Drug/Food Interactions Statins/Certain Fruits    Protein (specify units) 65g    Fiber 30 grams    Whole Grain Foods 3 servings    Saturated Fats 12 max. grams    Fruits and Vegetables 5 servings/day    Sodium 1.5 grams      Personal Nutrition Goals   Nutrition Goal ST: add peanut butter to breakfast, add another snack or boost/ensure, eat protein foods first, add fat to meals already eating LT: meet nutritional needs    Comments Brent [name] would like to improve his strength and his appetite - lower than his baseline: he rarely gets hungry. B: 11am: oatmeal (sugar, raisins or banana, and whole milk), sausage  D: 6pm: chicken, pork chops, greens,  potatoes, red meat 3x/week.  S: peanut butter crackers, potato chips Drinks: coffee - cream and sugar, gatoraide, and water. He eats a couple of tablespoons of food at each of your meals, he was eating a plateful of food at his meals. He reports in the last couple of months his appetite changed - he does not know what caused it, but he believes stress has something to do with it. He reports his weight is now stable (2-3 lbs up and down). Discussed high kcal/high protein nutrition therapy and briefly reviewed heart healthy eating - will focus more when his intake is adequate. RD in November reported he had severe malnutrition.      Intervention Plan   Intervention Prescribe, educate and counsel regarding individualized specific dietary modifications aiming towards targeted core components such as weight, hypertension, lipid management, diabetes, heart failure and other comorbidities.;Nutrition handout(s) given to patient.    Expected Outcomes Short Term Goal: Understand basic principles of dietary content, such as calories, fat, sodium, cholesterol and nutrients.;Short Term Goal: A plan has been developed with personal nutrition goals set during dietitian appointment.;Long Term Goal: Adherence to prescribed nutrition plan.           Nutrition Discharge:   Education Questionnaire Score:  Knowledge Questionnaire Score - 04/08/20 0948      Knowledge Questionnaire Score   Pre Score 24/26 Education focus: exercise    Post Score 21/26           Goals reviewed with patient; copy given to patient.

## 2020-04-08 NOTE — Progress Notes (Signed)
Cardiac Individual Treatment Plan  Patient Details  Name: Brent [name] MRN: [id number] Date of Birth: 1945/02/27 Referring Provider:   Flowsheet Row Cardiac Rehab from 12/16/2019 in [facility] Cardiac and Pulmonary Rehab  Referring Provider [name] MD      Initial Encounter Date:  Flowsheet Row Cardiac Rehab from 12/16/2019 in [facility] Cardiac and Pulmonary Rehab  Date 12/16/19      Visit Diagnosis: NSTEMI (non-ST elevated myocardial infarction) [facility])  Patient's Home Medications on Admission:  Current Outpatient Medications:  .  acetaminophen (TYLENOL) 325 MG tablet, Take 650 mg by mouth every 6 (six) hours as needed. , Disp: , Rfl:  .  albuterol (VENTOLIN HFA) 108 (90 Base) MCG/ACT inhaler, Inhale 2 puffs into the lungs every 6 (six) hours as needed for wheezing or shortness of breath., Disp: 6.7 g, Rfl: 2 .  aspirin EC 81 MG EC tablet, Take 1 tablet (81 mg total) by mouth daily., Disp: , Rfl:  .  atorvastatin (LIPITOR) 80 MG tablet, Take 1 tablet (80 mg total) by mouth daily., Disp: 90 tablet, Rfl: 1 .  clopidogrel (PLAVIX) 75 MG tablet, Take 1 tablet (75 mg total) by mouth daily with breakfast., Disp: 30 tablet, Rfl: 11 .  gabapentin (NEURONTIN) 100 MG capsule, Take 1 capsule (100 mg total) by mouth daily., Disp: 90 capsule, Rfl: 1 .  metoprolol succinate (TOPROL-XL) 25 MG 24 hr tablet, Take 0.5 tablets (12.5 mg total) by mouth daily., Disp: 30 tablet, Rfl: 11 .  nitroGLYCERIN (NITROSTAT) 0.4 MG SL tablet, Place 1 tablet (0.4 mg total) under the tongue every 5 (five) minutes as needed for chest pain., Disp: 30 tablet, Rfl: 3  Past Medical History: Past Medical History:  Diagnosis Date  . Aortic calcification (HCC) 07/2015   Noted on chest CT with contrast  . Cataract   . Centrilobular emphysema ([place]) 08/03/2015   Noted on chest CT July 2017  . 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[place]) 07/29/2015  . Coronary artery calcification seen on CAT scan 07/29/2015   Noted on CT scan July 2017  .  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 Guillain Barr syndrome ([place])   . History of colonic polyps   . Hyperlipidemia   . Hypochloremia   . Osteopenia 08/29/2016   August 2018; next scan August 2020  . Right Supraclavicular Lymph node 08/03/2015  . Solitary pulmonary nodule 08/03/2015   CT Chest 07/2015 - Subpleural 3 mm left upper lobe pulmonary nodule, probably benign.  . Tobacco abuse     Tobacco Use: Social History   Tobacco Use  Smoking Status Current Every Day Smoker  . Packs/day: 0.15  . Years: 56.00  . Pack years: 8.40  . Types: Cigarettes  . Start date: 1964  Smokeless Tobacco Never Used  Tobacco Comment   New quit date 02/24/20 - made 02/05/20    Labs: Recent Review Flowsheet Data    Labs for ITP Cardiac and Pulmonary Rehab Latest Ref Rng & Units 03/27/2016 05/24/2017 10/28/2018 10/29/2019 12/01/2019   Cholestrol 0 - 200 mg/dL 162 164 187 140 111   LDLCALC 0 - 99 mg/dL 87 94 109(H) 61 57   HDL >40 mg/dL 48 51 59 65 49   Trlycerides <150 mg/dL 134 95 97 59 26   Hemoglobin A1c 4.2 - 6.3 % - - - - -       Exercise Target Goals: Exercise Program Goal: Individual exercise prescription set using results from initial 6 min walk test and  THRR while considering  patient's activity barriers and safety.   Exercise Prescription Goal: Initial exercise prescription builds to 30-45 minutes a day of aerobic activity, 2-3 days per week.  Home exercise guidelines will be given to patient during program as part of exercise prescription that the participant will acknowledge.   Education: Aerobic Exercise: - Group verbal and visual presentation on the components of exercise prescription. Introduces F.I.T.T principle from ACSM for exercise prescriptions.  Reviews F.I.T.T. principles of aerobic exercise including progression. Written material given at graduation. Flowsheet Row Cardiac Rehab from 04/01/2020 in [facility] Cardiac and Pulmonary Rehab  Education need  identified 12/16/19  Date 01/08/20  Educator AS  Instruction Review Code 1- Verbalizes Understanding      Education: Resistance Exercise: - Group verbal and visual presentation on the components of exercise prescription. Introduces F.I.T.T principle from ACSM for exercise prescriptions  Reviews F.I.T.T. principles of resistance exercise including progression. Written material given at graduation. Flowsheet Row Cardiac Rehab from 04/01/2020 in [facility] Cardiac and Pulmonary Rehab  Education need identified 12/16/19  Date 03/04/20  Educator [facility]  Instruction Review Code 1- Verbalizes Understanding       Education: Exercise & Equipment Safety: - Individual verbal instruction and demonstration of equipment use and safety with use of the equipment. Flowsheet Row Cardiac Rehab from 04/01/2020 in [facility] Cardiac and Pulmonary Rehab  Date 12/16/19  Educator [facility]  Instruction Review Code 1- Verbalizes Understanding      Education: Exercise Physiology & General Exercise Guidelines: - Group verbal and written instruction with models to review the exercise physiology of the cardiovascular system and associated critical values. Provides general exercise guidelines with specific guidelines to those with heart or lung disease.  Flowsheet Row Cardiac Rehab from 04/01/2020 in [facility] Cardiac and Pulmonary Rehab  Date 02/19/20  Educator [facility]  Instruction Review Code 1- Verbalizes Understanding      Education: Flexibility, Balance, Mind/Body Relaxation: - Group verbal and visual presentation with interactive activity on the components of exercise prescription. Introduces F.I.T.T principle from ACSM for exercise prescriptions. Reviews F.I.T.T. principles of flexibility and balance exercise training including progression. Also discusses the mind body connection.  Reviews various relaxation techniques to help reduce and manage stress (i.e. Deep breathing, progressive muscle relaxation, and visualization). Balance handout  provided to take home. Written material given at graduation. Flowsheet Row Cardiac Rehab from 04/01/2020 in [facility] Cardiac and Pulmonary Rehab  Date 03/11/20  Educator AS  Instruction Review Code 1- Verbalizes Understanding      Activity Barriers & Risk Stratification:  Activity Barriers & Cardiac Risk Stratification - 12/16/19 1300      Activity Barriers & Cardiac Risk Stratification   Activity Barriers Back Problems;Deconditioning;Muscular Weakness;Shortness of Breath;Balance Concerns    Cardiac Risk Stratification High           6 Minute Walk:  6 Minute Walk    Row Name 12/16/19 1300 03/25/20 0835       6 Minute Walk   Phase Initial Discharge    Distance 1120 feet 1262 feet    Distance % Change -- 12.7 %    Distance Feet Change -- 142 ft    Walk Time 6 minutes 6 minutes    # of Rest Breaks 0 0    MPH 2.12 2.4    METS 3.29 3.37    RPE 12 15    Perceived Dyspnea  1 1    VO2 Peak 11.5 11.8    Symptoms Yes (comment) No    Comments  SOB --    Resting HR 89 bpm 101 bpm    Resting BP 150/74 108/54    Resting Oxygen Saturation  97 % --    Exercise Oxygen Saturation  during 6 min walk 98 % --    Max Ex. HR 105 bpm 129 bpm    Max Ex. BP 154/62 112/54    2 Minute Post BP 132/74 --           Oxygen Initial Assessment:   Oxygen Re-Evaluation:   Oxygen Discharge (Final Oxygen Re-Evaluation):   Initial Exercise Prescription:  Initial Exercise Prescription - 12/16/19 1300      Date of Initial Exercise RX and Referring Provider   Date 12/16/19    Referring Provider [name] MD      Treadmill   MPH 2    Grade 0.5    Minutes 15    METs 2.67      Recumbant Bike   Level 2    RPM 50    Watts 22    Minutes 15    METs 2.5      NuStep   Level 2    SPM 80    Minutes 15    METs 2.5      Recumbant Elliptical   Level 1    RPM 50    Minutes 15    METs 2.5      Prescription Details   Frequency (times per week) 2    Duration Progress to 30 minutes of  continuous aerobic without signs/symptoms of physical distress      Intensity   THRR 40-80% of Max Heartrate 112-135    Ratings of Perceived Exertion 11-13    Perceived Dyspnea 0-4      Progression   Progression Continue to progress workloads to maintain intensity without signs/symptoms of physical distress.      Resistance Training   Training Prescription Yes    Weight 3 lb    Reps 10-15           Perform Capillary Blood Glucose checks as needed.  Exercise Prescription Changes:   Exercise Prescription Changes    Row Name 12/16/19 1300 12/29/19 1600 01/12/20 1200 01/26/20 1100 02/11/20 1400     Response to Exercise   Blood Pressure (Admit) 150/74 98/52 112/60 120/58 126/60   Blood Pressure (Exercise) 154/62 112/56 118/54 120/60 150/64   Blood Pressure (Exit) 132/74 96/56 102/58 112/58 98/56   Heart Rate (Admit) 89 bpm 93 bpm 109 bpm 99 bpm 99 bpm   Heart Rate (Exercise) 105 bpm 102 bpm 127 bpm 127 bpm 118 bpm   Heart Rate (Exit) 90 bpm 88 bpm 103 bpm 105 bpm 95 bpm   Oxygen Saturation (Admit) 97 % -- -- -- --   Oxygen Saturation (Exercise) 98 % -- -- -- --   Rating of Perceived Exertion (Exercise) 12 10 13 14 13    Perceived Dyspnea (Exercise) 1 -- -- -- --   Symptoms SOB none none none none   Comments walk test results first full day of exercise -- -- --   Duration -- Progress to 30 minutes of  aerobic without signs/symptoms of physical distress Continue with 30 min of aerobic exercise without signs/symptoms of physical distress. Continue with 30 min of aerobic exercise without signs/symptoms of physical distress. Continue with 30 min of aerobic exercise without signs/symptoms of physical distress.   Intensity -- THRR unchanged THRR unchanged THRR unchanged THRR unchanged     Progression  Progression -- Continue to progress workloads to maintain intensity without signs/symptoms of physical distress. Continue to progress workloads to maintain intensity without  signs/symptoms of physical distress. Continue to progress workloads to maintain intensity without signs/symptoms of physical distress. Continue to progress workloads to maintain intensity without signs/symptoms of physical distress.   Average METs -- 2.9 2.2 2.56 2.35     Resistance Training   Training Prescription -- Yes Yes Yes Yes   Weight -- 3 lb 3 lb 5 lb 5 lb   Reps -- 10-15 10-15 10-15 10-15     Interval Training   Interval Training -- No No No No     Treadmill   MPH -- -- 1.7 1.7 2   Grade -- -- 1 1 0.5   Minutes -- -- 15 15 15    METs -- -- 2.54 2.54 2.81     Recumbant Bike   Level -- 2 -- 2 --   Minutes -- 15 -- 15 --     NuStep   Level -- 2 -- 4 --   Minutes -- 15 -- 15 --   METs -- 2.9 -- 3 --     Recumbant Elliptical   Level -- -- 1 1 1.5   RPM -- -- 50 -- 50   Minutes -- -- 15 15 15    METs -- -- 1.8 2.1 1.9   Row Name 02/25/20 1300 03/08/20 1200 03/24/20 1100 04/06/20 1400       Response to Exercise   Blood Pressure (Admit) 118/64 116/54 114/58 120/62    Blood Pressure (Exercise) 120/62 120/62 130/60 126/76    Blood Pressure (Exit) 104/58 112/58 112/58 110/60    Heart Rate (Admit) 96 bpm 71 bpm 92 bpm 87 bpm    Heart Rate (Exercise) 113 bpm 119 bpm 124 bpm 119 bpm    Heart Rate (Exit) 86 bpm 81 bpm 91 bpm 95 bpm    Rating of Perceived Exertion (Exercise) 13 13 13 15     Symptoms none none none none    Duration Continue with 30 min of aerobic exercise without signs/symptoms of physical distress. Continue with 30 min of aerobic exercise without signs/symptoms of physical distress. Continue with 30 min of aerobic exercise without signs/symptoms of physical distress. Continue with 30 min of aerobic exercise without signs/symptoms of physical distress.    Intensity THRR unchanged THRR unchanged THRR unchanged --         Progression   Progression Continue to progress workloads to maintain intensity without signs/symptoms of physical distress. Continue to progress  workloads to maintain intensity without signs/symptoms of physical distress. Continue to progress workloads to maintain intensity without signs/symptoms of physical distress. Continue to progress workloads to maintain intensity without signs/symptoms of physical distress.    Average METs 2.68 2.48 2.7 2.7         Resistance Training   Training Prescription Yes Yes Yes Yes    Weight 5 lb 5 lb 5 lb 5 lb    Reps 10-15 10-15 10-15 10-15         Interval Training   Interval Training No No No No         Treadmill   MPH 2 2 2 2     Grade 1.5 1.5 2 2.5    Minutes 15 15 15 15     METs 2.94 2.94 3.08 3.22         Recumbant Bike   Level 1.5 -- 2.5 --    Minutes 15 --  15 --    METs -- -- 3 --         NuStep   Level 3 -- 4 --    Minutes 15 -- 15 --    METs 3 -- 2.5 --         Recumbant Elliptical   Level 1.5 1.7 1.5 2    Minutes 15 15 15  --    METs 2.1 2 2.2 2.2         Home Exercise Plan   Plans to continue exercise at Home (comment)  walking -- Home (comment)  walking Home (comment)  walking    Frequency Add 2 additional days to program exercise sessions. -- Add 2 additional days to program exercise sessions. Add 2 additional days to program exercise sessions.    Initial Home Exercises Provided 01/13/20 -- 01/13/20 01/13/20           Exercise Comments:   Exercise Comments    Row Name 12/23/19 [phone]           Exercise Comments First full day of exercise!  Patient was oriented to gym and equipment including functions, settings, policies, and procedures.  Patient's individual exercise prescription and treatment plan were reviewed.  All starting workloads were established based on the results of the 6 minute walk test done at initial orientation visit.  The plan for exercise progression was also introduced and progression will be customized based on patient's performance and goals.              Exercise Goals and Review:   Exercise Goals    Row Name 12/16/19 1405              Exercise Goals   Increase Physical Activity Yes       Intervention Provide advice, education, support and counseling about physical activity/exercise needs.;Develop an individualized exercise prescription for aerobic and resistive training based on initial evaluation findings, risk stratification, comorbidities and participant's personal goals.       Expected Outcomes Short Term: Attend rehab on a regular basis to increase amount of physical activity.;Long Term: Add in home exercise to make exercise part of routine and to increase amount of physical activity.;Long Term: Exercising regularly at least 3-5 days a week.       Increase Strength and Stamina Yes       Intervention Provide advice, education, support and counseling about physical activity/exercise needs.;Develop an individualized exercise prescription for aerobic and resistive training based on initial evaluation findings, risk stratification, comorbidities and participant's personal goals.       Expected Outcomes Short Term: Increase workloads from initial exercise prescription for resistance, speed, and METs.;Short Term: Perform resistance training exercises routinely during rehab and add in resistance training at home;Long Term: Improve cardiorespiratory fitness, muscular endurance and strength as measured by increased METs and functional capacity (6MWT)       Able to understand and use rate of perceived exertion (RPE) scale Yes       Intervention Provide education and explanation on how to use RPE scale       Expected Outcomes Short Term: Able to use RPE daily in rehab to express subjective intensity level;Long Term:  Able to use RPE to guide intensity level when exercising independently       Able to understand and use Dyspnea scale Yes       Intervention Provide education and explanation on how to use Dyspnea scale       Expected Outcomes Short Term:  Able to use Dyspnea scale daily in rehab to express subjective sense of  shortness of breath during exertion;Long Term: Able to use Dyspnea scale to guide intensity level when exercising independently       Knowledge and understanding of Target Heart Rate Range (THRR) Yes       Intervention Provide education and explanation of THRR including how the numbers were predicted and where they are located for reference       Expected Outcomes Short Term: Able to state/look up THRR;Short Term: Able to use daily as guideline for intensity in rehab;Long Term: Able to use THRR to govern intensity when exercising independently       Able to check pulse independently Yes       Intervention Provide education and demonstration on how to check pulse in carotid and radial arteries.;Review the importance of being able to check your own pulse for safety during independent exercise       Expected Outcomes Short Term: Able to explain why pulse checking is important during independent exercise;Long Term: Able to check pulse independently and accurately       Understanding of Exercise Prescription Yes       Intervention Provide education, explanation, and written materials on patient's individual exercise prescription       Expected Outcomes Short Term: Able to explain program exercise prescription;Long Term: Able to explain home exercise prescription to exercise independently              Exercise Goals Re-Evaluation :  Exercise Goals Re-Evaluation    Row Name 12/23/19 0843 12/29/19 1646 01/12/20 1223 01/13/20 0831 01/26/20 1104     Exercise Goal Re-Evaluation   Exercise Goals Review Able to understand and use rate of perceived exertion (RPE) scale;Knowledge and understanding of Target Heart Rate Range (THRR);Understanding of Exercise Prescription;Able to understand and use Dyspnea scale Increase Physical Activity;Increase Strength and Stamina;Understanding of Exercise Prescription Increase Physical Activity;Increase Strength and Stamina Increase Physical Activity;Increase Strength and  Stamina Increase Physical Activity;Increase Strength and Stamina;Understanding of Exercise Prescription   Comments Reviewed RPE and dyspnea scales, THR and program prescription with pt today.  Pt voiced understanding and was given a copy of goals to take home. Brent [name] has completed his first full day of exercise. He did well and we will continue to monitor his progress. Brent [name] continues to do well with exercise.  He attends consistently and works at [employer].  Staff will encourage working in [facility] range. Reviewed home exercise with pt today.  Pt plans to walk and use weights for exercise.  Reviewed THR, pulse, RPE, sign and symptoms, pulse oximetery and when to call 911 or MD.  Also discussed weather considerations and indoor options.  Pt voiced understanding. Brent [name] is doing well in rehab.  He is now up to level 4 on the NuStep.  We will continue to monitor his progress.   Expected Outcomes Short: Use RPE daily to regulate intensity. Long: Follow program prescription in THR. Short: Attend rehab regularly Long: Continue to follow program prescription Short: reach THR range during exercise Long:  improve overall stamina Short: Monitor vitals while exercising Long: Exercise independently at home Short: Add speed back to treadmill Long; Continue to improve stamina   Row Name 02/05/20 0759 02/11/20 1421 02/11/20 1423 02/25/20 1353 03/08/20 1226     Exercise Goal Re-Evaluation   Exercise Goals Review Increase Physical Activity;Increase Strength and Stamina;Understanding of Exercise Prescription Increase Physical Activity;Increase Strength and Stamina -- Increase Physical Activity;Increase Strength and Stamina;Understanding  of Exercise Prescription Increase Physical Activity;Increase Strength and Stamina   Comments Brent [name] reports starting to walk at home and doing weights. He walks at home everyday at home if he can- he walks for 20 minutes (RPE 13). He has weights at home 3lbs - most days of the week; 15 minutes. --  Brent [name] has increased speed on TM.  He attends consistently and works at lower end of THR range.  Staff will monitor progress. Brent [name] is doing well in rehab.  He is up level 1.5 on the recumbent ellipitcal.  We will continue to monitor his progress. Brent [name] continues to progress well.  He is up to level 1.7 on REL and up to 5 lb for weights.   Expected Outcomes Short: Continue to exercise at home, add warm up and cool down Long; Continue to improve stamina -- Short:  continue exercise at home 1-2 days outside program sessions Long:  build overall strength and stamina Short: Start to increase workloads again.  Long: Continue to improve stamina. Short:  continue to attend consistently Long:  improve overall MET level   Row Name 03/11/20 0744 03/24/20 1111 04/06/20 1448         Exercise Goal Re-Evaluation   Exercise Goals Review Increase Physical Activity;Increase Strength and Stamina;Understanding of Exercise Prescription Increase Physical Activity;Increase Strength and Stamina;Understanding of Exercise Prescription Increase Physical Activity;Increase Strength and Stamina     Comments Brent [name] is doing well in rehab.  He feels like he is getting stronger and has more stamina.  He is walking and doing some weights on his off days.   He is feeling pretty good overall. Brent [name] has been doing well in rehab.  He is up to 2.2 METs on the recumbent elliptical and using 5 lb weights.  We will continue to monitor his progress. Brent [name] will graduate next session.  He improved by 12.7% on his post walk test!     Expected Outcomes Short: Continue to exercise on off days.  Long: Continue to improve stamina. Short: Increase recumbent bike to level 3.  Long: Continue to improve stamina Short: graduate HT long: maintain exercise on his own            Discharge Exercise Prescription (Final Exercise Prescription Changes):  Exercise Prescription Changes - 04/06/20 1400      Response to Exercise   Blood Pressure (Admit)  120/62    Blood Pressure (Exercise) 126/76    Blood Pressure (Exit) 110/60    Heart Rate (Admit) 87 bpm    Heart Rate (Exercise) 119 bpm    Heart Rate (Exit) 95 bpm    Rating of Perceived Exertion (Exercise) 15    Symptoms none    Duration Continue with 30 min of aerobic exercise without signs/symptoms of physical distress.      Progression   Progression Continue to progress workloads to maintain intensity without signs/symptoms of physical distress.    Average METs 2.7      Resistance Training   Training Prescription Yes    Weight 5 lb    Reps 10-15      Interval Training   Interval Training No      Treadmill   MPH 2    Grade 2.5    Minutes 15    METs 3.22      Recumbant Elliptical   Level 2    METs 2.2      Home Exercise Plan   Plans to continue exercise at Home (comment)   walking  Frequency Add 2 additional days to program exercise sessions.    Initial Home Exercises Provided 01/13/20           Nutrition:  Target Goals: Understanding of nutrition guidelines, daily intake of sodium <1537m, cholesterol <20105m calories 30% from fat and 7% or less from saturated fats, daily to have 5 or more servings of fruits and vegetables.  Education: All About Nutrition: -Group instruction provided by verbal, written material, interactive activities, discussions, models, and posters to present general guidelines for heart healthy nutrition including fat, fiber, MyPlate, the role of sodium in heart healthy nutrition, utilization of the nutrition label, and utilization of this knowledge for meal planning. Follow up email sent as well. Written material given at graduation. Flowsheet [facility]  Date 03/18/20  [facility] Review Code 1- Verbalizes Understanding      Biometrics:  Pre Biometrics - 12/16/19 1406      Pre Biometrics   Height 5' 8.9" (1.75 m)    Weight 119 lb (54 kg)    BMI (Calculated)  17.63    Single Leg Stand 8.7 seconds            Nutrition Therapy Plan and Nutrition Goals:  Nutrition Therapy & Goals - 01/12/20 0833      Nutrition Therapy   Diet High kcal/protein    Drug/Food Interactions Statins/Certain Fruits    Protein (specify units) 65g    Fiber 30 grams    Whole Grain Foods 3 servings    Saturated Fats 12 max. grams    Fruits and Vegetables 5 servings/day    Sodium 1.5 grams      Personal Nutrition Goals   Nutrition Goal ST: add peanut butter to breakfast, add another snack or boost/ensure, eat protein foods first, add fat to meals already eating LT: meet nutritional needs    Comments ArIramould like to improve his strength and his appetite - lower than his baseline: he rarely gets hungry. B: 11am: oatmeal (sugar, raisins or banana, and whole milk), sausage  D: 6pm: chicken, pork chops, greens, potatoes, red meat 3x/week.  S: peanut butter crackers, potato chips Drinks: coffee - cream and sugar, gatoraide, and water. He eats a couple of tablespoons of food at each of your meals, he was eating a plateful of food at his meals. He reports in the last couple of months his appetite changed - he does not know what caused it, but he believes stress has something to do with it. He reports his weight is now stable (2-3 lbs up and down). Discussed high kcal/high protein nutrition therapy and briefly reviewed heart healthy eating - will focus more when his intake is adequate. RD in November reported he had severe malnutrition.      Intervention Plan   Intervention Prescribe, educate and counsel regarding individualized specific dietary modifications aiming towards targeted core components such as weight, hypertension, lipid management, diabetes, heart failure and other comorbidities.;Nutrition handout(s) given to patient.    Expected Outcomes Short Term Goal: Understand basic principles of dietary content, such as calories, fat, sodium, cholesterol and nutrients.;Short  Term Goal: A plan has been developed with personal nutrition goals set during dietitian appointment.;Long Term Goal: Adherence to prescribed nutrition plan.           Nutrition Assessments:  MEDIFICTS Score Key:  ?70 Need to make dietary changes   40-70 Heart Healthy Diet  ? 40 Therapeutic Level Cholesterol Diet  Flowsheet Row Cardiac Rehab from 04/08/2020 in [facility] Cardiac and Pulmonary Rehab  Picture Your Plate Total Score on Admission 45  Picture Your Plate Total Score on Discharge 36     Picture Your Plate Scores:  <64 Unhealthy dietary pattern with much room for improvement.  41-50 Dietary pattern unlikely to meet recommendations for good health and room for improvement.  51-60 More healthful dietary pattern, with some room for improvement.   >60 Healthy dietary pattern, although there may be some specific behaviors that could be improved.    Nutrition Goals Re-Evaluation:  Nutrition Goals Re-Evaluation    Row Name 02/05/20 0808 03/11/20 0750           Goals   Nutrition Goal ST:  Add fat to meals he already eats (he doesn't eat much butter, but like olive oil which is more heart healthy), add another ensure. LTmeet nutritional needs. ST:  Add fat to meals he already eats (he doesn't eat much butter, but like olive oil which is more heart healthy), add another ensure. LTmeet nutritional needs.      Comment He has added peanut butter to his oatmeal, he is eating his protein foods first. He has not added the ensure, he also hasn't added fat to his meals. Reviewed fats he could add (healhty vs less healthy). Brent [name] is doing well with his diet.  He has days where his appetite is not as good.  He admits to slacking on some of his peanut butter recently.  He will get back to it.  He is doing better with his protein overall.      Expected Outcome ST:  Add fat to meals he already eats (he doesn't eat much butter, but like olive oil which is more heart healthy), add another ensure.  LTmeet nutritional needs. Short: Get back to peanut butter again Long: Continue to eat healthier.             Nutrition Goals Discharge (Final Nutrition Goals Re-Evaluation):  Nutrition Goals Re-Evaluation - 03/11/20 0750      Goals   Nutrition Goal ST:  Add fat to meals he already eats (he doesn't eat much butter, but like olive oil which is more heart healthy), add another ensure. LTmeet nutritional needs.    Comment Brent [name] is doing well with his diet.  He has days where his appetite is not as good.  He admits to slacking on some of his peanut butter recently.  He will get back to it.  He is doing better with his protein overall.    Expected Outcome Short: Get back to peanut butter again Long: Continue to eat healthier.           Psychosocial: Target Goals: Acknowledge presence or absence of significant depression and/or stress, maximize coping skills, provide positive support system. Participant is able to verbalize types and ability to use techniques and skills needed for reducing stress and depression.   Education: Stress, Anxiety, and Depression - Group verbal and visual presentation to define topics covered.  Reviews how body is impacted by stress, anxiety, and depression.  Also discusses healthy ways to reduce stress and to treat/manage anxiety and depression.  Written material given at graduation.   Education: Sleep Hygiene -Provides group verbal and written instruction about how sleep can affect your health.  Define sleep hygiene, discuss sleep cycles and impact of sleep habits. Review good sleep hygiene tips.    Initial Review & Psychosocial Screening:  Initial Psych Review & Screening -  12/12/19 1413      Initial Review   Current issues with Current Stress Concerns    Comments Quit smoking      Family Dynamics   Good Support System? Yes      Barriers   Psychosocial barriers to participate in program There are no identifiable barriers or psychosocial needs.;The  patient should benefit from training in stress management and relaxation.      Screening Interventions   Interventions Encouraged to exercise;To provide support and resources with identified psychosocial needs;Provide feedback about the scores to participant    Expected Outcomes Short Term goal: Utilizing psychosocial counselor, staff and physician to assist with identification of specific Stressors or current issues interfering with healing process. Setting desired goal for each stressor or current issue identified.;Long Term Goal: Stressors or current issues are controlled or eliminated.;Short Term goal: Identification and review with participant of any Quality of Life or Depression concerns found by scoring the questionnaire.;Long Term goal: The participant improves quality of Life and PHQ9 Scores as seen by post scores and/or verbalization of changes           Quality of Life Scores:   Quality of Life - 04/08/20 0951      Quality of Life Scores   Health/Function Pre 20.4 %    Health/Function Post 23.87 %    Health/Function % Change 17.01 %    Socioeconomic Pre 19 %    Socioeconomic Post 24.69 %    Socioeconomic % Change  29.95 %    Psych/Spiritual Pre 23.14 %    Psych/Spiritual Post 30 %    Psych/Spiritual % Change 29.65 %    Family Pre 26.4 %    Family Post 21.2 %    Family % Change -19.7 %    GLOBAL Pre 21.64 %    GLOBAL Post 24.9 %    GLOBAL % Change 15.06 %          Scores of 19 and below usually indicate a poorer quality of life in these areas.  A difference of  2-3 points is a clinically meaningful difference.  A difference of 2-3 points in the total score of the Quality of Life Index has been associated with significant improvement in overall quality of life, self-image, physical symptoms, and general health in studies assessing change in quality of life.  PHQ-9: Recent Review Flowsheet Data    Depression screen [facility] 2/9 04/08/2020 01/29/2020 12/16/2019 10/29/2019  05/20/2019   Decreased Interest 2 0 0 2 0   Down, Depressed, Hopeless 2 0 0 2 0   PHQ - 2 Score 4 0 0 4 0   Altered sleeping 3 0 1 3 -   Tired, decreased energy 2 0 1 3 -   Change in appetite 2 0 0 3 -   Feeling bad or failure about yourself  3 0 1 3 -   Trouble concentrating 2 0 0 0 -   Moving slowly or fidgety/restless 2 0 1 2 -   Suicidal thoughts 1 0 1  2 -   PHQ-9 Score 19 0 5 20 -   Difficult doing work/chores Somewhat difficult Not difficult at all Somewhat difficult Somewhat difficult -     Interpretation of Total Score  Total Score Depression Severity:  1-4 = Minimal depression, 5-9 = Mild depression, 10-14 = Moderate depression, 15-19 = Moderately severe depression, 20-27 = Severe depression   Psychosocial Evaluation and Intervention:  Psychosocial Evaluation - 12/12/19 1413  Psychosocial Evaluation & Interventions   Comments Brent [name] reports doing well post NSTEMI. He did quit smoking when he was admitted on 11/21. He states it has been a struggle but he's making it work. He was made aware about access to resources to continue on this journey of quitting smoking. He wants to get "overall better health" and hopes to achieve that by coming to cardiac rehab.    Expected Outcomes Short: attend cardiac rehab for education and exercise. Long: develop positive self care habits.    Continue Psychosocial Services  Follow up required by staff           Psychosocial Re-Evaluation:  Psychosocial Re-Evaluation    Row Name 01/13/20 [phone] 01/29/20 0853 02/05/20 0801 03/11/20 0746       Psychosocial Re-Evaluation   Current issues with Current Stress Concerns Current Depression -- History of Depression;Current Stress Concerns    Comments Brent [name] is still working on quitting smoking.  He reports no signs of anxiety or depression.  He doesnt sleep well.  He has trouble staying asleep.  Staff recommended he talk to his Dr.  and use breathing, music or reading to help fall asleep. Brent [name]  is doing well in rehab.  His repeat PHQ is down to 0 now. He attributes it to getting out more and feeling better. He reports feeling ok - he is not on any medication. He reports this month has been bad for him - the snow (his generator gave out), his water heater gave out. He reports he has support in his wife. He manages his stress by singing and reading. He is trying to reduce his smoking, his quit date was 01/17/20, but the stress of this month hurt his progress; he is goign to try to quit 2/15.He reports that he gets frustrated being unable to lift certain things now. Brent [name] is doing well.  He denies any depression symptoms currently.  He is still reeling some from family.  He has had to move family into rest homes and all the paper work that goes along with it.  He feels that everyone depends on him.  He is still smoking some and down to 2-3 a day.  He uses it as a vice to cope.    Expected Outcomes Short: talk to Dr about sleep Long: develop better sleep patterns Short: Continue to get back to exercise Long: Continue feel better. Short: continue to use stress management habits, new smoking quit date 2/15, increase muscle to help with ADLs Long: Continue feel better overall Short: Continue to work towards quitting smoking Long: Continue to focus on positive.    Interventions -- Encouraged to attend Cardiac Rehabilitation for the exercise Encouraged to attend Cardiac Rehabilitation for the exercise Encouraged to attend Cardiac Rehabilitation for the exercise;Stress management education    Continue Psychosocial Services  -- Follow up required by staff Follow up required by staff Follow up required by staff    Comments -- -- House problems during the winter. --         Initial Review   Source of Stress Concerns -- -- Financial;Unable to perform yard/household activities --           Psychosocial Discharge (Final Psychosocial Re-Evaluation):  Psychosocial Re-Evaluation - 03/11/20 0746       Psychosocial Re-Evaluation   Current issues with History of Depression;Current Stress Concerns    Comments Brent [name] is doing well.  He denies any depression symptoms currently.  He is still reeling some from family.  He has had to move family into rest homes and all the paper work that goes along with it.  He feels that everyone depends on him.  He is still smoking some and down to 2-3 a day.  He uses it as a vice to cope.    Expected Outcomes Short: Continue to work towards quitting smoking Long: Continue to focus on positive.    Interventions Encouraged to attend Cardiac Rehabilitation for the exercise;Stress management education    Continue Psychosocial Services  Follow up required by staff           Vocational Rehabilitation: Provide vocational rehab assistance to qualifying candidates.   Vocational Rehab Evaluation & Intervention:  Vocational Rehab - 12/12/19 1413      Initial Vocational Rehab Evaluation & Intervention   Assessment shows need for Vocational Rehabilitation No           Education: Education Goals: Education classes will be provided on a variety of topics geared toward better understanding of heart health and risk factor modification. Participant will state understanding/return demonstration of topics presented as noted by education test scores.  Learning Barriers/Preferences:  Learning Barriers/Preferences - 12/12/19 1413      Learning Barriers/Preferences   Learning Barriers None    Learning Preferences None           General Cardiac Education Topics:  AED/CPR: - Group verbal and written instruction with the use of models to demonstrate the basic use of the AED with the basic ABC's of resuscitation.   Anatomy and Cardiac Procedures: - Group verbal and visual presentation and models provide information about basic cardiac anatomy and function. Reviews the testing methods done to diagnose heart disease and the outcomes of the test results. Describes the  treatment choices: Medical Management, Angioplasty, or Coronary Bypass Surgery for treating various heart conditions including Myocardial Infarction, Angina, Valve Disease, and Cardiac Arrhythmias.  Written material given at graduation. Flowsheet Row Cardiac Rehab from 04/01/2020 in [facility] Cardiac and Pulmonary Rehab  Date 03/04/20  Educator SB  Instruction Review Code 1- Verbalizes Understanding      Medication Safety: - Group verbal and visual instruction to review commonly prescribed medications for heart and lung disease. Reviews the medication, class of the drug, and side effects. Includes the steps to properly store meds and maintain the prescription regimen.  Written material given at graduation. Flowsheet Row Cardiac Rehab from 04/01/2020 in [facility] Cardiac and Pulmonary Rehab  Date 01/29/20  Educator SB  Instruction Review Code 1- Verbalizes Understanding      Intimacy: - Group verbal instruction through game format to discuss how heart and lung disease can affect sexual intimacy. Written material given at graduation.. Flowsheet Row Cardiac Rehab from 04/01/2020 in [facility] Cardiac and Pulmonary Rehab  Date 01/08/20  Educator as  Instruction Review Code 1- Verbalizes Understanding      Know Your Numbers and Heart Failure: - Group verbal and visual instruction to discuss disease risk factors for cardiac and pulmonary disease and treatment options.  Reviews associated critical values for Overweight/Obesity, Hypertension, Cholesterol, and Diabetes.  Discusses basics of heart failure: signs/symptoms and treatments.  Introduces Heart Failure Zone chart for action plan for heart failure.  Written material given at graduation. Flowsheet Row Cardiac Rehab from 04/01/2020 in [facility] Cardiac and Pulmonary Rehab  Date 04/01/20  Educator SB  Instruction Review Code 1- Verbalizes Understanding      Infection Prevention: - Provides verbal and written material to individual with discussion of infection  control including  proper hand washing and proper equipment cleaning during exercise session. Flowsheet Row Cardiac Rehab from 04/01/2020 in [facility] Cardiac and Pulmonary Rehab  Date 12/16/19  Educator [facility]  Instruction Review Code 1- Verbalizes Understanding      Falls Prevention: - Provides verbal and written material to individual with discussion of falls prevention and safety. Flowsheet Row Cardiac Rehab from 04/01/2020 in [facility] Cardiac and Pulmonary Rehab  Date 12/16/19  Educator [facility]  Instruction Review Code 1- Verbalizes Understanding      Other: -Provides group and verbal instruction on various topics (see comments)   Knowledge Questionnaire Score:  Knowledge Questionnaire Score - 04/08/20 0948      Knowledge Questionnaire Score   Pre Score 24/26 Education focus: exercise    Post Score 21/26           Core Components/Risk Factors/Patient Goals at Admission:  Personal Goals and Risk Factors at Admission - 12/16/19 1409      Core Components/Risk Factors/Patient Goals on Admission    Weight Management Yes;Weight Gain    Intervention Weight Management: Develop a combined nutrition and exercise program designed to reach desired caloric intake, while maintaining appropriate intake of nutrient and fiber, sodium and fats, and appropriate energy expenditure required for the weight goal.;Weight Management: Provide education and appropriate resources to help participant work on and attain dietary goals.    Admit Weight 119 lb (54 kg)    Goal Weight: Short Term 122 lb (55.3 kg)    Goal Weight: Long Term 125 lb (56.7 kg)    Expected Outcomes Short Term: Continue to assess and modify interventions until short term weight is achieved;Long Term: Adherence to nutrition and physical activity/exercise program aimed toward attainment of established weight goal;Weight Gain: Understanding of general recommendations for a high calorie, high protein meal plan that promotes weight gain by distributing  calorie intake throughout the day with the consumption for 4-5 meals, snacks, and/or supplements    Tobacco Cessation Yes    Intervention Assist the participant in steps to quit. Provide individualized education and counseling about committing to Tobacco Cessation, relapse prevention, and pharmacological support that can be provided by physician.;Advice worker, assist with locating and accessing local/national Quit Smoking programs, and support quit date choice.    Expected Outcomes Short Term: Will quit all tobacco product use, adhering to prevention of relapse plan.;Long Term: Complete abstinence from all tobacco products for at least 12 months from quit date.    Hypertension Yes    Intervention Provide education on lifestyle modifcations including regular physical activity/exercise, weight management, moderate sodium restriction and increased consumption of fresh fruit, vegetables, and low fat dairy, alcohol moderation, and smoking cessation.;Monitor prescription use compliance.    Expected Outcomes Long Term: Maintenance of blood pressure at goal levels.;Short Term: Continued assessment and intervention until BP is < 140/50m HG in hypertensive participants. < 130/823mHG in hypertensive participants with diabetes, heart failure or chronic kidney disease.    Lipids Yes    Intervention Provide education and support for participant on nutrition & aerobic/resistive exercise along with prescribed medications to achieve LDL <702mHDL >87m34m  Expected Outcomes Short Term: Participant states understanding of desired cholesterol values and is compliant with medications prescribed. Participant is following exercise prescription and nutrition guidelines.;Long Term: Cholesterol controlled with medications as prescribed, with individualized exercise RX and with personalized nutrition plan. Value goals: LDL < 70mg101mL > 40 mg.           Education:Diabetes - Individual verbal and  written  instruction to review signs/symptoms of diabetes, desired ranges of glucose level fasting, after meals and with exercise. Acknowledge that pre and post exercise glucose checks will be done for 3 sessions at entry of program.   Core Components/Risk Factors/Patient Goals Review:   Goals and Risk Factor Review    Row Name 01/13/20 0820 02/05/20 0813 03/11/20 0748         Core Components/Risk Factors/Patient Goals Review   Personal Goals Review Hypertension;Tobacco Cessation Hypertension;Tobacco Cessation Hypertension;Tobacco Cessation;Weight Management/Obesity     Review Carmino does plan to quit smoking.  He is down to about 3 cigarettes per day.  he does have Quit [employer].  He is trying to quit cold [place].  He says his wife is his "quit coach" at home.  he takes meds as directed most of the time - he missed yesterday due to power outage. He is trying to reduce his smoking (stil 3 cigarettes/day), his quit date was 01/17/20, but the stress of this month hurt his progress; he is goign to try to quit 2/15. He has the [employer]. He is still taking his medications as directed. Castulo is down 2-3 cigarettes a day. He is pleased with how far he has come.  He is not ready to set a date as this is his vice to cope with ongoing stressors.  He was commended for his progress and continues to work toward it.  His weight is holding steady and can vary some based on how his appetitie is doing.  His blood pressures are doing well and he continues to check them.     Expected Outcomes Short: set another quit date Long: quit smoking altogether Short: new quit dat 2/15 Long: quit smoking altogether Short: Continue to work towards quitting smoking Long: Continue to monitor risk factors.            Core Components/Risk Factors/Patient Goals at Discharge (Final Review):   Goals and Risk Factor Review - 03/11/20 0748      Core Components/Risk Factors/Patient Goals Review   Personal Goals Review  Hypertension;Tobacco Cessation;Weight Management/Obesity    Review Demba is down 2-3 cigarettes a day. He is pleased with how far he has come.  He is not ready to set a date as this is his vice to cope with ongoing stressors.  He was commended for his progress and continues to work toward it.  His weight is holding steady and can vary some based on how his appetitie is doing.  His blood pressures are doing well and he continues to check them.    Expected Outcomes Short: Continue to work towards quitting smoking Long: Continue to monitor risk factors.           ITP Comments:  ITP Comments    Row Name 12/12/19 1403 12/16/19 1149 12/23/19 0842 01/07/20 0731 01/12/20 0858   ITP Comments Initial orientation completed. Diagnosis can be found in [facility] 11/21. EP orientation scheduled for Tuesday, 12/7 at 9am. Completed 6MWT and gym orientation. Initial ITP created and sent for review to Dr. Emily Filbert, Medical Director. First full day of exercise!  Patient was oriented to gym and equipment including functions, settings, policies, and procedures.  Patient's individual exercise prescription and treatment plan were reviewed.  All starting workloads were established based on the results of the 6 minute walk test done at initial orientation visit.  The plan for exercise progression was also introduced and progression will be customized based on patient's performance and  goals. 30 Day review completed. Medical Director ITP review done, changes made as directed, and signed approval by Medical Director. Completed initial RD Evaluation   Row Name 02/04/20 1002 03/03/20 0719 03/31/20 1133 04/08/20 0754     ITP Comments 30 Day review completed. Medical Director ITP review done, changes made as directed, and signed approval by Medical Director. 30 Day review completed. Medical Director ITP review done, changes made as directed, and signed approval by Medical Director. 30 Day review completed. Medical Director ITP review  done, changes made as directed, and signed approval by Medical Director. Daunte graduated today from  rehab with 36 sessions completed.  Details of the patient's exercise prescription and what He needs to do in order to continue the prescription and progress were discussed with patient.  Patient was given a copy of prescription and goals.  Patient verbalized understanding.  Jaedan plans to continue to exercise by walking at home.           Comments: discharge ITP

## 2020-04-29 ENCOUNTER — Other Ambulatory Visit: Payer: Self-pay

## 2020-04-29 ENCOUNTER — Encounter: Payer: Self-pay | Admitting: Cardiovascular Disease

## 2020-04-29 ENCOUNTER — Ambulatory Visit: Payer: Medicare PPO | Admitting: Cardiovascular Disease

## 2020-04-29 VITALS — BP 110/60 | HR 86 | Ht 69.0 in | Wt 122.1 lb

## 2020-04-29 DIAGNOSIS — I2510 Atherosclerotic heart disease of native coronary artery without angina pectoris: Secondary | ICD-10-CM | POA: Diagnosis not present

## 2020-04-29 DIAGNOSIS — I472 Ventricular tachycardia: Secondary | ICD-10-CM | POA: Diagnosis not present

## 2020-04-29 DIAGNOSIS — I10 Essential (primary) hypertension: Secondary | ICD-10-CM | POA: Diagnosis not present

## 2020-04-29 DIAGNOSIS — Z720 Tobacco use: Secondary | ICD-10-CM | POA: Diagnosis not present

## 2020-04-29 DIAGNOSIS — M79604 Pain in right leg: Secondary | ICD-10-CM

## 2020-04-29 DIAGNOSIS — E785 Hyperlipidemia, unspecified: Secondary | ICD-10-CM

## 2020-04-29 DIAGNOSIS — I4729 Other ventricular tachycardia: Secondary | ICD-10-CM

## 2020-04-29 NOTE — Patient Instructions (Signed)
Medication Instructions:  Your physician recommends that you continue on your current medications as directed. Please refer to the Current Medication list given to you today.  *If you need a refill on your cardiac medications before your next appointment, please call your pharmacy*   Lab Work: None ordered If you have labs (blood work) drawn today and your tests are completely normal, you will receive your results only by: Marland Kitchen MyChart Message (if you have MyChart) OR . A paper copy in the mail If you have any lab test that is abnormal or we need to change your treatment, we will call you to review the results.   Testing/Procedures: Your physician has requested that you have a lower extremity arterial segmental doppler.  Follow-Up: At [facility], you and your health needs are our priority.  As part of our continuing mission to provide you with exceptional heart care, we have created designated Provider Care Teams.  These Care Teams include your primary Cardiologist (physician) and Advanced Practice Providers (APPs -  Physician Assistants and Nurse Practitioners) who all work together to provide you with the care you need, when you need it.  We recommend signing up for the patient portal called "MyChart".  Sign up information is provided on this After Visit Summary.  MyChart is used to connect with patients for Virtual Visits (Telemedicine).  Patients are able to view lab/test results, encounter notes, upcoming appointments, etc.  Non-urgent messages can be sent to your provider as well.   To learn more about what you can do with MyChart, go to [email or web address].    Your next appointment:   Your physician wants you to follow-up in: 6 months You will receive a reminder letter in the mail two months in advance. If you don't receive a letter, please call our office to schedule the follow-up appointment.   The format for your next appointment:   In Person  Provider:   You may see  [name], MD or one of the following Advanced Practice Providers on your designated Care Team:    Murray Hodgkins, NP  Christell Faith, PA-C  Marrianne Mood, PA-C  Cadence [place], [place]  Laurann Montana, NP    Other Instructions N/A

## 2020-04-29 NOTE — Progress Notes (Signed)
Cardiology Office Note   Date:  04/29/2020   ID:  DATRELL Brent [name], DOB January 28, 1945, MRN [id number]  PCP:  Brent Malkin, MD  Cardiologist:   Brent Sacramento, MD   Chief Complaint  Patient presents with  . 3 month follow up    "doing well." Medications reviewed by the patient verbally.       History of Present Illness: Brent [name] is a 75 y.o. male who presents for a follow-up visit regarding coronary artery disease. He has known history of essential hypertension, hyperlipidemia and Guillain-Barr syndrome. He was hospitalized in November 2021 with non-ST elevation myocardial infarction with peak troponin of 20,000.  Cardiac catheterization showed heavily calcified coronary arteries especially in the proximal segments.  The coronary arteries were left dominant with 90% stenosis in proximal RCA which was relatively small.  In addition, there was 70% stenosis in the mid left circumflex and 70% stenosis in first diagonal.  I recommended medical therapy.  Echocardiogram showed normal LV systolic function with an EF of 55 to 60%. He finished cardiac rehab in March.  He has been doing well with no chest pain, shortness of breath or palpitations.  He reports right leg pain and numbness with walking.  It can be unrelated to activities as well.  He thinks he had a previous stent in his right iliac artery although I could not find any details of that.  He did have an abdominal aortic ultrasound in November of last year which showed no evidence of aneurysm. He cut down on tobacco use and currently smokes few cigarettes a week.   Past Medical History:  Diagnosis Date  . Aortic calcification (HCC) 07/2015   Noted on chest CT with contrast  . Cataract   . Centrilobular emphysema ([place]) 08/03/2015   Noted on chest CT July 2017  . 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Brent [name]) 07/29/2015  . Coronary artery calcification seen on CAT scan 07/29/2015   Noted on CT scan July 2017  . Brent [name]  syndrome [facility]) July 2015   follow Prevnar vaccine  . Guillain Barr syndrome ([place])   . History of colonic polyps   . Hyperlipidemia   . Hypochloremia   . Osteopenia 08/29/2016   August 2018; next scan August 2020  . Right Supraclavicular Lymph node 08/03/2015  . Solitary pulmonary nodule 08/03/2015   CT Chest 07/2015 - Subpleural 3 mm left upper lobe pulmonary nodule, probably benign.  . Tobacco abuse     Past Surgical History:  Procedure Laterality Date  . COLONOSCOPY WITH PROPOFOL N/A 09/22/2014   Procedure: COLONOSCOPY WITH PROPOFOL;  Surgeon: Brent Lame, MD;  Location: [facility] ENDOSCOPY;  Service: Endoscopy;  Laterality: N/A;  . COLONOSCOPY WITH PROPOFOL N/A 07/09/2018   Procedure: COLONOSCOPY WITH PROPOFOL;  Surgeon: Brent Lame, MD;  Location: [facility] ENDOSCOPY;  Service: Endoscopy;  Laterality: N/A;  . DEEP NECK LYMPH NODE BIOPSY / EXCISION Right   . HERNIA REPAIR  1988  . LEFT HEART CATH AND CORONARY ANGIOGRAPHY N/A 12/01/2019   Procedure: LEFT HEART CATH AND CORONARY ANGIOGRAPHY;  Surgeon: Brent Hampshire, MD;  Location: [facility] LAB;  Service: Cardiovascular;  Laterality: N/A;  . LYMPH NODE BIOPSY    . NM MYOVIEW ([place])  10/09/2013   Dr. Lujean Amel: EF 58%. Normal wall motion. No ischemia or infarction.  . shoulder    . SHOULDER ARTHROSCOPY Right   . TRANSTHORACIC ECHOCARDIOGRAM  09/24/2013   EF 45-50% with mild global hypokinesis. GR 1 DD. No  valvular lesions besides mild TR. Normal RV pressures.     Current Outpatient Medications  Medication Sig Dispense Refill  . acetaminophen (TYLENOL) 325 MG tablet Take 650 mg by mouth every 6 (six) hours as needed.     Marland Kitchen albuterol (VENTOLIN HFA) 108 (90 Base) MCG/ACT inhaler Inhale 2 puffs into the lungs every 6 (six) hours as needed for wheezing or shortness of breath. 6.7 g 2  . aspirin EC 81 MG EC tablet Take 1 tablet (81 mg total) by mouth daily.    Marland Kitchen atorvastatin (LIPITOR) 80 MG tablet Take 1 tablet (80 mg total) by  mouth daily. 90 tablet 1  . clopidogrel (PLAVIX) 75 MG tablet Take 1 tablet (75 mg total) by mouth daily with breakfast. 30 tablet 11  . gabapentin (NEURONTIN) 100 MG capsule Take 1 capsule (100 mg total) by mouth daily. 90 capsule 1  . metoprolol succinate (TOPROL-XL) 25 MG 24 hr tablet Take 0.5 tablets (12.5 mg total) by mouth daily. 30 tablet 11  . nitroGLYCERIN (NITROSTAT) 0.4 MG SL tablet Place 1 tablet (0.4 mg total) under the tongue every 5 (five) minutes as needed for chest pain. 30 tablet 3   No current facility-administered medications for this visit.    Allergies:   Influenza vaccines, Pneumococcal vaccine, and Prevnar [pneumococcal 13-val conj vacc]    Social History:  The patient  reports that he has been smoking cigarettes. He started smoking about 58 years ago. He has a 8.40 pack-year smoking history. He has never used smokeless tobacco. He reports that he does not drink alcohol and does not use drugs.   Family History:  The patient's family history includes Alcohol abuse in his brother and mother; Cancer in his paternal grandfather; Diabetes in his sister; Healthy in his daughter, daughter, and son; Heart disease in his sister, sister and other family members; Parkinson's disease in an other family member; Stroke in an other family member.    ROS:  Please see the history of present illness.   Otherwise, review of systems are positive for none.   All other systems are reviewed and negative.    PHYSICAL EXAM: VS:  BP 110/60 (BP Location: Left Arm, Patient Position: Sitting, Cuff Size: Normal)   Pulse 86   Ht 5\' 9"  (1.753 m)   Wt 122 lb 2 oz (55.4 kg)   BMI 18.03 kg/m  , BMI Body mass index is 18.03 kg/m. GEN: Well nourished, well developed, in no acute distress  HEENT: normal  Neck: no JVD, carotid bruits, or masses Cardiac: RRR; no murmurs, rubs, or gallops,no edema  Respiratory:  clear to auscultation bilaterally, normal work of breathing GI: soft, nontender,  nondistended, + BS MS: no deformity or atrophy  Skin: warm and dry, no rash Neuro:  Strength and sensation are intact Psych: euthymic mood, full affect Vascular: Femoral pulses mildly diminished on the right side.  Distal pulses are diminished more so on the right than the left.   EKG:  EKG is ordered today. The ekg ordered today demonstrates normal sinus rhythm with left atrial enlargement and LVH.   Recent Labs: 10/29/2019: ALT 23 11/30/2019: TSH 1.766 12/02/2019: Magnesium 2.1 12/03/2019: BUN 13; Creatinine, Ser 0.83; Hemoglobin 12.6; Platelets 191; Potassium 3.8; Sodium 136    Lipid Panel    Component Value Date/Time   CHOL 111 12/01/2019 0405   CHOL 158 07/29/2015 1241   TRIG 26 12/01/2019 0405   HDL 49 12/01/2019 0405   HDL 47 07/29/2015 1241  CHOLHDL 2.3 12/01/2019 0405   VLDL 5 12/01/2019 0405   LDLCALC 57 12/01/2019 0405   LDLCALC 61 10/29/2019 0903      Wt Readings from Last 3 Encounters:  04/29/20 122 lb 2 oz (55.4 kg)  12/18/19 118 lb (53.5 kg)  12/16/19 119 lb (54 kg)        No flowsheet data found.    ASSESSMENT AND PLAN:  1.  Coronary artery disease involving native coronary arteries without angina: He is doing very well with no anginal symptoms.  Continue medical therapy.  2.  Essential hypertension: Blood pressure is well controlled.  3.  Hyperlipidemia: Continue high-dose atorvastatin.  Most recent lipid profile showed an LDL of 57  4.  Tobacco use: He cut down on tobacco use.  I discussed the importance of complete cessation.  5.  Nonsustained ventricular tachycardia in the setting of myocardial infarction: Continue Toprol.  No recurrent arrhythmia.  6.  Right leg pain: Could be atypical claudication.  Abnormal distal pulses.  Requested lower extremity arterial Doppler.    Disposition:   FU with me in 6 months  Signed,  Brent Sacramento, MD  04/29/2020 8:20 AM    [place]

## 2020-05-20 ENCOUNTER — Telehealth: Payer: Self-pay | Admitting: *Deleted

## 2020-05-20 ENCOUNTER — Ambulatory Visit (INDEPENDENT_AMBULATORY_CARE_PROVIDER_SITE_OTHER): Payer: Medicare PPO

## 2020-05-20 ENCOUNTER — Other Ambulatory Visit: Payer: Self-pay

## 2020-05-20 VITALS — BP 122/66 | HR 96 | Temp 98.0°F | Resp 15 | Ht 69.0 in | Wt 120.7 lb

## 2020-05-20 DIAGNOSIS — Z5941 Food insecurity: Secondary | ICD-10-CM | POA: Diagnosis not present

## 2020-05-20 DIAGNOSIS — Z599 Problem related to housing and economic circumstances, unspecified: Secondary | ICD-10-CM | POA: Diagnosis not present

## 2020-05-20 DIAGNOSIS — Z0000 Encounter for general adult medical examination without abnormal findings: Secondary | ICD-10-CM | POA: Diagnosis not present

## 2020-05-20 NOTE — Patient Instructions (Signed)
Brent [name] , Thank you for taking time to come for your Medicare Wellness Visit. I appreciate your ongoing commitment to your health goals. Please review the following plan we discussed and let me know if I can assist you in the future.   Screening recommendations/referrals: Colonoscopy: done 07/09/18. Repeat in 2025 Recommended yearly ophthalmology/optometry visit for glaucoma screening and checkup Recommended yearly dental visit for hygiene and checkup  Vaccinations: Influenza vaccine: n/a Pneumococcal vaccine: n/a Tdap vaccine: due Shingles vaccine: Shingrix discussed. Please contact your pharmacy for coverage information.  Covid-19:  declined  Advanced directives: Advance directive discussed with you today. I have provided a copy for you to complete at home and have notarized. Once this is complete please bring a copy in to our office so we can scan it into your chart.  Conditions/risks identified: Recommend increasing caloric intake and having 3 healthy meals per day  Next appointment: Follow up in one year for your annual wellness visit.   Preventive Care 54 Years and Older, Male Preventive care refers to lifestyle choices and visits with your health care provider that can promote health and wellness. What does preventive care include?  A yearly physical exam. This is also called an annual well check.  Dental exams once or twice a year.  Routine eye exams. Ask your health care provider how often you should have your eyes checked.  Personal lifestyle choices, including:  Daily care of your teeth and gums.  Regular physical activity.  Eating a healthy diet.  Avoiding tobacco and drug use.  Limiting alcohol use.  Practicing safe sex.  Taking low doses of aspirin every day.  Taking vitamin and mineral supplements as recommended by your health care provider. What happens during an annual well check? The services and screenings done by your health care provider  during your annual well check will depend on your age, overall health, lifestyle risk factors, and family history of disease. Counseling  Your health care provider may ask you questions about your:  Alcohol use.  Tobacco use.  Drug use.  Emotional well-being.  Home and relationship well-being.  Sexual activity.  Eating habits.  History of falls.  Memory and ability to understand (cognition).  Work and work Statistician. Screening  You may have the following tests or measurements:  Height, weight, and BMI.  Blood pressure.  Lipid and cholesterol levels. These may be checked every 5 years, or more frequently if you are over 55 years old.  Skin check.  Lung cancer screening. You may have this screening every year starting at age 73 if you have a 30-pack-year history of smoking and currently smoke or have quit within the past 15 years.  Fecal occult blood test (FOBT) of the stool. You may have this test every year starting at age 30.  Flexible sigmoidoscopy or colonoscopy. You may have a sigmoidoscopy every 5 years or a colonoscopy every 10 years starting at age 24.  Prostate cancer screening. Recommendations will vary depending on your family history and other risks.  Hepatitis C blood test.  Hepatitis B blood test.  Sexually transmitted disease (STD) testing.  Diabetes screening. This is done by checking your blood sugar (glucose) after you have not eaten for a while (fasting). You may have this done every 1-3 years.  Abdominal aortic aneurysm (AAA) screening. You may need this if you are a current or former smoker.  Osteoporosis. You may be screened starting at age 45 if you are at high risk. Talk with your  health care provider about your test results, treatment options, and if necessary, the need for more tests. Vaccines  Your health care provider may recommend certain vaccines, such as:  Influenza vaccine. This is recommended every year.  Tetanus,  diphtheria, and acellular pertussis (Tdap, Td) vaccine. You may need a Td booster every 10 years.  Zoster vaccine. You may need this after age 58.  Pneumococcal 13-valent conjugate (PCV13) vaccine. One dose is recommended after age 55.  Pneumococcal polysaccharide (PPSV23) vaccine. One dose is recommended after age 17. Talk to your health care provider about which screenings and vaccines you need and how often you need them. This information is not intended to replace advice given to you by your health care provider. Make sure you discuss any questions you have with your health care provider. Document Released: 01/22/2015 Document Revised: 09/15/2015 Document Reviewed: 10/27/2014 Elsevier Interactive Patient Education  2017 [place] Prevention in the Home Falls can cause injuries. They can happen to people of all ages. There are many things you can do to make your home safe and to help prevent falls. What can I do on the outside of my home?  Regularly fix the edges of walkways and driveways and fix any cracks.  Remove anything that might make you trip as you walk through a door, such as a raised step or threshold.  Trim any bushes or trees on the path to your home.  Use bright outdoor lighting.  Clear any walking paths of anything that might make someone trip, such as rocks or tools.  Regularly check to see if handrails are loose or broken. Make sure that both sides of any steps have handrails.  Any raised decks and porches should have guardrails on the edges.  Have any leaves, snow, or ice cleared regularly.  Use sand or salt on walking paths during winter.  Clean up any spills in your garage right away. This includes oil or grease spills. What can I do in the bathroom?  Use night lights.  Install grab bars by the toilet and in the tub and shower. Do not use towel bars as grab bars.  Use non-skid mats or decals in the tub or shower.  If you need to sit down in  the shower, use a plastic, non-slip stool.  Keep the floor dry. Clean up any water that spills on the floor as soon as it happens.  Remove soap buildup in the tub or shower regularly.  Attach bath mats securely with double-sided non-slip rug tape.  Do not have throw rugs and other things on the floor that can make you trip. What can I do in the bedroom?  Use night lights.  Make sure that you have a light by your bed that is easy to reach.  Do not use any sheets or blankets that are too big for your bed. They should not hang down onto the floor.  Have a firm chair that has side arms. You can use this for support while you get dressed.  Do not have throw rugs and other things on the floor that can make you trip. What can I do in the kitchen?  Clean up any spills right away.  Avoid walking on wet floors.  Keep items that you use a lot in easy-to-reach places.  If you need to reach something above you, use a strong step stool that has a grab bar.  Keep electrical cords out of the way.  Do not use  floor polish or wax that makes floors slippery. If you must use wax, use non-skid floor wax.  Do not have throw rugs and other things on the floor that can make you trip. What can I do with my stairs?  Do not leave any items on the stairs.  Make sure that there are handrails on both sides of the stairs and use them. Fix handrails that are broken or loose. Make sure that handrails are as long as the stairways.  Check any carpeting to make sure that it is firmly attached to the stairs. Fix any carpet that is loose or worn.  Avoid having throw rugs at the top or bottom of the stairs. If you do have throw rugs, attach them to the floor with carpet tape.  Make sure that you have a light switch at the top of the stairs and the bottom of the stairs. If you do not have them, ask someone to add them for you. What else can I do to help prevent falls?  Wear shoes that:  Do not have high  heels.  Have rubber bottoms.  Are comfortable and fit you well.  Are closed at the toe. Do not wear sandals.  If you use a stepladder:  Make sure that it is fully opened. Do not climb a closed stepladder.  Make sure that both sides of the stepladder are locked into place.  Ask someone to hold it for you, if possible.  Clearly mark and make sure that you can see:  Any grab bars or handrails.  First and last steps.  Where the edge of each step is.  Use tools that help you move around (mobility aids) if they are needed. These include:  Canes.  Walkers.  Scooters.  Crutches.  Turn on the lights when you go into a dark area. Replace any light bulbs as soon as they burn out.  Set up your furniture so you have a clear path. Avoid moving your furniture around.  If any of your floors are uneven, fix them.  If there are any pets around you, be aware of where they are.  Review your medicines with your doctor. Some medicines can make you feel dizzy. This can increase your chance of falling. Ask your doctor what other things that you can do to help prevent falls. This information is not intended to replace advice given to you by your health care provider. Make sure you discuss any questions you have with your health care provider. Document Released: 10/22/2008 Document Revised: 06/03/2015 Document Reviewed: 01/30/2014 Elsevier Interactive Patient Education  2017 [employer].

## 2020-05-20 NOTE — Progress Notes (Signed)
Subjective:   Brent [name] is a 75 y.o. male who presents for Medicare Annual/Subsequent preventive examination.  Review of Systems     Cardiac Risk Factors include: advanced age (>74men, >1 women);dyslipidemia;male gender;hypertension;smoking/ tobacco exposure     Objective:    Today's Vitals   05/20/20 0817  BP: 122/66  Pulse: 96  Resp: 15  Temp: 98 F (36.7 C)  TempSrc: Oral  SpO2: 99%  Weight: 120 lb 11.2 oz (54.7 kg)  Height: 5\' 9"  (1.753 m)   Body mass index is 17.82 kg/m.  Advanced Directives 05/20/2020 12/12/2019 11/30/2019 05/20/2019 07/09/2018 05/09/2018 05/10/2017  Does Patient Have a Medical Advance Directive? No No No No No No No  Would patient like information on creating a medical advance directive? Yes (MAU/Ambulatory/Procedural Areas - Information given) No - Patient declined No - Patient declined Yes (MAU/Ambulatory/Procedural Areas - Information given) - Yes (MAU/Ambulatory/Procedural Areas - Information given) Yes (MAU/Ambulatory/Procedural Areas - Information given)  Pre-existing out of facility DNR order (yellow form or pink MOST form) - - - - - - -    Current Medications (verified) Outpatient Encounter Medications as of 05/20/2020  Medication Sig  . acetaminophen (TYLENOL) 325 MG tablet Take 650 mg by mouth every 6 (six) hours as needed.   Marland Kitchen albuterol (VENTOLIN HFA) 108 (90 Base) MCG/ACT inhaler Inhale 2 puffs into the lungs every 6 (six) hours as needed for wheezing or shortness of breath.  Marland Kitchen aspirin EC 81 MG EC tablet Take 1 tablet (81 mg total) by mouth daily.  Marland Kitchen atorvastatin (LIPITOR) 80 MG tablet Take 1 tablet (80 mg total) by mouth daily.  Marland Kitchen gabapentin (NEURONTIN) 100 MG capsule Take 1 capsule (100 mg total) by mouth daily.  . metoprolol succinate (TOPROL-XL) 25 MG 24 hr tablet Take 0.5 tablets (12.5 mg total) by mouth daily.  . clopidogrel (PLAVIX) 75 MG tablet Take 1 tablet (75 mg total) by mouth daily with breakfast.  . nitroGLYCERIN  (NITROSTAT) 0.4 MG SL tablet Place 1 tablet (0.4 mg total) under the tongue every 5 (five) minutes as needed for chest pain.   No facility-administered encounter medications on file as of 05/20/2020.    Allergies (verified) Influenza vaccines, Pneumococcal vaccine, and Prevnar [pneumococcal 13-val conj vacc]   History: Past Medical History:  Diagnosis Date  . Aortic calcification (HCC) 07/2015   Noted on chest CT with contrast  . Cataract   . Centrilobular emphysema ([place]) 08/03/2015   Noted on chest CT July 2017  . 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[place]) 07/29/2015  . Coronary artery calcification seen on CAT scan 07/29/2015   Noted on CT scan July 2017  . 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 Guillain Barr syndrome ([place])   . History of colonic polyps   . Hyperlipidemia   . Hypochloremia   . Osteopenia 08/29/2016   August 2018; next scan August 2020  . Right Supraclavicular Lymph node 08/03/2015  . Solitary pulmonary nodule 08/03/2015   CT Chest 07/2015 - Subpleural 3 mm left upper lobe pulmonary nodule, probably benign.  . Tobacco abuse    Past Surgical History:  Procedure Laterality Date  . COLONOSCOPY WITH PROPOFOL N/A 09/22/2014   Procedure: COLONOSCOPY WITH PROPOFOL;  Surgeon: Lucilla Lame, MD;  Location: [facility] ENDOSCOPY;  Service: Endoscopy;  Laterality: N/A;  . COLONOSCOPY WITH PROPOFOL N/A 07/09/2018   Procedure: COLONOSCOPY WITH PROPOFOL;  Surgeon: Lucilla Lame, MD;  Location[facility] ENDOSCOPY;  Service: Endoscopy;  Laterality: N/A;  . DEEP  NECK LYMPH NODE BIOPSY / EXCISION Right   . HERNIA REPAIR  1988  . LEFT HEART CATH AND CORONARY ANGIOGRAPHY N/A 12/01/2019   Procedure: LEFT HEART CATH AND CORONARY ANGIOGRAPHY;  Surgeon: Iran Ouch, MD;  Location: [facility] INVASIVE CV LAB;  Service: Cardiovascular;  Laterality: N/A;  . LYMPH NODE BIOPSY    . NM MYOVIEW ([facility] HX)  10/09/2013   Dr. Dorothyann Peng: EF 58%. Normal wall motion. No ischemia or infarction.   . shoulder    . SHOULDER ARTHROSCOPY Right   . TRANSTHORACIC ECHOCARDIOGRAM  09/24/2013   EF 45-50% with mild global hypokinesis. GR 1 DD. No valvular lesions besides mild TR. Normal RV pressures.   Family History  Problem Relation Age of Onset  . Alcohol abuse Mother   . Healthy Daughter   . Parkinson's disease Other   . Stroke Other   . Diabetes Sister   . Heart disease Other   . Heart disease Other   . Alcohol abuse Brother   . Cancer Paternal Grandfather        pancreatic  . Heart disease Sister   . Heart disease Sister   . Healthy Son   . Healthy Daughter   . Bladder Cancer Neg Hx   . Kidney cancer Neg Hx    Social History   Socioeconomic History  . Marital status: Married    Spouse name: Meriam Sprague  . Number of children: 3  . Years of education: some college  . Highest education level: 12th grade  Occupational History  . Occupation: Retired  Tobacco Use  . Smoking status: Current Some Day Smoker    Packs/day: 0.15    Years: 56.00    Pack years: 8.40    Types: Cigarettes    Start date: 1964  . Smokeless tobacco: Never Used  . Tobacco comment: New quit date 02/24/20 - made 02/05/20; currently smoking 3 cigs per week 05/20/20  Vaping Use  . Vaping Use: Never used  Substance and Sexual Activity  . Alcohol use: No  . Drug use: No  . Sexual activity: Not Currently    Partners: Female  Other Topics Concern  . Not on file  Social History Narrative   Current tobacco user,3 cigarettes per week, previously 2 packs per day for 52 years, formerly worked as a Naval architect, has dogs at home.   Social Determinants of Health   Financial Resource Strain: Medium Risk  . Difficulty of Paying Living Expenses: Somewhat hard  Food Insecurity: Food Insecurity Present  . Worried About Programme researcher, broadcasting/film/video in the Last Year: Sometimes true  . Ran Out of Food in the Last Year: Never true  Transportation Needs: No Transportation Needs  . Lack of Transportation (Medical): No  .  Lack of Transportation (Non-Medical): No  Physical Activity: Sufficiently Active  . Days of Exercise per Week: 7 days  . Minutes of Exercise per Session: 30 min  Stress: No Stress Concern Present  . Feeling of Stress : Only a little  Social Connections: Moderately Integrated  . Frequency of Communication with Friends and Family: More than three times a week  . Frequency of Social Gatherings with Friends and Family: Three times a week  . Attends Religious Services: More than 4 times per year  . Active Member of Clubs or Organizations: No  . Attends Banker Meetings: Never  . Marital Status: Married    Tobacco Counseling Ready to quit: Not Answered Counseling given: Not Answered Comment: New  quit date 02/24/20 - made 02/05/20; currently smoking 3 cigs per week 05/20/20   Clinical Intake:  Pre-visit preparation completed: Yes  Pain : No/denies pain     BMI - recorded: 17.82 Nutritional Status: BMI <19  Underweight Nutritional Risks: None Diabetes: No  How often do you need to have someone help you when you read instructions, pamphlets, or other written materials from your doctor or pharmacy?: 1 - Never    Interpreter Needed?: No  Information entered by :: [name] LPN   Activities of Daily Living In your present state of health, do you have any difficulty performing the following activities: 05/20/2020 11/30/2019  Hearing? N N  Comment declines hearing aids -  Vision? N N  Difficulty concentrating or making decisions? N N  Walking or climbing stairs? N N  Dressing or bathing? N N  Doing errands, shopping? N N  Preparing Food and eating ? N -  Using the Toilet? N -  In the past six months, have you accidently leaked urine? N -  Do you have problems with loss of bowel control? N -  Managing your Medications? N -  Managing your Finances? N -  Housekeeping or managing your Housekeeping? N -  Some recent data might be hidden    Patient Care  Team: [place] as PCP - General (Family Medicine) Carloyn Manner, MD as Referring Physician (Otolaryngology) Wellington Hampshire, MD as Consulting Physician (Cardiology)  Indicate any recent Medical Services you may have received from other than [facility] providers in the past year (date may be approximate).     Assessment:   This is a routine wellness examination for [place].  Hearing/Vision screen  Hearing Screening   125Hz  250Hz  500Hz  1000Hz  2000Hz  3000Hz  4000Hz  6000Hz  8000Hz   Right ear:           Left ear:           Comments:  Pt denies hearing difficulty  Vision Screening Comments: Annual vision screenings with [facility]  Dietary issues and exercise activities discussed: Current Exercise Habits: Home exercise routine, Type of exercise: walking, Time (Minutes): 35, Frequency (Times/Week): 7, Weekly Exercise (Minutes/Week): 245, Intensity: Mild, Exercise limited by: respiratory conditions(s);neurologic condition(s)  Goals Addressed            This Visit's Progress   . DIET - INCREASE WATER INTAKE   On track    Recommend increasing water intake to 6-8 glasses per day       Depression Screen PHQ 2/9 Scores 05/20/2020 04/08/2020 01/29/2020 12/16/2019 10/29/2019 05/20/2019 01/28/2019  PHQ - 2 Score 2 4 0 0 4 0 2  PHQ- 9 Score 5 19 0 5 20 - 5    Fall Risk Fall Risk  05/20/2020 12/12/2019 10/29/2019 05/20/2019 01/28/2019  Falls in the past year? 0 0 0 0 0  Number falls in past yr: 0 - 0 0 0  Injury with Fall? 0 - 0 0 0  Risk for fall due to : No Fall Risks - - No Fall Risks -  Risk for fall due to: Comment - - - - -  Follow up Falls prevention discussed - - Falls prevention discussed -    FALL RISK PREVENTION PERTAINING TO THE HOME:  Any stairs in or around the home? Yes  If so, are there any without handrails? No  Home free of loose throw rugs in walkways, pet beds, electrical cords, etc? Yes  Adequate lighting in your home to reduce  risk of falls? Yes   ASSISTIVE DEVICES UTILIZED TO PREVENT FALLS:  Life alert? No  Use of a cane, walker or w/c? No  Grab bars in the bathroom? Yes  Shower chair or bench in shower? Yes  Elevated toilet seat or a handicapped toilet? No   TIMED UP AND GO:  Was the test performed? Yes .  Length of time to ambulate 10 feet: 5 sec.   Gait steady and fast without use of assistive device  Cognitive Function:     6CIT Screen 05/20/2020 05/20/2019 05/09/2018 05/10/2017  What Year? 0 points 0 points 0 points 0 points  What month? 0 points 0 points 0 points 0 points  What time? 0 points 0 points 0 points 3 points  Count back from 20 0 points 0 points 0 points 0 points  Months in reverse 2 points 0 points 0 points 0 points  Repeat phrase 0 points 2 points 0 points 0 points  Total Score 2 2 0 3    Immunizations Immunization History  Administered Date(s) Administered  . Pneumococcal Conjugate-13 07/25/2013  . Td 02/18/2003  . Tdap 01/09/2009    TDAP status: Due, Education has been provided regarding the importance of this vaccine. Advised may receive this vaccine at local pharmacy or Health Dept. Aware to provide a copy of the vaccination record if obtained from local pharmacy or Health Dept. Verbalized acceptance and understanding.  Flu vaccine status: n/a due to hx of Guillain Barre  Pneumococcal vaccine status: PCV13 done but had reaction.   Covid-19 vaccine status: Declined, Education has been provided regarding the importance of this vaccine but patient still declined. Advised may receive this vaccine at local pharmacy or Health Dept.or vaccine clinic. Aware to provide a copy of the vaccination record if obtained from local pharmacy or Health Dept. Verbalized acceptance and understanding.  Qualifies for Shingles Vaccine? Yes   Zostavax completed No   Shingrix Completed?: No.    Education has been provided regarding the importance of this vaccine. Patient has been advised to call  insurance company to determine out of pocket expense if they have not yet received this vaccine. Advised may also receive vaccine at local pharmacy or Health Dept. Verbalized acceptance and understanding.  Screening Tests Health Maintenance  Topic Date Due  . COVID-19 Vaccine (1) 06/05/2020 (Originally 09/12/1950)  . TETANUS/TDAP  01/09/2021 (Originally 01/10/2019)  . COLONOSCOPY (Pts 45-87yrs Insurance coverage will need to be confirmed)  07/09/2023  . Hepatitis C Screening  Completed  . HPV VACCINES  Aged Out  . INFLUENZA VACCINE  Discontinued  . PNA vac Low Risk Adult  Discontinued    Health Maintenance  There are no preventive care reminders to display for this patient.  Colorectal cancer screening: Type of screening: Colonoscopy. Completed 07/09/18. Repeat every 5 years  Lung Cancer Screening: (Low Dose CT Chest recommended if Age 57-80 years, 30 pack-year currently smoking OR have quit w/in 15years.) does qualify. Completed 11/07/19.   Additional Screening:  Hepatitis C Screening: does qualify; Completed 03/27/16  Vision Screening: Recommended annual ophthalmology exams for early detection of glaucoma and other disorders of the eye. Is the patient up to date with their annual eye exam?  Yes  Who is the provider or what is the name of the office in which the patient attends annual eye exams? [facility].   Dental Screening: Recommended annual dental exams for proper oral hygiene  Community Resource Referral / Chronic Care Management: CRR required this visit?  Yes   CCM required this visit?  No      Plan:     I have personally reviewed and noted the following in the patient's chart:   . Medical and social history . Use of alcohol, tobacco or illicit drugs  . Current medications and supplements including opioid prescriptions. Patient is not currently taking opioid prescriptions. . Functional ability and status . Nutritional status . Physical activity . Advanced  directives . List of other physicians . Hospitalizations, surgeries, and ER visits in previous 12 months . Vitals . Screenings to include cognitive, depression, and falls . Referrals and appointments  In addition, I have reviewed and discussed with patient certain preventive protocols, quality metrics, and best practice recommendations. A written personalized care plan for preventive services as well as general preventive health recommendations were provided to patient.     [name], LPN   07/20/1973   Nurse Notes: pt c/o intermittent numbness in right leg; taking gabapentin. Advised to schedule office visit for follow up.

## 2020-05-20 NOTE — Telephone Encounter (Signed)
   Telephone encounter was:  Successful.  05/20/2020 Name: Brent [name] MRN: [id number] DOB: 05/01/1945  [name] is a 75 y.o. year old male who is a primary care patient of [facility] . The community resource team was consulted for assistance with Food Insecurity and Financial Difficulties related to utilities  Care guide performed the following interventions: Patient provided with information about care guide support team and interviewed to confirm resource needs.  Follow Up Plan:  Care guide will follow up with patient by phone over the next days  [place], Care Management  [phone] [address] , [place] [address] Email : [email or web address]

## 2020-05-21 ENCOUNTER — Telehealth: Payer: Self-pay | Admitting: *Deleted

## 2020-05-24 ENCOUNTER — Telehealth: Payer: Self-pay | Admitting: *Deleted

## 2020-05-24 NOTE — Telephone Encounter (Signed)
   Telephone encounter was:  Unsuccessful.  05/24/2020 Name: Brent [name] MRN: [id number] DOB: 28-Jan-1945  Unsuccessful outbound call made today to assist with:  Transportation Needs  and Food Insecurity  Outreach Attempt:  2nd Attempt  No message left   [place], Care Management  [phone] [address] , [place] [address] Email : [email or web address] [email or web address]

## 2020-05-25 ENCOUNTER — Telehealth: Payer: Self-pay | Admitting: *Deleted

## 2020-05-25 NOTE — Telephone Encounter (Signed)
   Telephone encounter was:  Unsuccessful.  05/25/2020 Name: Brent [name] MRN: [id number] DOB: Jan 14, 1945  Unsuccessful outbound call made today to assist with:  Transportation Needs  and Food Insecurity  Outreach Attempt:  3rd Attempt.  Referral closed unable to contact patient.  No answer , says wrong number   .  [place], Care Management  [phone] [address] , [place] [address] Email : [email or web address] [email or web address]. [email or web address]

## 2020-06-01 ENCOUNTER — Telehealth: Payer: Self-pay

## 2020-06-01 NOTE — Telephone Encounter (Signed)
Copied from [place] [phone]. Topic: General - Other >> Jun 01, 2020  9:48 AM Oneta Rack wrote: Reason for CRM:  patient would like a follow up call from PCP nurse best # [phone]. Patient states nurse was assisting him regarding financial assistance and he misplaced the #. Advised patient [facility] has been trying to reach him regarding Transportation Needs  and Food Insecurity and he states that's not what it was concerning.

## 2020-06-02 NOTE — Telephone Encounter (Signed)
No, I am not.

## 2020-06-02 NOTE — Telephone Encounter (Signed)
Are you working with this patient

## 2020-06-09 NOTE — Telephone Encounter (Signed)
Left message for patient that our office did not call

## 2020-06-10 ENCOUNTER — Other Ambulatory Visit: Payer: Self-pay | Admitting: Cardiovascular Disease

## 2020-06-10 DIAGNOSIS — M79604 Pain in right leg: Secondary | ICD-10-CM

## 2020-06-15 ENCOUNTER — Ambulatory Visit: Payer: Medicare PPO | Admitting: Unknown Physician Specialty

## 2020-06-15 ENCOUNTER — Encounter: Payer: Self-pay | Admitting: Unknown Physician Specialty

## 2020-06-15 ENCOUNTER — Other Ambulatory Visit: Payer: Self-pay

## 2020-06-15 VITALS — BP 120/72 | HR 76 | Temp 98.1°F | Resp 16 | Ht 69.0 in | Wt 118.5 lb

## 2020-06-15 DIAGNOSIS — I10 Essential (primary) hypertension: Secondary | ICD-10-CM | POA: Diagnosis not present

## 2020-06-15 DIAGNOSIS — G610 Guillain-Barre syndrome: Secondary | ICD-10-CM

## 2020-06-15 DIAGNOSIS — Z5181 Encounter for therapeutic drug level monitoring: Secondary | ICD-10-CM | POA: Diagnosis not present

## 2020-06-15 DIAGNOSIS — M79604 Pain in right leg: Secondary | ICD-10-CM

## 2020-06-15 DIAGNOSIS — E7800 Pure hypercholesterolemia, unspecified: Secondary | ICD-10-CM | POA: Diagnosis not present

## 2020-06-15 DIAGNOSIS — Z139 Encounter for screening, unspecified: Secondary | ICD-10-CM

## 2020-06-15 DIAGNOSIS — I2510 Atherosclerotic heart disease of native coronary artery without angina pectoris: Secondary | ICD-10-CM | POA: Diagnosis not present

## 2020-06-15 MED ORDER — ATORVASTATIN CALCIUM 40 MG PO TABS: 40.0000 mg | ORAL_TABLET | Freq: Every day | ORAL | 3 refills | Status: DC

## 2020-06-15 MED ORDER — GABAPENTIN 100 MG PO CAPS: 100.0000 mg | ORAL_CAPSULE | Freq: Every day | ORAL | 1 refills | Status: DC

## 2020-06-15 NOTE — Progress Notes (Signed)
BP 120/72   Pulse 76   Temp 98.1 F (36.7 C) (Oral)   Resp 16   Ht 5\' 9"  (1.753 m)   Wt 118 lb 8 oz (53.8 kg)   SpO2 98%   BMI 17.50 kg/m    Subjective:    Patient ID: Brent [name], male    DOB: [birth year], 75 y.o.   MRN: [id number]  HPI: Brent [name] is a 75 y.o. male  Chief Complaint  Patient presents with  . Hypertension  . Hyperlipidemia    Follow up   Hypertension Using medications without difficulty Average home BPs: Not checking   No problems or lightheadedness No chest pain with exertion or shortness of breath No Edema   Hyperlipidemia Only taking 20 mg as 80 mg made him feel bad No Muscle aches  Diet compliance: Bad now Exercise: walks  Wonders if he needs to take both Plavix and aspirin  Right leg pain Takes Gabapentin which helps.  Does not need it daily   Relevant past medical, surgical, family and social history reviewed and updated as indicated. Interim medical history since our last visit reviewed. Allergies and medications reviewed and updated.   Per HPI unless specifically indicated above     Objective:    BP 120/72   Pulse 76   Temp 98.1 F (36.7 C) (Oral)   Resp 16   Ht 5\' 9"  (1.753 m)   Wt 118 lb 8 oz (53.8 kg)   SpO2 98%   BMI 17.50 kg/m   Wt Readings from Last 3 Encounters:  06/15/20 118 lb 8 oz (53.8 kg)  05/20/20 120 lb 11.2 oz (54.7 kg)  04/29/20 122 lb 2 oz (55.4 kg)    Physical Exam Constitutional:      General: He is not in acute distress.    Appearance: Normal appearance. He is well-developed.  HENT:     Head: Normocephalic and atraumatic.  Eyes:     General: Lids are normal. No scleral icterus.       Right eye: No discharge.        Left eye: No discharge.     Conjunctiva/sclera: Conjunctivae normal.  Neck:     Vascular: No carotid bruit or JVD.  Cardiovascular:     Rate and Rhythm: Normal rate and regular rhythm.     Heart sounds: Normal heart sounds.  Pulmonary:     Effort: Pulmonary  effort is normal. No respiratory distress.     Breath sounds: Normal breath sounds.  Abdominal:     Palpations: There is no hepatomegaly or splenomegaly.  Musculoskeletal:        General: Normal range of motion.     Cervical back: Normal range of motion and neck supple.  Skin:    General: Skin is warm and dry.     Coloration: Skin is not pale.     Findings: No rash.  Neurological:     Mental Status: He is alert and oriented to person, place, and time.  Psychiatric:        Behavior: Behavior normal.        Thought Content: Thought content normal.        Judgment: Judgment normal.        Assessment & Plan:   Problem List Items Addressed This Visit      Unprioritized   Coronary artery disease   Relevant Medications   atorvastatin (LIPITOR) 40 MG tablet   Other Relevant Orders   Lipid panel  Comprehensive metabolic panel   CBC with Differential/Platelet   Guillain Barr syndrome (HCC)   Relevant Medications   gabapentin (NEURONTIN) 100 MG capsule   Leg pain, right    Stable, continue present medications.         Other Visit Diagnoses    Hypercholesteremia    -  Primary   Check lipid panel today.  Encouraged to try 40 mg Atorvastatin instead of 20mg .  Did not tolerate 80 mg   Relevant Medications   atorvastatin (LIPITOR) 40 MG tablet   Other Relevant Orders   Lipid panel   Comprehensive metabolic panel   CBC with Differential/Platelet   Medication monitoring encounter       Check CBC as taking both ASA and Plavix   Relevant Orders   Lipid panel   Comprehensive metabolic panel   CBC with Differential/Platelet   Primary hypertension       Stable.  Coninue present medications   Relevant Medications   atorvastatin (LIPITOR) 40 MG tablet   Other Relevant Orders   Lipid panel   Comprehensive metabolic panel   CBC with Differential/Platelet      Recommended Plavix only but will confirm with Dr. Fletcher Anon  Also needs referral again to social work for financial  help  Follow up plan: Return in about 6 months (around 12/15/2020).

## 2020-06-15 NOTE — Assessment & Plan Note (Signed)
Stable, continue present medications.   

## 2020-06-16 LAB — COMPREHENSIVE METABOLIC PANEL
AG Ratio: 1.6 (calc) (ref 1.0–2.5)
ALT: 21 U/L (ref 9–46)
AST: 27 U/L (ref 10–35)
Albumin: 4.4 g/dL (ref 3.6–5.1)
Alkaline phosphatase (APISO): 65 U/L (ref 35–144)
BUN: 11 mg/dL (ref 7–25)
CO2: 27 mmol/L (ref 20–32)
Calcium: 9.7 mg/dL (ref 8.6–10.3)
Chloride: 101 mmol/L (ref 98–110)
Creat: 0.76 mg/dL (ref 0.70–1.18)
Globulin: 2.8 g/dL (calc) (ref 1.9–3.7)
Glucose, Bld: 92 mg/dL (ref 65–99)
Potassium: 5.1 mmol/L (ref 3.5–5.3)
Sodium: 138 mmol/L (ref 135–146)
Total Bilirubin: 0.3 mg/dL (ref 0.2–1.2)
Total Protein: 7.2 g/dL (ref 6.1–8.1)

## 2020-06-16 LAB — CBC WITH DIFFERENTIAL/PLATELET
Absolute Monocytes: 529 cells/uL (ref 200–950)
Basophils Absolute: 41 cells/uL (ref 0–200)
Basophils Relative: 1 %
Eosinophils Absolute: 148 cells/uL (ref 15–500)
Eosinophils Relative: 3.6 %
HCT: 46.2 % (ref 38.5–50.0)
Hemoglobin: 15.3 g/dL (ref 13.2–17.1)
Lymphs Abs: 1492 cells/uL (ref 850–3900)
MCH: 31.5 pg (ref 27.0–33.0)
MCHC: 33.1 g/dL (ref 32.0–36.0)
MCV: 95.1 fL (ref 80.0–100.0)
MPV: 9.5 fL (ref 7.5–12.5)
Monocytes Relative: 12.9 %
Neutro Abs: 1890 cells/uL (ref 1500–7800)
Neutrophils Relative %: 46.1 %
Platelets: 273 10*3/uL (ref 140–400)
RBC: 4.86 10*6/uL (ref 4.20–5.80)
RDW: 12 % (ref 11.0–15.0)
Total Lymphocyte: 36.4 %
WBC: 4.1 10*3/uL (ref 3.8–10.8)

## 2020-06-16 LAB — LIPID PANEL
Cholesterol: 175 mg/dL (ref <200)
HDL: 51 mg/dL (ref >=40)
LDL Cholesterol (Calc): 104 mg/dL (calc) — ABNORMAL HIGH
Non-HDL Cholesterol (Calc): 124 mg/dL (calc) (ref <130)
Total CHOL/HDL Ratio: 3.4 (calc) (ref <5.0)
Triglycerides: 102 mg/dL (ref <150)

## 2020-06-17 ENCOUNTER — Ambulatory Visit (INDEPENDENT_AMBULATORY_CARE_PROVIDER_SITE_OTHER): Payer: Medicare PPO

## 2020-06-17 ENCOUNTER — Other Ambulatory Visit: Payer: Self-pay

## 2020-06-17 DIAGNOSIS — M79604 Pain in right leg: Secondary | ICD-10-CM | POA: Diagnosis not present

## 2020-06-18 ENCOUNTER — Telehealth: Payer: Self-pay | Admitting: *Deleted

## 2020-06-18 NOTE — Chronic Care Management (AMB) (Signed)
  Chronic Care Management   Note  06/18/2020 Name: Brent [name] MRN: [id number] DOB: April 29, 1945  Brent [name] is a 75 y.o. year old male who is a primary care patient of [facility]. I reached out to [name] by phone today in response to a referral sent by Brent [name]'s PCP, Kathrine Haddock NP .     [name] was given information about Chronic Care Management services today including:  CCM service includes personalized support from designated clinical staff supervised by his physician, including individualized plan of care and coordination with other care providers 24/7 contact phone numbers for assistance for urgent and routine care needs. Service will only be billed when office clinical staff spend 20 minutes or more in a month to coordinate care. Only one practitioner may furnish and bill the service in a calendar month. The patient may stop CCM services at any time (effective at the end of the month) by phone call to the office staff. The patient will be responsible for cost sharing (co-pay) of up to 20% of the service fee (after annual deductible is met).  Patient agreed to services and verbal consent obtained.   Follow up plan: Telephone appointment with care management team member scheduled for:06/21/2020  [place] Management

## 2020-06-21 ENCOUNTER — Ambulatory Visit (INDEPENDENT_AMBULATORY_CARE_PROVIDER_SITE_OTHER): Payer: Medicare PPO | Admitting: *Deleted

## 2020-06-21 DIAGNOSIS — E7800 Pure hypercholesterolemia, unspecified: Secondary | ICD-10-CM | POA: Diagnosis not present

## 2020-06-21 DIAGNOSIS — I10 Essential (primary) hypertension: Secondary | ICD-10-CM

## 2020-06-21 DIAGNOSIS — Z5941 Food insecurity: Secondary | ICD-10-CM

## 2020-06-21 DIAGNOSIS — Z599 Problem related to housing and economic circumstances, unspecified: Secondary | ICD-10-CM

## 2020-06-22 NOTE — Patient Instructions (Signed)
Visit Information   Goals Addressed             This Visit's Progress    Find Help in My Community       Timeframe:  Long-Range Goal Priority:  Medium Start Date:      06/21/20                       Expected End Date:  12/22/20                     Follow Up Date 07/05/20    - begin a notebook of services in my neighborhood or community - call 211 when I need some help - follow-up on any referrals for help I am given - think ahead to make sure my need does not become an emergency - have a back-up plan - make a list of family or friends that I can call    Why is this important?   Knowing how and where to find help for yourself or family in your neighborhood and community is an important skill.  You will want to take some steps to learn how.    Notes:          The patient verbalized understanding of instructions, educational materials, and care plan provided today and declined offer to receive copy of patient instructions, educational materials, and care plan.   Telephone follow up appointment with care management team member scheduled for: 06/29/16  Elliot Gurney, [place] Worker  [facility] Care Management [phone]

## 2020-06-22 NOTE — Chronic Care Management (AMB) (Signed)
Chronic Care Management    Clinical Social Work Note  06/22/2020 Name: Brent [name] MRN: [id number] DOB: 1945-11-26  Brent [name] is a 75 y.o. year old male who is a primary care patient of [place]. The CCM team was consulted to assist the patient with chronic disease management and/or care coordination needs related to: [employer] .   Engaged with patient by telephone for initial visit in response to provider referral for social work chronic care management and care coordination services.   Consent to Services:  The patient was given the following information about Chronic Care Management services today, agreed to services, and gave verbal consent: 1. CCM service includes personalized support from designated clinical staff supervised by the primary care provider, including individualized plan of care and coordination with other care providers 2. 24/7 contact phone numbers for assistance for urgent and routine care needs. 3. Service will only be billed when office clinical staff spend 20 minutes or more in a month to coordinate care. 4. Only one practitioner may furnish and bill the service in a calendar month. 5.The patient may stop CCM services at any time (effective at the end of the month) by phone call to the office staff. 6. The patient will be responsible for cost sharing (co-pay) of up to 20% of the service fee (after annual deductible is met). Patient agreed to services and consent obtained.  Patient agreed to services and consent obtained.   Assessment: Review of patient past medical history, allergies, medications, and health status, including review of relevant consultants reports was performed today as part of a comprehensive evaluation and provision of chronic care management and care coordination services.     SDOH (Social Determinants of Health) assessments and interventions performed:  SDOH Interventions    Flowsheet Row Most Recent  Value  SDOH Interventions   Food Insecurity Interventions Assist with SNAP Application, [id number] Referral  Financial Strain Interventions [id number] Referral        Advanced Directives Status: Not addressed in this encounter.  CCM Care Plan  Allergies  Allergen Reactions   Influenza Vaccines     GUILLAIN BARRE SYNDROME    Pneumococcal Vaccine Other (See Comments)    GUILLAIN BARRE SYNDROME NUMBNESS, SOB,CHEST PAIN    Prevnar [Pneumococcal 13-Val Conj Vacc] Other (See Comments)    GUILLAIN-BARRE SYNDROME    Outpatient Encounter Medications as of 06/21/2020  Medication Sig   acetaminophen (TYLENOL) 325 MG tablet Take 650 mg by mouth every 6 (six) hours as needed.    albuterol (VENTOLIN HFA) 108 (90 Base) MCG/ACT inhaler Inhale 2 puffs into the lungs every 6 (six) hours as needed for wheezing or shortness of breath.   aspirin EC 81 MG EC tablet Take 1 tablet (81 mg total) by mouth daily.   atorvastatin (LIPITOR) 40 MG tablet Take 1 tablet (40 mg total) by mouth daily.   clopidogrel (PLAVIX) 75 MG tablet Take 1 tablet (75 mg total) by mouth daily with breakfast.   gabapentin (NEURONTIN) 100 MG capsule Take 1 capsule (100 mg total) by mouth daily.   metoprolol succinate (TOPROL-XL) 25 MG 24 hr tablet Take 0.5 tablets (12.5 mg total) by mouth daily.   No facility-administered encounter medications on file as of 06/21/2020.    Patient Active Problem List   Diagnosis Date Noted   Leg pain, right 06/15/2020   Ventricular tachycardia (HCC)    Protein-calorie malnutrition, severe 12/01/2019   Hypotension    NSTEMI (non-ST elevated myocardial infarction) (  [place]) 11/30/2019   Current mild episode of major depressive disorder without prior episode ([place]) 10/29/2019   Chronic midline low back pain without sciatica 10/29/2019   Adenomatous polyp of colon 10/29/2019   Polyp of ascending colon    Atherosclerosis of aorta ([place]) 05/23/2017   Osteopenia 08/29/2016   Weakness 04/03/2016    Underweight 03/27/2016   Cigarette smoker one half pack a day or less 09/24/2015   Incidental lung nodule, > 57m and < 889m07/25/2017   Centrilobular emphysema ([place]07/25/2017   Right Supraclavicular Lymph node 08/03/2015   Coronary artery disease 07/29/2015   Carotid artery calcification 07/29/2015   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[place]07/20/2017   Abnormal chest CT 07/29/2015   Coronary artery calcification seen on CAT scan 07/29/2015   Lymphadenopathy of right cervical region 07/19/2015   Aortic calcification (HCC[place]07/01/2015   Benign neoplasm of transverse colon    Benign neoplasm of descending colon    Benign neoplasm of sigmoid colon    Cataract    Tobacco abuse    History of colonic polyps    Hypochloremia    Low sodium levels 08/18/2013   Back pain without radiation 08/18/2013   Hyposmolality and/or hyponatremia 08/18/2013   Guillain Barr syndrome (HCLeeper08/03/2013    Conditions to be addressed/monitored:  Hypercholesteremia ; Limited access to food  Care Plan : General Social Work (Adult)  Updates made by LaKeyCorpChDarla LeschesLCSW since 06/22/2020 12:00 AM     Problem: CHL AMB "PATIENT-SPECIFIC PROBLEM"   Priority: Medium  Onset Date: 06/21/2020  Note:   Current Barriers:  Financial constraints related to income restraints and Limited access to food Suicidal Ideation/Homicidal Ideation: No  Clinical Social Work Goal(s):  Over the next 90 days, patient will work with SW bi-weekly by telephone or in person to reduce or manage symptoms related to community resource needs patient will follow up with identiified community resource supports to assist with food resources* as directed by SW  Interventions: Patient interviewed and appropriate assessments performed: PHQ 2 SDOH Interventions    Flowsheet Row Most Recent Value  SDOH Interventions   Food Insecurity Interventions Assist with SNAP Application, [id number]  Financial Strain Interventions [id number]      Patient discussed increased financial obligations and very little income left to afford basic necessities including food Patient states very limited family supports to assist and would ne interested in community resources to assist, including applying for food stamps Patient agreeable to [id number] referral as well as receiving community resources for loHormel Foodsanks Active listening / Reflection utilized  and Emotional Supportive Provided  Patient Self Care Activities:  Attends church or other social activities Performs ADL's independently Ability for insight Independent living  Patient Coping Strengths:  [place] Communicate Effectively  Patient Self Care Deficits:  Knowledge deficit regarding local community resources to address resources for food         Follow Up Plan: SW will follow up with patient by phone over the next 7-14 business days      ChOccidental PetroleumLCSaddle Rockorker  [facility] Care Management [phone]

## 2020-06-28 ENCOUNTER — Ambulatory Visit: Payer: Medicare PPO | Admitting: *Deleted

## 2020-06-28 DIAGNOSIS — E7800 Pure hypercholesterolemia, unspecified: Secondary | ICD-10-CM

## 2020-06-28 DIAGNOSIS — Z5941 Food insecurity: Secondary | ICD-10-CM

## 2020-06-28 DIAGNOSIS — Z599 Problem related to housing and economic circumstances, unspecified: Secondary | ICD-10-CM

## 2020-06-28 NOTE — Patient Instructions (Signed)
Visit Information   Goals Addressed             This Visit's Progress    Find Help in My Community       Timeframe:  Long-Range Goal Priority:  Medium Start Date:      06/21/20                       Expected End Date:  12/22/20                     Follow Up Date 07/05/20    - begin a notebook of services in my neighborhood or community - call 211 when I need some help - follow-up on any referrals for help I am given - think ahead to make sure my need does not become an emergency - have a back-up plan - make a list of family or friends that I can call  -complete food stamp application once received   Why is this important?   Knowing how and where to find help for yourself or family in your neighborhood and community is an important skill.  You will want to take some steps to learn how.    Notes:          The patient verbalized understanding of instructions, educational materials, and care plan provided today and declined offer to receive copy of patient instructions, educational materials, and care plan.   Telephone follow up appointment with care management team member scheduled for: 07/05/20  Elliot Gurney, [place] Worker  [facility] Care Management [phone]

## 2020-06-28 NOTE — Chronic Care Management (AMB) (Signed)
Chronic Care Management    Clinical Social Work Note  06/28/2020 Name: Brent [name] MRN: [id number] DOB: February 25, 1945  Brent [name] is a 75 y.o. year old male who is a primary care patient of [place]. The CCM team was consulted to assist the patient with chronic disease management and/or care coordination needs related to: [employer] .   Engaged with patient by telephone for follow up visit in response to provider referral for social work chronic care management and care coordination services.   Consent to Services:  The patient was given information about Chronic Care Management services, agreed to services, and gave verbal consent prior to initiation of services.  Please see initial visit note for detailed documentation.   Patient agreed to services and consent obtained.   Assessment: Review of patient past medical history, allergies, medications, and health status, including review of relevant consultants reports was performed today as part of a comprehensive evaluation and provision of chronic care management and care coordination services.     SDOH (Social Determinants of Health) assessments and interventions performed:    Advanced Directives Status: Not addressed in this encounter.  CCM Care Plan  Allergies  Allergen Reactions   Influenza Vaccines     GUILLAIN BARRE SYNDROME    Pneumococcal Vaccine Other (See Comments)    GUILLAIN BARRE SYNDROME NUMBNESS, SOB,CHEST PAIN    Prevnar [Pneumococcal 13-Val Conj Vacc] Other (See Comments)    GUILLAIN-BARRE SYNDROME    Outpatient Encounter Medications as of 06/28/2020  Medication Sig   acetaminophen (TYLENOL) 325 MG tablet Take 650 mg by mouth every 6 (six) hours as needed.    albuterol (VENTOLIN HFA) 108 (90 Base) MCG/ACT inhaler Inhale 2 puffs into the lungs every 6 (six) hours as needed for wheezing or shortness of breath.   aspirin EC 81 MG EC tablet Take 1 tablet (81 mg total) by  mouth daily.   atorvastatin (LIPITOR) 40 MG tablet Take 1 tablet (40 mg total) by mouth daily.   clopidogrel (PLAVIX) 75 MG tablet Take 1 tablet (75 mg total) by mouth daily with breakfast.   gabapentin (NEURONTIN) 100 MG capsule Take 1 capsule (100 mg total) by mouth daily.   metoprolol succinate (TOPROL-XL) 25 MG 24 hr tablet Take 0.5 tablets (12.5 mg total) by mouth daily.   nitroGLYCERIN (NITROSTAT) 0.4 MG SL tablet Place 1 tablet (0.4 mg total) under the tongue every 5 (five) minutes as needed for chest pain.   No facility-administered encounter medications on file as of 06/28/2020.    Patient Active Problem List   Diagnosis Date Noted   Leg pain, right 06/15/2020   Ventricular tachycardia ([place])    Protein-calorie malnutrition, severe 12/01/2019   Hypotension    NSTEMI (non-ST elevated myocardial infarction) ([place]) 11/30/2019   Current mild episode of major depressive disorder without prior episode ([place]) 10/29/2019   Chronic midline low back pain without sciatica 10/29/2019   Adenomatous polyp of colon 10/29/2019   Polyp of ascending colon    Atherosclerosis of aorta ([place]) 05/23/2017   Osteopenia 08/29/2016   Weakness 04/03/2016   Underweight 03/27/2016   Cigarette smoker one half pack a day or less 09/24/2015   Incidental lung nodule, > 67mm and < 74mm 08/03/2015   Centrilobular emphysema ([place]) 08/03/2015   Right Supraclavicular Lymph node 08/03/2015   Coronary artery disease 07/29/2015   Carotid artery calcification 07/29/2015   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[place]) 07/29/2015   Abnormal chest CT 07/29/2015   Coronary artery calcification  seen on CAT scan 07/29/2015   Lymphadenopathy of right cervical region 07/19/2015   Aortic calcification ([place]) 07/10/2015   Benign neoplasm of transverse colon    Benign neoplasm of descending colon    Benign neoplasm of sigmoid colon    Cataract    Tobacco abuse    History of colonic polyps    Hypochloremia    Low sodium levels 08/18/2013    Back pain without radiation 08/18/2013   Hyposmolality and/or hyponatremia 08/18/2013   Guillain Barr syndrome ([place]) 08/11/2013    Conditions to be addressed/monitored:  food insecurity ; Limited access to [place] : General Social Work (Adult)  Updates made by Vern Claude, LCSW since 06/28/2020 12:00 AM     Problem: CHL AMB "PATIENT-SPECIFIC PROBLEM"   Priority: Medium  Onset Date: 06/21/2020  Note:   Current Barriers:  Financial constraints related to income restraints and Limited access to food Suicidal Ideation/Homicidal Ideation: No  Clinical Social Work Goal(s):  Over the next 90 days, patient will work with SW bi-weekly by telephone or in person to reduce or manage symptoms related to community resource needs patient will follow up with identiified community resource supports to assist with food resources* as directed by SW  Interventions: Patient interviewed and appropriate assessments performed: PHQ 2 SDOH Interventions    Flowsheet Row Most Recent Value  SDOH Interventions   Food Insecurity Interventions Assist with SNAP Application, [id number] Referral  Financial Strain Interventions [id number] Referral     Patient continues to discuss being on a limited income and having increased financial obligations(unexpected medical care and housing repairs)and very little income left to afford basic necessities including food Patient continues to report having very limited family supports to assist and would be interested in community resources to assist, including applying for food stamps-application mailed to his home  NCCares 360 referral completed as well as receiving community resources for [employer] banks Gift card for groceries requested for assistance while community resources are coordinated Active listening / Reflection utilized  and Emotional Supportive Provided  Patient Self Care Activities:  Attends church or other social activities Performs ADL's  independently Ability for insight Independent living  Patient Coping Strengths:  [place] to Communicate Effectively  Patient Self Care Deficits:  Knowledge deficit regarding local community resources to address resources for food         Follow Up Plan: SW will follow up with patient by phone over the next 7-14 business days       [employer], [place] Worker  [facility] Care Management [phone]

## 2020-07-05 ENCOUNTER — Ambulatory Visit: Payer: Medicare PPO | Admitting: *Deleted

## 2020-07-05 DIAGNOSIS — Z599 Problem related to housing and economic circumstances, unspecified: Secondary | ICD-10-CM

## 2020-07-05 DIAGNOSIS — E7800 Pure hypercholesterolemia, unspecified: Secondary | ICD-10-CM

## 2020-07-05 DIAGNOSIS — I10 Essential (primary) hypertension: Secondary | ICD-10-CM

## 2020-07-05 DIAGNOSIS — Z5941 Food insecurity: Secondary | ICD-10-CM

## 2020-07-05 NOTE — Chronic Care Management (AMB) (Signed)
Chronic Care Management    Clinical Social Work Note  07/05/2020 Name: Brent [name] MRN: [id number] DOB: 02/07/1945  Brent [name] is a 75 y.o. year old male who is a primary care patient of [facility]. The CCM team was consulted to assist the patient with chronic disease management and/or care coordination needs related to: [employer] .   Engaged with patient by telephone for follow up visit in response to provider referral for social work chronic care management and care coordination services.   Consent to Services:  The patient was given information about Chronic Care Management services, agreed to services, and gave verbal consent prior to initiation of services.  Please see initial visit note for detailed documentation.   Patient agreed to services and consent obtained.   Assessment: Review of patient past medical history, allergies, medications, and health status, including review of relevant consultants reports was performed today as part of a comprehensive evaluation and provision of chronic care management and care coordination services.     SDOH (Social Determinants of Health) assessments and interventions performed:    Advanced Directives Status: Not addressed in this encounter.  CCM Care Plan  Allergies  Allergen Reactions   Influenza Vaccines     GUILLAIN BARRE SYNDROME    Pneumococcal Vaccine Other (See Comments)    GUILLAIN BARRE SYNDROME NUMBNESS, SOB,CHEST PAIN    Prevnar [Pneumococcal 13-Val Conj Vacc] Other (See Comments)    GUILLAIN-BARRE SYNDROME    Outpatient Encounter Medications as of 07/05/2020  Medication Sig   acetaminophen (TYLENOL) 325 MG tablet Take 650 mg by mouth every 6 (six) hours as needed.    albuterol (VENTOLIN HFA) 108 (90 Base) MCG/ACT inhaler Inhale 2 puffs into the lungs every 6 (six) hours as needed for wheezing or shortness of breath.   aspirin EC 81 MG EC tablet Take 1 tablet (81 mg total) by  mouth daily.   atorvastatin (LIPITOR) 40 MG tablet Take 1 tablet (40 mg total) by mouth daily.   clopidogrel (PLAVIX) 75 MG tablet Take 1 tablet (75 mg total) by mouth daily with breakfast.   gabapentin (NEURONTIN) 100 MG capsule Take 1 capsule (100 mg total) by mouth daily.   metoprolol succinate (TOPROL-XL) 25 MG 24 hr tablet Take 0.5 tablets (12.5 mg total) by mouth daily.   nitroGLYCERIN (NITROSTAT) 0.4 MG SL tablet Place 1 tablet (0.4 mg total) under the tongue every 5 (five) minutes as needed for chest pain.   No facility-administered encounter medications on file as of 07/05/2020.    Patient Active Problem List   Diagnosis Date Noted   Leg pain, right 06/15/2020   Ventricular tachycardia ([place])    Protein-calorie malnutrition, severe 12/01/2019   Hypotension    NSTEMI (non-ST elevated myocardial infarction) ([place]) 11/30/2019   Current mild episode of major depressive disorder without prior episode ([place]) 10/29/2019   Chronic midline low back pain without sciatica 10/29/2019   Adenomatous polyp of colon 10/29/2019   Polyp of ascending colon    Atherosclerosis of aorta ([place]) 05/23/2017   Osteopenia 08/29/2016   Weakness 04/03/2016   Underweight 03/27/2016   Cigarette smoker one half pack a day or less 09/24/2015   Incidental lung nodule, > 62mm and < 1mm 08/03/2015   Centrilobular emphysema ([place]) 08/03/2015   Right Supraclavicular Lymph node 08/03/2015   Coronary artery disease 07/29/2015   Carotid artery calcification 07/29/2015   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[place]) 07/29/2015   Abnormal chest CT 07/29/2015   Coronary artery calcification  seen on CAT scan 07/29/2015   Lymphadenopathy of right cervical region 07/19/2015   Aortic calcification ([place]) 07/10/2015   Benign neoplasm of transverse colon    Benign neoplasm of descending colon    Benign neoplasm of sigmoid colon    Cataract    Tobacco abuse    History of colonic polyps    Hypochloremia    Low sodium levels 08/18/2013    Back pain without radiation 08/18/2013   Hyposmolality and/or hyponatremia 08/18/2013   Guillain Barr syndrome ([place]) 08/11/2013    Conditions to be addressed/monitored:  Hypercholesteremia ; Limited social support and Limited access to food  Care Plan : General Social Work (Adult)  Updates made by [employer], Darla Lesches, LCSW since 07/05/2020 12:00 AM     Problem: CHL AMB "PATIENT-SPECIFIC PROBLEM"   Priority: Medium  Onset Date: 06/21/2020  Note:   Current Barriers:  Financial constraints related to income restraints and Limited access to food Suicidal Ideation/Homicidal Ideation: No  Clinical Social Work Goal(s):  Over the next 90 days, patient will work with SW bi-weekly by telephone or in person to reduce or manage symptoms related to community resource needs patient will follow up with identiified community resource supports to assist with food resources* as directed by SW  Interventions:  SDOH Interventions    Flowsheet Row Most Recent Value  SDOH Interventions   Food Insecurity Interventions Assist with SNAP Application, [id number] Referral  Financial Strain Interventions [id number] Referral     Patient continues to discuss being on a limited income and having increased financial obligations(unexpected medical care and housing repairs)and very little income left to afford basic necessities including food Patient continues to report having very limited family supports to assist and would be interested in community resources to assist, including applying for food stamps-application mailed to his home Gift card for groceries approved through [facility], patient agreed to pick up gift card at the doctor's office  once delivered Alternative options for financial assistance explored Active listening / Reflection utilized  and Emotional Supportive Provided  Patient Self Care Activities:  Attends church or other social activities Performs ADL's independently Ability for  insight Independent living  Patient Coping Strengths:  [place] to Communicate Effectively  Patient Self Care Deficits:  Knowledge deficit regarding local community resources to address resources for food         Follow Up Plan: SW will follow up with patient by phone over the next 7-14 business days      [employer], [place] [facility] Care Management [phone]

## 2020-07-05 NOTE — Patient Instructions (Signed)
Visit Information   Goals Addressed             This Visit's Progress    Find Help in My Community       Timeframe:  Long-Range Goal Priority:  Medium Start Date:      06/21/20                       Expected End Date:  12/22/20                     Follow Up Date 07/16/20    - begin a notebook of services in my neighborhood or community - call 211 when I need some help - follow-up on any referrals for help I am given - think ahead to make sure my need does not become an emergency - have a back-up plan - make a list of family or friends that I can call  -complete food stamp application once received   Why is this important?   Knowing how and where to find help for yourself or family in your neighborhood and community is an important skill.  You will want to take some steps to learn how.    Notes:          The patient verbalized understanding of instructions, educational materials, and care plan provided today and declined offer to receive copy of patient instructions, educational materials, and care plan.   Telephone follow up appointment with care management team member scheduled for:07/16/20  Elliot Gurney, [place] Worker  [facility] Care Management [phone]

## 2020-07-06 ENCOUNTER — Ambulatory Visit: Payer: Self-pay | Admitting: *Deleted

## 2020-07-06 DIAGNOSIS — Z599 Problem related to housing and economic circumstances, unspecified: Secondary | ICD-10-CM

## 2020-07-06 DIAGNOSIS — Z5941 Food insecurity: Secondary | ICD-10-CM

## 2020-07-06 DIAGNOSIS — E7800 Pure hypercholesterolemia, unspecified: Secondary | ICD-10-CM

## 2020-07-06 DIAGNOSIS — I10 Essential (primary) hypertension: Secondary | ICD-10-CM

## 2020-07-06 NOTE — Chronic Care Management (AMB) (Addendum)
Chronic Care Management    Clinical Social Work Note  07/06/2020 Name: Brent [name] MRN: [id number] DOB: 1945/09/08  Brent [name] is a 75 y.o. year old male who is a primary care patient of [facility]. The CCM team was consulted to assist the patient with chronic disease management and/or care coordination needs related to: [employer] .   Engaged with patient by telephone for follow up visit in response to provider referral for social work chronic care management and care coordination services.   Consent to Services:  The patient was given information about Chronic Care Management services, agreed to services, and gave verbal consent prior to initiation of services.  Please see initial visit note for detailed documentation.   Patient agreed to services and consent obtained.   Assessment: Review of patient past medical history, allergies, medications, and health status, including review of relevant consultants reports was performed today as part of a comprehensive evaluation and provision of chronic care management and care coordination services.     SDOH (Social Determinants of Health) assessments and interventions performed:    Advanced Directives Status: Not addressed in this encounter.  CCM Care Plan  Allergies  Allergen Reactions   Influenza Vaccines     GUILLAIN BARRE SYNDROME    Pneumococcal Vaccine Other (See Comments)    GUILLAIN BARRE SYNDROME NUMBNESS, SOB,CHEST PAIN    Prevnar [Pneumococcal 13-Val Conj Vacc] Other (See Comments)    GUILLAIN-BARRE SYNDROME    Outpatient Encounter Medications as of 07/06/2020  Medication Sig   acetaminophen (TYLENOL) 325 MG tablet Take 650 mg by mouth every 6 (six) hours as needed.    albuterol (VENTOLIN HFA) 108 (90 Base) MCG/ACT inhaler Inhale 2 puffs into the lungs every 6 (six) hours as needed for wheezing or shortness of breath.   aspirin EC 81 MG EC tablet Take 1 tablet (81 mg total) by  mouth daily.   atorvastatin (LIPITOR) 40 MG tablet Take 1 tablet (40 mg total) by mouth daily.   clopidogrel (PLAVIX) 75 MG tablet Take 1 tablet (75 mg total) by mouth daily with breakfast.   gabapentin (NEURONTIN) 100 MG capsule Take 1 capsule (100 mg total) by mouth daily.   metoprolol succinate (TOPROL-XL) 25 MG 24 hr tablet Take 0.5 tablets (12.5 mg total) by mouth daily.   nitroGLYCERIN (NITROSTAT) 0.4 MG SL tablet Place 1 tablet (0.4 mg total) under the tongue every 5 (five) minutes as needed for chest pain.   No facility-administered encounter medications on file as of 07/06/2020.    Patient Active Problem List   Diagnosis Date Noted   Leg pain, right 06/15/2020   Ventricular tachycardia ([place])    Protein-calorie malnutrition, severe 12/01/2019   Hypotension    NSTEMI (non-ST elevated myocardial infarction) ([place]) 11/30/2019   Current mild episode of major depressive disorder without prior episode ([place]) 10/29/2019   Chronic midline low back pain without sciatica 10/29/2019   Adenomatous polyp of colon 10/29/2019   Polyp of ascending colon    Atherosclerosis of aorta ([place]) 05/23/2017   Osteopenia 08/29/2016   Weakness 04/03/2016   Underweight 03/27/2016   Cigarette smoker one half pack a day or less 09/24/2015   Incidental lung nodule, > 26mm and < 68mm 08/03/2015   Centrilobular emphysema ([place]) 08/03/2015   Right Supraclavicular Lymph node 08/03/2015   Coronary artery disease 07/29/2015   Carotid artery calcification 07/29/2015   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/2015   Abnormal chest CT 07/29/2015   Coronary artery calcification  seen on CAT scan 07/29/2015   Lymphadenopathy of right cervical region 07/19/2015   Aortic calcification ([place]) 07/10/2015   Benign neoplasm of transverse colon    Benign neoplasm of descending colon    Benign neoplasm of sigmoid colon    Cataract    Tobacco abuse    History of colonic polyps    Hypochloremia    Low sodium levels 08/18/2013    Back pain without radiation 08/18/2013   Hyposmolality and/or hyponatremia 08/18/2013   Guillain Barr syndrome ([place]) 08/11/2013    Conditions to be addressed/monitored:  Hypercholesteremia ; Limited social support and Limited access to food  Care Plan : General Social Work (Adult)  Updates made by [employer], Darla Lesches, LCSW since 07/06/2020 12:00 AM     Problem: CHL AMB "PATIENT-SPECIFIC PROBLEM"   Priority: Medium  Onset Date: 06/21/2020  Note:   Current Barriers:  Financial constraints related to income restraints and Limited access to food Suicidal Ideation/Homicidal Ideation: No  Clinical Social Work Goal(s):  Over the next 90 days, patient will work with SW bi-weekly by telephone or in person to reduce or manage symptoms related to community resource needs patient will follow up with identiified community resource supports to assist with food resources* as directed by SW  Interventions:  SDOH Interventions    Flowsheet Row Most Recent Value  SDOH Interventions   Food Insecurity Interventions Assist with SNAP Application, [id number] Referral  Financial Strain Interventions [id number] Referral     Patient continues to discuss being on a limited income and having increased financial obligations(unexpected medical care and housing repairs)and very little income left to afford basic necessities including food Patient continues to report having very limited family supports to assist and would be interested in community resources to assist, including applying for food stamps-application mailed to his home Gift card for groceries approved through [facility], patient agreed to pick up gift card at the doctor's office  once delivered Alternative options for financial assistance explored Active listening / Reflection utilized  and Emotional Supportive Provided 07/06/20 confirmed that gift card has been delivered, patient contacted and agreeable to pick gift card up from the providers office  today  Patient Self Care Activities:  Attends church or other social activities Performs ADL's independently Ability for insight Independent living  Patient Coping Strengths:  [place] to Communicate Effectively  Patient Self Care Deficits:  Knowledge deficit regarding local community resources to address resources for food         Follow Up Plan: SW will follow up with patient by phone over the next 14 business days      [employer], [facility] Care Management [phone]

## 2020-07-06 NOTE — Patient Instructions (Addendum)
Visit Information   Goals Addressed             This Visit's Progress    Find Help in My Community       Timeframe:  Long-Range Goal Priority:  Medium Start Date:      06/21/20                       Expected End Date:  12/22/20                     Follow Up Date 07/16/20    - begin a notebook of services in my neighborhood or community - call 211 when I need some help - follow-up on any referrals for help I am given - think ahead to make sure my need does not become an emergency - have a back-up plan - make a list of family or friends that I can call  -complete food stamp application once received -pick up gift card from your providers office   Why is this important?   Knowing how and where to find help for yourself or family in your neighborhood and community is an important skill.  You will want to take some steps to learn how.    Notes:          The patient verbalized understanding of instructions, educational materials, and care plan provided today and declined offer to receive copy of patient instructions, educational materials, and care plan.   Telephone follow up appointment with care management team member scheduled for:07/19/20  Elliot Gurney, [place] Worker  [facility] Care Management [phone]

## 2020-07-19 ENCOUNTER — Ambulatory Visit (INDEPENDENT_AMBULATORY_CARE_PROVIDER_SITE_OTHER): Payer: Medicare PPO | Admitting: *Deleted

## 2020-07-19 DIAGNOSIS — E7800 Pure hypercholesterolemia, unspecified: Secondary | ICD-10-CM

## 2020-07-19 DIAGNOSIS — Z599 Problem related to housing and economic circumstances, unspecified: Secondary | ICD-10-CM

## 2020-07-19 DIAGNOSIS — Z5948 Other specified lack of adequate food: Secondary | ICD-10-CM | POA: Diagnosis not present

## 2020-07-19 DIAGNOSIS — Z5941 Food insecurity: Secondary | ICD-10-CM

## 2020-07-19 NOTE — Patient Instructions (Signed)
Visit Information   Goals Addressed             This Visit's Progress    Find Help in My Community       Timeframe:  Long-Range Goal Priority:  Medium Start Date:      06/21/20                       Expected End Date:  12/22/20                     Follow Up Date 07/16/20    - begin a notebook of services in my neighborhood or community - call 211 when I need some help - follow-up on any referrals for help I am given - think ahead to make sure my need does not become an emergency - have a back-up plan - make a list of family or friends that I can call  -complete food stamp application and return to the Department of [facility] for processing    Why is this important?   Knowing how and where to find help for yourself or family in your neighborhood and community is an important skill.  You will want to take some steps to learn how.    Notes:          The patient verbalized understanding of instructions, educational materials, and care plan provided today and declined offer to receive copy of patient instructions, educational materials, and care plan.   Telephone follow up appointment with care management team member scheduled for:08/23/20  Izea Livolsi, [place] Worker  [facility] Care Management [phone]

## 2020-07-19 NOTE — Chronic Care Management (AMB) (Addendum)
Chronic Care Management    Clinical Social Work Note  07/19/2020 Name: Brent [name] MRN: [id number] DOB: 07/09/1945  Brent [name] Brent [name] is a 75 y.o. year old male who is a primary care patient of [facility]. The CCM team was consulted to assist the patient with chronic disease management and/or care coordination needs related to: Food Insecurity.   Engaged with patient by telephone for follow up visit in response to provider referral for social work chronic care management and care coordination services.   Consent to Services:  The patient was given information about Chronic Care Management services, agreed to services, and gave verbal consent prior to initiation of services.  Please see initial visit note for detailed documentation.   Patient agreed to services and consent obtained.   Assessment: Review of patient past medical history, allergies, medications, and health status, including review of relevant consultants reports was performed today as part of a comprehensive evaluation and provision of chronic care management and care coordination services.     SDOH (Social Determinants of Health) assessments and interventions performed:    Advanced Directives Status: Not addressed in this encounter.  CCM Care Plan  Allergies  Allergen Reactions   Influenza Vaccines     GUILLAIN BARRE SYNDROME    Pneumococcal Vaccine Other (See Comments)    GUILLAIN BARRE SYNDROME NUMBNESS, SOB,CHEST PAIN    Prevnar [Pneumococcal 13-Val Conj Vacc] Other (See Comments)    GUILLAIN-BARRE SYNDROME    Outpatient Encounter Medications as of 07/19/2020  Medication Sig   acetaminophen (TYLENOL) 325 MG tablet Take 650 mg by mouth every 6 (six) hours as needed.    albuterol (VENTOLIN HFA) 108 (90 Base) MCG/ACT inhaler Inhale 2 puffs into the lungs every 6 (six) hours as needed for wheezing or shortness of breath.   aspirin EC 81 MG EC tablet Take 1 tablet (81 mg total) by mouth  daily.   atorvastatin (LIPITOR) 40 MG tablet Take 1 tablet (40 mg total) by mouth daily.   clopidogrel (PLAVIX) 75 MG tablet Take 1 tablet (75 mg total) by mouth daily with breakfast.   gabapentin (NEURONTIN) 100 MG capsule Take 1 capsule (100 mg total) by mouth daily.   metoprolol succinate (TOPROL-XL) 25 MG 24 hr tablet Take 0.5 tablets (12.5 mg total) by mouth daily.   nitroGLYCERIN (NITROSTAT) 0.4 MG SL tablet Place 1 tablet (0.4 mg total) under the tongue every 5 (five) minutes as needed for chest pain.   No facility-administered encounter medications on file as of 07/19/2020.    Patient Active Problem List   Diagnosis Date Noted   Leg pain, right 06/15/2020   Ventricular tachycardia ([place])    Protein-calorie malnutrition, severe 12/01/2019   Hypotension    NSTEMI (non-ST elevated myocardial infarction) ([place]) 11/30/2019   Current mild episode of major depressive disorder without prior episode ([place]) 10/29/2019   Chronic midline low back pain without sciatica 10/29/2019   Adenomatous polyp of colon 10/29/2019   Polyp of ascending colon    Atherosclerosis of aorta ([place]) 05/23/2017   Osteopenia 08/29/2016   Weakness 04/03/2016   Underweight 03/27/2016   Cigarette smoker one half pack a day or less 09/24/2015   Incidental lung nodule, > 22mm and < 70mm 08/03/2015   Centrilobular emphysema ([place]) 08/03/2015   Right Supraclavicular Lymph node 08/03/2015   Coronary artery disease 07/29/2015   Carotid artery calcification 07/29/2015   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[place]) 07/29/2015   Abnormal chest CT 07/29/2015   Coronary artery calcification seen  on CAT scan 07/29/2015   Lymphadenopathy of right cervical region 07/19/2015   Aortic calcification ([place]) 07/10/2015   Benign neoplasm of transverse colon    Benign neoplasm of descending colon    Benign neoplasm of sigmoid colon    Cataract    Tobacco abuse    History of colonic polyps    Hypochloremia    Low sodium levels 08/18/2013   Back pain  without radiation 08/18/2013   Hyposmolality and/or hyponatremia 08/18/2013   Guillain Barr syndrome ([place]) 08/11/2013    Conditions to be addressed/monitored:  Hypercholesterimia ; Limited access to food  Care Plan : General Social Work (Adult)  Updates made by [employer], Darla Lesches, LCSW since 07/19/2020 12:00 AM     Problem: CHL AMB "PATIENT-SPECIFIC PROBLEM"   Priority: Medium  Onset Date: 06/21/2020  Note:   Current Barriers:  Financial constraints related to income restraints and Limited access to food Suicidal Ideation/Homicidal Ideation: No  Clinical Social Work Goal(s):  Over the next 90 days, patient will work with SW bi-weekly by telephone or in person to reduce or manage symptoms related to community resource needs patient will follow up with identiified community resource supports to assist with food resources* as directed by SW  Interventions:  SDOH Interventions    Flowsheet Row Most Recent Value  SDOH Interventions   Food Insecurity Interventions Assist with SNAP Application, [id number] Referral  Financial Strain Interventions [id number] Referral     Patient continues to discuss being on a limited income and having increased financial obligations(unexpected medical care and housing repairs)and very little income left to afford basic necessities including food Patient continues to report having very limited family supports to assist and would be interested in community resources to assist, including applying for food stamps-application mailed to his home Gift card for groceries approved through [facility], patient confirmed that he was able to pick the card up from the doctor's office and was very appreciative Alternative options for financial assistance explored-patient continues to work on Metall[employer]ist and will submit it to [place] when completed Emotional Support continues to be  provided, community resource needs continue  to be assessed.  Patient Self Care Activities:  Attends church or other social activities Performs ADL's independently Ability for insight Independent living  Patient Coping Strengths:  [place] to Communicate Effectively  Patient Self Care Deficits:  Knowledge deficit regarding local community resources to address resources for food         Follow Up Plan: SW will follow up with patient by phone over the next 30 business days      [place], [place] Worker  [facility] Care Management [phone]

## 2020-08-23 ENCOUNTER — Ambulatory Visit (INDEPENDENT_AMBULATORY_CARE_PROVIDER_SITE_OTHER): Payer: Medicare PPO | Admitting: *Deleted

## 2020-08-23 DIAGNOSIS — I10 Essential (primary) hypertension: Secondary | ICD-10-CM

## 2020-08-23 DIAGNOSIS — Z599 Problem related to housing and economic circumstances, unspecified: Secondary | ICD-10-CM

## 2020-08-23 DIAGNOSIS — E7800 Pure hypercholesterolemia, unspecified: Secondary | ICD-10-CM | POA: Diagnosis not present

## 2020-08-23 DIAGNOSIS — Z5941 Food insecurity: Secondary | ICD-10-CM

## 2020-08-23 NOTE — Patient Instructions (Signed)
Visit Information   Goals Addressed             This Visit's Progress    Find Help in My Community       Timeframe:  Long-Range Goal Priority:  Medium Start Date:      06/21/20                       Expected End Date:  08/23/20                   Follow Up Date 08/23/20   - begin a notebook of services in my neighborhood or community - call 211 when I need some help - follow-up on any referrals for help I am given - think ahead to make sure my need does not become an emergency - have a back-up plan - make a list of family or friends that I can call  -complete food stamp application and return to the Department of [place] for processing    Why is this important?   Knowing how and where to find help for yourself or family in your neighborhood and community is an important skill.  You will want to take some steps to learn how.    Notes:         The patient verbalized understanding of instructions, educational materials, and care plan provided today and declined offer to receive copy of patient instructions, educational materials, and care plan.   No further follow up required: patient to call this Education officer, museum with any additional community resource needs  [employer], [place] Worker  [facility] Care Management [phone]

## 2020-08-23 NOTE — Chronic Care Management (AMB) (Signed)
Chronic Care Management    Clinical Social Work Note  08/23/2020 Name: Brent [name] MRN: [id number] DOB: 13-Nov-1945  Brent [name] is a 75 y.o. year old male who is a primary care patient of [facility]. The CCM team was consulted to assist the patient with chronic disease management and/or care coordination needs related to: [employer] .   Engaged with patient by telephone for follow up visit in response to provider referral for social work chronic care management and care coordination services.   Consent to Services:  The patient was given information about Chronic Care Management services, agreed to services, and gave verbal consent prior to initiation of services.  Please see initial visit note for detailed documentation.   Patient agreed to services and consent obtained.   Assessment: Review of patient past medical history, allergies, medications, and health status, including review of relevant consultants reports was performed today as part of a comprehensive evaluation and provision of chronic care management and care coordination services.     SDOH (Social Determinants of Health) assessments and interventions performed:    Advanced Directives Status: Not addressed in this encounter.  CCM Care Plan  Allergies  Allergen Reactions   Influenza Vaccines     GUILLAIN BARRE SYNDROME    Pneumococcal Vaccine Other (See Comments)    GUILLAIN BARRE SYNDROME NUMBNESS, SOB,CHEST PAIN    Prevnar [Pneumococcal 13-Val Conj Vacc] Other (See Comments)    GUILLAIN-BARRE SYNDROME    Outpatient Encounter Medications as of 08/23/2020  Medication Sig   acetaminophen (TYLENOL) 325 MG tablet Take 650 mg by mouth every 6 (six) hours as needed.    albuterol (VENTOLIN HFA) 108 (90 Base) MCG/ACT inhaler Inhale 2 puffs into the lungs every 6 (six) hours as needed for wheezing or shortness of breath.   aspirin EC 81 MG EC tablet Take 1 tablet (81 mg total) by  mouth daily.   atorvastatin (LIPITOR) 40 MG tablet Take 1 tablet (40 mg total) by mouth daily.   clopidogrel (PLAVIX) 75 MG tablet Take 1 tablet (75 mg total) by mouth daily with breakfast.   gabapentin (NEURONTIN) 100 MG capsule Take 1 capsule (100 mg total) by mouth daily.   metoprolol succinate (TOPROL-XL) 25 MG 24 hr tablet Take 0.5 tablets (12.5 mg total) by mouth daily.   nitroGLYCERIN (NITROSTAT) 0.4 MG SL tablet Place 1 tablet (0.4 mg total) under the tongue every 5 (five) minutes as needed for chest pain.   No facility-administered encounter medications on file as of 08/23/2020.    Patient Active Problem List   Diagnosis Date Noted   Leg pain, right 06/15/2020   Ventricular tachycardia ([place])    Protein-calorie malnutrition, severe 12/01/2019   Hypotension    NSTEMI (non-ST elevated myocardial infarction) ([place]) 11/30/2019   Current mild episode of major depressive disorder without prior episode ([place]) 10/29/2019   Chronic midline low back pain without sciatica 10/29/2019   Adenomatous polyp of colon 10/29/2019   Polyp of ascending colon    Atherosclerosis of aorta ([place]) 05/23/2017   Osteopenia 08/29/2016   Weakness 04/03/2016   Underweight 03/27/2016   Cigarette smoker one half pack a day or less 09/24/2015   Incidental lung nodule, > 72m and < 881m07/25/2017   Centrilobular emphysema ([place]/25/2017   Right Supraclavicular Lymph node 08/03/2015   Coronary artery disease 07/29/2015   Carotid artery calcification 07/29/2015   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[place]/20/2017   Abnormal chest CT 07/29/2015   Coronary artery calcification  seen on CAT scan 07/29/2015   Lymphadenopathy of right cervical region 07/19/2015   Aortic calcification ([place]) 07/10/2015   Benign neoplasm of transverse colon    Benign neoplasm of descending colon    Benign neoplasm of sigmoid colon    Cataract    Tobacco abuse    History of colonic polyps    Hypochloremia    Low sodium levels 08/18/2013    Back pain without radiation 08/18/2013   Hyposmolality and/or hyponatremia 08/18/2013   Guillain Barr syndrome ([place]) 08/11/2013    Conditions to be addressed/monitored:  Hypercholesteremia ; Limited access to food  Care Plan : General Social Work (Adult)  Updates made by [employer], Darla Lesches, LCSW since 08/23/2020 12:00 AM     Problem: CHL AMB "PATIENT-SPECIFIC PROBLEM"   Priority: Medium  Onset Date: 06/21/2020  Note:   Current Barriers:  Financial constraints related to income restraints and Limited access to food Suicidal Ideation/Homicidal Ideation: No  Clinical Social Work Goal(s):  Over the next 90 days, patient will work with SW bi-weekly by telephone or in person to reduce or manage symptoms related to community resource needs patient will follow up with identiified community resource supports to assist with food resources* as directed by SW  Interventions:  SDOH Interventions    Flowsheet Row Most Recent Value  SDOH Interventions   Food Insecurity Interventions Assist with SNAP Application, [id number] Referral  Financial Strain Interventions [id number] Referral     Patient continues to discuss being on a limited income and having increased financial obligations(unexpected medical care and housing repairs)and very little income left to afford basic necessities including food Patient continues to report having very limited family supports to assist and would be interested in community resources to assist,  food stamps application mailed to patient's home and has been received-patient still working on completing the application Patient verbalized having no additional needs at this time, declined need for help with the food stamp application Patient agrees to continue to work on Metall[employer]ist and will submit it to [employer] when completed Emotional Support and reassurance continues to be  provided, additional community  resource needs  assessed with patient declining any additional needs at this time.  Patient Self Care Activities:  Attends church or other social activities Performs ADL's independently Ability for insight Independent living  Patient Coping Strengths:  [place] to Communicate Effectively  Patient Self Care Deficits:  Knowledge deficit regarding local community resources to address resources for food         Follow Up Plan: Client will contact this social worker with any additional community resource needs      [place], [place] Worker  [facility] Care Management [phone]

## 2020-09-07 ENCOUNTER — Telehealth: Payer: Self-pay

## 2020-09-07 NOTE — Telephone Encounter (Signed)
LVM for pt to call and schedule an appt  Copied from [place] [id number]. Topic: Appointment Scheduling - Scheduling Inquiry for Clinic >> Sep 07, 2020  8:47 AM Celene Kras wrote: Reason for CRM: Insurance calling with pt stating that the pt is needing to have an A1C check and a kidney test. Please advise.

## 2020-09-07 NOTE — Telephone Encounter (Signed)
Please schedule appointment.

## 2020-09-23 ENCOUNTER — Ambulatory Visit: Payer: Medicare PPO | Admitting: Family Medicine

## 2020-09-23 ENCOUNTER — Encounter: Payer: Self-pay | Admitting: Family Medicine

## 2020-09-23 ENCOUNTER — Other Ambulatory Visit: Payer: Self-pay

## 2020-09-23 VITALS — BP 108/64 | HR 93 | Temp 97.9°F | Resp 16 | Ht 69.0 in | Wt 118.9 lb

## 2020-09-23 DIAGNOSIS — Z5181 Encounter for therapeutic drug level monitoring: Secondary | ICD-10-CM | POA: Diagnosis not present

## 2020-09-23 DIAGNOSIS — G610 Guillain-Barre syndrome: Secondary | ICD-10-CM | POA: Diagnosis not present

## 2020-09-23 DIAGNOSIS — F320 Major depressive disorder, single episode, mild: Secondary | ICD-10-CM | POA: Diagnosis not present

## 2020-09-23 DIAGNOSIS — R739 Hyperglycemia, unspecified: Secondary | ICD-10-CM | POA: Diagnosis not present

## 2020-09-23 DIAGNOSIS — E7800 Pure hypercholesterolemia, unspecified: Secondary | ICD-10-CM | POA: Diagnosis not present

## 2020-09-23 DIAGNOSIS — I10 Essential (primary) hypertension: Secondary | ICD-10-CM | POA: Diagnosis not present

## 2020-09-23 DIAGNOSIS — J432 Centrilobular emphysema: Secondary | ICD-10-CM | POA: Diagnosis not present

## 2020-09-23 DIAGNOSIS — I700 Atherosclerosis of aorta: Secondary | ICD-10-CM

## 2020-09-23 DIAGNOSIS — I25119 Atherosclerotic heart disease of native coronary artery with unspecified angina pectoris: Secondary | ICD-10-CM

## 2020-09-23 DIAGNOSIS — F17200 Nicotine dependence, unspecified, uncomplicated: Secondary | ICD-10-CM

## 2020-09-23 MED ORDER — GABAPENTIN 100 MG PO CAPS: 100.0000 mg | ORAL_CAPSULE | Freq: Every day | ORAL | 3 refills | Status: DC

## 2020-09-23 NOTE — Patient Instructions (Addendum)
Per Cardiology - continue plavix and aspirin for one year - through November (only 2 more months) and follow up with cardiology   Their note from your heart Cath last November -  Recommendations: I do not see a clear culprit for non-ST elevation myocardial infarction. Exact etiology for this degree of troponin elevation is still not entirely clear but could be due to an occluded vessel with subsequent recanalization. I am going to add Plavix in addition to aspirin to be used for at least a year. I increase atorvastatin to 80 mg. We will obtain an echocardiogram to evaluate RV function and make sure there is no other etiology for troponin elevation.   Team Member Role and Air traffic controller Info Address Start End Comments  Wellington Hampshire, MD Consulting Physician (Cardiology) Phone: [phone] Fax: [phone] Email: [email or web address]  [address] [place]  [address] 05/20/2020 - -    Call for follow up appointment for October - due for 6 month follow up

## 2020-09-23 NOTE — Progress Notes (Signed)
Name: Brent [name]   MRN: [id number]    DOB: [birth year]   Date:09/23/2020       Progress Note  Chief Complaint  Patient presents with   Hypertension   Hyperlipidemia     Subjective:   Brent [name] is a 75 y.o. male, presents to clinic for routine f/up  Hypertension:  Currently managed on metoprolol - hx of NSTEMI recently -not on ACEI/ARB, BP at goal and low normal, on ASA and plavix Pt reports good med compliance and denies any SE.   Blood pressure today is well controlled. BP Readings from Last 3 Encounters:  09/23/20 108/64  06/15/20 120/72  05/20/20 122/66   Pt denies CP, SOB, exertional sx, LE edema, palpitation, Ha's, visual disturbances, lightheadedness, hypotension, syncope.  Pulse Readings from Last 3 Encounters:  09/23/20 93  06/15/20 76  05/20/20 96     Hx of protein calorie malnutrition - weight stable from 2021 and three months ago Wt Readings from Last 5 Encounters:  09/23/20 118 lb 14.4 oz (53.9 kg)  06/15/20 118 lb 8 oz (53.8 kg)  05/20/20 120 lb 11.2 oz (54.7 kg)  04/29/20 122 lb 2 oz (55.4 kg)  12/18/19 118 lb (53.5 kg)   BMI Readings from Last 5 Encounters:  09/23/20 17.56 kg/m  06/15/20 17.50 kg/m  05/20/20 17.82 kg/m  04/29/20 18.03 kg/m  12/18/19 17.68 kg/m   COPD/emphysema- only using albuterol  HLD - was supposed to take 80 mg but could not tolerate - was taking 20 mg per pt report (?), labs recently checked  Lab Results  Component Value Date   CHOL 175 06/15/2020   HDL 51 06/15/2020   LDLCALC 104 (H) 06/15/2020   TRIG 102 06/15/2020   CHOLHDL 3.4 06/15/2020   Renal function recent labs:  Lab Results  Component Value Date   GFRAA 103 10/29/2019   GFRAA 105 01/31/2019   GFRAA 106 10/28/2018    Lab Results  Component Value Date   CREATININE 0.76 06/15/2020   BUN 11 06/15/2020   NA 138 06/15/2020   K 5.1 06/15/2020   CL 101 06/15/2020   CO2 27 06/15/2020        Current Outpatient Medications:     acetaminophen (TYLENOL) 325 MG tablet, Take 650 mg by mouth every 6 (six) hours as needed. , Disp: , Rfl:    albuterol (VENTOLIN HFA) 108 (90 Base) MCG/ACT inhaler, Inhale 2 puffs into the lungs every 6 (six) hours as needed for wheezing or shortness of breath., Disp: 6.7 g, Rfl: 2   aspirin EC 81 MG EC tablet, Take 1 tablet (81 mg total) by mouth daily., Disp: , Rfl:    atorvastatin (LIPITOR) 40 MG tablet, Take 1 tablet (40 mg total) by mouth daily., Disp: 90 tablet, Rfl: 3   clopidogrel (PLAVIX) 75 MG tablet, Take 1 tablet (75 mg total) by mouth daily with breakfast., Disp: 30 tablet, Rfl: 11   gabapentin (NEURONTIN) 100 MG capsule, Take 1 capsule (100 mg total) by mouth daily., Disp: 90 capsule, Rfl: 1   metoprolol succinate (TOPROL-XL) 25 MG 24 hr tablet, Take 0.5 tablets (12.5 mg total) by mouth daily., Disp: 30 tablet, Rfl: 11   nitroGLYCERIN (NITROSTAT) 0.4 MG SL tablet, Place 1 tablet (0.4 mg total) under the tongue every 5 (five) minutes as needed for chest pain., Disp: 30 tablet, Rfl: 3  Patient Active Problem List   Diagnosis Date Noted   Leg pain, right 06/15/2020   Ventricular tachycardia ([place])  Protein-calorie malnutrition, severe 12/01/2019   Hypotension    NSTEMI (non-ST elevated myocardial infarction) ([place]) 11/30/2019   Current mild episode of major depressive disorder without prior episode ([place]) 10/29/2019   Chronic midline low back pain without sciatica 10/29/2019   Adenomatous polyp of colon 10/29/2019   Polyp of ascending colon    Atherosclerosis of aorta ([place]) 05/23/2017   Osteopenia 08/29/2016   Weakness 04/03/2016   Underweight 03/27/2016   Cigarette smoker one half pack a day or less 09/24/2015   Incidental lung nodule, > 15m and < 848m07/25/2017   Centrilobular emphysema ([place]07/25/2017   Right Supraclavicular Lymph node 08/03/2015   Coronary artery disease 07/29/2015   Carotid artery calcification 07/29/2015   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    Abnormal chest CT 07/29/2015   Coronary artery calcification seen on CAT scan 07/29/2015   Lymphadenopathy of right cervical region 07/19/2015   Aortic calcification ([place]07/01/2015   Benign neoplasm of transverse colon    Benign neoplasm of descending colon    Benign neoplasm of sigmoid colon    Cataract    Tobacco abuse    History of colonic polyps    Hypochloremia    Low sodium levels 08/18/2013   Back pain without radiation 08/18/2013   Hyposmolality and/or hyponatremia 08/18/2013   Guillain Barr syndrome (HC[place]08/03/2013    Past Surgical History:  Procedure Laterality Date   COLONOSCOPY WITH PROPOFOL N/A 09/22/2014   Procedure: COLONOSCOPY WITH PROPOFOL;  Surgeon: DaLucilla LameMD;  Location: [facility] ENDOSCOPY;  Service: Endoscopy;  Laterality: N/A;   COLONOSCOPY WITH PROPOFOL N/A 07/09/2018   Procedure: COLONOSCOPY WITH PROPOFOL;  Surgeon: WoLucilla LameMD;  [facility];  Service: Endoscopy;  Laterality: N/A;   DEEP NECK LYMPH NODE BIOPSY / EXCISION Right    HERNIA REPAIR  1988   LEFT HEART CATH AND CORONARY ANGIOGRAPHY N/A 12/01/2019   Procedure: LEFT HEART CATH AND CORONARY ANGIOGRAPHY;  Surgeon: ArWellington HampshireMD;  [facility];  Service: Cardiovascular;  Laterality: N/A;   LYMPH NODE BIOPSY     NM MYOVIEW ([place])  10/09/2013   Dr. DwLujean AmelEF 58%. Normal wall motion. No ischemia or infarction.   shoulder     SHOULDER ARTHROSCOPY Right    TRANSTHORACIC ECHOCARDIOGRAM  09/24/2013   EF 45-50% with mild global hypokinesis. GR 1 DD. No valvular lesions besides mild TR. Normal RV pressures.    Family History  Problem Relation Age of Onset   Alcohol abuse Mother    Healthy Daughter    Parkinson's disease Other    Stroke Other    Diabetes Sister    Heart disease Other    Heart disease Other    Alcohol abuse Brother    Cancer Paternal Grandfather        pancreatic   Heart disease Sister    Heart disease Sister    Healthy Son     Healthy Daughter    Bladder Cancer Neg Hx    Kidney cancer Neg Hx     Social History   Tobacco Use   Smoking status: Some Days    Packs/day: 0.15    Years: 56.00    Pack years: 8.40    Types: Cigarettes    Start date: 1964   Smokeless tobacco: Never   Tobacco comments:    New quit date 02/24/20 - made 02/05/20; currently smoking 3 cigs per week 05/20/20  Vaping Use   Vaping Use: Never used  Substance  Use Topics   Alcohol use: No   Drug use: No     Allergies  Allergen Reactions   Influenza Vaccines     GUILLAIN BARRE SYNDROME    Pneumococcal Vaccine Other (See Comments)    GUILLAIN BARRE SYNDROME NUMBNESS, SOB,CHEST PAIN    Prevnar [Pneumococcal 13-Val Conj Vacc] Other (See Comments)    GUILLAIN-BARRE SYNDROME    Health Maintenance  Topic Date Due   COVID-19 Vaccine (1) 10/09/2020 (Originally 03/12/1946)   Zoster Vaccines- Shingrix (1 of 2) 12/23/2020 (Originally 09/11/1996)   TETANUS/TDAP  01/09/2021 (Originally 01/10/2019)   COLONOSCOPY (Pts 45-67yr Insurance coverage will need to be confirmed)  07/09/2023   Hepatitis C Screening  Completed   HPV VACCINES  Aged Out   INFLUENZA VACCINE  Discontinued   PNA vac Low Risk Adult  Discontinued    Chart Review Today: I personally reviewed active problem list, medication list, allergies, family history, social history, health maintenance, notes from last encounter, lab results, imaging with the patient/caregiver today.   Review of Systems  Constitutional: Negative.   HENT: Negative.    Eyes: Negative.   Respiratory: Negative.    Cardiovascular: Negative.   Gastrointestinal: Negative.   Endocrine: Negative.   Genitourinary: Negative.   Musculoskeletal: Negative.   Skin: Negative.   Allergic/Immunologic: Negative.   Neurological: Negative.   Hematological: Negative.   Psychiatric/Behavioral: Negative.    All other systems reviewed and are negative.   Objective:   Vitals:   09/23/20 1047  BP: 108/64  Pulse:  93  Resp: 16  Temp: 97.9 F (36.6 C)  SpO2: 99%  Weight: 118 lb 14.4 oz (53.9 kg)  Height: '5\' 9"'$  (1.753 m)    Body mass index is 17.56 kg/m.  Physical Exam Vitals and nursing note reviewed.  Constitutional:      General: He is not in acute distress.    Appearance: Normal appearance. He is well-developed and underweight. He is not ill-appearing, toxic-appearing or diaphoretic.     Interventions: Face mask in place.  HENT:     Head: Normocephalic and atraumatic.     Jaw: No trismus.     Right Ear: External ear normal.     Left Ear: External ear normal.  Eyes:     General: Lids are normal. No scleral icterus.       Right eye: No discharge.        Left eye: No discharge.     Conjunctiva/sclera: Conjunctivae normal.  Neck:     Trachea: Trachea and phonation normal. No tracheal deviation.  Cardiovascular:     Rate and Rhythm: Normal rate and regular rhythm.     Pulses: Normal pulses.          Radial pulses are 2+ on the right side and 2+ on the left side.       Posterior tibial pulses are 2+ on the right side and 2+ on the left side.     Heart sounds: Normal heart sounds. No murmur heard.   No friction rub. No gallop.  Pulmonary:     Effort: Pulmonary effort is normal. No respiratory distress.     Breath sounds: Normal breath sounds. No stridor. No wheezing, rhonchi or rales.  Abdominal:     General: Bowel sounds are normal. There is no distension.     Palpations: Abdomen is soft.  Musculoskeletal:     Right lower leg: No edema.     Left lower leg: No edema.  Skin:    General:  Skin is warm and dry.     Coloration: Skin is not jaundiced.     Findings: No rash.     Nails: There is no clubbing.  Neurological:     Mental Status: He is alert. Mental status is at baseline.     Cranial Nerves: No dysarthria or facial asymmetry.     Motor: No tremor or abnormal muscle tone.     Gait: Gait normal.  Psychiatric:        Mood and Affect: Mood normal.        Speech: Speech  normal.        Behavior: Behavior normal. Behavior is cooperative.        Assessment & Plan:    1. Hypercholesteremia On lower dose of statin - unsure if this is 40 mg or 20 mg Recheck labs and adjust meds are needed for goal of LDL <70 - Lipid Profile - COMPLETE METABOLIC PANEL WITH GFR - Lipid panel  2. Primary hypertension Stable, BP at goal today - only on BB per cardiology, hx of hypotension - COMPLETE METABOLIC PANEL WITH GFR  3. Medication monitoring encounter  - Lipid Profile - COMPLETE METABOLIC PANEL WITH GFR - Lipid panel - Hemoglobin A1c  4. Guillain Barr syndrome (HCC) - gabapentin (NEURONTIN) 100 MG capsule; Take 1 capsule (100 mg total) by mouth daily.  Dispense: 90 capsule; Refill: 3  5. Centrilobular emphysema ([place]) DOE & tired, sometimes using albuterol rescue inhaler Unclear if sx are pulmonary/cardiac or both  6. Current mild episode of major depressive disorder without prior episode [facility]) Depression screen [facility] 2/9 09/23/2020 06/21/2020 06/15/2020  Decreased Interest 0 0 0  Down, Depressed, Hopeless 0 0 0  PHQ - 2 Score 0 0 0  Altered sleeping 0 - -  Tired, decreased energy 0 - -  Change in appetite 0 - -  Feeling bad or failure about yourself  0 - -  Trouble concentrating 0 - -  Moving slowly or fidgety/restless 0 - -  Suicidal thoughts 0 - -  PHQ-9 Score 0 - -  Difficult doing work/chores Not difficult at all - -  Some recent data might be hidden  Phq reviewed and negative today - not on MDD meds   7. Aortic calcification (HCC) On statin, monitoring - Lipid Profile - COMPLETE METABOLIC PANEL WITH GFR - Lipid panel  8. Coronary artery disease involving native coronary artery of native heart with angina pectoris (HCC) F/up with cardiology - no CP with exertion, but dyspnea - unclear if anginal equivalent or pulm disease Will see the lipids are at goal, pt due for f/up - info given - Lipid Profile - COMPLETE METABOLIC PANEL WITH GFR -  Lipid panel  9. Hyperglycemia Last labs showed hyperglycemia - check A1C - COMPLETE METABOLIC PANEL WITH GFR - Hemoglobin A1c  10. Current smoker on some days Reviewed and offered smoking cessation      Pt new to me - did extensive chart review to ensure proper treatment and f/up - he had been lost to f/up without current PCP   Info given to pt on AVS: Per Cardiology - continue plavix and aspirin for one year - through November (only 2 more months) and follow up with cardiology   Their note from your heart Cath last November -  Recommendations: I do not see a clear culprit for non-ST elevation myocardial infarction. Exact etiology for this degree of troponin elevation is still not entirely clear but could be due  to an occluded vessel with subsequent recanalization. I am going to add Plavix in addition to aspirin to be used for at least a year. I increase atorvastatin to 80 mg. We will obtain an echocardiogram to evaluate RV function and make sure there is no other etiology for troponin elevation.   Team Member Role and Air traffic controller Info Address Start End Comments  Wellington Hampshire, MD Consulting Physician (Cardiology) Phone: [phone] Fax: [phone] Email: [email or web address]  [address] [place] [address] 05/20/2020 - -    Call for follow up appointment for October - due for 6 month follow up  Return in about 6 months (around 03/23/2021) for Routine follow-up.   Delsa Grana, PA-C 09/23/20 10:50 AM

## 2020-09-24 LAB — COMPLETE METABOLIC PANEL WITH GFR
AG Ratio: 1.8 (calc) (ref 1.0–2.5)
ALT: 32 U/L (ref 9–46)
AST: 38 U/L — ABNORMAL HIGH (ref 10–35)
Albumin: 4.7 g/dL (ref 3.6–5.1)
Alkaline phosphatase (APISO): 72 U/L (ref 35–144)
BUN: 9 mg/dL (ref 7–25)
CO2: 29 mmol/L (ref 20–32)
Calcium: 9.8 mg/dL (ref 8.6–10.3)
Chloride: 100 mmol/L (ref 98–110)
Creat: 0.75 mg/dL (ref 0.70–1.28)
Globulin: 2.6 g/dL (calc) (ref 1.9–3.7)
Glucose, Bld: 81 mg/dL (ref 65–99)
Potassium: 5 mmol/L (ref 3.5–5.3)
Sodium: 137 mmol/L (ref 135–146)
Total Bilirubin: 0.5 mg/dL (ref 0.2–1.2)
Total Protein: 7.3 g/dL (ref 6.1–8.1)
eGFR: 95 mL/min/{1.73_m2} (ref >=60)

## 2020-09-24 LAB — LIPID PANEL
Cholesterol: 123 mg/dL (ref <200)
HDL: 59 mg/dL (ref >=40)
LDL Cholesterol (Calc): 50 mg/dL (calc)
Non-HDL Cholesterol (Calc): 64 mg/dL (calc) (ref <130)
Total CHOL/HDL Ratio: 2.1 (calc) (ref <5.0)
Triglycerides: 48 mg/dL (ref <150)

## 2020-09-24 LAB — HEMOGLOBIN A1C
Hgb A1c MFr Bld: 5 % of total Hgb (ref <5.7)
Mean Plasma Glucose: 97 mg/dL
eAG (mmol/L): 5.4 mmol/L

## 2020-10-18 ENCOUNTER — Encounter: Payer: Self-pay | Admitting: Family Medicine

## 2020-12-16 ENCOUNTER — Ambulatory Visit: Payer: Medicare PPO | Admitting: Family Medicine

## 2021-03-24 ENCOUNTER — Encounter: Payer: Self-pay | Admitting: Family Medicine

## 2021-03-24 ENCOUNTER — Ambulatory Visit: Payer: Medicare PPO | Admitting: Family Medicine

## 2021-03-24 VITALS — BP 124/62 | HR 99 | Temp 97.7°F | Resp 16 | Ht 69.0 in | Wt 122.0 lb

## 2021-03-24 DIAGNOSIS — I10 Essential (primary) hypertension: Secondary | ICD-10-CM

## 2021-03-24 DIAGNOSIS — R739 Hyperglycemia, unspecified: Secondary | ICD-10-CM

## 2021-03-24 DIAGNOSIS — I700 Atherosclerosis of aorta: Secondary | ICD-10-CM

## 2021-03-24 DIAGNOSIS — E7800 Pure hypercholesterolemia, unspecified: Secondary | ICD-10-CM | POA: Diagnosis not present

## 2021-03-24 DIAGNOSIS — J432 Centrilobular emphysema: Secondary | ICD-10-CM | POA: Diagnosis not present

## 2021-03-24 DIAGNOSIS — G610 Guillain-Barre syndrome: Secondary | ICD-10-CM | POA: Diagnosis not present

## 2021-03-24 DIAGNOSIS — Z5181 Encounter for therapeutic drug level monitoring: Secondary | ICD-10-CM

## 2021-03-24 DIAGNOSIS — I25119 Atherosclerotic heart disease of native coronary artery with unspecified angina pectoris: Secondary | ICD-10-CM | POA: Diagnosis not present

## 2021-03-24 DIAGNOSIS — Z23 Encounter for immunization: Secondary | ICD-10-CM

## 2021-03-24 DIAGNOSIS — Z7902 Long term (current) use of antithrombotics/antiplatelets: Secondary | ICD-10-CM | POA: Diagnosis not present

## 2021-03-24 MED ORDER — ZOSTER VAC RECOMB ADJUVANTED 50 MCG/0.5ML IM SUSR: 0.5000 mL | Freq: Once | INTRAMUSCULAR | 1 refills | Status: AC

## 2021-03-24 NOTE — Patient Instructions (Addendum)
?  You are OVERDUE for cardiology follow up ? ?  ?  ?Team Member Role and Specialty Contact Info Address  ?Wellington Hampshire, MD Consulting Physician (Cardiology) Phone: [phone] Fax: [phone]  [address] [place] [address]  ?  ?  ?Call for follow up appointment for October - due for 6 month follow up ? ? ? ?

## 2021-03-24 NOTE — Progress Notes (Signed)
Name: Brent [name]   MRN: [id number]    DOB: 1945-02-23   Date:03/24/2021       Progress Note  Chief Complaint  Patient presents with   Follow-up   Hypertension   Hyperlipidemia     Subjective:   Brent [name] is a 76 y.o. male, presents to clinic for routine f/up  Pt has hx of NSTEMI 11/2019 with immediate f/up with cardiology and cardiac rehab, but I cannot see any follow up since that time, he was to be on plavix for 1 year and he was put on BB and statin. Last LDL was 50  On lipitor 40 mg - good compliance, no SE or concerns Some leg pain with exertion, denies CP, denies myalgias Lab Results  Component Value Date   CHOL 123 09/23/2020   HDL 59 09/23/2020   LDLCALC 50 09/23/2020   TRIG 48 09/23/2020   CHOLHDL 2.1 09/23/2020   Hyperglycemia/prediabetes in the past, last A1C was in normal range  On plavix and asa  for one year post NSTEMI, was supposed to stop and f/up with cardiology last Nov - about 4 months ago Only currently taking ASA, no bleeding  On BB post NSTEMI Some palpitations but he denies near syncope, CP/chest pressure, exertional SOB, diaphoresis BP Readings from Last 3 Encounters:  03/24/21 124/62  09/23/20 108/64  06/15/20 120/72   Pulse Readings from Last 3 Encounters:  03/24/21 99  09/23/20 93  06/15/20 76   Smoking - only smoking 3-4 cigarettes a week, denies chronic cough or wheeze  Doesn't know why he didn't get appt with cardiology - thinks it was cancelled by them - I cannot find this in chart Was due for appt last Oct    Current Outpatient Medications:    acetaminophen (TYLENOL) 325 MG tablet, Take 650 mg by mouth every 6 (six) hours as needed. , Disp: , Rfl:    albuterol (VENTOLIN HFA) 108 (90 Base) MCG/ACT inhaler, Inhale 2 puffs into the lungs every 6 (six) hours as needed for wheezing or shortness of breath., Disp: 6.7 g, Rfl: 2   aspirin EC 81 MG EC tablet, Take 1 tablet (81 mg total) by mouth daily., Disp: , Rfl:     atorvastatin (LIPITOR) 40 MG tablet, Take 1 tablet (40 mg total) by mouth daily., Disp: 90 tablet, Rfl: 3   clopidogrel (PLAVIX) 75 MG tablet, Take 1 tablet (75 mg total) by mouth daily with breakfast., Disp: 30 tablet, Rfl: 11   gabapentin (NEURONTIN) 100 MG capsule, Take 1 capsule (100 mg total) by mouth daily., Disp: 90 capsule, Rfl: 3   metoprolol succinate (TOPROL-XL) 25 MG 24 hr tablet, Take 0.5 tablets (12.5 mg total) by mouth daily., Disp: 30 tablet, Rfl: 11  Patient Active Problem List   Diagnosis Date Noted   Leg pain, right 06/15/2020   Ventricular tachycardia    Hypotension    NSTEMI (non-ST elevated myocardial infarction) (HCC) 11/30/2019   Current mild episode of major depressive disorder without prior episode (HCC) 10/29/2019   Chronic midline low back pain without sciatica 10/29/2019   Adenomatous polyp of colon 10/29/2019   Polyp of ascending colon    Atherosclerosis of aorta (HCC) 05/23/2017   Osteopenia 08/29/2016   Weakness 04/03/2016   Underweight 03/27/2016   Cigarette smoker one half pack a day or less 09/24/2015   Incidental lung nodule, > 3mm and < 8mm 08/03/2015   Centrilobular emphysema (HCC) 08/03/2015   Right Supraclavicular Lymph node 08/03/2015  Carotid artery calcification 07/29/2015   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   Abnormal chest CT 07/29/2015   Coronary artery calcification seen on CAT scan 07/29/2015   Coronary artery disease involving native coronary artery of native heart with angina pectoris (HCC)    Lymphadenopathy of right cervical region 07/19/2015   Aortic calcification (HCC) 07/10/2015   Benign neoplasm of transverse colon    Benign neoplasm of descending colon    Benign neoplasm of sigmoid colon    Cataract    Tobacco abuse    History of colonic polyps    Hypochloremia    Low sodium levels 08/18/2013   Back pain without radiation 08/18/2013   Hyposmolality and/or hyponatremia 08/18/2013   Guillain Barr syndrome (HCC)  08/11/2013    Past Surgical History:  Procedure Laterality Date   COLONOSCOPY WITH PROPOFOL N/A 09/22/2014   Procedure: COLONOSCOPY WITH PROPOFOL;  Surgeon: Midge Minium, MD;  Location: [facility] ENDOSCOPY;  Service: Endoscopy;  Laterality: N/A;   COLONOSCOPY WITH PROPOFOL N/A 07/09/2018   Procedure: COLONOSCOPY WITH PROPOFOL;  Surgeon: Midge Minium, MD;  [facility] END[facility];  Service: Endoscopy;  Laterality: N/A;   DEEP NECK LYMPH NODE BIOPSY / EXCISION Right    HERNIA REPAIR  1988   LEFT HEART CATH AND CORONARY ANGIOGRAPHY N/A 12/01/2019   Procedure: LEFT HEART CATH AND CORONARY ANGIOGRAPHY;  Surgeon: Iran Ouch, MD;  Location: [facility] INVASIVE CV LAB;  Service: Cardiovascular;  Laterality: N/A;   LYMPH NODE BIOPSY     NM MYOVIEW ([facility] HX)  10/09/2013   Dr. Dorothyann Peng: EF 58%. Normal wall motion. No ischemia or infarction.   shoulder     SHOULDER ARTHROSCOPY Right    TRANSTHORACIC ECHOCARDIOGRAM  09/24/2013   EF 45-50% with mild global hypokinesis. GR 1 DD. No valvular lesions besides mild TR. Normal RV pressures.    Family History  Problem Relation Age of Onset   Alcohol abuse Mother    Healthy Daughter    Parkinson's disease Other    Stroke Other    Diabetes Sister    Heart disease Other    Heart disease Other    Alcohol abuse Brother    Cancer Paternal Grandfather        pancreatic   Heart disease Sister    Heart disease Sister    Healthy Son    Healthy Daughter    Bladder Cancer Neg Hx    Kidney cancer Neg Hx     Social History   Tobacco Use   Smoking status: Some Days    Packs/day: 0.15    Years: 56.00    Pack years: 8.40    Types: Cigarettes    Start date: 1964   Smokeless tobacco: Never   Tobacco comments:    New quit date 02/24/20 - made 02/05/20; currently smoking 3 cigs per week 05/20/20  Vaping Use   Vaping Use: Never used  Substance Use Topics   Alcohol use: No   Drug use: No     Allergies  Allergen Reactions   Influenza Vaccines      GUILLAIN BARRE SYNDROME    Pneumococcal Vaccine Other (See Comments)    GUILLAIN BARRE SYNDROME NUMBNESS, SOB,CHEST PAIN    Prevnar [Pneumococcal 13-Val Conj Vacc] Other (See Comments)    GUILLAIN-BARRE SYNDROME    Health Maintenance  Topic Date Due   Pneumonia Vaccine 28+ Years old (2 - PPSV23 if available, else PCV20) 07/26/2014   COVID-19 Vaccine (1) 04/09/2021 (Originally 03/12/1946)   Zoster Vaccines- Shingrix (  1 of 2) 06/24/2021 (Originally 09/11/1996)   TETANUS/TDAP  03/25/2022 (Originally 01/10/2019)   COLONOSCOPY (Pts 45-16yrs Insurance coverage will need to be confirmed)  07/09/2023   Hepatitis C Screening  Completed   HPV VACCINES  Aged Out   INFLUENZA VACCINE  Discontinued    Chart Review Today: I personally reviewed active problem list, medication list, allergies, family history, social history, health maintenance, notes from last encounter, lab results, imaging with the patient/caregiver today.   Review of Systems  Constitutional: Negative.   HENT: Negative.    Eyes: Negative.   Respiratory: Negative.    Cardiovascular: Negative.   Gastrointestinal: Negative.   Endocrine: Negative.   Genitourinary: Negative.   Musculoskeletal: Negative.   Skin: Negative.   Allergic/Immunologic: Negative.   Neurological: Negative.   Hematological: Negative.   Psychiatric/Behavioral: Negative.    All other systems reviewed and are negative.   Objective:   Vitals:   03/24/21 0956  BP: 124/62  Pulse: 99  Resp: 16  Temp: 97.7 F (36.5 C)  TempSrc: Oral  SpO2: 99%  Weight: 122 lb (55.3 kg)  Height: 5\' 9"  (1.753 m)    Body mass index is 18.02 kg/m.  Physical Exam Vitals and nursing note reviewed.  Constitutional:      General: He is not in acute distress.    Appearance: Normal appearance. He is well-developed, well-groomed and underweight. He is not ill-appearing, toxic-appearing or diaphoretic.  HENT:     Head: Normocephalic and atraumatic.     Jaw: No trismus.      Right Ear: Tympanic membrane, ear canal and external ear normal.     Left Ear: Tympanic membrane, ear canal and external ear normal.     Nose: No mucosal edema.     Right Sinus: No maxillary sinus tenderness or frontal sinus tenderness.     Left Sinus: No maxillary sinus tenderness or frontal sinus tenderness.     Mouth/Throat:     Pharynx: Uvula midline. No uvula swelling.  Eyes:     General: Lids are normal.        Right eye: No discharge.        Left eye: No discharge.     Conjunctiva/sclera: Conjunctivae normal.  Neck:     Trachea: Trachea and phonation normal. No tracheal deviation.  Cardiovascular:     Rate and Rhythm: Normal rate and regular rhythm.     Pulses: Normal pulses.          Radial pulses are 2+ on the right side and 2+ on the left side.       Posterior tibial pulses are 2+ on the right side and 2+ on the left side.     Heart sounds: Normal heart sounds. No murmur heard.   No friction rub. No gallop.  Pulmonary:     Effort: Pulmonary effort is normal. No respiratory distress.     Breath sounds: Normal breath sounds. No wheezing, rhonchi or rales.  Abdominal:     General: Bowel sounds are normal. There is no distension.     Palpations: Abdomen is soft.     Tenderness: There is no abdominal tenderness. There is no guarding or rebound.  Musculoskeletal:        General: Normal range of motion.     Cervical back: Normal range of motion and neck supple.     Right lower leg: No edema.     Left lower leg: No edema.  Skin:    General: Skin is warm and  dry.     Capillary Refill: Capillary refill takes less than 2 seconds.     Coloration: Skin is not jaundiced or pale.     Findings: No bruising or rash.     Comments: Hyperpigmentation to his right lower side  Neurological:     Mental Status: He is alert.     Gait: Gait normal.  Psychiatric:        Mood and Affect: Mood normal.        Speech: Speech normal.        Behavior: Behavior normal. Behavior is cooperative.     Current smoker, but decreased amount, not daily Smoking cessation instruction/counseling given for 5 minutes today:  counseled patient on the dangers of tobacco use, advised patient to stop smoking, and reviewed strategies to maximize success  congratulated on his efforts thus far    Assessment & Plan:     ICD-10-CM   1. Hypercholesteremia  E78.00 COMPLETE METABOLIC PANEL WITH GFR    Lipid panel    Ambulatory referral to Cardiology    US ARTERIAL ABI (SCREENING LOWER EXTREMITY)   reduce statin dose, last LDL was still at goal, will repeat today to ensure <70 with recent major cardiac event and lost to f/up with cardiology    2. Primary hypertension  I10 COMPLETE METABOLIC PANEL WITH GFR    Ambulatory referral to Cardiology    US ARTERIAL ABI (SCREENING LOWER EXTREMITY)   BP stable and at goal, only on low dose BB post MI    3. Guillain Barr syndrome (HCC)  G61.0     4. Centrilobular emphysema (HCC)  J43.2    rarely uses inhaler, some mild DOE, decreased smoking    5. Aortic calcification (HCC)  I70.0 COMPLETE METABOLIC PANEL WITH GFR    Lipid panel    US ARTERIAL ABI (SCREENING LOWER EXTREMITY)   on statin and ASA    6. Hyperglycemia  R73.9 COMPLETE METABOLIC PANEL WITH GFR    CANCELED: Hemoglobin A1c   will recheck glucose, last A1C normal, will recheck that if pt has repeated hyperglycemia again    7. Need for shingles vaccine  Z23 Zoster Vaccine Adjuvanted [facility]) injection    8. Atherosclerosis of aorta (HCC)  I70.0 COMPLETE METABOLIC PANEL WITH GFR    Lipid panel    Ambulatory referral to Cardiology    9. Coronary artery disease involving native coronary artery of native heart with angina pectoris (HCC)  I25.119 COMPLETE METABOLIC PANEL WITH GFR    Lipid panel    Ambulatory referral to Cardiology   on statin, ASA, BB, not on acei/arb - needs cardiology f/up    10. Encounter for monitoring antiplatelet therapy  Z51.81 CBC with Differential/Platelet   Z79.02  COMPLETE METABOLIC PANEL WITH GFR    11. Tobacco abuse  Z72.0 US ARTERIAL ABI (SCREENING LOWER EXTREMITY)   significantly reduced smoking, now only a few times a week    12. Right leg pain  M79.604 US ARTERIAL ABI (SCREENING LOWER EXTREMITY)   ongoing - worse with exertion, cardiology suspected atypical claudication and ordered f/up imaging but it was not completed, screening ABI ordered    13. Medication monitoring encounter  Z51.81 CBC with Differential/Platelet    COMPLETE METABOLIC PANEL WITH GFR    Lipid panel    CANCELED: Hemoglobin A1c    14. Palpitations  R00.2    pt notes occasional palpitations, HR 90's, possible SE with higher BB dose, on toprol XL 12.5 daily (may be able  to increase back up to 25?)    15. Encounter for smoking cessation counseling  Z71.6       Return in about 6 months (around 09/24/2021) for Routine follow-up.   Danelle Berry, PA-C 03/24/21 10:09 AM

## 2021-03-25 LAB — CBC WITH DIFFERENTIAL/PLATELET
Absolute Monocytes: 585 cells/uL (ref 200–950)
Basophils Absolute: 30 cells/uL (ref 0–200)
Basophils Relative: 0.7 %
Eosinophils Absolute: 77 cells/uL (ref 15–500)
Eosinophils Relative: 1.8 %
HCT: 47.8 % (ref 38.5–50.0)
Hemoglobin: 15.5 g/dL (ref 13.2–17.1)
Lymphs Abs: 1290 cells/uL (ref 850–3900)
MCH: 30.5 pg (ref 27.0–33.0)
MCHC: 32.4 g/dL (ref 32.0–36.0)
MCV: 94.1 fL (ref 80.0–100.0)
MPV: 9.8 fL (ref 7.5–12.5)
Monocytes Relative: 13.6 %
Neutro Abs: 2318 cells/uL (ref 1500–7800)
Neutrophils Relative %: 53.9 %
Platelets: 263 10*3/uL (ref 140–400)
RBC: 5.08 10*6/uL (ref 4.20–5.80)
RDW: 11.7 % (ref 11.0–15.0)
Total Lymphocyte: 30 %
WBC: 4.3 10*3/uL (ref 3.8–10.8)

## 2021-03-25 LAB — COMPLETE METABOLIC PANEL WITH GFR
AG Ratio: 1.8 (calc) (ref 1.0–2.5)
ALT: 25 U/L (ref 9–46)
AST: 31 U/L (ref 10–35)
Albumin: 4.8 g/dL (ref 3.6–5.1)
Alkaline phosphatase (APISO): 65 U/L (ref 35–144)
BUN: 12 mg/dL (ref 7–25)
CO2: 29 mmol/L (ref 20–32)
Calcium: 10 mg/dL (ref 8.6–10.3)
Chloride: 100 mmol/L (ref 98–110)
Creat: 0.91 mg/dL (ref 0.70–1.28)
Globulin: 2.7 g/dL (calc) (ref 1.9–3.7)
Glucose, Bld: 92 mg/dL (ref 65–99)
Potassium: 4.9 mmol/L (ref 3.5–5.3)
Sodium: 138 mmol/L (ref 135–146)
Total Bilirubin: 0.5 mg/dL (ref 0.2–1.2)
Total Protein: 7.5 g/dL (ref 6.1–8.1)
eGFR: 88 mL/min/{1.73_m2} (ref >=60)

## 2021-03-25 LAB — LIPID PANEL
Cholesterol: 154 mg/dL (ref <200)
HDL: 55 mg/dL (ref >=40)
LDL Cholesterol (Calc): 82 mg/dL (calc)
Non-HDL Cholesterol (Calc): 99 mg/dL (calc) (ref <130)
Total CHOL/HDL Ratio: 2.8 (calc) (ref <5.0)
Triglycerides: 88 mg/dL (ref <150)

## 2021-03-31 ENCOUNTER — Telehealth: Payer: Self-pay | Admitting: Acute Care

## 2021-03-31 NOTE — Telephone Encounter (Signed)
Attempted to schedule annual LDCT, but call went to VM with message left. ?

## 2021-04-08 ENCOUNTER — Ambulatory Visit
Admission: RE | Admit: 2021-04-08 | Discharge: 2021-04-08 | Disposition: A | Discharge status: Home / Self Care | Payer: Medicare PPO | Source: Ambulatory Visit | Attending: Family Medicine | Admitting: Family Medicine

## 2021-04-08 DIAGNOSIS — I70213 Atherosclerosis of native arteries of extremities with intermittent claudication, bilateral legs: Secondary | ICD-10-CM | POA: Diagnosis not present

## 2021-04-08 DIAGNOSIS — Z720 Tobacco use: Secondary | ICD-10-CM | POA: Diagnosis not present

## 2021-04-08 DIAGNOSIS — I10 Essential (primary) hypertension: Secondary | ICD-10-CM | POA: Diagnosis not present

## 2021-04-08 DIAGNOSIS — I700 Atherosclerosis of aorta: Secondary | ICD-10-CM | POA: Insufficient documentation

## 2021-04-08 DIAGNOSIS — E7800 Pure hypercholesterolemia, unspecified: Secondary | ICD-10-CM | POA: Diagnosis not present

## 2021-04-08 DIAGNOSIS — M79604 Pain in right leg: Secondary | ICD-10-CM | POA: Insufficient documentation

## 2021-04-11 ENCOUNTER — Encounter: Payer: Self-pay | Admitting: Family Medicine

## 2021-04-12 NOTE — Addendum Note (Signed)
Addended by: Delsa Grana on: 04/12/2021 02:44 PM ? ? Modules accepted: Orders ? ?

## 2021-04-26 DIAGNOSIS — Z85818 Personal history of malignant neoplasm of other sites of lip, oral cavity, and pharynx: Secondary | ICD-10-CM | POA: Diagnosis not present

## 2021-05-19 ENCOUNTER — Encounter: Payer: Self-pay | Admitting: Cardiovascular Disease

## 2021-05-19 ENCOUNTER — Ambulatory Visit: Payer: Medicare PPO | Admitting: Cardiovascular Disease

## 2021-05-19 VITALS — BP 126/60 | HR 90 | Ht 69.0 in | Wt 122.4 lb

## 2021-05-19 DIAGNOSIS — I10 Essential (primary) hypertension: Secondary | ICD-10-CM

## 2021-05-19 DIAGNOSIS — E785 Hyperlipidemia, unspecified: Secondary | ICD-10-CM

## 2021-05-19 DIAGNOSIS — I4729 Other ventricular tachycardia: Secondary | ICD-10-CM | POA: Diagnosis not present

## 2021-05-19 DIAGNOSIS — Z720 Tobacco use: Secondary | ICD-10-CM

## 2021-05-19 DIAGNOSIS — I2510 Atherosclerotic heart disease of native coronary artery without angina pectoris: Secondary | ICD-10-CM

## 2021-05-19 NOTE — Patient Instructions (Signed)

## 2021-05-19 NOTE — Progress Notes (Signed)
?  ?Cardiology Office Note ? ? ?Date:  05/19/2021  ? ?ID:  Brent [name], DOB 1945/05/04, MRN [id number] ? ?PCP:  [facility]  ?Cardiologist:   [name], MD  ? ?Chief Complaint  ?Patient presents with  ? Other  ?  6 month f/u c/o right leg numbness. Meds reviewed verbally with pt.  ? ? ?  ?History of Present Illness: ?Brent [name] is a 76 y.o. male who presents for a follow-up visit regarding coronary artery disease. ?He has known history of essential hypertension, hyperlipidemia and Guillain-Barr? syndrome. ?He was hospitalized in November 2021 with non-ST elevation myocardial infarction with peak troponin of 20,000.  Cardiac catheterization showed heavily calcified coronary arteries especially in the proximal segments.  The coronary arteries were left dominant with 90% stenosis in proximal RCA which was relatively small.  In addition, there was 70% stenosis in the mid left circumflex and 70% stenosis in first diagonal.  I recommended medical therapy.  Echocardiogram showed normal LV systolic function with an EF of 55 to 60%. ?He cut down on tobacco use and currently smokes few cigarettes a week. ?He has been doing well with no recent chest pain or worsening dyspnea.  He continues to complain of some numbness in his right leg but his pulses are palpable and arterial Doppler in June 2022 showed normal ABI and no evidence of PAD. ? ?Past Medical History:  ?Diagnosis Date  ? Aortic calcification ([place]) 07/2015  ? Noted on chest CT with contrast  ? Cataract   ? Centrilobular emphysema ([place]) 08/03/2015  ? Noted on chest CT July 2017  ? 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[place]) 07/29/2015  ? Coronary artery calcification seen on CAT scan 07/29/2015  ? Noted on CT scan July 2017  ? 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 follow Prevnar vaccine  ? Guillain Barr? syndrome ([place])   ? History of colonic polyps   ? Hyperlipidemia   ? Hypochloremia   ? Osteopenia 08/29/2016  ? August 2018; next scan August  2020  ? Right Supraclavicular Lymph node 08/03/2015  ? Solitary pulmonary nodule 08/03/2015  ? CT Chest 07/2015 - Subpleural 3 mm left upper lobe pulmonary nodule, probably benign.  ? Tobacco abuse   ? ? ?Past Surgical History:  ?Procedure Laterality Date  ? COLONOSCOPY WITH PROPOFOL N/A 09/22/2014  ? Procedure: COLONOSCOPY WITH PROPOFOL;  Surgeon: Lucilla Lame, MD;  Location: [facility] ENDOSCOPY;  Service: Endoscopy;  Laterality: N/A;  ? COLONOSCOPY WITH PROPOFOL N/A 07/09/2018  ? Procedure: COLONOSCOPY WITH PROPOFOL;  Surgeon: Lucilla Lame, MD;  Location: [facility] ENDOSCOPY;  Service: Endoscopy;  Laterality: N/A;  ? DEEP NECK LYMPH NODE BIOPSY / EXCISION Right   ? HERNIA REPAIR  1988  ? LEFT HEART CATH AND CORONARY ANGIOGRAPHY N/A 12/01/2019  ? Procedure: LEFT HEART CATH AND CORONARY ANGIOGRAPHY;  Surgeon: Wellington Hampshire, MD;  Location: [place] CV LAB;  Service: Cardiovascular;  Laterality: N/A;  ? LYMPH NODE BIOPSY    ? NM MYOVIEW ([place] HX)  10/09/2013  ? Dr. Lujean Amel: EF 58%. Normal wall motion. No ischemia or infarction.  ? shoulder    ? SHOULDER ARTHROSCOPY Right   ? TRANSTHORACIC ECHOCARDIOGRAM  09/24/2013  ? EF 45-50% with mild global hypokinesis. GR 1 DD. No valvular lesions besides mild TR. Normal RV pressures.  ? ? ? ?Current Outpatient Medications  ?Medication Sig Dispense Refill  ? acetaminophen (TYLENOL) 325 MG tablet Take 650 mg by mouth every 6 (six) hours as needed.     ?  albuterol (VENTOLIN HFA) 108 (90 Base) MCG/ACT inhaler Inhale 2 puffs into the lungs every 6 (six) hours as needed for wheezing or shortness of breath. 6.7 g 2  ? aspirin EC 81 MG EC tablet Take 1 tablet (81 mg total) by mouth daily.    ? atorvastatin (LIPITOR) 40 MG tablet Take 1 tablet (40 mg total) by mouth daily. 90 tablet 3  ? gabapentin (NEURONTIN) 100 MG capsule Take 1 capsule (100 mg total) by mouth daily. 90 capsule 3  ? metoprolol succinate (TOPROL-XL) 25 MG 24 hr tablet Take 0.5 tablets (12.5 mg total) by mouth daily.  30 tablet 11  ? ?No current facility-administered medications for this visit.  ? ? ?Allergies:   Influenza vaccines, Pneumococcal vaccine, and Prevnar [pneumococcal 13-val conj vacc]  ? ? ?Social History:  The patient  reports that he has been smoking cigarettes. He started smoking about 59 years ago. He has a 8.40 pack-year smoking history. He has never used smokeless tobacco. He reports that he does not drink alcohol and does not use drugs.  ? ?Family History:  The patient's family history includes Alcohol abuse in his brother and mother; Cancer in his paternal grandfather; Diabetes in his sister; Healthy in his daughter, daughter, and son; Heart disease in his sister, sister and other family members; Parkinson's disease in an other family member; Stroke in an other family member.  ? ? ?ROS:  Please see the history of present illness.   Otherwise, review of systems are positive for none.   All other systems are reviewed and negative.  ? ? ?PHYSICAL EXAM: ?VS:  BP 126/60 (BP Location: Left Arm, Patient Position: Sitting, Cuff Size: Normal)   Pulse 90   Ht '5\' 9"'$  (1.753 m)   Wt 122 lb 6 oz (55.5 kg)   SpO2 98%   BMI 18.07 kg/m?  , BMI Body mass index is 18.07 kg/m?. ?GEN: Well nourished, well developed, in no acute distress  ?HEENT: normal  ?Neck: no JVD, carotid bruits, or masses ?Cardiac: RRR; no murmurs, rubs, or gallops,no edema  ?Respiratory:  clear to auscultation bilaterally, normal work of breathing ?GI: soft, nontender, nondistended, + BS ?MS: no deformity or atrophy  ?Skin: warm and dry, no rash ?Neuro:  Strength and sensation are intact ?Psych: euthymic mood, full affect ? ? ? ?EKG:  EKG is ordered today. ?The ekg ordered today demonstrates normal sinus rhythm with minimal LVH. ? ? ?Recent Labs: ?03/24/2021: ALT 25; BUN 12; Creat 0.91; Hemoglobin 15.5; Platelets 263; Potassium 4.9; Sodium 138  ? ? ?Lipid Panel ?   ?Component Value Date/Time  ? CHOL 154 03/24/2021 1044  ? CHOL 158 07/29/2015 1241  ?  TRIG 88 03/24/2021 1044  ? HDL 55 03/24/2021 1044  ? HDL 47 07/29/2015 1241  ? CHOLHDL 2.8 03/24/2021 1044  ? VLDL 5 12/01/2019 0405  ? [place] 82 03/24/2021 1044  ? ?  ? ?Wt Readings from Last 3 Encounters:  ?05/19/21 122 lb 6 oz (55.5 kg)  ?03/24/21 122 lb (55.3 kg)  ?09/23/20 118 lb 14.4 oz (53.9 kg)  ?  ? ? ? ? ?   ? View : No data to display.  ?  ?  ?  ? ? ? ? ?ASSESSMENT AND PLAN: ? ?1.  Coronary artery disease involving native coronary arteries without angina: He is doing very well with no anginal symptoms.  Continue medical therapy. ? ?2.  Essential hypertension: Blood pressure is well controlled. ? ?3.  Hyperlipidemia: Continue high-dose  atorvastatin.  I reviewed his recent lipid profile which showed an LDL of 80 which is close to target. ? ?4.  Tobacco use: He cut down on tobacco use.  I discussed the importance of complete cessation. ? ?5.  Nonsustained ventricular tachycardia in the setting of myocardial infarction: Continue Toprol.  No recurrent arrhythmia. ? ?6.  Right leg pain: Unlikely to be related to peripheral arterial disease.  Arterial Doppler was normal last year and his pulses are palpable. ? ? ? ?Disposition:   FU with me in 12 months ? ?Signed, ? ?[name], MD  ?05/19/2021 3:17 PM    ?[place] ?

## 2021-05-24 ENCOUNTER — Ambulatory Visit (INDEPENDENT_AMBULATORY_CARE_PROVIDER_SITE_OTHER): Payer: Medicare PPO

## 2021-05-24 ENCOUNTER — Telehealth: Payer: Self-pay

## 2021-05-24 VITALS — BP 134/70 | HR 67 | Temp 98.3°F | Resp 16 | Ht 69.0 in | Wt 121.9 lb

## 2021-05-24 DIAGNOSIS — Z122 Encounter for screening for malignant neoplasm of respiratory organs: Secondary | ICD-10-CM | POA: Diagnosis not present

## 2021-05-24 DIAGNOSIS — Z599 Problem related to housing and economic circumstances, unspecified: Secondary | ICD-10-CM | POA: Diagnosis not present

## 2021-05-24 DIAGNOSIS — Z0000 Encounter for general adult medical examination without abnormal findings: Secondary | ICD-10-CM | POA: Diagnosis not present

## 2021-05-24 DIAGNOSIS — Z5941 Food insecurity: Secondary | ICD-10-CM

## 2021-05-24 NOTE — Patient Instructions (Signed)
Brent [name] , ?Thank you for taking time to come for your Medicare Wellness Visit. I appreciate your ongoing commitment to your health goals. Please review the following plan we discussed and let me know if I can assist you in the future.  ? ?Screening recommendations/referrals: ?Colonoscopy: done 07/09/18. Repeat 06/2023 ?Recommended yearly ophthalmology/optometry visit for glaucoma screening and checkup ?Recommended yearly dental visit for hygiene and checkup ? ?Vaccinations: ?Influenza vaccine: n/a ?Pneumococcal vaccine: n/a ?Tdap vaccine: due ?Shingles vaccine: Shingrix discussed. Please contact your pharmacy for coverage information.  ?Covid-19: declined ? ?Advanced directives: Advance directive discussed with you today. I have provided a copy for you to complete at home and have notarized. Once this is complete please bring a copy in to our office so we can scan it into your chart.  ? ?Conditions/risks identified: recommend drinking 6-8 glasses of water per day  ? ?Next appointment: Follow up in one year for your annual wellness visit.  ? ?Preventive Care 22 Years and Older, Male ?Preventive care refers to lifestyle choices and visits with your health care provider that can promote health and wellness. ?What does preventive care include? ?A yearly physical exam. This is also called an annual well check. ?Dental exams once or twice a year. ?Routine eye exams. Ask your health care provider how often you should have your eyes checked. ?Personal lifestyle choices, including: ?Daily care of your teeth and gums. ?Regular physical activity. ?Eating a healthy diet. ?Avoiding tobacco and drug use. ?Limiting alcohol use. ?Practicing safe sex. ?Taking low doses of aspirin every day. ?Taking vitamin and mineral supplements as recommended by your health care provider. ?What happens during an annual well check? ?The services and screenings done by your health care provider during your annual well check will depend on your  age, overall health, lifestyle risk factors, and family history of disease. ?Counseling  ?Your health care provider may ask you questions about your: ?Alcohol use. ?Tobacco use. ?Drug use. ?Emotional well-being. ?Home and relationship well-being. ?Sexual activity. ?Eating habits. ?History of falls. ?Memory and ability to understand (cognition). ?Work and work Statistician. ?Screening  ?You may have the following tests or measurements: ?Height, weight, and BMI. ?Blood pressure. ?Lipid and cholesterol levels. These may be checked every 5 years, or more frequently if you are over 31 years old. ?Skin check. ?Lung cancer screening. You may have this screening every year starting at age 33 if you have a 30-pack-year history of smoking and currently smoke or have quit within the past 15 years. ?Fecal occult blood test (FOBT) of the stool. You may have this test every year starting at age 4. ?Flexible sigmoidoscopy or colonoscopy. You may have a sigmoidoscopy every 5 years or a colonoscopy every 10 years starting at age 3. ?Prostate cancer screening. Recommendations will vary depending on your family history and other risks. ?Hepatitis C blood test. ?Hepatitis B blood test. ?Sexually transmitted disease (STD) testing. ?Diabetes screening. This is done by checking your blood sugar (glucose) after you have not eaten for a while (fasting). You may have this done every 1-3 years. ?Abdominal aortic aneurysm (AAA) screening. You may need this if you are a current or former smoker. ?Osteoporosis. You may be screened starting at age 31 if you are at high risk. ?Talk with your health care provider about your test results, treatment options, and if necessary, the need for more tests. ?Vaccines  ?Your health care provider may recommend certain vaccines, such as: ?Influenza vaccine. This is recommended every year. ?Tetanus, diphtheria, and  acellular pertussis (Tdap, Td) vaccine. You may need a Td booster every 10 years. ?Zoster  vaccine. You may need this after age 20. ?Pneumococcal 13-valent conjugate (PCV13) vaccine. One dose is recommended after age 73. ?Pneumococcal polysaccharide (PPSV23) vaccine. One dose is recommended after age 36. ?Talk to your health care provider about which screenings and vaccines you need and how often you need them. ?This information is not intended to replace advice given to you by your health care provider. Make sure you discuss any questions you have with your health care provider. ?Document Released: 01/22/2015 Document Revised: 09/15/2015 Document Reviewed: 10/27/2014 ?Elsevier Interactive Patient Education ? 2017 [place]. ? ?Fall Prevention in the Home ?Falls can cause injuries. They can happen to people of all ages. There are many things you can do to make your home safe and to help prevent falls. ?What can I do on the outside of my home? ?Regularly fix the edges of walkways and driveways and fix any cracks. ?Remove anything that might make you trip as you walk through a door, such as a raised step or threshold. ?Trim any bushes or trees on the path to your home. ?Use bright outdoor lighting. ?Clear any walking paths of anything that might make someone trip, such as rocks or tools. ?Regularly check to see if handrails are loose or broken. Make sure that both sides of any steps have handrails. ?Any raised decks and porches should have guardrails on the edges. ?Have any leaves, snow, or ice cleared regularly. ?Use sand or salt on walking paths during winter. ?Clean up any spills in your garage right away. This includes oil or grease spills. ?What can I do in the bathroom? ?Use night lights. ?Install grab bars by the toilet and in the tub and shower. Do not use towel bars as grab bars. ?Use non-skid mats or decals in the tub or shower. ?If you need to sit down in the shower, use a plastic, non-slip stool. ?Keep the floor dry. Clean up any water that spills on the floor as soon as it happens. ?Remove  soap buildup in the tub or shower regularly. ?Attach bath mats securely with double-sided non-slip rug tape. ?Do not have throw rugs and other things on the floor that can make you trip. ?What can I do in the bedroom? ?Use night lights. ?Make sure that you have a light by your bed that is easy to reach. ?Do not use any sheets or blankets that are too big for your bed. They should not hang down onto the floor. ?Have a firm chair that has side arms. You can use this for support while you get dressed. ?Do not have throw rugs and other things on the floor that can make you trip. ?What can I do in the kitchen? ?Clean up any spills right away. ?Avoid walking on wet floors. ?Keep items that you use a lot in easy-to-reach places. ?If you need to reach something above you, use a strong step stool that has a grab bar. ?Keep electrical cords out of the way. ?Do not use floor polish or wax that makes floors slippery. If you must use wax, use non-skid floor wax. ?Do not have throw rugs and other things on the floor that can make you trip. ?What can I do with my stairs? ?Do not leave any items on the stairs. ?Make sure that there are handrails on both sides of the stairs and use them. Fix handrails that are broken or loose. Make sure that  handrails are as long as the stairways. ?Check any carpeting to make sure that it is firmly attached to the stairs. Fix any carpet that is loose or worn. ?Avoid having throw rugs at the top or bottom of the stairs. If you do have throw rugs, attach them to the floor with carpet tape. ?Make sure that you have a light switch at the top of the stairs and the bottom of the stairs. If you do not have them, ask someone to add them for you. ?What else can I do to help prevent falls? ?Wear shoes that: ?Do not have high heels. ?Have rubber bottoms. ?Are comfortable and fit you well. ?Are closed at the toe. Do not wear sandals. ?If you use a stepladder: ?Make sure that it is fully opened. Do not climb a  closed stepladder. ?Make sure that both sides of the stepladder are locked into place. ?Ask someone to hold it for you, if possible. ?Clearly mark and make sure that you can see: ?Any grab bars or handrails.

## 2021-05-24 NOTE — Telephone Encounter (Signed)
? ?  Telephone encounter was:  Successful.  ?05/24/2021 ?Name: Brent [name] MRN: [id number] DOB: 11/09/1945 ? ?Brent [name] is a 76 y.o. year old male who is a primary care patient of Teodora Medici, DO . The community resource team was consulted for assistance with Food Insecurity ? ?Care guide performed the following interventions: Patient provided with information about care guide support team and interviewed to confirm resource needs. CG will send out food resources as patient advised that is the only resource needed at this time. ?Mail enclosed: [place] ? ?Follow Up Plan:  Care guide will follow up with patient by phone over the next few days to ensure mail has been received. ? ?Brent [name] ?Care Guide, Embedded Care Coordination ?[facility]  Care management  ?Brent [name], [place]  ?Main Phone: [phone]  E-mail: [name]  ?Website: [email or web address] ? ? ? ?

## 2021-05-24 NOTE — Progress Notes (Signed)
? ?Subjective:  ? Brent [name] is a 76 y.o. male who presents for Medicare Annual/Subsequent preventive examination. ? ?Review of Systems    ? ?Cardiac Risk Factors include: advanced age (>25mn, >>52women);dyslipidemia;hypertension;male gender;smoking/ tobacco exposure ? ?   ?Objective:  ?  ?Today's Vitals  ? 05/24/21 0824  ?BP: 134/70  ?Pulse: 67  ?Resp: 16  ?Temp: 98.3 ?F (36.8 ?C)  ?TempSrc: Oral  ?SpO2: 99%  ?Weight: 121 lb 14.4 oz (55.3 kg)  ?Height: '5\' 9"'$  (1.753 m)  ? ?Body mass index is 18 kg/m?. ? ? ?  05/24/2021  ?  8:31 AM 05/20/2020  ?  8:26 AM 12/12/2019  ?  2:16 PM 11/30/2019  ? 12:48 PM 05/20/2019  ?  8:23 AM 07/09/2018  ?  8:54 AM 05/09/2018  ? 11:35 AM  ?Advanced Directives  ?Does Patient Have a Medical Advance Directive? No No No No No No No  ?Would patient like information on creating a medical advance directive? Yes (MAU/Ambulatory/Procedural Areas - Information given) Yes (MAU/Ambulatory/Procedural Areas - Information given) No - Patient declined No - Patient declined Yes (MAU/Ambulatory/Procedural Areas - Information given)  Yes (MAU/Ambulatory/Procedural Areas - Information given)  ? ? ?Current Medications (verified) ?Outpatient Encounter Medications as of 05/24/2021  ?Medication Sig  ? acetaminophen (TYLENOL) 325 MG tablet Take 650 mg by mouth every 6 (six) hours as needed.   ? albuterol (VENTOLIN HFA) 108 (90 Base) MCG/ACT inhaler Inhale 2 puffs into the lungs every 6 (six) hours as needed for wheezing or shortness of breath.  ? aspirin EC 81 MG EC tablet Take 1 tablet (81 mg total) by mouth daily.  ? atorvastatin (LIPITOR) 40 MG tablet Take 1 tablet (40 mg total) by mouth daily.  ? gabapentin (NEURONTIN) 100 MG capsule Take 1 capsule (100 mg total) by mouth daily.  ? metoprolol succinate (TOPROL-XL) 25 MG 24 hr tablet Take 0.5 tablets (12.5 mg total) by mouth daily.  ? ?No facility-administered encounter medications on file as of 05/24/2021.  ? ? ?Allergies (verified) ?Influenza vaccines,  Pneumococcal vaccine, and Prevnar [pneumococcal 13-val conj vacc]  ? ?History: ?Past Medical History:  ?Diagnosis Date  ? Aortic calcification ([facility] 07/2015  ? Noted on chest CT with contrast  ? Cataract   ? Centrilobular emphysema ([facility] 08/03/2015  ? Noted on chest CT July 2017  ? 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[facility] 07/29/2015  ? Coronary artery calcification seen on CAT scan 07/29/2015  ? Noted on CT scan July 2017  ? 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 follow Prevnar vaccine  ? Guillain Barr? syndrome ([facility]   ? History of colonic polyps   ? Hyperlipidemia   ? Hypochloremia   ? Osteopenia 08/29/2016  ? August 2018; next scan August 2020  ? Right Supraclavicular Lymph node 08/03/2015  ? Solitary pulmonary nodule 08/03/2015  ? CT Chest 07/2015 - Subpleural 3 mm left upper lobe pulmonary nodule, probably benign.  ? Tobacco abuse   ? ?Past Surgical History:  ?Procedure Laterality Date  ? COLONOSCOPY WITH PROPOFOL N/A 09/22/2014  ? Procedure: COLONOSCOPY WITH PROPOFOL;  Surgeon: DLucilla Lame MD;  Location: [facility] ENDOSCOPY;  Service: Endoscopy;  Laterality: N/A;  ? COLONOSCOPY WITH PROPOFOL N/A 07/09/2018  ? Procedure: COLONOSCOPY WITH PROPOFOL;  Surgeon: WLucilla Lame MD;  [facility];  Service: Endoscopy;  Laterality: N/A;  ? DEEP NECK LYMPH NODE BIOPSY / EXCISION Right   ? HERNIA REPAIR  1988  ? LEFT HEART CATH AND CORONARY ANGIOGRAPHY N/A 12/01/2019  ? Procedure:  LEFT HEART CATH AND CORONARY ANGIOGRAPHY;  Surgeon: Wellington Hampshire, MD;  Location: [place] [facility];  Service: Cardiovascular;  Laterality: N/A;  ? LYMPH NODE BIOPSY    ? NM MYOVIEW ([place] [facility])  10/09/2013  ? Dr. Lujean Amel: EF 58%. Normal wall motion. No ischemia or infarction.  ? shoulder    ? SHOULDER ARTHROSCOPY Right   ? TRANSTHORACIC ECHOCARDIOGRAM  09/24/2013  ? EF 45-50% with mild global hypokinesis. GR 1 DD. No valvular lesions besides mild TR. Normal RV pressures.  ? ?Family History  ?Problem Relation Age of Onset  ? Alcohol abuse  Mother   ? Healthy Daughter   ? Parkinson's disease Other   ? Stroke Other   ? Diabetes Sister   ? Heart disease Other   ? Heart disease Other   ? Alcohol abuse Brother   ? Cancer Paternal Grandfather   ?     pancreatic  ? Heart disease Sister   ? Heart disease Sister   ? Healthy Son   ? Healthy Daughter   ? Bladder Cancer Neg Hx   ? Kidney cancer Neg Hx   ? ?Social History  ? ?Socioeconomic History  ? Marital status: Married  ?  Spouse name: Brent [name]  ? Number of children: 3  ? Years of education: some college  ? Highest education level: 12th grade  ?Occupational History  ? Occupation: Retired  ?Tobacco Use  ? Smoking status: Some Days  ?  Packs/day: 0.15  ?  Years: 56.00  ?  Pack years: 8.40  ?  Types: Cigarettes  ?  Start date: 1964  ? Smokeless tobacco: Never  ? Tobacco comments:  ?  New quit date 02/24/20 - made 02/05/20; currently smoking 3 cigs per week 05/20/20  ?Vaping Use  ? Vaping Use: Never used  ?Substance and Sexual Activity  ? Alcohol use: No  ? Drug use: No  ? Sexual activity: Not Currently  ?  Partners: Female  ?Other Topics Concern  ? Not on file  ?Social History Narrative  ? Current tobacco user,3 cigarettes per week, previously 2 packs per day for 52 years, formerly worked as a Administrator, has dogs at home.  ? ?Social Determinants of Health  ? ?Financial Resource Strain: Low Risk   ? Difficulty of Paying Living Expenses: Not very hard  ?Food Insecurity: Food Insecurity Present  ? Worried About Charity fundraiser in the Last Year: Sometimes true  ? Ran Out of Food in the Last Year: Never true  ?Transportation Needs: No Transportation Needs  ? Lack of Transportation (Medical): No  ? Lack of Transportation (Non-Medical): No  ?Physical Activity: Insufficiently Active  ? Days of Exercise per Week: 4 days  ? Minutes of Exercise per Session: 30 min  ?Stress: No Stress Concern Present  ? Feeling of Stress : Only a little  ?Social Connections: Socially Integrated  ? Frequency of Communication with Friends  and Family: More than three times a week  ? Frequency of Social Gatherings with Friends and Family: Twice a week  ? Attends Religious Services: More than 4 times per year  ? Active Member of Clubs or Organizations: Yes  ? Attends Archivist Meetings: More than 4 times per year  ? Marital Status: Married  ? ? ?Tobacco Counseling ?Ready to quit: Not Answered ?Counseling given: Not Answered ?Tobacco comments: New quit date 02/24/20 - made 02/05/20; currently smoking 3 cigs per week 05/20/20 ? ? ?Clinical Intake: ? ?Pre-visit preparation  completed: Yes ? ?Pain : No/denies pain ? ?  ? ?BMI - recorded: 18 ?Nutritional Status: BMI <19  Underweight ?Nutritional Risks: None ?Diabetes: No ? ?How often do you need to have someone help you when you read instructions, pamphlets, or other written materials from your doctor or pharmacy?: 3 - Sometimes ? ? ? ?Interpreter Needed?: No ? ?Information entered by :: [name] LPN ? ? ?Activities of Daily Living ? ?  05/24/2021  ?  8:32 AM 05/22/2021  ?  8:14 PM  ?In your present state of health, do you have any difficulty performing the following activities:  ?Hearing? 0 0  ?Vision? 0 1  ?Difficulty concentrating or making decisions? 0 0  ?Walking or climbing stairs? 0 0  ?Dressing or bathing? 0 0  ?Doing errands, shopping? 0 0  ?Preparing Food and eating ? N N  ?Using the Toilet? N N  ?In the past six months, have you accidently leaked urine? N N  ?Do you have problems with loss of bowel control? N N  ?Managing your Medications? N N  ?Managing your Finances? N N  ?Housekeeping or managing your Housekeeping? N   ? ? ?Patient Care Team: ?Teodora Medici, DO as PCP - General (Internal Medicine) ?Wellington Hampshire, MD as Consulting Physician (Cardiology) ?[place], [place], [place] as Education officer, museum ? ?Indicate any recent Medical Services you may have received from other than [facility] providers in the past year (date may be approximate). ? ?   ?Assessment:  ? This is a routine wellness  examination for [place]. ? ?Hearing/Vision screen ?Hearing Screening - Comments::  Pt denies hearing difficulty ?Vision Screening - Comments:: Annual vision screenings with [facility]; du

## 2021-06-02 ENCOUNTER — Telehealth: Payer: Self-pay

## 2021-06-02 ENCOUNTER — Ambulatory Visit (INDEPENDENT_AMBULATORY_CARE_PROVIDER_SITE_OTHER): Payer: Medicare PPO | Admitting: Nurse Practitioner

## 2021-06-02 ENCOUNTER — Encounter (INDEPENDENT_AMBULATORY_CARE_PROVIDER_SITE_OTHER): Payer: Self-pay | Admitting: Nurse Practitioner

## 2021-06-02 VITALS — BP 133/78 | HR 75 | Resp 17 | Ht 69.5 in | Wt 122.0 lb

## 2021-06-02 DIAGNOSIS — I739 Peripheral vascular disease, unspecified: Secondary | ICD-10-CM

## 2021-06-02 DIAGNOSIS — Z720 Tobacco use: Secondary | ICD-10-CM | POA: Diagnosis not present

## 2021-06-02 NOTE — Telephone Encounter (Signed)
   Telephone encounter was:  Successful.  06/02/2021 Name: Brent [name] MRN: [id number] DOB: 10-17-1945  [name] is a 76 y.o. year old male who is a primary care patient of Teodora Medici, DO . The community resource team was consulted for assistance with [place] guide performed the following interventions: Follow up call placed to the patient to discuss status of referral. Patient advised Food resources have been received and pt advised at this time, he does not have any further questions/concerns or needs.  Follow Up Plan:  No further follow up planned at this time. The patient has been provided with needed resources.  [place] management  [place], [place]  Main Phone: [phone]  E-mail: [email or web address]  Website: [email or web address]

## 2021-06-12 ENCOUNTER — Encounter (INDEPENDENT_AMBULATORY_CARE_PROVIDER_SITE_OTHER): Payer: Self-pay | Admitting: Nurse Practitioner

## 2021-06-12 NOTE — Progress Notes (Signed)
Subjective:    Patient ID: Brent [name], male    DOB: June 17, 1945, 76 y.o.   MRN: [id number] Chief Complaint  Patient presents with   Establish Care    Referred by Dr Rickard Patience is a 76 year old male who presents today as a referral from Delsa Grana, PA-C with concern for possible peripheral arterial disease.  The patient had noninvasive studies in 2022 and his ABIs were normal at that time.  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  He denies worsening sensation of the pain with exercise or activity.  His most recent ABIs show right ABI of 1.02 with a left to 0.89.  Monophasic waveforms are noted at the distal tibial arteries in the right lower extremity with triphasic/monophasic waveforms in the left.   Review of Systems  All other systems reviewed and are negative.     Objective:   Physical Exam Vitals reviewed.  HENT:     Head: Normocephalic.  Cardiovascular:     Rate and Rhythm: Normal rate.     Pulses:          Dorsalis pedis pulses are 1+ on the right side and 1+ on the left side.       Posterior tibial pulses are 1+ on the right side and 1+ on the left side.  Pulmonary:     Effort: Pulmonary effort is normal.  Skin:    General: Skin is warm and dry.  Neurological:     Mental Status: He is alert and oriented to person, place, and time.  Psychiatric:        Mood and Affect: Mood normal.        Behavior: Behavior normal.        Thought Content: Thought content normal.        Judgment: Judgment normal.    BP 133/78 (BP Location: Left Arm)   Pulse 75   Resp 17   Ht 5' 9.5" (1.765 m)   Wt 122 lb (55.3 kg)   BMI 17.76 kg/m   Past Medical History:  Diagnosis Date   Aortic calcification (HCC) 07/2015   Noted on chest CT with contrast   Cataract    Centrilobular emphysema ([place]) 08/03/2015   Noted on chest CT July 2017   Cerebellar infarction  ([place]) 07/29/2015   Coronary artery calcification seen on CAT scan 07/29/2015   Noted on CT scan July 2017   Guillain Barr syndrome [facility]) July 2015   follow Prevnar vaccine   Guillain Barr syndrome [facility])    History of colonic polyps    Hyperlipidemia    Hypochloremia    Osteopenia 08/29/2016   August 2018; next scan August 2020   Right Supraclavicular Lymph node 08/03/2015   Solitary pulmonary nodule 08/03/2015   CT Chest 07/2015 - Subpleural 3 mm left upper lobe pulmonary nodule, probably benign.   Tobacco abuse     Social History   Socioeconomic History   Marital status: Married    Spouse name: [name]   Number of children: 3   Years of education: some college   Highest education level: 12th grade  Occupational History   Occupation: Retired  Tobacco Use   Smoking status: Some Days    Packs/day: 0.15    Years: 56.00    Pack years: 8.40    Types: Cigarettes    Start date: 1964  Smokeless tobacco: Never   Tobacco comments:    New quit date 02/24/20 - made 02/05/20; currently smoking 3 cigs per week 05/20/20  Vaping Use   Vaping Use: Never used  Substance and Sexual Activity   Alcohol use: No   Drug use: No   Sexual activity: Not Currently    Partners: Female  Other Topics Concern   Not on file  Social History Narrative   Current tobacco user,3 cigarettes per week, previously 2 packs per day for 64 years, formerly worked as a Administrator, has dogs at home.   Social Determinants of Health   Financial Resource Strain: Low Risk    Difficulty of Paying Living Expenses: Not very hard  Food Insecurity: Food Insecurity Present   Worried About Running Out of Food in the Last Year: Sometimes true   Ran Out of Food in the Last Year: Never true  Transportation Needs: No Transportation Needs   Lack of Transportation (Medical): No   Lack of Transportation (Non-Medical): No  Physical Activity: Insufficiently Active   Days of Exercise per Week: 4 days   Minutes of Exercise per  Session: 30 min  Stress: No Stress Concern Present   Feeling of Stress : Only a little  Social Connections: Engineer, building services of Communication with Friends and Family: More than three times a week   Frequency of Social Gatherings with Friends and Family: Twice a week   Attends Religious Services: More than 4 times per year   Active Member of Clubs or Organizations: Yes   Attends Music therapist: More than 4 times per year   Marital Status: Married  Human resources officer Violence: Not At Risk   Fear of Current or Ex-Partner: No   Emotionally Abused: No   Physically Abused: No   Sexually Abused: No    Past Surgical History:  Procedure Laterality Date   COLONOSCOPY WITH PROPOFOL N/A 09/22/2014   Procedure: COLONOSCOPY WITH PROPOFOL;  Surgeon: Lucilla Lame, MD;  Location: [facility] ENDOSCOPY;  Service: Endoscopy;  Laterality: N/A;   COLONOSCOPY WITH PROPOFOL N/A 07/09/2018   Procedure: COLONOSCOPY WITH PROPOFOL;  Surgeon: Lucilla Lame, MD;  Location: [facility] ENDOSCOPY;  Service: Endoscopy;  Laterality: N/A;   DEEP NECK LYMPH NODE BIOPSY / EXCISION Right    HERNIA REPAIR  1988   LEFT HEART CATH AND CORONARY ANGIOGRAPHY N/A 12/01/2019   Procedure: LEFT HEART CATH AND CORONARY ANGIOGRAPHY;  Surgeon: Wellington Hampshire, MD;  Location: [facility];  Service: Cardiovascular;  Laterality: N/A;   LYMPH NODE BIOPSY     NM MYOVIEW ([place] [facility])  10/09/2013   Dr. Lujean Amel: EF 58%. Normal wall motion. No ischemia or infarction.   shoulder     SHOULDER ARTHROSCOPY Right    TRANSTHORACIC ECHOCARDIOGRAM  09/24/2013   EF 45-50% with mild global hypokinesis. GR 1 DD. No valvular lesions besides mild TR. Normal RV pressures.    Family History  Problem Relation Age of Onset   Alcohol abuse Mother    Healthy Daughter    Parkinson's disease Other    Stroke Other    Diabetes Sister    Heart disease Other    Heart disease Other    Alcohol abuse Brother    Cancer Paternal  Grandfather        pancreatic   Heart disease Sister    Heart disease Sister    Healthy Son    Healthy Daughter    Bladder Cancer Neg Hx  Kidney cancer Neg Hx     Allergies  Allergen Reactions   Influenza Vaccines     GUILLAIN BARRE SYNDROME    Pneumococcal Vaccine Other (See Comments)    GUILLAIN BARRE SYNDROME NUMBNESS, SOB,CHEST PAIN    Prevnar [Pneumococcal 13-Val Conj Vacc] Other (See Comments)    GUILLAIN-BARRE SYNDROME       Latest Ref Rng & Units 03/24/2021   10:44 AM 06/15/2020    8:54 AM 12/03/2019    4:37 AM  CBC  WBC 3.8 - 10.8 Thousand/uL 4.3   4.1   8.7    Hemoglobin 13.2 - 17.1 g/dL 15.5   15.3   12.6    Hematocrit 38.5 - 50.0 % 47.8   46.2   37.2    Platelets 140 - 400 Thousand/uL 263   273   191        CMP     Component Value Date/Time   NA 138 03/24/2021 1044   NA 133 (L) 08/07/2013 0401   K 4.9 03/24/2021 1044   K 3.9 08/07/2013 0401   CL 100 03/24/2021 1044   CL 98 08/07/2013 0401   CO2 29 03/24/2021 1044   CO2 28 08/07/2013 0401   GLUCOSE 92 03/24/2021 1044   GLUCOSE 90 08/07/2013 0401   BUN 12 03/24/2021 1044   BUN 14 08/07/2013 0401   CREATININE 0.91 03/24/2021 1044   CALCIUM 10.0 03/24/2021 1044   CALCIUM 9.0 08/07/2013 0401   PROT 7.5 03/24/2021 1044   PROT 7.7 08/04/2013 1203   ALBUMIN 4.5 03/27/2016 1155   ALBUMIN 3.8 08/04/2013 1203   AST 31 03/24/2021 1044   AST 38 (H) 08/04/2013 1203   ALT 25 03/24/2021 1044   ALT 33 08/04/2013 1203   ALKPHOS 64 03/27/2016 1155   ALKPHOS 77 08/04/2013 1203   BILITOT 0.5 03/24/2021 1044   BILITOT 0.5 08/04/2013 1203   GFRNONAA >60 12/03/2019 0437   GFRNONAA 89 10/29/2019 0903   GFRAA 103 10/29/2019 0903     No results found.     Assessment & Plan:   1. PAD (peripheral artery disease) ([place]) The patient does have evidence of slight PAD with most recent noninvasive studies.  The numbness in the patient's right lower extremity is not consistent with PAD, and likely related to  neurological causes.  No intervention indicated.  Patient is advised to continue with exercise.  He is advised to continue with antiplatelet therapy as well as statin.  The patient return for 6 months for noninvasive studies.  2. Tobacco abuse Smoking cessation was discussed, 3-10 minutes spent on this topic specifically    Current Outpatient Medications on File Prior to Visit  Medication Sig Dispense Refill   acetaminophen (TYLENOL) 325 MG tablet Take 650 mg by mouth every 6 (six) hours as needed.      albuterol (VENTOLIN HFA) 108 (90 Base) MCG/ACT inhaler Inhale 2 puffs into the lungs every 6 (six) hours as needed for wheezing or shortness of breath. 6.7 g 2   aspirin EC 81 MG EC tablet Take 1 tablet (81 mg total) by mouth daily.     atorvastatin (LIPITOR) 40 MG tablet Take 1 tablet (40 mg total) by mouth daily. 90 tablet 3   gabapentin (NEURONTIN) 100 MG capsule Take 1 capsule (100 mg total) by mouth daily. 90 capsule 3   metoprolol succinate (TOPROL-XL) 25 MG 24 hr tablet Take 0.5 tablets (12.5 mg total) by mouth daily. 30 tablet 11   No current facility-administered medications  on file prior to visit.    There are no Patient Instructions on file for this visit. No follow-ups on file.   Kris Hartmann, NP

## 2021-06-29 ENCOUNTER — Emergency Department: Payer: Medicare PPO

## 2021-06-29 ENCOUNTER — Other Ambulatory Visit: Payer: Self-pay

## 2021-06-29 ENCOUNTER — Emergency Department
Admission: EM | Admit: 2021-06-29 | Discharge: 2021-06-29 | Disposition: A | Discharge status: Home / Self Care | Payer: Medicare PPO | Attending: Emergency Medicine | Admitting: Emergency Medicine

## 2021-06-29 DIAGNOSIS — U071 COVID-19: Secondary | ICD-10-CM | POA: Diagnosis not present

## 2021-06-29 DIAGNOSIS — J449 Chronic obstructive pulmonary disease, unspecified: Secondary | ICD-10-CM | POA: Diagnosis not present

## 2021-06-29 DIAGNOSIS — I2510 Atherosclerotic heart disease of native coronary artery without angina pectoris: Secondary | ICD-10-CM | POA: Insufficient documentation

## 2021-06-29 DIAGNOSIS — R079 Chest pain, unspecified: Secondary | ICD-10-CM | POA: Diagnosis not present

## 2021-06-29 DIAGNOSIS — R0789 Other chest pain: Secondary | ICD-10-CM | POA: Diagnosis not present

## 2021-06-29 DIAGNOSIS — R0602 Shortness of breath: Secondary | ICD-10-CM | POA: Diagnosis not present

## 2021-06-29 DIAGNOSIS — Z28310 Unvaccinated for covid-19: Secondary | ICD-10-CM | POA: Diagnosis not present

## 2021-06-29 LAB — BASIC METABOLIC PANEL
Anion gap: 7 (ref 5–15)
BUN: 11 mg/dL (ref 8–23)
CO2: 26 mmol/L (ref 22–32)
Calcium: 8.9 mg/dL (ref 8.9–10.3)
Chloride: 102 mmol/L (ref 98–111)
Creatinine, Ser: 0.79 mg/dL (ref 0.61–1.24)
GFR, Estimated: >60 mL/min (ref >60)
Glucose, Bld: 115 mg/dL — ABNORMAL HIGH (ref 70–99)
Potassium: 3.7 mmol/L (ref 3.5–5.1)
Sodium: 135 mmol/L (ref 135–145)

## 2021-06-29 LAB — CBC
HCT: 40.1 % (ref 39.0–52.0)
Hemoglobin: 12.8 g/dL — ABNORMAL LOW (ref 13.0–17.0)
MCH: 30.8 pg (ref 26.0–34.0)
MCHC: 31.9 g/dL (ref 30.0–36.0)
MCV: 96.4 fL (ref 80.0–100.0)
Platelets: 171 10*3/uL (ref 150–400)
RBC: 4.16 MIL/uL — ABNORMAL LOW (ref 4.22–5.81)
RDW: 12.8 % (ref 11.5–15.5)
WBC: 3.6 10*3/uL — ABNORMAL LOW (ref 4.0–10.5)
nRBC: 0 % (ref 0.0–0.2)

## 2021-06-29 LAB — SARS CORONAVIRUS 2 BY RT PCR: SARS Coronavirus 2 by RT PCR: POSITIVE — AB

## 2021-06-29 LAB — TROPONIN I (HIGH SENSITIVITY)
Troponin I (High Sensitivity): 9 ng/L (ref <18)
Troponin I (High Sensitivity): 10 ng/L (ref <18)

## 2021-06-29 MED ORDER — NIRMATRELVIR/RITONAVIR (PAXLOVID)TABLET: 3.0000 | ORAL_TABLET | Freq: Two times a day (BID) | ORAL | 0 refills | Status: AC

## 2021-06-29 NOTE — ED Provider Notes (Signed)
With  [facility] Provider Note    Event Date/Time   First MD Initiated Contact with Patient 06/29/21 1359     (approximate)   History   Chief Complaint Chest Pain and Shortness of Breath   HPI  Brent [name] is a 76 y.o. male with past medical history of hyperlipidemia, CAD, VT, stroke, COPD, and Guillain-Barr syndrome who presents to the ED complaining of shortness of breath and chest pain.  Patient reports that he has been dealing with intermittent pain in his chest, mild difficulty breathing, cough, and body aches for the past 2 days.  He has not noticed any fevers or chills at home, but does state they told him his temperature was 100.2 in triage.  He describes the pain in his chest as sharp, not exacerbated or alleviated by by anything, and not present currently.  He will occasionally get short of breath with exertion but denies any difficulty breathing currently.  He does state he has had a cough productive of whitish sputum with diffuse body aches.  He is not aware of any sick contacts.  He has not had any abdominal pain, nausea, vomiting, diarrhea, or dysuria.  He has not received the COVID-19 vaccine due to prior Guillain-Barr associated with Pneumovax.     Physical Exam   Triage Vital Signs: ED Triage Vitals [06/29/21 1216]  Enc Vitals Group     BP 130/69     Pulse Rate 88     Resp 18     Temp 100.2 F (37.9 C)     Temp Source Oral     SpO2 95 %     Weight 127 lb (57.6 kg)     Height '5\' 9"'$  (1.753 m)     Head Circumference      Peak Flow      Pain Score 10     Pain Loc      Pain Edu?      Excl. in [place]?     Most recent vital signs: Vitals:   06/29/21 1216 06/29/21 1616  BP: 130/69 (!) 146/70  Pulse: 88 76  Resp: 18 16  Temp: 100.2 F (37.9 C)   SpO2: 95% 100%    Constitutional: Alert and oriented. Eyes: Conjunctivae are normal. Head: Atraumatic. Nose: No congestion/rhinnorhea. Mouth/Throat: Mucous membranes are moist.   Cardiovascular: Normal rate, regular rhythm. Grossly normal heart sounds.  2+ radial pulses bilaterally. Respiratory: Normal respiratory effort.  No retractions. Lungs CTAB. Gastrointestinal: Soft and nontender. No distention. Musculoskeletal: No lower extremity tenderness nor edema.  Neurologic:  Normal speech and language. No gross focal neurologic deficits are appreciated.    ED Results / Procedures / Treatments   Labs (all labs ordered are listed, but only abnormal results are displayed) Labs Reviewed  SARS CORONAVIRUS 2 BY RT PCR - Abnormal; Notable for the following components:      Result Value   SARS Coronavirus 2 by RT PCR POSITIVE (*)    All other components within normal limits  BASIC METABOLIC PANEL - Abnormal; Notable for the following components:   Glucose, Bld 115 (*)    All other components within normal limits  CBC - Abnormal; Notable for the following components:   WBC 3.6 (*)    RBC 4.16 (*)    Hemoglobin 12.8 (*)    All other components within normal limits  TROPONIN I (HIGH SENSITIVITY)  TROPONIN I (HIGH SENSITIVITY)     EKG  ED ECG REPORT I, Juanda Crumble  Jamaria Amborn, the attending physician, personally viewed and interpreted this ECG.   Date: 06/29/2021  EKG Time: 12:06  Rate: 96  Rhythm: normal sinus rhythm  Axis: Normal  Intervals:none  ST&T Change: LVH  RADIOLOGY CXR reviewed and interpreted by me with no infiltrate, edema, or effusion.  PROCEDURES:  Critical Care performed: No  Procedures   MEDICATIONS ORDERED IN ED: Medications - No data to display   IMPRESSION / MDM / [place] / ED COURSE  I reviewed the triage vital signs and the nursing notes.                              76 y.o. male with past medical history of hyperlipidemia, CAD, VT, stroke, Guillain-Barr syndrome, and COPD who presents to the ED with intermittent chest pain and shortness of breath with diffuse body aches and malaise for the past 3 days.  Patient's  presentation is most consistent with acute presentation with potential threat to life or bodily function.  Differential diagnosis includes, but is not limited to, ACS, PE, pneumonia, dissection, pneumothorax, GERD, musculoskeletal pain, viral syndrome.  Patient well-appearing and in no acute distress, vital signs remarkable for borderline fever but otherwise reassuring and do not appear consistent with sepsis.  Chest x-ray is unremarkable with no signs of pneumonia, patient did test positive for COVID-19 which is likely the explanation for his symptoms.  Given no chest pain or shortness of breath currently, low suspicion for PE or dissection.  Initial troponin is within normal limits and given his intermittent symptoms, we will trend.  Additional labs are reassuring with no significant anemia, leukocytosis, electrolyte abnormality, or AKI.  He is breathing comfortably on room air with O2 sats of 95%, no wheezing noted.  Repeat troponin within normal limits and patient continues to breathe comfortably on room air chest pain at this time.  Symptoms seem likely secondary to COVID-19 and he is appropriate for discharge home with outpatient PCP follow-up.  We will start him on Paxlovid and he was counseled to return to the ED for new worsening symptoms, patient agrees with plan.      FINAL CLINICAL IMPRESSION(S) / ED DIAGNOSES   Final diagnoses:  Atypical chest pain  COVID-19     Rx / DC Orders   ED Discharge Orders          Ordered    nirmatrelvir/ritonavir EUA (PAXLOVID) 20 x 150 MG & 10 x '100MG'$  TABS  2 times daily        06/29/21 1628             Note:  This document was prepared using Dragon voice recognition software and may include unintentional dictation errors.   [name], MD 06/29/21 [phone]

## 2021-06-29 NOTE — ED Triage Notes (Signed)
Pt here with CP and SOB since yesterday. Pt also having body aches. Pt states pain is right sided and radiates to his arm, neck, and back. Pt denies N/V/D. Pt had a MI last Nov.

## 2021-06-29 NOTE — ED Triage Notes (Signed)
Sob and chest pain since 730am.

## 2021-06-30 ENCOUNTER — Ambulatory Visit: Payer: Self-pay

## 2021-06-30 NOTE — Telephone Encounter (Signed)
  Chief Complaint: Medication question Symptoms: Mild COVID Frequency: Yesterday Pertinent Negatives: Patient denies SOB now - feeling better Disposition: '[]'$ ED /'[]'$ Urgent Care (no appt availability in office) / '[]'$ Appointment(In office/virtual)/ '[]'$  [place] [facility]/ '[]'$ Home Care/ '[]'$ Refused Recommended Disposition /'[]'$ [place] [facility]/ '[x]'$  Follow-up with PCP Additional Notes: Pt was seen at Ed yesterday for SOB. PT COVID +. PT unsure if he should take paxlovid.  Reason for Disposition  Caller has medicine question, adult has minor symptoms, caller declines triage, AND triager answers question  Answer Assessment - Initial Assessment Questions 1. NAME of MEDICATION: "What medicine are you calling about?"     Paxlovid 2. QUESTION: "What is your question?" (e.g., double dose of medicine, side effect)     Should he take it? 3. PRESCRIBING HCP: "Who prescribed it?" Reason: if prescribed by specialist, call should be referred to that group.     ED 4. SYMPTOMS: "Do you have any symptoms?"     COVID + 5. SEVERITY: If symptoms are present, ask "Are they mild, moderate or severe?"     Mild 6. PREGNANCY:  "Is there any chance that you are pregnant?" "When was your last menstrual period?"     na  Protocols used: Medication Question Call-A-AH

## 2021-07-04 ENCOUNTER — Ambulatory Visit: Payer: Medicare PPO | Admitting: Internal Medicine

## 2021-07-04 ENCOUNTER — Encounter: Payer: Self-pay | Admitting: Internal Medicine

## 2021-07-04 VITALS — BP 109/58 | HR 94 | Temp 98.0°F | Resp 18 | Ht 69.0 in | Wt 120.9 lb

## 2021-07-04 DIAGNOSIS — U071 COVID-19: Secondary | ICD-10-CM | POA: Diagnosis not present

## 2021-07-04 DIAGNOSIS — R031 Nonspecific low blood-pressure reading: Secondary | ICD-10-CM

## 2021-07-20 ENCOUNTER — Other Ambulatory Visit: Payer: Self-pay

## 2021-07-20 DIAGNOSIS — F17210 Nicotine dependence, cigarettes, uncomplicated: Secondary | ICD-10-CM

## 2021-07-20 DIAGNOSIS — Z87891 Personal history of nicotine dependence: Secondary | ICD-10-CM

## 2021-07-20 DIAGNOSIS — Z122 Encounter for screening for malignant neoplasm of respiratory organs: Secondary | ICD-10-CM

## 2021-08-02 ENCOUNTER — Ambulatory Visit
Admission: RE | Admit: 2021-08-02 | Discharge: 2021-08-02 | Disposition: A | Discharge status: Home / Self Care | Payer: Medicare PPO | Source: Ambulatory Visit | Attending: Acute Care | Admitting: Acute Care

## 2021-08-02 DIAGNOSIS — Z87891 Personal history of nicotine dependence: Secondary | ICD-10-CM | POA: Insufficient documentation

## 2021-08-02 DIAGNOSIS — F17210 Nicotine dependence, cigarettes, uncomplicated: Secondary | ICD-10-CM | POA: Insufficient documentation

## 2021-08-02 DIAGNOSIS — Z122 Encounter for screening for malignant neoplasm of respiratory organs: Secondary | ICD-10-CM | POA: Diagnosis not present

## 2021-08-04 ENCOUNTER — Other Ambulatory Visit: Payer: Self-pay | Admitting: Acute Care

## 2021-08-04 DIAGNOSIS — F17210 Nicotine dependence, cigarettes, uncomplicated: Secondary | ICD-10-CM

## 2021-08-04 DIAGNOSIS — Z87891 Personal history of nicotine dependence: Secondary | ICD-10-CM

## 2021-08-04 DIAGNOSIS — Z122 Encounter for screening for malignant neoplasm of respiratory organs: Secondary | ICD-10-CM

## 2021-09-28 ENCOUNTER — Encounter: Payer: Self-pay | Admitting: Internal Medicine

## 2021-09-28 ENCOUNTER — Ambulatory Visit: Payer: Medicare PPO | Admitting: Internal Medicine

## 2021-09-28 VITALS — BP 138/79 | HR 85 | Temp 97.8°F | Resp 18 | Ht 69.0 in | Wt 118.7 lb

## 2021-09-28 DIAGNOSIS — Z87898 Personal history of other specified conditions: Secondary | ICD-10-CM

## 2021-09-28 DIAGNOSIS — I10 Essential (primary) hypertension: Secondary | ICD-10-CM | POA: Diagnosis not present

## 2021-09-28 DIAGNOSIS — J432 Centrilobular emphysema: Secondary | ICD-10-CM | POA: Diagnosis not present

## 2021-09-28 DIAGNOSIS — E7800 Pure hypercholesterolemia, unspecified: Secondary | ICD-10-CM | POA: Diagnosis not present

## 2021-09-28 LAB — POCT GLYCOSYLATED HEMOGLOBIN (HGB A1C): Hemoglobin A1C: 5.1 % (ref 4.0–5.6)

## 2021-09-28 MED ORDER — METOPROLOL SUCCINATE ER 25 MG PO TB24: 12.5000 mg | ORAL_TABLET | Freq: Every day | ORAL | 3 refills | Status: DC

## 2021-09-28 MED ORDER — ATORVASTATIN CALCIUM 40 MG PO TABS: 40.0000 mg | ORAL_TABLET | Freq: Every day | ORAL | 3 refills | Status: DC

## 2021-09-28 MED ORDER — ALBUTEROL SULFATE HFA 108 (90 BASE) MCG/ACT IN AERS: 2.0000 | INHALATION_SPRAY | Freq: Four times a day (QID) | RESPIRATORY_TRACT | 2 refills | Status: AC | PRN

## 2021-09-28 NOTE — Patient Instructions (Addendum)
It was great seeing you today!  Plan discussed at today's visit: -A1c today -Medications refilled -Continue to check blood pressure at home, let me know if it is consistently over 140/90 -Plan for fasting labs next time   Follow up in: 6 months   Take care and let us know if you have any questions or concerns prior to your next visit.  Dr. Rosana Berger

## 2021-09-28 NOTE — Progress Notes (Signed)
Established Patient Office Visit  Subjective   Patient ID: Brent [name], male    DOB: 1945/09/28  Age: 76 y.o. MRN: [id number]  Chief Complaint  Patient presents with   Follow-up   Hypertension   Hyperlipidemia    HPI  Patient is here for follow up on chronic medical conditions.   HTN/Hx of NSTEMI/CAD/HLD: -NSTEMI 11/2019 -Medications: Metoprolol 12.5 mg, Lipitor 40 mg, aspirin -Patient is compliant with above medications and reports no side effects. -Checks blood pressure at home: averaging 120-130/80  -Following with Cardiology - last seen 05/19/21 -Last lipid panel: Lipid Panel     Component Value Date/Time   CHOL 154 03/24/2021 1044   CHOL 158 07/29/2015 1241   TRIG 88 03/24/2021 1044   HDL 55 03/24/2021 1044   HDL 47 07/29/2015 1241   CHOLHDL 2.8 03/24/2021 1044   VLDL 5 12/01/2019 0405   LDLCALC 82 03/24/2021 1044   LABVLDL 40 07/29/2015 1241   COPD: -COPD status: stable -Current medications: Albuterol PRN -Satisfied with current treatment?: no -Oxygen use: no -Dyspnea frequency: none at rest sometime with activity  -Cough frequency: not usually  -Rescue inhaler frequency:  1-2 a month  -Limitation of activity: no -Productive cough: None -Pneumovax: Not up to Date - politely declines all vaccines today -Influenza: Not up to Date -politely declines all vaccines today -Lung cancer screening: screening CT scan 7/23 LUNG-RADS 2  History of Pre-Diabetes: -A1c 8 years ago 6.7%, 5.0% last year  Health Maintenance: -Blood work up to date -Colonoscopy 06/2018, repeat in 5 years    Review of Systems  Constitutional:  Negative for chills and fever.  Eyes:  Negative for blurred vision.  Respiratory:  Negative for cough, shortness of breath and wheezing.   Cardiovascular:  Negative for chest pain.  Neurological:  Negative for headaches.      Objective:     BP 138/79   Pulse 85   Temp 97.8 F (36.6 C)   Resp 18   Ht '5\' 9"'$  (1.753 m)   Wt 118 lb  11.2 oz (53.8 kg)   SpO2 99%   BMI 17.53 kg/m  BP Readings from Last 3 Encounters:  09/28/21 138/79  07/04/21 (!) 109/58  06/29/21 (!) 146/70   Wt Readings from Last 3 Encounters:  09/28/21 118 lb 11.2 oz (53.8 kg)  07/04/21 120 lb 14.4 oz (54.8 kg)  06/29/21 127 lb (57.6 kg)      Physical Exam Constitutional:      Appearance: Normal appearance.  HENT:     Head: Normocephalic and atraumatic.     Mouth/Throat:     Mouth: Mucous membranes are moist.     Pharynx: Oropharynx is clear.  Eyes:     Conjunctiva/sclera: Conjunctivae normal.  Cardiovascular:     Rate and Rhythm: Normal rate and regular rhythm.  Pulmonary:     Effort: Pulmonary effort is normal.     Breath sounds: Normal breath sounds.  Musculoskeletal:     Right lower leg: No edema.     Left lower leg: No edema.  Skin:    General: Skin is warm and dry.  Neurological:     General: No focal deficit present.     Mental Status: He is alert. Mental status is at baseline.  Psychiatric:        Mood and Affect: Mood normal.        Behavior: Behavior normal.      No results found for any visits on 09/28/21.  Last  CBC Lab Results  Component Value Date   WBC 3.6 (L) 06/29/2021   HGB 12.8 (L) 06/29/2021   HCT 40.1 06/29/2021   MCV 96.4 06/29/2021   MCH 30.8 06/29/2021   RDW 12.8 06/29/2021   PLT 171 07/05/9483   Last metabolic panel Lab Results  Component Value Date   GLUCOSE 115 (H) 06/29/2021   NA 135 06/29/2021   K 3.7 06/29/2021   CL 102 06/29/2021   CO2 26 06/29/2021   BUN 11 06/29/2021   CREATININE 0.79 06/29/2021   GFRNONAA >60 06/29/2021   CALCIUM 8.9 06/29/2021   PROT 7.5 03/24/2021   ALBUMIN 4.5 03/27/2016   BILITOT 0.5 03/24/2021   ALKPHOS 64 03/27/2016   AST 31 03/24/2021   ALT 25 03/24/2021   ANIONGAP 7 06/29/2021   Last lipids Lab Results  Component Value Date   CHOL 154 03/24/2021   HDL 55 03/24/2021   LDLCALC 82 03/24/2021   TRIG 88 03/24/2021   CHOLHDL 2.8 03/24/2021    Last hemoglobin A1c Lab Results  Component Value Date   HGBA1C 5.0 09/23/2020   Last thyroid functions Lab Results  Component Value Date   TSH 1.766 11/30/2019   Last vitamin D No results found for: "25OHVITD2", "25OHVITD3", "VD25OH" Last vitamin B12 and Folate No results found for: "VITAMINB12", "FOLATE"    The ASCVD Risk score (Arnett DK, et al., 2019) failed to calculate for the following reasons:   The patient has a prior MI or stroke diagnosis    Assessment & Plan:   1. Primary hypertension: Chronic and stable, blood pressure borderline today but getting good readings at home. Continue to monitor and call if it is consistently >140/90. Refill Metoprolol 12.5 mg daily today. Follow up in 6 months.   - metoprolol succinate (TOPROL-XL) 25 MG 24 hr tablet; Take 0.5 tablets (12.5 mg total) by mouth daily.  Dispense: 30 tablet; Refill: 3  2. Hypercholesteremia: Stable, LDL controlled on last lipid panel in the spring. Continue Lipitor 40 mg daily, refilled today.   - atorvastatin (LIPITOR) 40 MG tablet; Take 1 tablet (40 mg total) by mouth daily.  Dispense: 90 tablet; Refill: 3  3. Centrilobular emphysema ([place]): Stable, continue Albuterol as needed, refilled today.   - albuterol (VENTOLIN HFA) 108 (90 Base) MCG/ACT inhaler; Inhale 2 puffs into the lungs every 6 (six) hours as needed for wheezing or shortness of breath.  Dispense: 6.7 g; Refill: 2  4. History of prediabetes: Recheck A1c today, good at 5.0%. - POCT HgB A1C   Return in about 6 months (around 03/29/2022).    Teodora Medici, DO

## 2021-10-18 ENCOUNTER — Other Ambulatory Visit: Payer: Self-pay | Admitting: Family Medicine

## 2021-10-18 DIAGNOSIS — G610 Guillain-Barre syndrome: Secondary | ICD-10-CM

## 2021-11-03 ENCOUNTER — Encounter: Payer: Self-pay | Admitting: Internal Medicine

## 2021-11-03 ENCOUNTER — Ambulatory Visit: Payer: Medicare PPO | Admitting: Internal Medicine

## 2021-11-03 VITALS — BP 130/78 | HR 104 | Temp 97.9°F | Resp 16 | Ht 69.0 in | Wt 117.2 lb

## 2021-11-03 DIAGNOSIS — R109 Unspecified abdominal pain: Secondary | ICD-10-CM

## 2021-11-03 DIAGNOSIS — S39012A Strain of muscle, fascia and tendon of lower back, initial encounter: Secondary | ICD-10-CM

## 2021-11-03 LAB — POCT URINALYSIS DIPSTICK
Bilirubin, UA: NEGATIVE
Blood, UA: NEGATIVE
Clarity, UA: NORMAL
Glucose, UA: NEGATIVE
Ketones, UA: NEGATIVE
Leukocytes, UA: NEGATIVE
Nitrite, UA: NEGATIVE
Odor: NORMAL
Protein, UA: NEGATIVE
Spec Grav, UA: 1.025 (ref 1.010–1.025)
Urobilinogen, UA: 0.2 E.U./dL
pH, UA: 5 (ref 5.0–8.0)

## 2021-11-03 MED ORDER — NAPROXEN 500 MG PO TABS: 500.0000 mg | ORAL_TABLET | Freq: Two times a day (BID) | ORAL | 0 refills | Status: AC

## 2021-11-03 MED ORDER — TIZANIDINE HCL 4 MG PO TABS: 4.0000 mg | ORAL_TABLET | Freq: Four times a day (QID) | ORAL | 0 refills | Status: DC | PRN

## 2021-11-03 NOTE — Patient Instructions (Signed)
It was great seeing you today!  Plan discussed at today's visit: -Naproxen 500 mg twice a day for 10 days - take with food and do NOT take with any other anti-inflammatories, Tylenol is ok -Muscle relaxer to take at night, it can make you sleepy -Use moist heat, topicals like Bengay, IcyHot, etc.  Follow up in: as needed   Take care and let us know if you have any questions or concerns prior to your next visit.  Dr. Rosana Berger  Lumbar Strain A lumbar strain, which is sometimes called a low-back strain, is a stretch or tear in a muscle or tendons in the lower back (lumbar spine). Tendons are the strong cords of tissue that attach muscle to bone. This type of injury occurs when muscles or tendons are torn or are stretched beyond their limits. Lumbar strains can range from mild to severe. Mild strains may involve stretching a muscle or tendon without tearing it. These may heal in 1-2 weeks. More severe strains involve tearing of muscle fibers or tendons. These will cause more pain and may take 6-8 weeks to heal. What are the causes? This condition may be caused by: Trauma, such as a fall or a hit to the body. Twisting or overstretching the back. This may result from doing activities that take a lot of energy, such as lifting heavy objects. What increases the risk? A lumbar strain is more common in: Athletes. People with obesity. People who do repeated lifting, bending, or other movements that involve their back. What are the signs or symptoms? Symptoms of this condition may include: Sharp or dull pain in the lower back that does not go away. The pain may spread to the buttocks. Stiffness or limited range of motion. Sudden muscle tightening (spasms). How is this diagnosed? This condition may be diagnosed based on: Your symptoms. Your medical history. A physical exam. Imaging tests, such as: X-rays. MRI. How is this treated? Treatment for this condition may include: Rest. Applying  heat and cold to the affected area. Over-the-counter medicines to help with pain and inflammation, such as NSAIDs. Prescription medicine for pain or to relax the muscles. These may be needed for a short time. Physical therapy exercises to improve movement and strength. Follow these instructions at home: Managing pain, stiffness, and swelling     If told, put ice on the injured area during the first 24 hours after your injury. Put ice in a plastic bag. Place a towel between your skin and the bag. Leave the ice on for 20 minutes, 2-3 times a day. If told, apply heat to the affected area as often as told by your health care provider. Use the heat source that your health care provider recommends, such as a moist heat pack or a heating pad. Place a towel between your skin and the heat source. Leave the heat on for 20-30 minutes. If your skin turns bright red, remove the ice or heat right away to prevent skin damage. The risk of damage is higher if you cannot feel pain, heat, or cold. Activity Rest and return to your normal activities as told by your health care provider. Ask your health care provider what activities are safe for you. Do exercises as told by your health care provider. General instructions Take over-the-counter and prescription medicines only as told by your health care provider. Ask your health care provider if the medicine prescribed to you: Requires you to avoid driving or using machinery. Can cause constipation. You may need  to take these actions to prevent or treat constipation: Drink enough fluid to keep your urine pale yellow. Take over-the-counter or prescription medicines. Eat foods that are high in fiber, such as beans, whole grains, and fresh fruits and vegetables. Limit foods that are high in fat and processed sugars, such as fried or sweet foods. Do not use any products that contain nicotine or tobacco. These products include cigarettes, chewing tobacco, and vaping  devices, such as e-cigarettes. If you need help quitting, ask your health care provider. How is this prevented? To prevent a future low-back injury: Always warm up properly before physical activity or sports. Cool down and stretch after being active. Use correct form when playing sports and lifting heavy objects. Bend your knees before you lift heavy objects. Use good posture when sitting and standing. Stay physically fit and keep a healthy weight. Do at least 150 minutes of moderate intensity exercise each week, such as brisk walking or water aerobics. Do strength exercises at least 2 times each week. Contact a health care provider if: Your back pain does not improve after several weeks of treatment. Your symptoms get worse. You have a fever. Get help right away if: Your back pain is severe. You are unable to stand or walk. You develop pain in your legs. You have weakness in your buttocks or legs. You have trouble controlling when you urinate or when you have a bowel movement. You have frequent, painful, or bloody urination. This information is not intended to replace advice given to you by your health care provider. Make sure you discuss any questions you have with your health care provider. Document Revised: 07/19/2021 Document Reviewed: 07/19/2021 Elsevier Patient Education  [place] [facility] Ask your health care provider which exercises are safe for you. Do exercises exactly as told by your health care provider and adjust them as directed. It is normal to feel mild stretching, pulling, tightness, or discomfort as you do these exercises. Stop right away if you feel sudden pain or your pain gets worse. Do not begin these exercises until told by your health care provider. Stretching and range-of-motion exercises These exercises warm up your muscles and joints and improve the movement and flexibility of your back. These exercises also help to relieve  pain, numbness, and tingling. Lumbar rotation  Lie on your back on a firm bed or the floor with your knees bent. Straighten your arms out to your sides so each arm forms a 90-degree angle (right angle) with a side of your body. Slowly move (rotate) both of your knees to one side of your body until you feel a stretch in your lower back (lumbar). Try not to let your shoulders lift off the floor. Hold this position for __________ seconds. Tense your abdominal muscles and slowly move your knees back to the starting position. Repeat this exercise on the other side of your body. Repeat __________ times. Complete this exercise __________ times a day. Single knee to chest  Lie on your back on a firm bed or the floor with both legs straight. Bend one of your knees. Use your hands to move your knee up toward your chest until you feel a gentle stretch in your lower back and buttock. Hold your leg in this position by holding on to the front of your knee. Keep your other leg as straight as possible. Hold this position for __________ seconds. Slowly return to the starting position. Repeat with your other  leg. Repeat __________ times. Complete this exercise __________ times a day. Prone extension on elbows  Lie on your abdomen on a firm bed or the floor (prone position). Prop yourself up on your elbows. Use your arms to help lift your chest up until you feel a gentle stretch in your abdomen and your lower back. This will place some of your body weight on your elbows. If this is uncomfortable, try stacking pillows under your chest. Your hips should stay down, against the surface that you are lying on. Keep your hip and back muscles relaxed. Hold this position for __________ seconds. Slowly relax your upper body and return to the starting position. Repeat __________ times. Complete this exercise __________ times a day. Strengthening exercises These exercises build strength and endurance in your back.  Endurance is the ability to use your muscles for a long time, even after they get tired. Pelvic tilt This exercise strengthens the muscles that lie deep in the abdomen. Lie on your back on a firm bed or the floor with your legs extended. Bend your knees so they are pointing toward the ceiling and your feet are flat on the floor. Tighten your lower abdominal muscles to press your lower back against the floor. This motion will tilt your pelvis so your tailbone points up toward the ceiling instead of pointing to your feet or the floor. To help with this exercise, you may place a small towel under your lower back and try to push your back into the towel. Hold this position for __________ seconds. Let your muscles relax completely before you repeat this exercise. Repeat __________ times. Complete this exercise __________ times a day. Alternating arm and leg raises  Get on your hands and knees on a firm surface. If you are on a hard floor, you may want to use padding, such as an exercise mat, to cushion your knees. Line up your arms and legs. Your hands should be directly below your shoulders, and your knees should be directly below your hips. Lift your left leg behind you. At the same time, raise your right arm and straighten it in front of you. Do not lift your leg higher than your hip. Do not lift your arm higher than your shoulder. Keep your abdominal and back muscles tight. Keep your hips facing the ground. Do not arch your back. Keep your balance carefully, and do not hold your breath. Hold this position for __________ seconds. Slowly return to the starting position. Repeat with your right leg and your left arm. Repeat __________ times. Complete this exercise __________ times a day. Abdominal set with straight leg raise  Lie on your back on a firm bed or the floor. Bend one of your knees and keep your other leg straight. Tense your abdominal muscles and lift your straight leg up, 4-6  inches (10-15 cm) off the ground. Keep your abdominal muscles tight and hold this position for __________ seconds. Do not hold your breath. Do not arch your back. Keep it flat against the ground. Keep your abdominal muscles tense as you slowly lower your leg back to the starting position. Repeat with your other leg. Repeat __________ times. Complete this exercise __________ times a day. Single leg lower with bent knees Lie on your back on a firm bed or the floor. Tense your abdominal muscles and lift your feet off the floor, one foot at a time, so your knees and hips are bent in 90-degree angles (right angles). Your knees should be  over your hips and your lower legs should be parallel to the floor. Keeping your abdominal muscles tense and your knee bent, slowly lower one of your legs so your toe touches the ground. Lift your leg back up to return to the starting position. Do not hold your breath. Do not let your back arch. Keep your back flat against the ground. Repeat with your other leg. Repeat __________ times. Complete this exercise __________ times a day. Posture and body mechanics Good posture and healthy body mechanics can help to relieve stress in your body's tissues and joints. Body mechanics refers to the movements and positions of your body while you do your daily activities. Posture is part of body mechanics. Good posture means: Your spine is in its natural S-curve position (neutral). Your shoulders are pulled back slightly. Your head is not tipped forward (neutral). Follow these guidelines to improve your posture and body mechanics in your everyday activities. Standing  When standing, keep your spine neutral and your feet about hip-width apart. Keep a slight bend in your knees. Your ears, shoulders, and hips should line up. When you do a task in which you stand in one place for a long time, place one foot up on a stable object that is 2-4 inches (5-10 cm) high, such as a  footstool. This helps keep your spine neutral. Sitting  When sitting, keep your spine neutral and keep your feet flat on the floor. Use a footrest, if necessary, and keep your thighs parallel to the floor. Avoid rounding your shoulders, and avoid tilting your head forward. When working at a desk or a computer, keep your desk at a height where your hands are slightly lower than your elbows. Slide your chair under your desk so you are close enough to maintain good posture. When working at a computer, place your monitor at a height where you are looking straight ahead and you do not have to tilt your head forward or downward to look at the screen. Resting When lying down and resting, avoid positions that are most painful for you. If you have pain with activities such as sitting, bending, stooping, or squatting, lie in a position in which your body does not bend very much. For example, avoid curling up on your side with your arms and knees near your chest (fetal position). If you have pain with activities such as standing for a long time or reaching with your arms, lie with your spine in a neutral position and bend your knees slightly. Try the following positions: Lying on your side with a pillow between your knees. Lying on your back with a pillow under your knees. Lifting  When lifting objects, keep your feet at least shoulder-width apart and tighten your abdominal muscles. Bend your knees and hips and keep your spine neutral. It is important to lift using the strength of your legs, not your back. Do not lock your knees straight out. Always ask for help to lift heavy or awkward objects. This information is not intended to replace advice given to you by your health care provider. Make sure you discuss any questions you have with your health care provider. Document Revised: 03/15/2020 Document Reviewed: 03/15/2020 Elsevier Patient Education  [place].

## 2021-11-03 NOTE — Progress Notes (Signed)
Acute Office Visit  Subjective:     Patient ID: Brent [name], male    DOB: 1945/07/04, 76 y.o.   MRN: [id number]  Chief Complaint  Patient presents with   Back Pain    Back Pain Associated symptoms include tingling. Pertinent negatives include no dysuria or fever.   Patient is in today for back/flank pain.   BACK PAIN Duration:  2 days  Mechanism of injury: no trauma - had been doing some home exercises and just increased the frequency and amount of reps a few days prior to symptoms  Location: Left and low back Onset: gradual Severity: moderate Quality: pulling Frequency: constant  Radiation: L leg above the knee Aggravating factors: movement, walking, bending, and prolonged sitting Alleviating factors: rest; took a gabapentin this morning which helped Status: stable Relief with NSAIDs?: No NSAIDs Taken Nighttime pain:  no Paresthesias / decreased sensation:  yes - tingling down left leg Bowel / bladder incontinence:  no Fevers:  no Dysuria / urinary frequency:  no  URINARY SYMPTOMS Dysuria: no Urinary frequency: no Urgency: no Foul odor: no Hematuria: no Abdominal pain: no Back pain: yes Suprapubic pain/pressure: no Flank pain: yes Fever:  no  Review of Systems  Constitutional:  Negative for chills and fever.  Genitourinary:  Positive for flank pain. Negative for dysuria, frequency, hematuria and urgency.  Musculoskeletal:  Positive for back pain.  Neurological:  Positive for tingling.        Objective:    BP 130/78   Pulse (!) 104   Temp 97.9 F (36.6 C)   Resp 16   Ht '5\' 9"'$  (1.753 m)   Wt 117 lb 3.2 oz (53.2 kg)   SpO2 (!) 88%   BMI 17.31 kg/m  BP Readings from Last 3 Encounters:  11/03/21 130/78  09/28/21 138/79  07/04/21 (!) 109/58   Wt Readings from Last 3 Encounters:  11/03/21 117 lb 3.2 oz (53.2 kg)  09/28/21 118 lb 11.2 oz (53.8 kg)  07/04/21 120 lb 14.4 oz (54.8 kg)      Physical Exam Constitutional:      Appearance:  Normal appearance.  HENT:     Head: Normocephalic and atraumatic.  Eyes:     Conjunctiva/sclera: Conjunctivae normal.  Cardiovascular:     Rate and Rhythm: Normal rate and regular rhythm.  Pulmonary:     Effort: Pulmonary effort is normal.     Breath sounds: Normal breath sounds.  Abdominal:     Tenderness: There is no right CVA tenderness or left CVA tenderness.  Musculoskeletal:        General: Tenderness present. No swelling.     Lumbar back: Spasms and tenderness present. Decreased range of motion.     Comments: Good flexion and extension range of motion but decreased with bilateral sidebending and bilateral rotation with hypertonicity in the left sided lumbar paraspinal muscles  Skin:    General: Skin is warm and dry.  Neurological:     General: No focal deficit present.     Mental Status: He is alert. Mental status is at baseline.  Psychiatric:        Mood and Affect: Mood normal.        Behavior: Behavior normal.     No results found for any visits on 11/03/21.      Assessment & Plan:   1. Strain of lumbar region, initial encounter/Flank pain: Symptoms consistent with acute left-sided lumbar strain after his workout.  We will treat with scheduled anti-inflammatories,  naproxen 500 mg twice daily for 10 days as well as tizanidine 4 mg as needed.  Counseled patient to rest and use moist heat as well as topicals such as Bengay, Voltaren, etc. Urine today in the office negative for signs of infection. Patient will follow-up if symptoms worsen or fail to improve.  - POCT Urinalysis Dipstick - naproxen (NAPROSYN) 500 MG tablet; Take 1 tablet (500 mg total) by mouth 2 (two) times daily with a meal for 10 days.  Dispense: 20 tablet; Refill: 0 - tiZANidine (ZANAFLEX) 4 MG tablet; Take 1 tablet (4 mg total) by mouth every 6 (six) hours as needed for muscle spasms.  Dispense: 30 tablet; Refill: 0   Return if symptoms worsen or fail to improve.  Teodora Medici, DO

## 2021-11-07 ENCOUNTER — Encounter (INDEPENDENT_AMBULATORY_CARE_PROVIDER_SITE_OTHER): Payer: Self-pay

## 2021-11-30 ENCOUNTER — Other Ambulatory Visit (INDEPENDENT_AMBULATORY_CARE_PROVIDER_SITE_OTHER): Payer: Self-pay | Admitting: Nurse Practitioner

## 2021-11-30 DIAGNOSIS — I739 Peripheral vascular disease, unspecified: Secondary | ICD-10-CM

## 2021-12-04 DIAGNOSIS — I70219 Atherosclerosis of native arteries of extremities with intermittent claudication, unspecified extremity: Secondary | ICD-10-CM | POA: Insufficient documentation

## 2021-12-04 DIAGNOSIS — E7800 Pure hypercholesterolemia, unspecified: Secondary | ICD-10-CM | POA: Insufficient documentation

## 2021-12-04 DIAGNOSIS — E785 Hyperlipidemia, unspecified: Secondary | ICD-10-CM | POA: Insufficient documentation

## 2021-12-04 NOTE — Progress Notes (Signed)
MRN : [id number]  Brent [name] is a 76 y.o. (25-Jan-1945) male who presents with chief complaint of check circulation.  History of Present Illness:   The patient returns to the office for followup and review of the noninvasive studies.   There have been no interval changes in lower extremity symptoms. No interval shortening of the patient's claudication distance or development of rest pain symptoms. No new ulcers or wounds have occurred since the last visit.  There have been no significant changes to the patient's overall health care.  The patient denies amaurosis fugax or recent TIA symptoms. There are no documented recent neurological changes noted. There is no history of DVT, PE or superficial thrombophlebitis. The patient denies recent episodes of angina or shortness of breath.   ABI Rt=0.97 and Lt=0.82  (previous ABI's Rt=0.98 (monophasic) and Lt=0.98 (monophasic))   No outpatient medications have been marked as taking for the 12/05/21 encounter (Appointment) with Delana Meyer, Dolores Lory, MD.    Past Medical History:  Diagnosis Date   Aortic calcification ([place]) 07/2015   Noted on chest CT with contrast   Cataract    Centrilobular emphysema ([place]) 08/03/2015   Noted on chest CT July 2017   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[place]) 07/29/2015   Coronary artery calcification seen on CAT scan 07/29/2015   Noted on CT scan July 2017   Guillain Barr syndrome [facility]) July 2015   follow Prevnar vaccine   Guillain Barr syndrome [facility])    History of colonic polyps    Hyperlipidemia    Hypochloremia    Osteopenia 08/29/2016   August 2018; next scan August 2020   Right Supraclavicular Lymph node 08/03/2015   Solitary pulmonary nodule 08/03/2015   CT Chest 07/2015 - Subpleural 3 mm left upper lobe pulmonary nodule, probably benign.   Tobacco abuse     Past Surgical History:  Procedure Laterality Date   COLONOSCOPY WITH PROPOFOL N/A 09/22/2014   Procedure: COLONOSCOPY WITH  PROPOFOL;  Surgeon: Lucilla Lame, MD;  Location: [facility] ENDOSCOPY;  Service: Endoscopy;  Laterality: N/A;   COLONOSCOPY WITH PROPOFOL N/A 07/09/2018   Procedure: COLONOSCOPY WITH PROPOFOL;  Surgeon: Lucilla Lame, MD;  Location: [facility] ENDOSCOPY;  Service: Endoscopy;  Laterality: N/A;   DEEP NECK LYMPH NODE BIOPSY / EXCISION Right    HERNIA REPAIR  1988   LEFT HEART CATH AND CORONARY ANGIOGRAPHY N/A 12/01/2019   Procedure: LEFT HEART CATH AND CORONARY ANGIOGRAPHY;  Surgeon: Wellington Hampshire, MD;  Location: [place] [facility] LAB;  Service: Cardiovascular;  Laterality: N/A;   LYMPH NODE BIOPSY     NM MYOVIEW ([place] [facility])  10/09/2013   Dr. Lujean Amel: EF 58%. Normal wall motion. No ischemia or infarction.   shoulder     SHOULDER ARTHROSCOPY Right    TRANSTHORACIC ECHOCARDIOGRAM  09/24/2013   EF 45-50% with mild global hypokinesis. GR 1 DD. No valvular lesions besides mild TR. Normal RV pressures.    Social History Social History   Tobacco Use   Smoking status: Some Days    Packs/day: 0.15    Years: 56.00    Total pack years: 8.40    Types: Cigarettes    Start date: 1964   Smokeless tobacco: Never   Tobacco comments:    New quit date 02/24/20 - made 02/05/20; currently smoking 3 cigs per week 05/20/20  Vaping Use   Vaping Use: Never used  Substance Use Topics   Alcohol  use: No   Drug use: No    Family History Family History  Problem Relation Age of Onset   Alcohol abuse Mother    Healthy Daughter    Parkinson's disease Other    Stroke Other    Diabetes Sister    Heart disease Other    Heart disease Other    Alcohol abuse Brother    Cancer Paternal Grandfather        pancreatic   Heart disease Sister    Heart disease Sister    Healthy Son    Healthy Daughter    Bladder Cancer Neg Hx    Kidney cancer Neg Hx     Allergies  Allergen Reactions   Influenza Vaccines     GUILLAIN BARRE SYNDROME    Pneumococcal Vaccine Other (See Comments)    GUILLAIN BARRE  SYNDROME NUMBNESS, SOB,CHEST PAIN    Prevnar [Pneumococcal 13-Val Conj Vacc] Other (See Comments)    GUILLAIN-BARRE SYNDROME     REVIEW OF SYSTEMS (Negative unless checked)  Constitutional: '[]'$ Weight loss  '[]'$ Fever  '[]'$ Chills Cardiac: '[]'$ Chest pain   '[]'$ Chest pressure   '[]'$ Palpitations   '[]'$ Shortness of breath when laying flat   '[]'$ Shortness of breath with exertion. Vascular:  '[x]'$ Pain in legs with walking   '[]'$ Pain in legs at rest  '[]'$ History of DVT   '[]'$ Phlebitis   '[]'$ Swelling in legs   '[]'$ Varicose veins   '[]'$ Non-healing ulcers Pulmonary:   '[]'$ Uses home oxygen   '[]'$ Productive cough   '[]'$ Hemoptysis   '[]'$ Wheeze  '[]'$ COPD   '[]'$ Asthma Neurologic:  '[]'$ Dizziness   '[]'$ Seizures   '[]'$ History of stroke   '[]'$ History of TIA  '[]'$ Aphasia   '[]'$ Vissual changes   '[]'$ Weakness or numbness in arm   '[]'$ Weakness or numbness in leg Musculoskeletal:   '[]'$ Joint swelling   '[]'$ Joint pain   '[]'$ Low back pain Hematologic:  '[]'$ Easy bruising  '[]'$ Easy bleeding   '[]'$ Hypercoagulable state   '[]'$ Anemic Gastrointestinal:  '[]'$ Diarrhea   '[]'$ Vomiting  '[]'$ Gastroesophageal reflux/heartburn   '[]'$ Difficulty swallowing. Genitourinary:  '[]'$ Chronic kidney disease   '[]'$ Difficult urination  '[]'$ Frequent urination   '[]'$ Blood in urine Skin:  '[]'$ Rashes   '[]'$ Ulcers  Psychological:  '[]'$ History of anxiety   '[]'$  History of major depression.  Physical Examination  There were no vitals filed for this visit. There is no height or weight on file to calculate BMI. Gen: WD/WN, NAD Head: [place]/AT, No temporalis wasting.  Ear/Nose/Throat: Hearing grossly intact, nares w/o erythema or drainage Eyes: PER, EOMI, sclera nonicteric.  Neck: Supple, no masses.  No bruit or JVD.  Pulmonary:  Good air movement, no audible wheezing, no use of accessory muscles.  Cardiac: RRR, normal S1, S2, no Murmurs. Vascular:  mild trophic changes, no open wounds Vessel Right Left  Radial Palpable Palpable  PT Not Palpable Not Palpable  DP Not Palpable Not Palpable  Gastrointestinal: soft, non-distended. No  guarding/no peritoneal signs.  Musculoskeletal: M/S 5/5 throughout.  No visible deformity.  Neurologic: CN 2-12 intact. Pain and light touch intact in extremities.  Symmetrical.  Speech is fluent. Motor exam as listed above. Psychiatric: Judgment intact, Mood & affect appropriate for pt's clinical situation. Dermatologic: No rashes or ulcers noted.  No changes consistent with cellulitis.   CBC Lab Results  Component Value Date   WBC 3.6 (L) 06/29/2021   HGB 12.8 (L) 06/29/2021   HCT 40.1 06/29/2021   MCV 96.4 06/29/2021   PLT 171 06/29/2021    BMET    Component Value Date/Time   NA 135 06/29/2021 1215  NA 133 (L) 08/07/2013 0401   K 3.7 06/29/2021 1215   K 3.9 08/07/2013 0401   CL 102 06/29/2021 1215   CL 98 08/07/2013 0401   CO2 26 06/29/2021 1215   CO2 28 08/07/2013 0401   GLUCOSE 115 (H) 06/29/2021 1215   GLUCOSE 90 08/07/2013 0401   BUN 11 06/29/2021 1215   BUN 14 08/07/2013 0401   CREATININE 0.79 06/29/2021 1215   CREATININE 0.91 03/24/2021 1044   CALCIUM 8.9 06/29/2021 1215   CALCIUM 9.0 08/07/2013 0401   GFRNONAA >60 06/29/2021 1215   GFRNONAA 89 10/29/2019 0903   GFRAA 103 10/29/2019 0903   CrCl cannot be calculated (Patient's most recent lab result is older than the maximum 21 days allowed.).  COAG Lab Results  Component Value Date   INR 1.2 11/30/2019   INR 1.1 08/02/2013    Radiology No results found.   Assessment/Plan 1. Atherosclerosis of native artery of both lower extremities with intermittent claudication (HCC)  Recommend:  The patient has evidence of atherosclerosis of the lower extremities with claudication.  The patient does not voice lifestyle limiting changes at this point in time.  Noninvasive studies do not suggest clinically significant change.  No invasive studies, angiography or surgery at this time The patient should continue walking and begin a more formal exercise program.  The patient should continue antiplatelet therapy  and aggressive treatment of the lipid abnormalities  No changes in the patient's medications at this time  Continued surveillance is indicated as atherosclerosis is likely to progress with time.    The patient will continue follow up with noninvasive studies as ordered.  - VAS [place] ABI WITH/WO TBI; Future  2. Stenosis of carotid artery, unspecified laterality Recommend:  Given the patient's asymptomatic subcritical stenosis no further invasive testing or surgery at this time.  Continue antiplatelet therapy as prescribed Continue management of CAD, HTN and Hyperlipidemia Healthy heart diet,  encouraged exercise at least 4 times per week Follow up in 12 months with duplex ultrasound and physical exam   3. Coronary artery disease involving native coronary artery of native heart with angina pectoris ([place]) Continue cardiac and antihypertensive medications as already ordered and reviewed, no changes at this time.  Continue statin as ordered and reviewed, no changes at this time  Nitrates PRN for chest pain  4. Centrilobular emphysema (HCC) Continue pulmonary medications and aerosols as already ordered, these medications have been reviewed and there are no changes at this time.   5. Mixed hyperlipidemia Continue statin as ordered and reviewed, no changes at this time    Hortencia Pilar, MD  12/04/2021 3:41 PM

## 2021-12-05 ENCOUNTER — Ambulatory Visit (INDEPENDENT_AMBULATORY_CARE_PROVIDER_SITE_OTHER): Payer: Medicare PPO | Admitting: Vascular Surgery

## 2021-12-05 ENCOUNTER — Encounter (INDEPENDENT_AMBULATORY_CARE_PROVIDER_SITE_OTHER): Payer: Self-pay | Admitting: Vascular Surgery

## 2021-12-05 ENCOUNTER — Ambulatory Visit (INDEPENDENT_AMBULATORY_CARE_PROVIDER_SITE_OTHER): Payer: Medicare PPO

## 2021-12-05 VITALS — BP 103/61 | HR 99 | Resp 18 | Ht 69.5 in | Wt 118.4 lb

## 2021-12-05 DIAGNOSIS — J432 Centrilobular emphysema: Secondary | ICD-10-CM | POA: Diagnosis not present

## 2021-12-05 DIAGNOSIS — I70213 Atherosclerosis of native arteries of extremities with intermittent claudication, bilateral legs: Secondary | ICD-10-CM | POA: Diagnosis not present

## 2021-12-05 DIAGNOSIS — I739 Peripheral vascular disease, unspecified: Secondary | ICD-10-CM | POA: Diagnosis not present

## 2021-12-05 DIAGNOSIS — I25119 Atherosclerotic heart disease of native coronary artery with unspecified angina pectoris: Secondary | ICD-10-CM | POA: Diagnosis not present

## 2021-12-05 DIAGNOSIS — I6529 Occlusion and stenosis of unspecified carotid artery: Secondary | ICD-10-CM

## 2021-12-05 DIAGNOSIS — E782 Mixed hyperlipidemia: Secondary | ICD-10-CM | POA: Diagnosis not present

## 2022-03-09 ENCOUNTER — Telehealth: Payer: Self-pay | Admitting: Internal Medicine

## 2022-03-09 NOTE — Telephone Encounter (Signed)
Contacted Thayer Dallas Hanning to schedule their annual wellness visit. Appointment made for 05/26/2022.  [place] Direct Dial: [phone]

## 2022-03-24 ENCOUNTER — Other Ambulatory Visit: Payer: Self-pay | Admitting: Internal Medicine

## 2022-03-24 DIAGNOSIS — I10 Essential (primary) hypertension: Secondary | ICD-10-CM

## 2022-03-24 NOTE — Telephone Encounter (Signed)
Rx already ordered today- duplicate request Requested Prescriptions  Pending Prescriptions Disp Refills   metoprolol succinate (TOPROL-XL) 25 MG 24 hr tablet [Pharmacy Med Name: METOPROLOL ER SUCCINATE 25MG  TABS] 30 tablet 3    Sig: TAKE 1/2 TABLET(12.5 MG) BY MOUTH DAILY     Cardiovascular:  Beta Blockers Passed - 03/24/2022  1:22 PM      Passed - Last BP in normal range    BP Readings from Last 1 Encounters:  12/05/21 103/61         Passed - Last Heart Rate in normal range    Pulse Readings from Last 1 Encounters:  12/05/21 99         Passed - Valid encounter within last 6 months    Recent Outpatient Visits           4 months ago Strain of lumbar region, initial encounter   [place], DO   5 months ago [facility] Teodora Medici, DO   8 months ago [facility] Teodora Medici, DO   1 year ago [facility] Delsa Grana, PA-C   1 year ago [facility] Delsa Grana, [place]       Future Appointments             In 5 days Teodora Medici, [facility], [place]   In 2 months  [facility], [facility]             metoprolol succinate (TOPROL-XL) 25 MG 24 hr tablet [Pharmacy Med Name: METOPROLOL ER SUCCINATE 25MG  TABS] 45 tablet     Sig: TAKE 1/2 TABLET(12.5 MG) BY MOUTH DAILY     Cardiovascular:  Beta Blockers Passed - 03/24/2022  1:22 PM      Passed - Last BP in normal range    BP Readings from Last 1 Encounters:  12/05/21 103/61         Passed - Last Heart Rate in normal range    Pulse Readings from Last 1 Encounters:  12/05/21 99         Passed - Valid encounter within last 6 months    Recent Outpatient Visits           4 months ago Strain of lumbar region, initial encounter   [place], DO   5 months ago [place], DO   8 months ago [facility] Teodora Medici, DO   1 year ago [facility] Delsa Grana, PA-C   1 year ago [facility] Delsa Grana, PA-C       Future Appointments             In 5 days Teodora Medici, [facility], [place]   In 2 months  [facility], [facility]

## 2022-03-24 NOTE — Telephone Encounter (Signed)
Future visit in 5 days. Has been taking same dose since 2021. Requested Prescriptions  Pending Prescriptions Disp Refills   metoprolol succinate (TOPROL-XL) 25 MG 24 hr tablet [Pharmacy Med Name: METOPROLOL ER SUCCINATE 25MG  TABS] 30 tablet 0    Sig: TAKE 1/2 TABLET(12.5 MG) BY MOUTH DAILY     Cardiovascular:  Beta Blockers Passed - 03/24/2022  6:33 AM      Passed - Last BP in normal range    BP Readings from Last 1 Encounters:  12/05/21 103/61         Passed - Last Heart Rate in normal range    Pulse Readings from Last 1 Encounters:  12/05/21 99         Passed - Valid encounter within last 6 months    Recent Outpatient Visits           4 months ago Strain of lumbar region, initial encounter   [place], DO   5 months ago [facility] Teodora Medici, DO   8 months ago [facility] Teodora Medici, DO   1 year ago [facility] Delsa Grana, PA-C   1 year ago [facility] Delsa Grana, [place]       Future Appointments             In 5 days Teodora Medici, [facility], [facility]   In 2 months  [facility], [facility]

## 2022-03-27 NOTE — Progress Notes (Unsigned)
Established Patient Office Visit  Subjective   Patient ID: Brent [name], male    DOB: Mar 21, 1945  Age: 77 y.o. MRN: [id number]  No chief complaint on file.   HPI  Patient is here for follow up on chronic medical conditions.   HTN/Hx of NSTEMI/CAD/HLD: -NSTEMI 11/2019 -Medications: Metoprolol 12.5 mg, Lipitor 40 mg, aspirin -Patient is compliant with above medications and reports no side effects. -Checks blood pressure at home: averaging 120-130/80  -Following with Cardiology - last seen 05/19/21 -Last lipid panel: Lipid Panel     Component Value Date/Time   CHOL 154 03/24/2021 1044   CHOL 158 07/29/2015 1241   TRIG 88 03/24/2021 1044   HDL 55 03/24/2021 1044   HDL 47 07/29/2015 1241   CHOLHDL 2.8 03/24/2021 1044   VLDL 5 12/01/2019 0405   LDLCALC 82 03/24/2021 1044   LABVLDL 40 07/29/2015 1241   COPD: -COPD status: stable -Current medications: Albuterol PRN -Satisfied with current treatment?: no -Oxygen use: no -Dyspnea frequency: none at rest sometime with activity  -Cough frequency: not usually  -Rescue inhaler frequency:  1-2 a month  -Limitation of activity: no -Productive cough: None -Pneumovax: Not up to Date - politely declines all vaccines today -Influenza: Not up to Date -politely declines all vaccines today -Lung cancer screening: screening CT scan 7/23 LUNG-RADS 2  History of Pre-Diabetes: -A1c 8 years ago 6.7%, 5.0% last year  Health Maintenance: -Blood work up to date -Colonoscopy 06/2018, repeat in 5 years    Review of Systems  Constitutional:  Negative for chills and fever.  Eyes:  Negative for blurred vision.  Respiratory:  Negative for cough, shortness of breath and wheezing.   Cardiovascular:  Negative for chest pain.  Neurological:  Negative for headaches.      Objective:     There were no vitals taken for this visit. BP Readings from Last 3 Encounters:  12/05/21 103/61  11/03/21 130/78  09/28/21 138/79   Wt Readings  from Last 3 Encounters:  12/05/21 118 lb 6.4 oz (53.7 kg)  11/03/21 117 lb 3.2 oz (53.2 kg)  09/28/21 118 lb 11.2 oz (53.8 kg)      Physical Exam Constitutional:      Appearance: Normal appearance.  HENT:     Head: Normocephalic and atraumatic.     Mouth/Throat:     Mouth: Mucous membranes are moist.     Pharynx: Oropharynx is clear.  Eyes:     Conjunctiva/sclera: Conjunctivae normal.  Cardiovascular:     Rate and Rhythm: Normal rate and regular rhythm.  Pulmonary:     Effort: Pulmonary effort is normal.     Breath sounds: Normal breath sounds.  Musculoskeletal:     Right lower leg: No edema.     Left lower leg: No edema.  Skin:    General: Skin is warm and dry.  Neurological:     General: No focal deficit present.     Mental Status: He is alert. Mental status is at baseline.  Psychiatric:        Mood and Affect: Mood normal.        Behavior: Behavior normal.      No results found for any visits on 03/29/22.  Last CBC Lab Results  Component Value Date   WBC 3.6 (L) 06/29/2021   HGB 12.8 (L) 06/29/2021   HCT 40.1 06/29/2021   MCV 96.4 06/29/2021   MCH 30.8 06/29/2021   RDW 12.8 06/29/2021   PLT 171 06/29/2021  Last metabolic panel Lab Results  Component Value Date   GLUCOSE 115 (H) 06/29/2021   NA 135 06/29/2021   K 3.7 06/29/2021   CL 102 06/29/2021   CO2 26 06/29/2021   BUN 11 06/29/2021   CREATININE 0.79 06/29/2021   GFRNONAA >60 06/29/2021   CALCIUM 8.9 06/29/2021   PROT 7.5 03/24/2021   ALBUMIN 4.5 03/27/2016   BILITOT 0.5 03/24/2021   ALKPHOS 64 03/27/2016   AST 31 03/24/2021   ALT 25 03/24/2021   ANIONGAP 7 06/29/2021   Last lipids Lab Results  Component Value Date   CHOL 154 03/24/2021   HDL 55 03/24/2021   LDLCALC 82 03/24/2021   TRIG 88 03/24/2021   CHOLHDL 2.8 03/24/2021   Last hemoglobin A1c Lab Results  Component Value Date   HGBA1C 5.1 09/28/2021   Last thyroid functions Lab Results  Component Value Date   TSH 1.766  11/30/2019   Last vitamin D No results found for: "25OHVITD2", "25OHVITD3", "VD25OH" Last vitamin B12 and Folate No results found for: "VITAMINB12", "FOLATE"    The ASCVD Risk score (Arnett DK, et al., 2019) failed to calculate for the following reasons:   The patient has a prior MI or stroke diagnosis    Assessment & Plan:   1. Primary hypertension: Chronic and stable, blood pressure borderline today but getting good readings at home. Continue to monitor and call if it is consistently >140/90. Refill Metoprolol 12.5 mg daily today. Follow up in 6 months.   - metoprolol succinate (TOPROL-XL) 25 MG 24 hr tablet; Take 0.5 tablets (12.5 mg total) by mouth daily.  Dispense: 30 tablet; Refill: 3  2. Hypercholesteremia: Stable, LDL controlled on last lipid panel in the spring. Continue Lipitor 40 mg daily, refilled today.   - atorvastatin (LIPITOR) 40 MG tablet; Take 1 tablet (40 mg total) by mouth daily.  Dispense: 90 tablet; Refill: 3  3. Centrilobular emphysema ([place]): Stable, continue Albuterol as needed, refilled today.   - albuterol (VENTOLIN HFA) 108 (90 Base) MCG/ACT inhaler; Inhale 2 puffs into the lungs every 6 (six) hours as needed for wheezing or shortness of breath.  Dispense: 6.7 g; Refill: 2  4. History of prediabetes: Recheck A1c today, good at 5.0%. - POCT HgB A1C   No follow-ups on file.    Teodora Medici, DO

## 2022-03-29 ENCOUNTER — Encounter: Payer: Self-pay | Admitting: Internal Medicine

## 2022-03-29 ENCOUNTER — Ambulatory Visit: Payer: Medicare PPO | Admitting: Internal Medicine

## 2022-03-29 VITALS — BP 110/72 | HR 91 | Temp 98.3°F | Resp 16 | Ht 69.0 in | Wt 119.5 lb

## 2022-03-29 DIAGNOSIS — E7800 Pure hypercholesterolemia, unspecified: Secondary | ICD-10-CM | POA: Diagnosis not present

## 2022-03-29 DIAGNOSIS — Z125 Encounter for screening for malignant neoplasm of prostate: Secondary | ICD-10-CM | POA: Diagnosis not present

## 2022-03-29 DIAGNOSIS — I214 Non-ST elevation (NSTEMI) myocardial infarction: Secondary | ICD-10-CM | POA: Diagnosis not present

## 2022-03-29 DIAGNOSIS — I25119 Atherosclerotic heart disease of native coronary artery with unspecified angina pectoris: Secondary | ICD-10-CM | POA: Diagnosis not present

## 2022-03-29 DIAGNOSIS — J432 Centrilobular emphysema: Secondary | ICD-10-CM

## 2022-03-29 DIAGNOSIS — I10 Essential (primary) hypertension: Secondary | ICD-10-CM

## 2022-03-29 DIAGNOSIS — G610 Guillain-Barre syndrome: Secondary | ICD-10-CM | POA: Diagnosis not present

## 2022-03-29 DIAGNOSIS — Z87898 Personal history of other specified conditions: Secondary | ICD-10-CM

## 2022-03-29 DIAGNOSIS — R7303 Prediabetes: Secondary | ICD-10-CM | POA: Diagnosis not present

## 2022-03-29 MED ORDER — GABAPENTIN 100 MG PO CAPS: 100.0000 mg | ORAL_CAPSULE | Freq: Every day | ORAL | 1 refills | Status: DC | PRN

## 2022-03-29 MED ORDER — METOPROLOL SUCCINATE ER 25 MG PO TB24: ORAL_TABLET | ORAL | 1 refills | Status: DC

## 2022-03-29 NOTE — Patient Instructions (Signed)
It was great seeing you today!  Plan discussed at today's visit: -Blood work ordered today, results will be uploaded to MyChart.  -Medications refilled   Follow up in: 6 months   Take care and let us know if you have any questions or concerns prior to your next visit.  Dr. Yadriel Kerrigan  

## 2022-03-30 LAB — CBC WITH DIFFERENTIAL/PLATELET
Absolute Monocytes: 467 cells/uL (ref 200–950)
Basophils Absolute: 19 cells/uL (ref 0–200)
Basophils Relative: 0.5 %
Eosinophils Absolute: 80 cells/uL (ref 15–500)
Eosinophils Relative: 2.1 %
HCT: 45.7 % (ref 38.5–50.0)
Hemoglobin: 15.3 g/dL (ref 13.2–17.1)
Lymphs Abs: 1596 cells/uL (ref 850–3900)
MCH: 31.2 pg (ref 27.0–33.0)
MCHC: 33.5 g/dL (ref 32.0–36.0)
MCV: 93.3 fL (ref 80.0–100.0)
MPV: 10.1 fL (ref 7.5–12.5)
Monocytes Relative: 12.3 %
Neutro Abs: 1638 cells/uL (ref 1500–7800)
Neutrophils Relative %: 43.1 %
Platelets: 251 10*3/uL (ref 140–400)
RBC: 4.9 10*6/uL (ref 4.20–5.80)
RDW: 11.8 % (ref 11.0–15.0)
Total Lymphocyte: 42 %
WBC: 3.8 10*3/uL (ref 3.8–10.8)

## 2022-03-30 LAB — LIPID PANEL
Cholesterol: 121 mg/dL (ref <200)
HDL: 61 mg/dL (ref >=40)
LDL Cholesterol (Calc): 46 mg/dL (calc)
Non-HDL Cholesterol (Calc): 60 mg/dL (calc) (ref <130)
Total CHOL/HDL Ratio: 2 (calc) (ref <5.0)
Triglycerides: 49 mg/dL (ref <150)

## 2022-03-30 LAB — COMPLETE METABOLIC PANEL WITH GFR
AG Ratio: 1.5 (calc) (ref 1.0–2.5)
ALT: 36 U/L (ref 9–46)
AST: 39 U/L — ABNORMAL HIGH (ref 10–35)
Albumin: 4.6 g/dL (ref 3.6–5.1)
Alkaline phosphatase (APISO): 83 U/L (ref 35–144)
BUN: 16 mg/dL (ref 7–25)
CO2: 28 mmol/L (ref 20–32)
Calcium: 10.1 mg/dL (ref 8.6–10.3)
Chloride: 102 mmol/L (ref 98–110)
Creat: 0.84 mg/dL (ref 0.70–1.28)
Globulin: 3 g/dL (calc) (ref 1.9–3.7)
Glucose, Bld: 89 mg/dL (ref 65–99)
Potassium: 5.1 mmol/L (ref 3.5–5.3)
Sodium: 139 mmol/L (ref 135–146)
Total Bilirubin: 0.5 mg/dL (ref 0.2–1.2)
Total Protein: 7.6 g/dL (ref 6.1–8.1)
eGFR: 91 mL/min/{1.73_m2} (ref >=60)

## 2022-03-30 LAB — HEMOGLOBIN A1C
Hgb A1c MFr Bld: 5.4 % of total Hgb (ref <5.7)
Mean Plasma Glucose: 108 mg/dL
eAG (mmol/L): 6 mmol/L

## 2022-03-30 LAB — PSA: PSA: 0.33 ng/mL (ref <=4.00)

## 2022-04-16 ENCOUNTER — Other Ambulatory Visit: Payer: Self-pay | Admitting: Internal Medicine

## 2022-04-16 DIAGNOSIS — G610 Guillain-Barre syndrome: Secondary | ICD-10-CM

## 2022-04-18 NOTE — Telephone Encounter (Signed)
Pt is calling to check on the status of his medication refill. Per pt he is out of medication.   gabapentin (NEURONTIN) 100 MG capsule   [id number] - [place], [place] - [address] AT [facility] [address] Phone: [phone]  Fax: [phone]     Please advise.

## 2022-04-18 NOTE — Telephone Encounter (Signed)
Unable to refill per protocol, Rx request is too soon. Last refill 03/29/22 for 90 and 1 refill.  Requested Prescriptions  Pending Prescriptions Disp Refills   gabapentin (NEURONTIN) 100 MG capsule [Pharmacy Med Name: GABAPENTIN 100MG  CAPSULES] 90 capsule 1    Sig: TAKE 1 CAPSULE(100 MG) BY MOUTH DAILY     Neurology: Anticonvulsants - gabapentin Passed - 04/16/2022 11:04 AM      Passed - Cr in normal range and within 360 days    Creat  Date Value Ref Range Status  03/29/2022 0.84 0.70 - 1.28 mg/dL Final         Passed - Completed PHQ-2 or PHQ-9 in the last 360 days      Passed - Valid encounter within last 12 months    Recent Outpatient Visits           2 weeks ago Primary hypertension   [facility] Margarita Mail, DO   5 months ago Strain of lumbar region, initial encounter   [facility] Margarita Mail, DO   6 months ago Hypercholesteremia   [facility] Margarita Mail, DO   9 months ago COVID-19   [facility] Margarita Mail, DO   1 year ago Hypercholesteremia   [facility] Danelle Berry, PA-C       Future Appointments             In 1 month  [facility], [facility]   In 5 months Margarita Mail, DO [facility], [facility]

## 2022-04-27 DIAGNOSIS — Z85818 Personal history of malignant neoplasm of other sites of lip, oral cavity, and pharynx: Secondary | ICD-10-CM | POA: Diagnosis not present

## 2022-04-27 DIAGNOSIS — F17200 Nicotine dependence, unspecified, uncomplicated: Secondary | ICD-10-CM | POA: Diagnosis not present

## 2022-04-27 DIAGNOSIS — Z08 Encounter for follow-up examination after completed treatment for malignant neoplasm: Secondary | ICD-10-CM | POA: Diagnosis not present

## 2022-05-03 ENCOUNTER — Other Ambulatory Visit: Payer: Self-pay | Admitting: Internal Medicine

## 2022-05-03 DIAGNOSIS — G610 Guillain-Barre syndrome: Secondary | ICD-10-CM

## 2022-05-03 MED ORDER — GABAPENTIN 100 MG PO CAPS: 100.0000 mg | ORAL_CAPSULE | Freq: Every day | ORAL | 1 refills | Status: DC | PRN

## 2022-05-03 NOTE — Telephone Encounter (Signed)
Requested Prescriptions  Pending Prescriptions Disp Refills   gabapentin (NEURONTIN) 100 MG capsule 90 capsule 1    Sig: Take 1 capsule (100 mg total) by mouth daily as needed.     Neurology: Anticonvulsants - gabapentin Passed - 05/03/2022  4:56 PM      Passed - Cr in normal range and within 360 days    Creat  Date Value Ref Range Status  03/29/2022 0.84 0.70 - 1.28 mg/dL Final         Passed - Completed PHQ-2 or PHQ-9 in the last 360 days      Passed - Valid encounter within last 12 months    Recent Outpatient Visits           1 month ago Primary hypertension   [facility] Margarita Mail, DO   6 months ago Strain of lumbar region, initial encounter   [facility] Margarita Mail, DO   7 months ago Hypercholesteremia   [facility] Margarita Mail, DO   10 months ago COVID-19   [facility] Margarita Mail, DO   1 year ago Hypercholesteremia   [facility] Danelle Berry, PA-C       Future Appointments             In 3 weeks  [facility], [facility]   In 4 months Margarita Mail, DO [facility], [facility]

## 2022-05-03 NOTE — Telephone Encounter (Signed)
Medication Refill - Medication: gabapentin (NEURONTIN) 100 MG capsule   Pt is out and says he requested this refill over a week ago at his pharmacy   Has the patient contacted their pharmacy? Yes. (Agent: If no, request that the patient contact the pharmacy for the refill. If patient does not wish to contact the pharmacy document the reason why and proceed with request.) (Agent: If yes, when and what did the pharmacy advise?)  Preferred Pharmacy (with phone number or street name):   [id number] - [place], [place] - [address] AT [facility] OF [address] [facility] [address]  [address] [place] [address]  Phone: [phone] Fax: [phone]   Has the patient been seen for an appointment in the last year OR does the patient have an upcoming appointment? Yes.    Agent: Please be advised that RX refills may take up to 3 business days. We ask that you follow-up with your pharmacy.

## 2022-05-26 ENCOUNTER — Ambulatory Visit (INDEPENDENT_AMBULATORY_CARE_PROVIDER_SITE_OTHER): Payer: Medicare PPO

## 2022-05-26 VITALS — BP 102/58 | Ht 69.5 in | Wt 114.9 lb

## 2022-05-26 DIAGNOSIS — Z0000 Encounter for general adult medical examination without abnormal findings: Secondary | ICD-10-CM

## 2022-05-26 DIAGNOSIS — Z122 Encounter for screening for malignant neoplasm of respiratory organs: Secondary | ICD-10-CM

## 2022-05-26 NOTE — Progress Notes (Signed)
Subjective:   Brent [name] is a 77 y.o. male who presents for Medicare Annual/Subsequent preventive examination.  Review of Systems    Cardiac Risk Factors include: advanced age (>38men, >61 women);dyslipidemia;hypertension;male gender;sedentary lifestyle;smoking/ tobacco exposure    Objective:    Today's Vitals   05/26/22 0820  BP: (!) 102/58  Weight: 114 lb 14.4 oz (52.1 kg)  Height: 5' 9.5" (1.765 m)   Body mass index is 16.72 kg/m.     05/26/2022    8:35 AM 06/29/2021   12:17 PM 05/24/2021    8:31 AM 05/20/2020    8:26 AM 12/12/2019    2:16 PM 11/30/2019   12:48 PM 05/20/2019    8:23 AM  Advanced Directives  Does Patient Have a Medical Advance Directive? No No No No No No No  Would patient like information on creating a medical advance directive?  No - Patient declined Yes (MAU/Ambulatory/Procedural Areas - Information given) Yes (MAU/Ambulatory/Procedural Areas - Information given) No - Patient declined No - Patient declined Yes (MAU/Ambulatory/Procedural Areas - Information given)    Current Medications (verified) Outpatient Encounter Medications as of 05/26/2022  Medication Sig   acetaminophen (TYLENOL) 325 MG tablet Take 650 mg by mouth every 6 (six) hours as needed.    albuterol (VENTOLIN HFA) 108 (90 Base) MCG/ACT inhaler Inhale 2 puffs into the lungs every 6 (six) hours as needed for wheezing or shortness of breath.   aspirin EC 81 MG EC tablet Take 1 tablet (81 mg total) by mouth daily.   atorvastatin (LIPITOR) 40 MG tablet Take 1 tablet (40 mg total) by mouth daily.   gabapentin (NEURONTIN) 100 MG capsule Take 1 capsule (100 mg total) by mouth daily as needed.   metoprolol succinate (TOPROL-XL) 25 MG 24 hr tablet TAKE 1/2 TABLET(12.5 MG) BY MOUTH DAILY   tiZANidine (ZANAFLEX) 4 MG tablet Take 1 tablet (4 mg total) by mouth every 6 (six) hours as needed for muscle spasms.   No facility-administered encounter medications on file as of 05/26/2022.     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   Stroke [facility])    Tobacco abuse    Past Surgical History:  Procedure Laterality Date   COLONOSCOPY WITH PROPOFOL N/A 09/22/2014   Procedure: COLONOSCOPY WITH PROPOFOL;  Surgeon: Midge Minium, MD;  Location: [facility] ENDOSCOPY;  Service: Endoscopy;  Laterality: N/A;   COLONOSCOPY WITH PROPOFOL N/A 07/09/2018   Procedure: COLONOSCOPY WITH PROPOFOL;  Surgeon: Midge Minium, MD;  Location: [facility] ENDOSCOPY;  Service: Endoscopy;  Laterality: N/A;   DEEP NECK LYMPH NODE BIOPSY / EXCISION Right    HERNIA REPAIR  1988  LEFT HEART CATH AND CORONARY ANGIOGRAPHY N/A 12/01/2019   Procedure: LEFT HEART CATH AND CORONARY ANGIOGRAPHY;  Surgeon: Iran Ouch, MD;  Location: [facility] INVASIVE CV LAB;  Service: Cardiovascular;  Laterality: N/A;   LYMPH NODE BIOPSY     NM MYOVIEW ([facility] HX)  10/09/2013   Dr. Dorothyann Peng: EF 58%. Normal wall motion. No ischemia or infarction.   shoulder     SHOULDER ARTHROSCOPY Right    TRANSTHORACIC ECHOCARDIOGRAM  09/24/2013   EF 45-50% with mild global hypokinesis. GR 1 DD. No valvular lesions besides mild TR. Normal RV  pressures.   Family History  Problem Relation Age of Onset   Alcohol abuse Mother    Healthy Daughter    Parkinson's disease Other    Stroke Other    Diabetes Sister    Heart disease Other    Heart disease Other    Alcohol abuse Brother    Cancer Paternal Grandfather        pancreatic   Heart disease Sister    Heart disease Sister    Healthy Son    Healthy Daughter    Bladder Cancer Neg Hx    Kidney cancer Neg Hx    Social History   Socioeconomic History   Marital status: Married    Spouse name: Meriam Sprague   Number of children: 3   Years of education: some college   Highest education level: 12th grade  Occupational History   Occupation: Retired  Tobacco Use   Smoking status: Some Days    Packs/day: 0.15    Years: 56.00    Additional pack years: 0.00    Total pack years: 8.40    Types: Cigarettes    Start date: 1964   Smokeless tobacco: Never   Tobacco comments:    New quit date 02/24/20 - made 02/05/20; currently smoking 3 cigs per week 05/20/20  Vaping Use   Vaping Use: Never used  Substance and Sexual Activity   Alcohol use: No   Drug use: No   Sexual activity: Not Currently    Partners: Female  Other Topics Concern   Not on file  Social History Narrative   Current tobacco user,3 cigarettes per week, previously 2 packs per day for 52 years, formerly worked as a Naval architect, has dogs at home.   Social Determinants of Health   Financial Resource Strain: Low Risk  (05/26/2022)   Overall Financial Resource Strain (CARDIA)    Difficulty of Paying Living Expenses: Not very hard  Food Insecurity: No Food Insecurity (05/26/2022)   Hunger Vital Sign    Worried About Running Out of Food in the Last Year: Never true    Ran Out of Food in the Last Year: Never true  Transportation Needs: No Transportation Needs (05/26/2022)   PRAPARE - Administrator, Civil Service (Medical): No    Lack of Transportation (Non-Medical): No  Physical Activity: Inactive  (05/26/2022)   Exercise Vital Sign    Days of Exercise per Week: 0 days    Minutes of Exercise per Session: 0 min  Stress: Stress Concern Present (05/26/2022)   [employer] of Occupational Health - Occupational Stress Questionnaire    Feeling of Stress : Rather much  Social Connections: Socially Integrated (05/26/2022)   Social Connection and Isolation Panel [NHANES]    Frequency of Communication with Friends and Family: More than three times a week    Frequency of Social Gatherings with Friends and Family: Once a week  Attends Religious Services: More than 4 times per year    Active Member of Clubs or Organizations: Yes    Attends Banker Meetings: 1 to 4 times per year    Marital Status: Married    Tobacco Counseling Ready to quit: Yes Counseling given: Not Answered Tobacco comments: New quit date 02/24/20 - made 02/05/20; currently smoking 3 cigs per week 05/20/20  Clinical Intake:  Pre-visit preparation completed: Yes  Pain : No/denies pain    BMI - recorded: 16.72 Nutritional Status: BMI <19  Underweight Nutritional Risks: None Diabetes: No  How often do you need to have someone help you when you read instructions, pamphlets, or other written materials from your doctor or pharmacy?: 1 - Never  Diabetic?no  Interpreter Needed?: No  Comments: lives with wife Information entered by :: B.Francine Hannan,LPN   Activities of Daily Living    05/26/2022    8:36 AM 03/29/2022    8:41 AM  In your present state of health, do you have any difficulty performing the following activities:  Hearing? 0 0  Vision? 0 0  Difficulty concentrating or making decisions? 0 0  Walking or climbing stairs? 1 0  Dressing or bathing? 0 0  Doing errands, shopping? 0 0  Preparing Food and eating ? N   Using the Toilet? N   In the past six months, have you accidently leaked urine? N   Do you have problems with loss of bowel control? N   Managing your Medications? N   Managing  your Finances? N   Housekeeping or managing your Housekeeping? N     Patient Care Team: Margarita Mail, DO as PCP - General (Internal Medicine) Iran Ouch, MD as Consulting Physician (Cardiology) [place], [place], [place] as Social Worker  Indicate any recent Medical Services you may have received from other than [facility] providers in the past year (date may be approximate).     Assessment:   This is a routine wellness examination for Aldo.  Hearing/Vision screen Hearing Screening - Comments:: Adequate hearing Vision Screening - Comments:: Adequate vision w/glasses [facility]  Dietary issues and exercise activities discussed: Current Exercise Habits: The patient does not participate in regular exercise at present, Exercise limited by: cardiac condition(s);respiratory conditions(s)   Goals Addressed             This Visit's Progress    DIET - INCREASE CALORIC INTAKE   Not on track    Recommend to increase protein intake and to focus on eating 3 healthy meals and at least 2 healthy snacks per day.     DIET - INCREASE WATER INTAKE   On track    Recommend increasing water intake to 6-8 glasses per day        Depression Screen    05/26/2022    8:27 AM 03/29/2022    8:41 AM 11/03/2021   10:31 AM 09/28/2021    8:30 AM 07/04/2021    1:57 PM 05/24/2021    8:29 AM 03/24/2021    9:49 AM  PHQ 2/9 Scores  PHQ - 2 Score 1 0 0 0 0 0 0  PHQ- 9 Score 7 0 0 0 0 3 0    Fall Risk    05/26/2022    8:23 AM 03/29/2022    8:38 AM 11/03/2021   10:27 AM 09/28/2021    8:24 AM 07/04/2021    1:57 PM  Fall Risk   Falls in the past year? 1 0 0 0 0  Number falls in past yr: 0 0 0 0 0  Injury with Fall? 0 0 0 0 0  Risk for fall due to : No Fall Risks      Follow up Education provided;Falls prevention discussed        FALL RISK PREVENTION PERTAINING TO THE HOME:  Any stairs in or around the home? Yes  If so, are there any without handrails? Yes  Home free of loose throw rugs in  walkways, pet beds, electrical cords, etc? Yes  Adequate lighting in your home to reduce risk of falls? Yes   ASSISTIVE DEVICES UTILIZED TO PREVENT FALLS:  Life alert? No  Use of a cane, walker or w/c? Yes cane;sometimes walker with flare ups from [facility] Grab bars in the bathroom? Yes  Shower chair or bench in shower? No  Elevated toilet seat or a handicapped toilet? No   TIMED UP AND GO:  Was the test performed? Yes .  Length of time to ambulate 10 feet: 8 sec.   Gait steady and fast without use of assistive device  Cognitive Function:        05/26/2022    8:46 AM 05/20/2020    8:29 AM 05/20/2019    8:26 AM 05/09/2018   11:38 AM 05/10/2017   11:17 AM  6CIT Screen  What Year? 0 points 0 points 0 points 0 points 0 points  What month? 0 points 0 points 0 points 0 points 0 points  What time? 0 points 0 points 0 points 0 points 3 points  Count back from 20 0 points 0 points 0 points 0 points 0 points  Months in reverse 0 points 2 points 0 points 0 points 0 points  Repeat phrase 0 points 0 points 2 points 0 points 0 points  Total Score 0 points 2 points 2 points 0 points 3 points    Immunizations Immunization History  Administered Date(s) Administered   Pneumococcal Conjugate-13 07/25/2013   Td 02/18/2003   Tdap 01/09/2009    TDAP status: Up to date  Flu Vaccine status: Declined, Education has been provided regarding the importance of this vaccine but patient still declined. Advised may receive this vaccine at local pharmacy or Health Dept. Aware to provide a copy of the vaccination record if obtained from local pharmacy or Health Dept. Verbalized acceptance and understanding.  Pneumococcal vaccine status: Up to date  Covid-19 vaccine status: Declined, Education has been provided regarding the importance of this vaccine but patient still declined. Advised may receive this vaccine at local pharmacy or Health Dept.or vaccine clinic. Aware to provide a copy of the  vaccination record if obtained from local pharmacy or Health Dept. Verbalized acceptance and understanding.  Qualifies for Shingles Vaccine? Yes   Zostavax completed No   Shingrix Completed?: No.    Education has been provided regarding the importance of this vaccine. Patient has been advised to call insurance company to determine out of pocket expense if they have not yet received this vaccine. Advised may also receive vaccine at local pharmacy or Health Dept. Verbalized acceptance and understanding.  Screening Tests Health Maintenance  Topic Date Due   COVID-19 Vaccine (1) Never done   Pneumonia Vaccine 35+ Years old (2 of 2 - PPSV23 or PCV20) 09/19/2013   Zoster Vaccines- Shingrix (1 of 2) 06/29/2022 (Originally 09/11/1996)   Medicare Annual Wellness (AWV)  05/26/2023   COLONOSCOPY (Pts 45-26yrs Insurance coverage will need to be confirmed)  07/09/2023   Hepatitis C Screening  Completed  HPV VACCINES  Aged Out   DTaP/Tdap/Td  Discontinued   INFLUENZA VACCINE  Discontinued    Health Maintenance  Health Maintenance Due  Topic Date Due   COVID-19 Vaccine (1) Never done   Pneumonia Vaccine 78+ Years old (2 of 2 - PPSV23 or PCV20) 09/19/2013    Colorectal cancer screening: No longer required.   Lung Cancer Screening: (Low Dose CT Chest recommended if Age 74-80 years, 30 pack-year currently smoking OR have quit w/in 15years.) does qualify.   Lung Cancer Screening Referral: yes  Additional Screening:  Hepatitis C Screening: does not qualify; Completed yes  Vision Screening: Recommended annual ophthalmology exams for early detection of glaucoma and other disorders of the eye. Is the patient up to date with their annual eye exam?  Yes  Who is the provider or what is the name of the office in which the patient attends annual eye exams? [facility] If pt is not established with a provider, would they like to be referred to a provider to establish care? No .   Dental Screening:  Recommended annual dental exams for proper oral hygiene  Community Resource Referral / Chronic Care Management: CRR required this visit?  No   CCM required this visit?  No      Plan:     I have personally reviewed and noted the following in the patient's chart:   Medical and social history Use of alcohol, tobacco or illicit drugs  Current medications and supplements including opioid prescriptions. Patient is not currently taking opioid prescriptions. Functional ability and status Nutritional status Physical activity Advanced directives List of other physicians Hospitalizations, surgeries, and ER visits in previous 12 months Vitals Screenings to include cognitive, depression, and falls Referrals and appointments  In addition, I have reviewed and discussed with patient certain preventive protocols, quality metrics, and best practice recommendations. A written personalized care plan for preventive services as well as general preventive health recommendations were provided to patient.     Sue Lush, LPN   1/61/0960   Nurse Notes: Pt and his wife is having a number of concerns right now. His wife texts problems pt is having and he concurs. He reports experiencing worsening SOB upon exertion in evening, no appetite or energy and leg pains. Pt relays he still smokes about 4 cigarettes a week. He relays he does not eat much and has loss weight. Pt next appt with provider is September.   *Referral to Pulmonology for Lung Ca screening made *I scheduled appt w/PCP next week to eval wt loss;loss of appetite and SOB

## 2022-05-26 NOTE — Patient Instructions (Signed)
Brent [name] , Thank you for taking time to come for your Medicare Wellness Visit. I appreciate your ongoing commitment to your health goals. Please review the following plan we discussed and let me know if I can assist you in the future.   These are the goals we discussed:  Goals      DIET - INCREASE CALORIC INTAKE     Recommend to increase protein intake and to focus on eating 3 healthy meals and at least 2 healthy snacks per day.     DIET - INCREASE WATER INTAKE     Recommend increasing water intake to 6-8 glasses per day      Find Help in My Community     Timeframe:  Long-Range Goal Priority:  Medium Start Date:      06/21/20                       Expected End Date:  08/23/20                   Follow Up Date 08/23/20   - begin a notebook of services in my neighborhood or community - call 211 when I need some help - follow-up on any referrals for help I am given - think ahead to make sure my need does not become an emergency - have a back-up plan - make a list of family or friends that I can call  -complete food stamp application and return to the Department of Social [facility] for processing    Why is this important?   Knowing how and where to find help for yourself or family in your neighborhood and community is an important skill.  You will want to take some steps to learn how.    Notes:         This is a list of the screening recommended for you and due dates:  Health Maintenance  Topic Date Due   COVID-19 Vaccine (1) Never done   Pneumonia Vaccine (2 of 2 - PPSV23 or PCV20) 09/19/2013   Zoster (Shingles) Vaccine (1 of 2) 06/29/2022*   Medicare Annual Wellness Visit  05/26/2023   Colon Cancer Screening  07/09/2023   Hepatitis C Screening: USPSTF Recommendation to screen - Ages 18-79 yo.  Completed   HPV Vaccine  Aged Out   DTaP/Tdap/Td vaccine  Discontinued   Flu Shot  Discontinued  *Topic was postponed. The date shown is not the original due  date.    Advanced directives: no  Conditions/risks identified: low falls risk  Next appointment: Follow up in one year for your annual wellness visit. 05/31/2023 @ 8:45am in person  Preventive Care 65 Years and Older, Male  Preventive care refers to lifestyle choices and visits with your health care provider that can promote health and wellness. What does preventive care include? A yearly physical exam. This is also called an annual well check. Dental exams once or twice a year. Routine eye exams. Ask your health care provider how often you should have your eyes checked. Personal lifestyle choices, including: Daily care of your teeth and gums. Regular physical activity. Eating a healthy diet. Avoiding tobacco and drug use. Limiting alcohol use. Practicing safe sex. Taking low doses of aspirin every day. Taking vitamin and mineral supplements as recommended by your health care provider. What happens during an annual well check? The services and screenings done by your health care provider during your annual well check will depend on your  age, overall health, lifestyle risk factors, and family history of disease. Counseling  Your health care provider may ask you questions about your: Alcohol use. Tobacco use. Drug use. Emotional well-being. Home and relationship well-being. Sexual activity. Eating habits. History of falls. Memory and ability to understand (cognition). Work and work Astronomer. Screening  You may have the following tests or measurements: Height, weight, and BMI. Blood pressure. Lipid and cholesterol levels. These may be checked every 5 years, or more frequently if you are over 64 years old. Skin check. Lung cancer screening. You may have this screening every year starting at age 76 if you have a 30-pack-year history of smoking and currently smoke or have quit within the past 15 years. Fecal occult blood test (FOBT) of the stool. You may have this test  every year starting at age 59. Flexible sigmoidoscopy or colonoscopy. You may have a sigmoidoscopy every 5 years or a colonoscopy every 10 years starting at age 61. Prostate cancer screening. Recommendations will vary depending on your family history and other risks. Hepatitis C blood test. Hepatitis B blood test. Sexually transmitted disease (STD) testing. Diabetes screening. This is done by checking your blood sugar (glucose) after you have not eaten for a while (fasting). You may have this done every 1-3 years. Abdominal aortic aneurysm (AAA) screening. You may need this if you are a current or former smoker. Osteoporosis. You may be screened starting at age 21 if you are at high risk. Talk with your health care provider about your test results, treatment options, and if necessary, the need for more tests. Vaccines  Your health care provider may recommend certain vaccines, such as: Influenza vaccine. This is recommended every year. Tetanus, diphtheria, and acellular pertussis (Tdap, Td) vaccine. You may need a Td booster every 10 years. Zoster vaccine. You may need this after age 82. Pneumococcal 13-valent conjugate (PCV13) vaccine. One dose is recommended after age 75. Pneumococcal polysaccharide (PPSV23) vaccine. One dose is recommended after age 50. Talk to your health care provider about which screenings and vaccines you need and how often you need them. This information is not intended to replace advice given to you by your health care provider. Make sure you discuss any questions you have with your health care provider. Document Released: 01/22/2015 Document Revised: 09/15/2015 Document Reviewed: 10/27/2014 Elsevier Interactive Patient Education  2017 [employer].  Fall Prevention in the Home Falls can cause injuries. They can happen to people of all ages. There are many things you can do to make your home safe and to help prevent falls. What can I do on the outside of my  home? Regularly fix the edges of walkways and driveways and fix any cracks. Remove anything that might make you trip as you walk through a door, such as a raised step or threshold. Trim any bushes or trees on the path to your home. Use bright outdoor lighting. Clear any walking paths of anything that might make someone trip, such as rocks or tools. Regularly check to see if handrails are loose or broken. Make sure that both sides of any steps have handrails. Any raised decks and porches should have guardrails on the edges. Have any leaves, snow, or ice cleared regularly. Use sand or salt on walking paths during winter. Clean up any spills in your garage right away. This includes oil or grease spills. What can I do in the bathroom? Use night lights. Install grab bars by the toilet and in the tub and shower.  Do not use towel bars as grab bars. Use non-skid mats or decals in the tub or shower. If you need to sit down in the shower, use a plastic, non-slip stool. Keep the floor dry. Clean up any water that spills on the floor as soon as it happens. Remove soap buildup in the tub or shower regularly. Attach bath mats securely with double-sided non-slip rug tape. Do not have throw rugs and other things on the floor that can make you trip. What can I do in the bedroom? Use night lights. Make sure that you have a light by your bed that is easy to reach. Do not use any sheets or blankets that are too big for your bed. They should not hang down onto the floor. Have a firm chair that has side arms. You can use this for support while you get dressed. Do not have throw rugs and other things on the floor that can make you trip. What can I do in the kitchen? Clean up any spills right away. Avoid walking on wet floors. Keep items that you use a lot in easy-to-reach places. If you need to reach something above you, use a strong step stool that has a grab bar. Keep electrical cords out of the way. Do  not use floor polish or wax that makes floors slippery. If you must use wax, use non-skid floor wax. Do not have throw rugs and other things on the floor that can make you trip. What can I do with my stairs? Do not leave any items on the stairs. Make sure that there are handrails on both sides of the stairs and use them. Fix handrails that are broken or loose. Make sure that handrails are as long as the stairways. Check any carpeting to make sure that it is firmly attached to the stairs. Fix any carpet that is loose or worn. Avoid having throw rugs at the top or bottom of the stairs. If you do have throw rugs, attach them to the floor with carpet tape. Make sure that you have a light switch at the top of the stairs and the bottom of the stairs. If you do not have them, ask someone to add them for you. What else can I do to help prevent falls? Wear shoes that: Do not have high heels. Have rubber bottoms. Are comfortable and fit you well. Are closed at the toe. Do not wear sandals. If you use a stepladder: Make sure that it is fully opened. Do not climb a closed stepladder. Make sure that both sides of the stepladder are locked into place. Ask someone to hold it for you, if possible. Clearly mark and make sure that you can see: Any grab bars or handrails. First and last steps. Where the edge of each step is. Use tools that help you move around (mobility aids) if they are needed. These include: Canes. Walkers. Scooters. Crutches. Turn on the lights when you go into a dark area. Replace any light bulbs as soon as they burn out. Set up your furniture so you have a clear path. Avoid moving your furniture around. If any of your floors are uneven, fix them. If there are any pets around you, be aware of where they are. Review your medicines with your doctor. Some medicines can make you feel dizzy. This can increase your chance of falling. Ask your doctor what other things that you can do to  help prevent falls. This information is not intended to  replace advice given to you by your health care provider. Make sure you discuss any questions you have with your health care provider. Document Released: 10/22/2008 Document Revised: 06/03/2015 Document Reviewed: 01/30/2014 Elsevier Interactive Patient Education  2017 [employer].

## 2022-06-01 NOTE — Progress Notes (Signed)
Established Patient Office Visit  Subjective   Patient ID: Brent [name], male    DOB: [birth year]  Age: 77 y.o. MRN: [id number]  Chief Complaint  Patient presents with   Weight Loss    With no appetite    HPI  Patient is here for to discuss weight loss and decreased appetite. He is here with his wife. Patient states over the past few months his appetite has been decreased and his clothes are fitting more loose. He does not weight at home but can tell he is losing weight. States his appetite has decreased but not sure why. Usually eats 2 medium sized meals a day. Eats breakfast around 10-11 am and usually has some kind of cereal with milk and sausage. He usually completely finishes this meal. He will then eat an early dinner around 5 pm that usually consists of a protein with a vegetable, something like pork chops, chicken and rice, etc. He usually finishes this meal as well. Sometimes eats a snack in the evening, something like potato chips. States he feels fuller faster and is eating less. Denies abdominal pain, acid reflux, nausea, vomiting, diarrhea. Has noticed more shortness of breath lately, states he will get winded and not be able to complete activities/projects like he normally does. He does have COPD, only has an Albuterol inhaler to use as needed. Used this about twice in the last 6 months. Has noticed more wheezing lately as well, no coughing. Does smoke 2-3 cigarettes a day. Is up to date with labs and cancer screenings.   Health Maintenance: -Blood work UTD -Colonoscopy 06/2018, repeat in 5 years -Lung cancer screening UTD 7/23 -History of GBS with Prevnar in 2015   Review of Systems  Constitutional:  Positive for weight loss. Negative for chills and fever.  Eyes:  Negative for blurred vision.  Respiratory:  Positive for shortness of breath and wheezing. Negative for cough and sputum production.   Cardiovascular:  Negative for chest pain.  Gastrointestinal:  Negative  for abdominal pain, constipation, diarrhea, heartburn, nausea and vomiting.  Neurological:  Negative for headaches.      Objective:     BP 108/60   Pulse 92   Temp 98 F (36.7 C)   Resp 16   Ht 5\' 9"  (1.753 m)   Wt 116 lb 3.2 oz (52.7 kg)   SpO2 99%   BMI 17.16 kg/m  BP Readings from Last 3 Encounters:  06/02/22 108/60  05/26/22 (!) 102/58  03/29/22 110/72   Wt Readings from Last 3 Encounters:  06/02/22 116 lb 3.2 oz (52.7 kg)  05/26/22 114 lb 14.4 oz (52.1 kg)  03/29/22 119 lb 8 oz (54.2 kg)      Physical Exam Constitutional:      Appearance: Normal appearance.  HENT:     Head: Normocephalic and atraumatic.  Eyes:     Conjunctiva/sclera: Conjunctivae normal.  Cardiovascular:     Rate and Rhythm: Normal rate and regular rhythm.  Pulmonary:     Effort: Pulmonary effort is normal.     Breath sounds: Normal breath sounds.  Skin:    General: Skin is warm and dry.     Comments: Soft, mobile soft tissue lesion on anterior chest wall  Neurological:     General: No focal deficit present.     Mental Status: He is alert. Mental status is at baseline.  Psychiatric:        Mood and Affect: Mood normal.  Behavior: Behavior normal.      No results found for any visits on 06/02/22.  Last CBC Lab Results  Component Value Date   WBC 3.8 03/29/2022   HGB 15.3 03/29/2022   HCT 45.7 03/29/2022   MCV 93.3 03/29/2022   MCH 31.2 03/29/2022   RDW 11.8 03/29/2022   PLT 251 03/29/2022   Last metabolic panel Lab Results  Component Value Date   GLUCOSE 89 03/29/2022   NA 139 03/29/2022   K 5.1 03/29/2022   CL 102 03/29/2022   CO2 28 03/29/2022   BUN 16 03/29/2022   CREATININE 0.84 03/29/2022   GFRNONAA >60 06/29/2021   CALCIUM 10.1 03/29/2022   PROT 7.6 03/29/2022   ALBUMIN 4.5 03/27/2016   BILITOT 0.5 03/29/2022   ALKPHOS 64 03/27/2016   AST 39 (H) 03/29/2022   ALT 36 03/29/2022   ANIONGAP 7 06/29/2021   Last lipids Lab Results  Component Value  Date   CHOL 121 03/29/2022   HDL 61 03/29/2022   LDLCALC 46 03/29/2022   TRIG 49 03/29/2022   CHOLHDL 2.0 03/29/2022   Last hemoglobin A1c Lab Results  Component Value Date   HGBA1C 5.4 03/29/2022   Last thyroid functions Lab Results  Component Value Date   TSH 1.766 11/30/2019   Last vitamin D No results found for: "25OHVITD2", "25OHVITD3", "VD25OH" Last vitamin B12 and Folate No results found for: "VITAMINB12", "FOLATE"    The ASCVD Risk score (Arnett DK, et al., 2019) failed to calculate for the following reasons:   The patient has a prior MI or stroke diagnosis    Assessment & Plan:   1. Weight loss, unintentional/Decreased appetite: Has lost about 3-5 pounds according to our scale here since March. Patient not weighing at home and is eating 2 full meals a day, no abdominal symptoms. At this point I do think he is expending more energy due to uncontrolled COPD. Change COPD treatment, will have him follow up here in 1 month to recheck weight and potentially talk about prescribing at appetite stimulant like Remeron.   2. Centrilobular emphysema (HCC): Sample of Breztri given today, will prescribe Advair to use as a daily maintenance inhaler. Continue Albuterol as needed.  - fluticasone-salmeterol (ADVAIR) 100-50 MCG/ACT AEPB; Inhale 1 puff into the lungs 2 (two) times daily.  Dispense: 1 each; Refill: 3  3. Hypercholesteremia: Stable, Lipitor needing refilled.   - atorvastatin (LIPITOR) 40 MG tablet; Take 1 tablet (40 mg total) by mouth daily.  Dispense: 90 tablet; Refill: 3  4. Lipoma of torso: Soft tissue lesion consistent with lipoma but patient is worried, will confirm with [place].   - [place] CHEST SOFT TISSUE; Future   Return in about 4 weeks (around 06/30/2022).    Margarita Mail, DO

## 2022-06-02 ENCOUNTER — Encounter: Payer: Self-pay | Admitting: Internal Medicine

## 2022-06-02 ENCOUNTER — Ambulatory Visit (INDEPENDENT_AMBULATORY_CARE_PROVIDER_SITE_OTHER): Payer: Medicare PPO | Admitting: Internal Medicine

## 2022-06-02 VITALS — BP 108/60 | HR 92 | Temp 98.0°F | Resp 16 | Ht 69.0 in | Wt 116.2 lb

## 2022-06-02 DIAGNOSIS — R634 Abnormal weight loss: Secondary | ICD-10-CM

## 2022-06-02 DIAGNOSIS — D171 Benign lipomatous neoplasm of skin and subcutaneous tissue of trunk: Secondary | ICD-10-CM

## 2022-06-02 DIAGNOSIS — R630 Anorexia: Secondary | ICD-10-CM

## 2022-06-02 DIAGNOSIS — E7800 Pure hypercholesterolemia, unspecified: Secondary | ICD-10-CM | POA: Diagnosis not present

## 2022-06-02 DIAGNOSIS — J432 Centrilobular emphysema: Secondary | ICD-10-CM | POA: Diagnosis not present

## 2022-06-02 MED ORDER — ATORVASTATIN CALCIUM 40 MG PO TABS: 40.0000 mg | ORAL_TABLET | Freq: Every day | ORAL | 3 refills | Status: DC

## 2022-06-02 MED ORDER — FLUTICASONE-SALMETEROL 100-50 MCG/ACT IN AEPB: 1.0000 | INHALATION_SPRAY | Freq: Two times a day (BID) | RESPIRATORY_TRACT | 3 refills | Status: DC

## 2022-06-06 ENCOUNTER — Other Ambulatory Visit: Payer: Self-pay | Admitting: Internal Medicine

## 2022-06-06 DIAGNOSIS — I10 Essential (primary) hypertension: Secondary | ICD-10-CM

## 2022-06-07 NOTE — Telephone Encounter (Signed)
Rx 03/29/22 #90 1RF- patient takes 1/2 tablet- so this is 1 yr supply Requested Prescriptions  Pending Prescriptions Disp Refills   metoprolol succinate (TOPROL-XL) 25 MG 24 hr tablet [Pharmacy Med Name: METOPROLOL ER SUCCINATE 25MG  TABS] 30 tablet     Sig: TAKE 1/2 TABLET(12.5 MG) BY MOUTH DAILY     Cardiovascular:  Beta Blockers Passed - 06/06/2022  3:35 PM      Passed - Last BP in normal range    BP Readings from Last 1 Encounters:  06/02/22 108/60         Passed - Last Heart Rate in normal range    Pulse Readings from Last 1 Encounters:  06/02/22 92         Passed - Valid encounter within last 6 months    Recent Outpatient Visits           5 days ago Weight loss, unintentional   [facility] Margarita Mail, DO   2 months ago Primary hypertension   [facility] Margarita Mail, DO   7 months ago Strain of lumbar region, initial encounter   [facility] Margarita Mail, DO   8 months ago Hypercholesteremia   [facility] Margarita Mail, DO   11 months ago COVID-19   [facility] Margarita Mail, DO       Future Appointments             In 3 weeks Margarita Mail, DO [facility]   In 3 months Margarita Mail, DO [facility], [facility]

## 2022-06-09 ENCOUNTER — Ambulatory Visit
Admission: RE | Admit: 2022-06-09 | Discharge: 2022-06-09 | Disposition: A | Discharge status: Home / Self Care | Payer: Medicare PPO | Source: Ambulatory Visit | Attending: Internal Medicine | Admitting: Internal Medicine

## 2022-06-09 DIAGNOSIS — D171 Benign lipomatous neoplasm of skin and subcutaneous tissue of trunk: Secondary | ICD-10-CM | POA: Insufficient documentation

## 2022-06-09 DIAGNOSIS — R222 Localized swelling, mass and lump, trunk: Secondary | ICD-10-CM | POA: Diagnosis not present

## 2022-06-27 NOTE — Progress Notes (Unsigned)
Established Patient Office Visit  Subjective   Patient ID: Brent [name], male    DOB: [birth year]  Age: 77 y.o. MRN: [id number]  No chief complaint on file.   HPI  Patient is here for follow up on chronic medical conditions and weight loss. At [facility], we discussed a 3-5 pound weight loss since March. He was given Breztri sample and prescribed Advair for COPD to help with energy losses due to COPD.   HTN/Hx of NSTEMI/CAD/HLD: -NSTEMI 11/2019 -Medications: Metoprolol 12.5 mg, Lipitor 40 mg, aspirin -Patient is compliant with above medications and reports no side effects. -Checks blood pressure at home: averaging 120-130/80  -Following with Cardiology - last seen 05/19/21 -Last lipid panel: Lipid Panel     Component Value Date/Time   CHOL 121 03/29/2022 0915   CHOL 158 07/29/2015 1241   TRIG 49 03/29/2022 0915   HDL 61 03/29/2022 0915   HDL 47 07/29/2015 1241   CHOLHDL 2.0 03/29/2022 0915   VLDL 5 12/01/2019 0405   LDLCALC 46 03/29/2022 0915   LABVLDL 40 07/29/2015 1241   COPD: -COPD status: stable -Current medications: Advair, Albuterol PRN -Satisfied with current treatment?: no -Oxygen use: no -Dyspnea frequency: none at rest sometime with activity  -Cough frequency: none -Rescue inhaler frequency:  only had to use twice in last 6 months -Limitation of activity: no -Productive cough: None -Pneumovax: Not up to Date - politely declines all vaccines today -Influenza: Not up to Date -politely declines all vaccines today -Lung cancer screening: screening CT scan 7/23 LUNG-RADS 2  History of Pre-Diabetes: -A1c 8 years ago 6.7%, 5.4% 3/24  Health Maintenance: -Blood work UTD -Colonoscopy 06/2018, repeat in 5 years   Review of Systems  Constitutional:  Negative for chills and fever.  Eyes:  Negative for blurred vision.  Respiratory:  Negative for cough, shortness of breath and wheezing.   Cardiovascular:  Negative for chest pain.  Neurological:  Negative for  headaches.      Objective:     There were no vitals taken for this visit. BP Readings from Last 3 Encounters:  06/02/22 108/60  05/26/22 (!) 102/58  03/29/22 110/72   Wt Readings from Last 3 Encounters:  06/02/22 116 lb 3.2 oz (52.7 kg)  05/26/22 114 lb 14.4 oz (52.1 kg)  03/29/22 119 lb 8 oz (54.2 kg)      Physical Exam Constitutional:      Appearance: Normal appearance.  HENT:     Head: Normocephalic and atraumatic.     Mouth/Throat:     Mouth: Mucous membranes are moist.     Pharynx: Oropharynx is clear.  Eyes:     Conjunctiva/sclera: Conjunctivae normal.  Cardiovascular:     Rate and Rhythm: Normal rate and regular rhythm.  Pulmonary:     Effort: Pulmonary effort is normal.     Breath sounds: Normal breath sounds.  Musculoskeletal:     Right lower leg: No edema.     Left lower leg: No edema.  Skin:    General: Skin is warm and dry.     Findings: Bruising present.  Neurological:     General: No focal deficit present.     Mental Status: He is alert. Mental status is at baseline.  Psychiatric:        Mood and Affect: Mood normal.        Behavior: Behavior normal.      No results found for any visits on 06/29/22.  Last CBC Lab Results  Component Value  Date   WBC 3.8 03/29/2022   HGB 15.3 03/29/2022   HCT 45.7 03/29/2022   MCV 93.3 03/29/2022   MCH 31.2 03/29/2022   RDW 11.8 03/29/2022   PLT 251 03/29/2022   Last metabolic panel Lab Results  Component Value Date   GLUCOSE 89 03/29/2022   NA 139 03/29/2022   K 5.1 03/29/2022   CL 102 03/29/2022   CO2 28 03/29/2022   BUN 16 03/29/2022   CREATININE 0.84 03/29/2022   GFRNONAA >60 06/29/2021   CALCIUM 10.1 03/29/2022   PROT 7.6 03/29/2022   ALBUMIN 4.5 03/27/2016   BILITOT 0.5 03/29/2022   ALKPHOS 64 03/27/2016   AST 39 (H) 03/29/2022   ALT 36 03/29/2022   ANIONGAP 7 06/29/2021   Last lipids Lab Results  Component Value Date   CHOL 121 03/29/2022   HDL 61 03/29/2022   LDLCALC 46  03/29/2022   TRIG 49 03/29/2022   CHOLHDL 2.0 03/29/2022   Last hemoglobin A1c Lab Results  Component Value Date   HGBA1C 5.4 03/29/2022   Last thyroid functions Lab Results  Component Value Date   TSH 1.766 11/30/2019   Last vitamin D No results found for: "25OHVITD2", "25OHVITD3", "VD25OH" Last vitamin B12 and Folate No results found for: "VITAMINB12", "FOLATE"    The ASCVD Risk score (Arnett DK, et al., 2019) failed to calculate for the following reasons:   The patient has a prior MI or stroke diagnosis    Assessment & Plan:   1. Primary hypertension: Chronic and well controlled. Continue Metoprolol 12.5 mg daily, refilled. Labs due. Follow up in 6 months.   - CBC w/Diff/Platelet - COMPLETE METABOLIC PANEL WITH GFR - metoprolol succinate (TOPROL-XL) 25 MG 24 hr tablet; TAKE 1/2 TABLET(12.5 MG) BY MOUTH DAILY  Dispense: 90 tablet; Refill: 1  2. Hypercholesteremia: Recheck cholesterol today. Continue Lipitor 40 mg, aspirin 81 mg.   - Lipid Profile  3. Centrilobular emphysema (HCC): Stable, using rescue inhaler rarely.   4. History of prediabetes: Recheck A1c today.   - HgB A1c  5. 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  - gabapentin (NEURONTIN) 100 MG capsule; Take 1 capsule (100 mg total) by mouth daily as needed.  Dispense: 90 capsule; Refill: 1  6. Prostate cancer screening: Check PSA today.   - PSA   No follow-ups on file.    Margarita Mail, DO

## 2022-06-29 ENCOUNTER — Encounter: Payer: Self-pay | Admitting: Internal Medicine

## 2022-06-29 ENCOUNTER — Ambulatory Visit (INDEPENDENT_AMBULATORY_CARE_PROVIDER_SITE_OTHER): Payer: Medicare PPO | Admitting: Internal Medicine

## 2022-06-29 VITALS — BP 120/64 | HR 98 | Temp 98.1°F | Resp 16 | Ht 69.0 in | Wt 116.4 lb

## 2022-06-29 DIAGNOSIS — R634 Abnormal weight loss: Secondary | ICD-10-CM | POA: Diagnosis not present

## 2022-06-29 DIAGNOSIS — R630 Anorexia: Secondary | ICD-10-CM

## 2022-06-29 MED ORDER — MIRTAZAPINE 15 MG PO TBDP: 15.0000 mg | ORAL_TABLET | Freq: Every day | ORAL | 0 refills | Status: DC

## 2022-08-04 ENCOUNTER — Ambulatory Visit
Admission: RE | Admit: 2022-08-04 | Discharge: 2022-08-04 | Disposition: A | Discharge status: Home / Self Care | Payer: Medicare PPO | Source: Ambulatory Visit | Attending: Internal Medicine | Admitting: Internal Medicine

## 2022-08-04 DIAGNOSIS — Z122 Encounter for screening for malignant neoplasm of respiratory organs: Secondary | ICD-10-CM

## 2022-08-04 DIAGNOSIS — Z87891 Personal history of nicotine dependence: Secondary | ICD-10-CM

## 2022-08-04 DIAGNOSIS — F17210 Nicotine dependence, cigarettes, uncomplicated: Secondary | ICD-10-CM | POA: Diagnosis not present

## 2022-08-08 NOTE — Progress Notes (Signed)
Pt.notified

## 2022-08-09 NOTE — Progress Notes (Deleted)
Established Patient Office Visit  Subjective   Patient ID: Brent [name], male    DOB: 07-04-1945  Age: 77 y.o. MRN: [id number]  No chief complaint on file.   HPI  Patient is here for follow up on chronic medical conditions and weight loss. Previously we discussed a 3-5 pound weight loss since March. Appetite is decreased, will eat 2-3 meals a day and will finish what's on his plate but rarely eats more at a meal or in-between meals. Weight at LOV stable at 116 and he was started on Remeron 15 mg.   HTN/Hx of NSTEMI/CAD/HLD: -NSTEMI 11/2019 -Medications: Metoprolol 12.5 mg, Lipitor 40 mg, aspirin -Patient is compliant with above medications and reports no side effects. -Checks blood pressure at home: averaging 120-130/80  -Following with Cardiology - last seen 05/19/21 -Last lipid panel: Lipid Panel     Component Value Date/Time   CHOL 121 03/29/2022 0915   CHOL 158 07/29/2015 1241   TRIG 49 03/29/2022 0915   HDL 61 03/29/2022 0915   HDL 47 07/29/2015 1241   CHOLHDL 2.0 03/29/2022 0915   VLDL 5 12/01/2019 0405   LDLCALC 46 03/29/2022 0915   LABVLDL 40 07/29/2015 1241   COPD: -COPD status: stable -Current medications: Advair, Albuterol PRN -Satisfied with current treatment?: yes -Oxygen use: no -Dyspnea frequency: none at rest sometime with activity  -Cough frequency: none -Rescue inhaler frequency:  only had to use twice in last 6 months -Limitation of activity: no -Productive cough: None -Pneumovax: Not up to Date - politely declines all vaccines today -Influenza: Not up to Date -politely declines all vaccines today -Lung cancer screening: screening CT scan 7/23 LUNG-RADS 2  History of Pre-Diabetes: -A1c 8 years ago 6.7%, 5.4% 3/24  Health Maintenance: -Blood work UTD -Colonoscopy 06/2018, repeat in 5 years   Review of Systems  Constitutional:  Positive for malaise/fatigue. Negative for chills and fever.  Eyes:  Negative for blurred vision.  Respiratory:   Negative for cough, shortness of breath and wheezing.   Cardiovascular:  Negative for chest pain.  Neurological:  Negative for headaches.      Objective:     There were no vitals taken for this visit. BP Readings from Last 3 Encounters:  06/29/22 120/64  06/02/22 108/60  05/26/22 (!) 102/58   Wt Readings from Last 3 Encounters:  06/29/22 116 lb 6.4 oz (52.8 kg)  06/02/22 116 lb 3.2 oz (52.7 kg)  05/26/22 114 lb 14.4 oz (52.1 kg)      Physical Exam Constitutional:      Appearance: Normal appearance.  HENT:     Head: Normocephalic and atraumatic.  Eyes:     Conjunctiva/sclera: Conjunctivae normal.  Cardiovascular:     Rate and Rhythm: Normal rate and regular rhythm.  Pulmonary:     Effort: Pulmonary effort is normal.     Breath sounds: Normal breath sounds.  Skin:    General: Skin is warm and dry.  Neurological:     General: No focal deficit present.     Mental Status: He is alert. Mental status is at baseline.  Psychiatric:        Mood and Affect: Mood normal.        Behavior: Behavior normal.      No results found for any visits on 08/10/22.  Last CBC Lab Results  Component Value Date   WBC 3.8 03/29/2022   HGB 15.3 03/29/2022   HCT 45.7 03/29/2022   MCV 93.3 03/29/2022   MCH 31.2  03/29/2022   RDW 11.8 03/29/2022   PLT 251 03/29/2022   Last metabolic panel Lab Results  Component Value Date   GLUCOSE 89 03/29/2022   NA 139 03/29/2022   K 5.1 03/29/2022   CL 102 03/29/2022   CO2 28 03/29/2022   BUN 16 03/29/2022   CREATININE 0.84 03/29/2022   GFRNONAA >60 06/29/2021   CALCIUM 10.1 03/29/2022   PROT 7.6 03/29/2022   ALBUMIN 4.5 03/27/2016   BILITOT 0.5 03/29/2022   ALKPHOS 64 03/27/2016   AST 39 (H) 03/29/2022   ALT 36 03/29/2022   ANIONGAP 7 06/29/2021   Last lipids Lab Results  Component Value Date   CHOL 121 03/29/2022   HDL 61 03/29/2022   LDLCALC 46 03/29/2022   TRIG 49 03/29/2022   CHOLHDL 2.0 03/29/2022   Last hemoglobin  A1c Lab Results  Component Value Date   HGBA1C 5.4 03/29/2022   Last thyroid functions Lab Results  Component Value Date   TSH 1.766 11/30/2019   Last vitamin D No results found for: "25OHVITD2", "25OHVITD3", "VD25OH" Last vitamin B12 and Folate No results found for: "VITAMINB12", "FOLATE"    The ASCVD Risk score (Arnett DK, et al., 2019) failed to calculate for the following reasons:   The patient has a prior MI or stroke diagnosis    Assessment & Plan:   1. Decreased appetite/Weight loss, unintentional: Weight stable compared to that 1 month ago but BMI 17 and appetite decreased with decrease energy levels. Not really using Advair, lungs clear today. Discussed starting a daily men's multivitamin and will start Remeron 15 mg to stimulate appetite. Labs normal in March and up to date with cancer screenings. Follow up in 6 weeks.   - mirtazapine (REMERON SOL-TAB) 15 MG disintegrating tablet; Take 1 tablet (15 mg total) by mouth at bedtime.  Dispense: 90 tablet; Refill: 0   No follow-ups on file.    Margarita Mail, DO

## 2022-08-10 ENCOUNTER — Ambulatory Visit: Payer: Medicare PPO | Admitting: Internal Medicine

## 2022-08-14 ENCOUNTER — Other Ambulatory Visit: Payer: Self-pay | Admitting: Acute Care

## 2022-08-14 DIAGNOSIS — Z122 Encounter for screening for malignant neoplasm of respiratory organs: Secondary | ICD-10-CM

## 2022-08-14 DIAGNOSIS — Z87891 Personal history of nicotine dependence: Secondary | ICD-10-CM

## 2022-08-14 DIAGNOSIS — F17210 Nicotine dependence, cigarettes, uncomplicated: Secondary | ICD-10-CM

## 2022-08-30 NOTE — Progress Notes (Unsigned)
Established Patient Office Visit  Subjective   Patient ID: Brent [name], male    DOB: 04-29-1945  Age: 78 y.o. MRN: [id number]  No chief complaint on file.   HPI  Patient is here for follow up on chronic medical conditions and weight loss. Previously we discussed a 3-5 pound weight loss since March. Appetite is decreased, will eat 2-3 meals a day and will finish what's on his plate but rarely eats more at a meal or in-between meals. Weight at LOV stable at 116 and he was started on Remeron 15 mg.   HTN/Hx of NSTEMI/CAD/HLD: -NSTEMI 11/2019 -Medications: Metoprolol 12.5 mg, Lipitor 40 mg, aspirin -Patient is compliant with above medications and reports no side effects. -Checks blood pressure at home: averaging 120-130/80  -Following with Cardiology - last seen 05/19/21 -Last lipid panel: Lipid Panel     Component Value Date/Time   CHOL 121 03/29/2022 0915   CHOL 158 07/29/2015 1241   TRIG 49 03/29/2022 0915   HDL 61 03/29/2022 0915   HDL 47 07/29/2015 1241   CHOLHDL 2.0 03/29/2022 0915   VLDL 5 12/01/2019 0405   LDLCALC 46 03/29/2022 0915   LABVLDL 40 07/29/2015 1241   COPD: -COPD status: stable -Current medications: Advair, Albuterol PRN -Satisfied with current treatment?: yes -Oxygen use: no -Dyspnea frequency: none at rest sometime with activity  -Cough frequency: none -Rescue inhaler frequency:  only had to use twice in last 6 months -Limitation of activity: no -Productive cough: None -Pneumovax: Not up to Date - politely declines all vaccines today -Influenza: Not up to Date -politely declines all vaccines today -Lung cancer screening: screening CT scan 7/23 LUNG-RADS 2  History of Pre-Diabetes: -A1c 8 years ago 6.7%, 5.4% 3/24  Health Maintenance: -Blood work UTD -Colonoscopy 06/2018, repeat in 5 years   Review of Systems  Constitutional:  Positive for malaise/fatigue. Negative for chills and fever.  Eyes:  Negative for blurred vision.  Respiratory:   Negative for cough, shortness of breath and wheezing.   Cardiovascular:  Negative for chest pain.  Neurological:  Negative for headaches.      Objective:     There were no vitals taken for this visit. BP Readings from Last 3 Encounters:  06/29/22 120/64  06/02/22 108/60  05/26/22 (!) 102/58   Wt Readings from Last 3 Encounters:  06/29/22 116 lb 6.4 oz (52.8 kg)  06/02/22 116 lb 3.2 oz (52.7 kg)  05/26/22 114 lb 14.4 oz (52.1 kg)      Physical Exam Constitutional:      Appearance: Normal appearance.  HENT:     Head: Normocephalic and atraumatic.  Eyes:     Conjunctiva/sclera: Conjunctivae normal.  Cardiovascular:     Rate and Rhythm: Normal rate and regular rhythm.  Pulmonary:     Effort: Pulmonary effort is normal.     Breath sounds: Normal breath sounds.  Skin:    General: Skin is warm and dry.  Neurological:     General: No focal deficit present.     Mental Status: He is alert. Mental status is at baseline.  Psychiatric:        Mood and Affect: Mood normal.        Behavior: Behavior normal.      No results found for any visits on 08/31/22.  Last CBC Lab Results  Component Value Date   WBC 3.8 03/29/2022   HGB 15.3 03/29/2022   HCT 45.7 03/29/2022   MCV 93.3 03/29/2022   MCH 31.2  03/29/2022   RDW 11.8 03/29/2022   PLT 251 03/29/2022   Last metabolic panel Lab Results  Component Value Date   GLUCOSE 89 03/29/2022   NA 139 03/29/2022   K 5.1 03/29/2022   CL 102 03/29/2022   CO2 28 03/29/2022   BUN 16 03/29/2022   CREATININE 0.84 03/29/2022   GFRNONAA >60 06/29/2021   CALCIUM 10.1 03/29/2022   PROT 7.6 03/29/2022   ALBUMIN 4.5 03/27/2016   BILITOT 0.5 03/29/2022   ALKPHOS 64 03/27/2016   AST 39 (H) 03/29/2022   ALT 36 03/29/2022   ANIONGAP 7 06/29/2021   Last lipids Lab Results  Component Value Date   CHOL 121 03/29/2022   HDL 61 03/29/2022   LDLCALC 46 03/29/2022   TRIG 49 03/29/2022   CHOLHDL 2.0 03/29/2022   Last hemoglobin  A1c Lab Results  Component Value Date   HGBA1C 5.4 03/29/2022   Last thyroid functions Lab Results  Component Value Date   TSH 1.766 11/30/2019   Last vitamin D No results found for: "25OHVITD2", "25OHVITD3", "VD25OH" Last vitamin B12 and Folate No results found for: "VITAMINB12", "FOLATE"    The ASCVD Risk score (Arnett DK, et al., 2019) failed to calculate for the following reasons:   The patient has a prior MI or stroke diagnosis    Assessment & Plan:   1. Decreased appetite/Weight loss, unintentional: Weight stable compared to that 1 month ago but BMI 17 and appetite decreased with decrease energy levels. Not really using Advair, lungs clear today. Discussed starting a daily men's multivitamin and will start Remeron 15 mg to stimulate appetite. Labs normal in March and up to date with cancer screenings. Follow up in 6 weeks.   - mirtazapine (REMERON SOL-TAB) 15 MG disintegrating tablet; Take 1 tablet (15 mg total) by mouth at bedtime.  Dispense: 90 tablet; Refill: 0   No follow-ups on file.    Margarita Mail, DO

## 2022-08-31 ENCOUNTER — Encounter: Payer: Self-pay | Admitting: Internal Medicine

## 2022-08-31 ENCOUNTER — Ambulatory Visit: Payer: Medicare PPO | Admitting: Internal Medicine

## 2022-08-31 VITALS — BP 120/66 | HR 84 | Temp 97.6°F | Ht 69.0 in | Wt 114.0 lb

## 2022-08-31 DIAGNOSIS — R630 Anorexia: Secondary | ICD-10-CM

## 2022-08-31 DIAGNOSIS — R634 Abnormal weight loss: Secondary | ICD-10-CM | POA: Diagnosis not present

## 2022-08-31 MED ORDER — MIRTAZAPINE 7.5 MG PO TABS: 7.5000 mg | ORAL_TABLET | Freq: Every day | ORAL | 1 refills | Status: DC

## 2022-09-24 ENCOUNTER — Other Ambulatory Visit: Payer: Self-pay | Admitting: Internal Medicine

## 2022-09-24 DIAGNOSIS — R630 Anorexia: Secondary | ICD-10-CM

## 2022-09-24 DIAGNOSIS — R634 Abnormal weight loss: Secondary | ICD-10-CM

## 2022-09-26 NOTE — Telephone Encounter (Signed)
Refused Remeron 15 mg because it was discontinued on 08/31/2022 due to a dose change.   Now on 7.5 mg

## 2022-09-29 ENCOUNTER — Ambulatory Visit: Payer: Medicare PPO | Admitting: Internal Medicine

## 2022-10-09 NOTE — Progress Notes (Unsigned)
Established Patient Office Visit  Subjective   Patient ID: Brent [name], male    DOB: 26-Jul-1945  Age: 77 y.o. MRN: [id number]  No chief complaint on file.   HPI  Patient is here for follow up on chronic medical conditions and weight loss. Has been steadily losing 5-7 pounds since March, lost another 2 pounds since LOV in June. Started on Remeron 15 mg at last office visit, patient took it for 2 days and stopped it because it made him "feel like a zombie". Patient does admit to some recent stress for the last 2-3 weeks and has been eating a little less. Typically eating 2 meals a day with snacks, does finish meals. Had been drinking Ensure but stopped.   HTN/Hx of NSTEMI/CAD/HLD: -NSTEMI 11/2019 -Medications: Metoprolol 12.5 mg, Lipitor 40 mg, aspirin -Patient is compliant with above medications and reports no side effects. -Checks blood pressure at home: averaging 120-130/80  -Following with Cardiology - last seen 05/19/21 -Last lipid panel: Lipid Panel     Component Value Date/Time   CHOL 121 03/29/2022 0915   CHOL 158 07/29/2015 1241   TRIG 49 03/29/2022 0915   HDL 61 03/29/2022 0915   HDL 47 07/29/2015 1241   CHOLHDL 2.0 03/29/2022 0915   VLDL 5 12/01/2019 0405   LDLCALC 46 03/29/2022 0915   LABVLDL 40 07/29/2015 1241   COPD: -COPD status: stable -Current medications: Advair, Albuterol PRN -Satisfied with current treatment?: yes -Oxygen use: no -Dyspnea frequency: none at rest sometime with activity  -Cough frequency: none -Rescue inhaler frequency:  only had to use twice in last 6 months -Limitation of activity: no -Productive cough: None -Pneumovax: Not up to Date - politely declines all vaccines today -Influenza: Not up to Date -politely declines all vaccines today -Lung cancer screening: screening CT scan 7/23 LUNG-RADS 2  History of Pre-Diabetes: -A1c 8 years ago 6.7%, 5.4% 3/24  Health Maintenance: -Blood work UTD -Colonoscopy 06/2018, repeat in 5  years   Review of Systems  Constitutional:  Positive for malaise/fatigue. Negative for chills and fever.  Eyes:  Negative for blurred vision.  Respiratory:  Negative for cough, shortness of breath and wheezing.   Cardiovascular:  Negative for chest pain.  Gastrointestinal:  Negative for abdominal pain, constipation, diarrhea, heartburn, nausea and vomiting.  Neurological:  Negative for headaches.      Objective:     There were no vitals taken for this visit. BP Readings from Last 3 Encounters:  08/31/22 120/66  06/29/22 120/64  06/02/22 108/60   Wt Readings from Last 3 Encounters:  08/31/22 114 lb (51.7 kg)  06/29/22 116 lb 6.4 oz (52.8 kg)  06/02/22 116 lb 3.2 oz (52.7 kg)      Physical Exam Constitutional:      Appearance: Normal appearance.  HENT:     Head: Normocephalic and atraumatic.  Eyes:     Conjunctiva/sclera: Conjunctivae normal.  Cardiovascular:     Rate and Rhythm: Normal rate and regular rhythm.  Pulmonary:     Effort: Pulmonary effort is normal.     Breath sounds: Normal breath sounds.  Skin:    General: Skin is warm and dry.  Neurological:     General: No focal deficit present.     Mental Status: He is alert. Mental status is at baseline.  Psychiatric:        Mood and Affect: Mood normal.        Behavior: Behavior normal.      No results found  for any visits on 10/10/22.  Last CBC Lab Results  Component Value Date   WBC 3.8 03/29/2022   HGB 15.3 03/29/2022   HCT 45.7 03/29/2022   MCV 93.3 03/29/2022   MCH 31.2 03/29/2022   RDW 11.8 03/29/2022   PLT 251 03/29/2022   Last metabolic panel Lab Results  Component Value Date   GLUCOSE 89 03/29/2022   NA 139 03/29/2022   K 5.1 03/29/2022   CL 102 03/29/2022   CO2 28 03/29/2022   BUN 16 03/29/2022   CREATININE 0.84 03/29/2022   GFRNONAA >60 06/29/2021   CALCIUM 10.1 03/29/2022   PROT 7.6 03/29/2022   ALBUMIN 4.5 03/27/2016   BILITOT 0.5 03/29/2022   ALKPHOS 64 03/27/2016   AST  39 (H) 03/29/2022   ALT 36 03/29/2022   ANIONGAP 7 06/29/2021   Last lipids Lab Results  Component Value Date   CHOL 121 03/29/2022   HDL 61 03/29/2022   LDLCALC 46 03/29/2022   TRIG 49 03/29/2022   CHOLHDL 2.0 03/29/2022   Last hemoglobin A1c Lab Results  Component Value Date   HGBA1C 5.4 03/29/2022   Last thyroid functions Lab Results  Component Value Date   TSH 1.766 11/30/2019   Last vitamin D No results found for: "25OHVITD2", "25OHVITD3", "VD25OH" Last vitamin B12 and Folate No results found for: "VITAMINB12", "FOLATE"    The ASCVD Risk score (Arnett DK, et al., 2019) failed to calculate for the following reasons:   The patient has a prior MI or stroke diagnosis    Assessment & Plan:   1. Decreased appetite/Weight loss, unintentional: Continues to lose weight, lost about 2 pounds since LOV. Eating less now due to stress. Up to date on cancer screenings, will be due for colonoscopy next year. Remeron 15 mg caused side effects, will lower dose to 7.5 mg to try to stimulate appetite. Will restart Ensure, 2 a day to increase calories.   - mirtazapine (REMERON) 7.5 MG tablet; Take 1 tablet (7.5 mg total) by mouth at bedtime.  Dispense: 30 tablet; Refill: 1   No follow-ups on file.    Margarita Mail, DO

## 2022-10-10 ENCOUNTER — Encounter: Payer: Self-pay | Admitting: Internal Medicine

## 2022-10-10 ENCOUNTER — Ambulatory Visit: Payer: Medicare PPO | Admitting: Internal Medicine

## 2022-10-10 VITALS — BP 104/60 | HR 89 | Temp 97.7°F | Resp 18 | Ht 69.0 in | Wt 117.8 lb

## 2022-10-10 DIAGNOSIS — I10 Essential (primary) hypertension: Secondary | ICD-10-CM

## 2022-10-10 DIAGNOSIS — J432 Centrilobular emphysema: Secondary | ICD-10-CM

## 2022-10-10 DIAGNOSIS — R634 Abnormal weight loss: Secondary | ICD-10-CM | POA: Diagnosis not present

## 2022-10-10 MED ORDER — FLUTICASONE-SALMETEROL 100-50 MCG/ACT IN AEPB: 1.0000 | INHALATION_SPRAY | Freq: Two times a day (BID) | RESPIRATORY_TRACT | 3 refills | Status: AC

## 2022-10-10 MED ORDER — METOPROLOL SUCCINATE ER 25 MG PO TB24: ORAL_TABLET | ORAL | 1 refills | Status: DC

## 2022-10-19 ENCOUNTER — Encounter: Payer: Self-pay | Admitting: Internal Medicine

## 2022-10-19 ENCOUNTER — Ambulatory Visit: Payer: Self-pay

## 2022-10-19 ENCOUNTER — Telehealth: Payer: Medicare PPO | Admitting: Internal Medicine

## 2022-10-19 VITALS — Ht 69.0 in | Wt 117.0 lb

## 2022-10-19 DIAGNOSIS — U071 COVID-19: Secondary | ICD-10-CM | POA: Diagnosis not present

## 2022-10-19 MED ORDER — BENZONATATE 100 MG PO CAPS: 100.0000 mg | ORAL_CAPSULE | Freq: Two times a day (BID) | ORAL | 0 refills | Status: DC | PRN

## 2022-10-19 MED ORDER — NIRMATRELVIR/RITONAVIR (PAXLOVID)TABLET
3.0000 | ORAL_TABLET | Freq: Two times a day (BID) | ORAL | 0 refills | Status: AC
Start: 2022-10-19 — End: 2022-10-24

## 2022-10-19 MED ORDER — FLUTICASONE PROPIONATE 50 MCG/ACT NA SUSP
2.0000 | Freq: Every day | NASAL | 6 refills | Status: AC
Start: 2022-10-19 — End: ?

## 2022-10-19 NOTE — Progress Notes (Signed)
Virtual Visit via Telephone Note  I connected with Laneta Simmers Schuessler on 10/19/22 at 11:00 AM EDT by telephone and verified that I am speaking with the correct person using two identifiers.  Location: Patient: Home  Provider: [facility]   I discussed the limitations, risks, security and privacy concerns of performing an evaluation and management service by telephone and the availability of in person appointments. I also discussed with the patient that there may be a patient responsible charge related to this service. The patient expressed understanding and agreed to proceed.   History of Present Illness:  Patient is presenting over the phone because he was COVID positive yesterday.  Patient went to a funeral on Sunday which is where he thinks he came in contact with COVID.  He started having symptoms yesterday 10/18/2022 and tested positive via home test yesterday as well.  Symptoms include chills, headache, myalgias and fatigue.  He did have congestion yesterday but states this is improved.  He is also experiencing a dry cough but no shortness of breath or wheezing.  He denies fevers but does endorse some mild sinus pain and ear pain bilaterally.  He denies rhinorrhea or sore throat.  He is currently taking Alka-Seltzer plus over-the-counter for his symptoms.  He does not have any history of COVID immunizations.    Observations/Objective:  General: well sounding Neuro: Answers all questions appropriately  Assessment and Plan:  1. COVID-19: Will prescribe antiviral medication, sent to pharmacy.  For his symptoms recommend cough suppressant and nasal steroids which were also sent to his pharmacy.  Otherwise treat conservatively with rest and hydration.  Also recommend starting vitamin C and zinc to help boost immunity.  Discussed wearing a mask out in public or around other people 5 to 10 days after symptom onset.  Reviewed signs and symptoms of when to present to the emergency room.  If symptoms  worsen or fail to improve patient will schedule a follow-up.  - benzonatate (TESSALON) 100 MG capsule; Take 1 capsule (100 mg total) by mouth 2 (two) times daily as needed for cough.  Dispense: 20 capsule; Refill: 0 - fluticasone (FLONASE) 50 MCG/ACT nasal spray; Place 2 sprays into both nostrils daily.  Dispense: 16 g; Refill: 6 - nirmatrelvir/ritonavir (PAXLOVID) 20 x 150 MG & 10 x 100MG  TABS; Take 3 tablets by mouth 2 (two) times daily for 5 days. (Take nirmatrelvir 150 mg two tablets twice daily for 5 days and ritonavir 100 mg one tablet twice daily for 5 days) Patient GFR is 91.  Dispense: 30 tablet; Refill: 0   Follow Up Instructions: If symptoms worsen or fail to improve.    I discussed the assessment and treatment plan with the patient. The patient was provided an opportunity to ask questions and all were answered. The patient agreed with the plan and demonstrated an understanding of the instructions.   The patient was advised to call back or seek an in-person evaluation if the symptoms worsen or if the condition fails to improve as anticipated.  I provided 10 minutes of non-face-to-face time during this encounter.   Margarita Mail, DO

## 2022-10-19 NOTE — Telephone Encounter (Signed)
   Chief Complaint: COVID + Symptoms: Cough, chills, BA, feels poorly, sore throat. Frequency: Yesterday Pertinent Negatives: Patient denies  Disposition: [] ED /[] Urgent Care (no appt availability in office) / [x] Appointment(In office/virtual)/ []  [facility]/ [] Home Care/ [] Refused Recommended Disposition /[] [facility]/ []  Follow-up with PCP Additional Notes: Pt thinks he may have had an exposure on Sunday at a funeral. Pt started s/s yesterday. Pt states he feels a little better today. Pt is interested in antivirals.  Summary: Covid+   Patient states that he tested positive for Covid yesterday. Patient said the only symptom he is having is stiffness.     Reason for Disposition  [1] HIGH RISK patient (e.g., weak immune system, age > 64 years, obesity with BMI 30 or higher, pregnant, chronic lung disease or other chronic medical condition) AND [2] COVID symptoms (e.g., cough, fever)  (Exceptions: Already seen by PCP and no new or worsening symptoms.)  Answer Assessment - Initial Assessment Questions 1. COVID-19 DIAGNOSIS: "How do you know that you have COVID?" (e.g., positive lab test or self-test, diagnosed by doctor or NP/PA, symptoms after exposure).     Sneezing coughing, Chills feeling bad 2. COVID-19 EXPOSURE: "Was there any known exposure to COVID before the symptoms began?" CDC Definition of close contact: within 6 feet (2 meters) for a total of 15 minutes or more over a 24-hour period.      Sunday at a funeral 3. ONSET: "When did the COVID-19 symptoms start?"      yesterday 4. WORST SYMPTOM: "What is your worst symptom?" (e.g., cough, fever, shortness of breath, muscle aches)     BA 5. COUGH: "Do you have a cough?" If Yes, ask: "How bad is the cough?"       yes 6. FEVER: "Do you have a fever?" If Yes, ask: "What is your temperature, how was it measured, and when did it start?"     chills 7. RESPIRATORY STATUS: "Describe your breathing?" (e.g., normal;  shortness of breath, wheezing, unable to speak)      none 8. BETTER-SAME-WORSE: "Are you getting better, staying the same or getting worse compared to yesterday?"  If getting worse, ask, "In what way?"     better 9. OTHER SYMPTOMS: "Do you have any other symptoms?"  (e.g., chills, fatigue, headache, loss of smell or taste, muscle pain, sore throat)     BA 10. HIGH RISK DISEASE: "Do you have any chronic medical problems?" (e.g., asthma, heart or lung disease, weak immune system, obesity, etc.)       Weakened immunity 11. VACCINE: "Have you had the COVID-19 vaccine?" If Yes, ask: "Which one, how many shots, when did you get it?"       no  Protocols used: Coronavirus (COVID-19) Diagnosed or Suspected-A-AH

## 2022-11-30 ENCOUNTER — Ambulatory Visit (INDEPENDENT_AMBULATORY_CARE_PROVIDER_SITE_OTHER): Payer: Medicare PPO

## 2022-11-30 ENCOUNTER — Ambulatory Visit (INDEPENDENT_AMBULATORY_CARE_PROVIDER_SITE_OTHER): Payer: Medicare PPO | Admitting: Vascular Surgery

## 2022-11-30 DIAGNOSIS — I70213 Atherosclerosis of native arteries of extremities with intermittent claudication, bilateral legs: Secondary | ICD-10-CM

## 2022-11-30 LAB — VAS US ABI WITH/WO TBI
Left ABI: 0.75
Right ABI: 0.97

## 2023-01-11 ENCOUNTER — Encounter: Payer: Self-pay | Admitting: Internal Medicine

## 2023-01-11 ENCOUNTER — Ambulatory Visit: Payer: Medicare PPO | Admitting: Internal Medicine

## 2023-01-11 VITALS — BP 134/86 | HR 84 | Temp 97.7°F | Resp 16 | Ht 69.0 in | Wt 122.2 lb

## 2023-01-11 DIAGNOSIS — E7800 Pure hypercholesterolemia, unspecified: Secondary | ICD-10-CM | POA: Diagnosis not present

## 2023-01-11 DIAGNOSIS — R634 Abnormal weight loss: Secondary | ICD-10-CM | POA: Diagnosis not present

## 2023-01-11 DIAGNOSIS — J432 Centrilobular emphysema: Secondary | ICD-10-CM | POA: Diagnosis not present

## 2023-01-11 DIAGNOSIS — I10 Essential (primary) hypertension: Secondary | ICD-10-CM | POA: Diagnosis not present

## 2023-01-11 DIAGNOSIS — Z8669 Personal history of other diseases of the nervous system and sense organs: Secondary | ICD-10-CM

## 2023-01-11 HISTORY — DX: Essential (primary) hypertension: I10

## 2023-01-11 MED ORDER — GABAPENTIN 100 MG PO CAPS
100.0000 mg | ORAL_CAPSULE | Freq: Every day | ORAL | 1 refills | Status: DC | PRN
Start: 2023-01-11 — End: 2023-08-10

## 2023-01-11 NOTE — Assessment & Plan Note (Signed)
 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

## 2023-01-11 NOTE — Progress Notes (Signed)
 Established Patient Office Visit  Subjective   Patient ID: Brent [name], male    DOB: Sep 21, 1945  Age: 78 y.o. MRN: [id number]  Chief Complaint  Patient presents with   Medical Management of Chronic Issues    HPI  Patient is here for follow up on chronic medical conditions.   Weight Loss: -Had been steadily losing 5-7 pounds since March but today it has improved, actually gained 5 pounds  -Started on Remeron  15 mg previously, patient took it for 2 days and stopped it because it made him feel like a zombie. Dose changed to 7.5 mg which he took about 3 times and stopped taking, not on anything now -Typically eating 2 meals a day with snacks, does finish meals. Had been drinking Ensure.  HTN/Hx of NSTEMI/CAD/HLD: -NSTEMI 11/2019 -Medications: Metoprolol  12.5 mg, Lipitor  40 mg, aspirin  -Patient is compliant with above medications and reports no side effects. -Checks blood pressure at home: averaging 120-130/80  -Following with Cardiology -Last lipid panel: Lipid Panel     Component Value Date/Time   CHOL 121 03/29/2022 0915   CHOL 158 07/29/2015 1241   TRIG 49 03/29/2022 0915   HDL 61 03/29/2022 0915   HDL 47 07/29/2015 1241   CHOLHDL 2.0 03/29/2022 0915   VLDL 5 12/01/2019 0405   LDLCALC 46 03/29/2022 0915   LABVLDL 40 07/29/2015 1241    COPD: -COPD status: stable - had COVID in the fall but recovered well  -Current medications: Advair, Albuterol  PRN -Satisfied with current treatment?: yes -Oxygen use: no -Dyspnea frequency: none at rest sometime with activity  -Cough frequency: none -Rescue inhaler frequency:  rarely  -Limitation of activity: no -Productive cough: None -Pneumovax: Not up to Date - politely declines all vaccines today -Influenza: Not up to Date -politely declines all vaccines today -Lung cancer screening: screening CT scan 7/23 LUNG-RADS 2  History of Pre-Diabetes: -A1c 8 years ago 6.7%, 5.4% 3/24  Hx of Guillain Barre: -Takes  Gabapentin  100 mg once daily for continued sequelae of this -Occurred after taking a vaccine - does not get vaccines now because of this  Health Maintenance: -Blood work UTD -Colonoscopy 06/2018, repeat in 5 years -Patient is a current smoker, underwent lung cancer screening 7/24 which was lung RADS 2  Patient Active Problem List   Diagnosis Date Noted   Weight loss, unintentional 01/11/2023   Primary hypertension 01/11/2023   Atherosclerosis of native arteries of extremity with intermittent claudication (HCC) 12/04/2021   Hypercholesteremia 12/04/2021   Leg pain, right 06/15/2020   Ventricular tachycardia (HCC)    Hypotension    NSTEMI (non-ST elevated myocardial infarction) (HCC) 11/30/2019   Current mild episode of major depressive disorder without prior episode (HCC) 10/29/2019   Chronic midline low back pain without sciatica 10/29/2019   Adenomatous polyp of colon 10/29/2019   Polyp of ascending colon    Atherosclerosis of aorta (HCC) 05/23/2017   Osteopenia 08/29/2016   Weakness 04/03/2016   Underweight 03/27/2016   Cigarette smoker one half pack a day or less 09/24/2015   Incidental lung nodule, > 3mm and < 8mm 08/03/2015   Centrilobular emphysema (HCC) 08/03/2015   Right Supraclavicular Lymph node 08/03/2015   Carotid stenosis 07/29/2015   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   Abnormal chest CT 07/29/2015   Coronary artery calcification seen on CAT scan 07/29/2015   Coronary artery disease involving native coronary artery of native heart with angina pectoris (HCC)    Lymphadenopathy of right cervical region 07/19/2015   Aortic  calcification (HCC) 07/10/2015   Benign neoplasm of transverse colon    Benign neoplasm of descending colon    Benign neoplasm of sigmoid colon    Cataract    Tobacco abuse    History of colonic polyps    Hypochloremia    Low sodium levels 08/18/2013   Back pain without radiation 08/18/2013   Hyposmolality and/or hyponatremia 08/18/2013    History of Guillain-Barre syndrome 08/11/2013   Past Medical History:  Diagnosis Date   Allergy    Aortic calcification (HCC) 07/2015   Noted on chest CT with contrast   Cataract    Centrilobular emphysema (HCC) 08/03/2015   Noted on chest CT July 2017   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   Stroke [facility])    Tobacco abuse    Past Surgical History:  Procedure Laterality Date   COLONOSCOPY WITH PROPOFOL  N/A 09/22/2014   Procedure: COLONOSCOPY WITH PROPOFOL ;  Surgeon: Rogelia Copping, MD;  Location: [facility] ENDOSCOPY;  Service: Endoscopy;  Laterality: N/A;   COLONOSCOPY WITH PROPOFOL  N/A 07/09/2018   Procedure: COLONOSCOPY WITH PROPOFOL ;  Surgeon: Copping Rogelia, MD;  Location: [facility] ENDOSCOPY;  Service: Endoscopy;  Laterality: N/A;   DEEP NECK LYMPH NODE BIOPSY / EXCISION Right    HERNIA REPAIR  1988   LEFT HEART CATH AND CORONARY ANGIOGRAPHY N/A 12/01/2019   Procedure: LEFT HEART CATH AND CORONARY ANGIOGRAPHY;  Surgeon: Darron Deatrice LABOR, MD;  Location: [facility] INVASIVE CV LAB;  Service: Cardiovascular;  Laterality: N/A;   LYMPH NODE BIOPSY     NM MYOVIEW  ([facility] HX)  10/09/2013   Dr. Cara Lovelace: EF 58%. Normal wall motion. No ischemia or infarction.   shoulder     SHOULDER ARTHROSCOPY Right    TRANSTHORACIC ECHOCARDIOGRAM  09/24/2013   EF 45-50% with mild global hypokinesis. GR 1 DD. No valvular lesions besides mild TR. Normal RV pressures.   Social History   Tobacco  Use   Smoking status: Some Days    Current packs/day: 0.15    Average packs/day: 0.2 packs/day for 61.0 years (9.2 ttl pk-yrs)    Types: Cigarettes    Start date: 1964   Smokeless tobacco: Never   Tobacco comments:    New quit date 02/24/20 - made 02/05/20; currently smoking 3 cigs per week 05/20/20  Vaping Use   Vaping status: Never Used  Substance Use Topics   Alcohol use: No   Drug use: No   Social History   Socioeconomic History   Marital status: Married    Spouse name: Rojelio   Number of children: 3   Years of education: some college   Highest education level: 12th grade  Occupational History   Occupation: Retired  Tobacco Use   Smoking status: Some Days    Current packs/day: 0.15    Average packs/day: 0.2 packs/day for 61.0 years (9.2 ttl pk-yrs)    Types: Cigarettes    Start date: 1964   Smokeless tobacco:  Never   Tobacco comments:    New quit date 02/24/20 - made 02/05/20; currently smoking 3 cigs per week 05/20/20  Vaping Use   Vaping status: Never Used  Substance and Sexual Activity   Alcohol use: No   Drug use: No   Sexual activity: Not Currently    Partners: Female  Other Topics Concern   Not on file  Social History Narrative   Current tobacco user,3 cigarettes per week, previously 2 packs per day for 52 years, formerly worked as a naval architect, has dogs at home.   Social Drivers of Health   Financial Resource Strain: Medium Risk (06/01/2022)   Overall Financial Resource Strain (CARDIA)    Difficulty of Paying Living Expenses: Somewhat hard  Food Insecurity: Food Insecurity Present (06/01/2022)   Hunger Vital Sign    Worried About Running Out of Food in the Last Year: Sometimes true    Ran Out of Food in the Last Year: Sometimes true  Transportation Needs: No Transportation Needs (06/01/2022)   PRAPARE - Administrator, Civil Service (Medical): No    Lack of Transportation (Non-Medical): No  Physical Activity: Insufficiently Active (06/01/2022)    Exercise Vital Sign    Days of Exercise per Week: 3 days    Minutes of Exercise per Session: 30 min  Stress: Stress Concern Present (06/01/2022)   [employer] of Occupational Health - Occupational Stress Questionnaire    Feeling of Stress : To some extent  Social Connections: Socially Integrated (06/01/2022)   Social Connection and Isolation Panel [NHANES]    Frequency of Communication with Friends and Family: More than three times a week    Frequency of Social Gatherings with Friends and Family: Once a week    Attends Religious Services: More than 4 times per year    Active Member of [employer] or Organizations: Yes    Attends Banker Meetings: More than 4 times per year    Marital Status: Married  Catering Manager Violence: Not At Risk (05/26/2022)   Humiliation, Afraid, Rape, and Kick questionnaire    Fear of Current or Ex-Partner: No    Emotionally Abused: No    Physically Abused: No    Sexually Abused: No   Family Status  Relation Name Status   Mother schuyler Deceased       alcoholism   Father  Deceased       gun shot wound   Daughter Darice Alive   Other  (Not Specified)   Sister Psychologist, Prison [employer] Probation Services   Other  (Not Specified)       Half-sister   Other  (Not Specified)       Half-sister   Brother Warden/ranger Alive   MGM  Deceased   MGF  Deceased   PGM  Deceased       old age   PGF harold Deceased       panceatic cancer   Sister 1/2 - Animal Nutritionist   Sister 1/2 - Tammy Alive   Son Therapist, Sports Alive   Daughter Michone Alive   Neg Hx  (Not Specified)  No partnership data on file   Family History  Problem Relation Age of Onset   Alcohol abuse Mother    Healthy Daughter    Parkinson's disease Other    Stroke Other    Diabetes Sister    Heart disease Other    Heart disease Other    Alcohol abuse Brother    Cancer Paternal Grandfather  pancreatic   Heart disease Sister    Heart disease Sister    Healthy Son    Healthy Daughter    Bladder Cancer Neg Hx     Kidney cancer Neg Hx    Allergies  Allergen Reactions   Influenza Vaccines     GUILLAIN BARRE SYNDROME    Pneumococcal Vaccine Other (See Comments)    GUILLAIN BARRE SYNDROME NUMBNESS, SOB,CHEST PAIN    Prevnar [Pneumococcal 13-Val Conj Vacc] Other (See Comments)    GUILLAIN-BARRE SYNDROME      Review of Systems  All other systems reviewed and are negative.     Objective:     BP 134/86   Pulse 84   Temp 97.7 F (36.5 C) (Oral)   Resp 16   Ht 5' 9 (1.753 m)   Wt 122 lb 3.2 oz (55.4 kg)   SpO2 96%   BMI 18.05 kg/m  BP Readings from Last 3 Encounters:  01/11/23 134/86  10/10/22 104/60  08/31/22 120/66   Wt Readings from Last 3 Encounters:  01/11/23 122 lb 3.2 oz (55.4 kg)  10/19/22 117 lb (53.1 kg)  10/10/22 117 lb 12.8 oz (53.4 kg)      Physical Exam Constitutional:      Appearance: Normal appearance.  HENT:     Head: Normocephalic and atraumatic.  Eyes:     Conjunctiva/sclera: Conjunctivae normal.  Cardiovascular:     Rate and Rhythm: Normal rate and regular rhythm.  Pulmonary:     Effort: Pulmonary effort is normal.     Breath sounds: Normal breath sounds.  Musculoskeletal:     Right lower leg: No edema.     Left lower leg: No edema.  Skin:    General: Skin is warm and dry.  Neurological:     General: No focal deficit present.     Mental Status: He is alert. Mental status is at baseline.  Psychiatric:        Mood and Affect: Mood normal.        Behavior: Behavior normal.      No results found for any visits on 01/11/23.  Last CBC Lab Results  Component Value Date   WBC 3.8 03/29/2022   HGB 15.3 03/29/2022   HCT 45.7 03/29/2022   MCV 93.3 03/29/2022   MCH 31.2 03/29/2022   RDW 11.8 03/29/2022   PLT 251 03/29/2022   Last metabolic panel Lab Results  Component Value Date   GLUCOSE 89 03/29/2022   NA 139 03/29/2022   K 5.1 03/29/2022   CL 102 03/29/2022   CO2 28 03/29/2022   BUN 16 03/29/2022   CREATININE 0.84 03/29/2022    EGFR 91 03/29/2022   CALCIUM  10.1 03/29/2022   PROT 7.6 03/29/2022   ALBUMIN 4.5 03/27/2016   BILITOT 0.5 03/29/2022   ALKPHOS 64 03/27/2016   AST 39 (H) 03/29/2022   ALT 36 03/29/2022   ANIONGAP 7 06/29/2021   Last lipids Lab Results  Component Value Date   CHOL 121 03/29/2022   HDL 61 03/29/2022   LDLCALC 46 03/29/2022   TRIG 49 03/29/2022   CHOLHDL 2.0 03/29/2022   Last hemoglobin A1c Lab Results  Component Value Date   HGBA1C 5.4 03/29/2022   Last thyroid functions Lab Results  Component Value Date   TSH 1.766 11/30/2019   Last vitamin D No results found for: 25OHVITD2, 25OHVITD3, VD25OH Last vitamin B12 and Folate No results found for: VITAMINB12, FOLATE    The ASCVD Risk score (Arnett DK,  et al., 2019) failed to calculate for the following reasons:   Risk score cannot be calculated because patient has a medical history suggesting prior/existing ASCVD    Assessment & Plan:  History of Guillain-Barre syndrome Assessment & Plan: Stable, takes Gabapentin  100 mg daily for sequale from GB  Orders: -     Gabapentin ; Take 1 capsule (100 mg total) by mouth daily as needed.  Dispense: 90 capsule; Refill: 1  Weight loss, unintentional Assessment & Plan: Improved, gained 5 pounds since October but BMI still underweight. Appetite good, no longer taking Remeron . Will continue to monitor.    Primary hypertension Assessment & Plan: BP controlled, continue Metoprolol .   Hypercholesteremia Assessment & Plan: Plan to recheck labs at follow up, continue statin and aspirin .   Centrilobular emphysema (HCC) Assessment & Plan: Stable, doing well after having COVID a few months ago. Using Advair daily, has Albuterol  to use as needed. Continues to decline vaccines.       Return in about 3 months (around 04/11/2023).    Sharyle Fischer, DO

## 2023-01-11 NOTE — Assessment & Plan Note (Signed)
 Improved, gained 5 pounds since October but BMI still underweight. Appetite good, no longer taking Remeron. Will continue to monitor.

## 2023-01-11 NOTE — Assessment & Plan Note (Signed)
 BP controlled, continue Metoprolol.

## 2023-01-11 NOTE — Assessment & Plan Note (Signed)
 Stable, doing well after having COVID a few months ago. Using Advair daily, has Albuterol to use as needed. Continues to decline vaccines.

## 2023-01-11 NOTE — Assessment & Plan Note (Signed)
 Plan to recheck labs at follow up, continue statin and aspirin.

## 2023-02-19 ENCOUNTER — Telehealth: Payer: Self-pay

## 2023-02-19 NOTE — Telephone Encounter (Signed)
 Patient states he received a letter in the mail that It was time to schedule his repeat colonoscopy in June. Please call patient to discuss.

## 2023-02-19 NOTE — Telephone Encounter (Signed)
 Spoken to patient. I have made patient aware of some changes in our office. A recall letter will be sent out in May for patient to call our office.

## 2023-04-11 ENCOUNTER — Encounter: Payer: Self-pay | Admitting: Internal Medicine

## 2023-04-11 ENCOUNTER — Ambulatory Visit: Payer: Self-pay | Admitting: Internal Medicine

## 2023-04-11 ENCOUNTER — Other Ambulatory Visit: Payer: Self-pay

## 2023-04-11 VITALS — BP 110/72 | HR 72 | Temp 97.8°F | Resp 16 | Ht 69.0 in | Wt 116.5 lb

## 2023-04-11 DIAGNOSIS — E7800 Pure hypercholesterolemia, unspecified: Secondary | ICD-10-CM

## 2023-04-11 DIAGNOSIS — I10 Essential (primary) hypertension: Secondary | ICD-10-CM

## 2023-04-11 DIAGNOSIS — L989 Disorder of the skin and subcutaneous tissue, unspecified: Secondary | ICD-10-CM

## 2023-04-11 DIAGNOSIS — R634 Abnormal weight loss: Secondary | ICD-10-CM | POA: Diagnosis not present

## 2023-04-11 DIAGNOSIS — Z125 Encounter for screening for malignant neoplasm of prostate: Secondary | ICD-10-CM

## 2023-04-11 DIAGNOSIS — Z1211 Encounter for screening for malignant neoplasm of colon: Secondary | ICD-10-CM

## 2023-04-11 DIAGNOSIS — G4700 Insomnia, unspecified: Secondary | ICD-10-CM | POA: Diagnosis not present

## 2023-04-11 MED ORDER — HYDROCORTISONE 1 % EX OINT: 1.0000 | TOPICAL_OINTMENT | Freq: Two times a day (BID) | CUTANEOUS | 0 refills | Status: AC

## 2023-04-11 MED ORDER — ATORVASTATIN CALCIUM 40 MG PO TABS: 40.0000 mg | ORAL_TABLET | Freq: Every day | ORAL | 3 refills | Status: AC

## 2023-04-11 MED ORDER — METOPROLOL SUCCINATE ER 25 MG PO TB24: ORAL_TABLET | ORAL | 1 refills | Status: DC

## 2023-04-11 NOTE — Assessment & Plan Note (Signed)
 Stable, continue statin

## 2023-04-11 NOTE — Assessment & Plan Note (Signed)
 Blood pressure stable here today, no changes made to medications and appropriate refills sent to pharmacy. Labs due.

## 2023-04-11 NOTE — Patient Instructions (Addendum)
 It was great seeing you today!  Plan discussed at today's visit: -Blood work ordered today, results will be uploaded to MyChart.  -Start Melatonin 3 mg extended release at night, try to avoid harsh lighting from phones, tablets or TV right before bed -Increase Gabapentin to 200 mg at bedtime for restless leg -Referral to GI for colonoscopy placed   Follow up in: 3 months  Take care and let us know if you have any questions or concerns prior to your next visit.  Dr. Caralee Ates

## 2023-04-11 NOTE — Addendum Note (Signed)
 Addended by: Margarita Mail on: 04/11/2023 10:16 AM   Modules accepted: Level of Service

## 2023-04-11 NOTE — Assessment & Plan Note (Signed)
 Discussed sleep hygiene, avoid harsh lightening before bed. Recommend melatonin 3 mg ER before bed and will increase Gabapentin to 200 mg at bedtime.

## 2023-04-11 NOTE — Progress Notes (Signed)
 Established Patient Office Visit  Subjective   Patient ID: Brent [name], male    DOB: [birth year]  Age: 78 y.o. MRN: [id number]  Chief Complaint  Patient presents with   Medical Management of Chronic Issues    3 month recheck    HPI  Patient is here for follow up on chronic medical conditions. Patient states he's been having trouble sleeping lately. Has difficulty falling asleep and staying asleep. Will try to go to bed around 10 pm, won't fall asleep until 11 or 11:30 and wakes up around 4-5 am but still feels sleepy and fatigued but cannot fall back asleep. Usually lays in bed until the sun comes up. Will play games on the computer before falling asleep. Has not tried any medication for sleep. Does take Gabapentin 100 mg at bedtime but still has pain down his right leg while in bed. Does not have to wake up to go to the bathroom in the night. Will sometimes wake up feeling overheated in the night.   Weight Loss: -Weight had improved at last visit but then lost about 6 pounds since January  -States he's been stressed lately, sometimes he eats really well and other times not so much  -Started on Remeron 15 mg previously, patient took it for 2 days and stopped it because it made him "feel like a zombie". Dose changed to 7.5 mg which he took about 3 times and stopped taking, not on anything now -Typically eating 2 meals a day with snacks, does finish meals. Had been drinking Ensure but not anymore.  HTN/Hx of NSTEMI/CAD/HLD: -NSTEMI 11/2019 -Medications: Metoprolol 12.5 mg, Lipitor 40 mg, aspirin -Patient is compliant with above medications and reports no side effects. -Checks blood pressure at home: averaging 120-130/80  -Following with Cardiology -Last lipid panel: Lipid Panel     Component Value Date/Time   CHOL 121 03/29/2022 0915   CHOL 158 07/29/2015 1241   TRIG 49 03/29/2022 0915   HDL 61 03/29/2022 0915   HDL 47 07/29/2015 1241   CHOLHDL 2.0 03/29/2022 0915   VLDL 5  12/01/2019 0405   LDLCALC 46 03/29/2022 0915   LABVLDL 40 07/29/2015 1241    COPD: -COPD status: stable -Current medications: Advair, Albuterol PRN -Satisfied with current treatment?: yes -Oxygen use: no -Dyspnea frequency: none at rest sometime with activity  -Cough frequency: none -Rescue inhaler frequency:  rarely  -Limitation of activity: no -Productive cough: None -Pneumovax: Not up to Date - politely declines all vaccines today -Influenza: Not up to Date -politely declines all vaccines today -Lung cancer screening: screening CT scan 7/23 LUNG-RADS 2  History of Pre-Diabetes: -A1c 8 years ago 6.7%, 5.4% 3/24  Hx of Guillain Barre: -Takes Gabapentin 100 mg once daily for continued sequelae of this -Occurred after taking a vaccine - does not get vaccines now because of this  Health Maintenance: -Blood work due -Colonoscopy 06/2018, repeat in 5 years, placing referral today -Patient is a current smoker, underwent lung cancer screening 7/24 which was lung RADS 2  Patient Active Problem List   Diagnosis Date Noted   Insomnia 04/11/2023   Weight loss, unintentional 01/11/2023   Primary hypertension 01/11/2023   Atherosclerosis of native arteries of extremity with intermittent claudication (HCC) 12/04/2021   Hypercholesteremia 12/04/2021   Leg pain, right 06/15/2020   Ventricular tachycardia (HCC)    Hypotension    NSTEMI (non-ST elevated myocardial infarction) (HCC) 11/30/2019   Current mild episode of major depressive disorder without prior episode (HCC) 10/29/2019  Chronic midline low back pain without sciatica 10/29/2019   Adenomatous polyp of colon 10/29/2019   Polyp of ascending colon    Atherosclerosis of aorta (HCC) 05/23/2017   Osteopenia 08/29/2016   Weakness 04/03/2016   Underweight 03/27/2016   Cigarette smoker one half pack a day or less 09/24/2015   Incidental lung nodule, > 3mm and < 8mm 08/03/2015   Centrilobular emphysema (HCC) 08/03/2015   Right  Supraclavicular Lymph node 08/03/2015   Carotid stenosis 07/29/2015   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   Abnormal chest CT 07/29/2015   Coronary artery calcification seen on CAT scan 07/29/2015   Coronary artery disease involving native coronary artery of native heart with angina pectoris (HCC)    Lymphadenopathy of right cervical region 07/19/2015   Aortic calcification (HCC) 07/10/2015   Benign neoplasm of transverse colon    Benign neoplasm of descending colon    Benign neoplasm of sigmoid colon    Cataract    Tobacco abuse    History of colonic polyps    Hypochloremia    Low sodium levels 08/18/2013   Back pain without radiation 08/18/2013   Hyposmolality and/or hyponatremia 08/18/2013   History of Guillain-Barre syndrome 08/11/2013   Past Medical History:  Diagnosis Date   Allergy    Aortic calcification (HCC) 07/2015   Noted on chest CT with contrast   Cataract    Centrilobular emphysema (HCC) 08/03/2015   Noted on chest CT July 2017   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   Stroke [facility])    Tobacco abuse    Past Surgical History:  Procedure Laterality Date   COLONOSCOPY WITH PROPOFOL N/A 09/22/2014   Procedure: COLONOSCOPY WITH PROPOFOL;  Surgeon: Midge Minium, MD;  Location: [facility] ENDOSCOPY;  Service: Endoscopy;  Laterality: N/A;   COLONOSCOPY WITH PROPOFOL N/A 07/09/2018   Procedure: COLONOSCOPY WITH PROPOFOL;  Surgeon: Midge Minium, MD;   Location: [facility] ENDOSCOPY;  Service: Endoscopy;  Laterality: N/A;   DEEP NECK LYMPH NODE BIOPSY / EXCISION Right    HERNIA REPAIR  1988   LEFT HEART CATH AND CORONARY ANGIOGRAPHY N/A 12/01/2019   Procedure: LEFT HEART CATH AND CORONARY ANGIOGRAPHY;  Surgeon: Iran Ouch, MD;  Location: [facility] INVASIVE CV LAB;  Service: Cardiovascular;  Laterality: N/A;   LYMPH NODE BIOPSY     NM MYOVIEW ([facility] HX)  10/09/2013   Dr. Dorothyann Peng: EF 58%. Normal wall motion. No ischemia or infarction.   shoulder     SHOULDER ARTHROSCOPY Right    TRANSTHORACIC ECHOCARDIOGRAM  09/24/2013   EF 45-50% with mild global hypokinesis. GR 1 DD. No valvular lesions besides mild TR. Normal RV pressures.   Social History   Tobacco Use   Smoking status: Some Days    Current packs/day: 0.15    Average packs/day: 0.2 packs/day for 61.2 years (9.2 ttl pk-yrs)    Types: Cigarettes    Start date: 1964   Smokeless tobacco: Never  Tobacco comments:    New quit date 02/24/20 - made 02/05/20; currently smoking 3 cigs per week 05/20/20  Vaping Use   Vaping status: Never Used  Substance Use Topics   Alcohol use: No   Drug use: No   Social History   Socioeconomic History   Marital status: Married    Spouse name: Meriam Sprague   Number of children: 3   Years of education: some college   Highest education level: 12th grade  Occupational History   Occupation: Retired  Tobacco Use   Smoking status: Some Days    Current packs/day: 0.15    Average packs/day: 0.2 packs/day for 61.2 years (9.2 ttl pk-yrs)    Types: Cigarettes    Start date: 1964   Smokeless tobacco: Never   Tobacco comments:    New quit date 02/24/20 - made 02/05/20; currently smoking 3 cigs per week 05/20/20  Vaping Use   Vaping status: Never Used  Substance and Sexual Activity   Alcohol use: No   Drug use: No   Sexual activity: Not Currently    Partners: Female  Other Topics Concern   Not on file  Social History Narrative   Current tobacco user,3  cigarettes per week, previously 2 packs per day for 52 years, formerly worked as a Naval architect, has dogs at home.   Social Drivers of Health   Financial Resource Strain: Medium Risk (06/01/2022)   Overall Financial Resource Strain (CARDIA)    Difficulty of Paying Living Expenses: Somewhat hard  Food Insecurity: Food Insecurity Present (06/01/2022)   Hunger Vital Sign    Worried About Running Out of Food in the Last Year: Sometimes true    Ran Out of Food in the Last Year: Sometimes true  Transportation Needs: No Transportation Needs (06/01/2022)   PRAPARE - Administrator, Civil Service (Medical): No    Lack of Transportation (Non-Medical): No  Physical Activity: Insufficiently Active (06/01/2022)   Exercise Vital Sign    Days of Exercise per Week: 3 days    Minutes of Exercise per Session: 30 min  Stress: Stress Concern Present (06/01/2022)   [employer] of Occupational Health - Occupational Stress Questionnaire    Feeling of Stress : To some extent  Social Connections: Socially Integrated (06/01/2022)   Social Connection and Isolation Panel [NHANES]    Frequency of Communication with Friends and Family: More than three times a week    Frequency of Social Gatherings with Friends and Family: Once a week    Attends Religious Services: More than 4 times per year    Active Member of [employer] or Organizations: Yes    Attends Banker Meetings: More than 4 times per year    Marital Status: Married  Catering manager Violence: Not At Risk (05/26/2022)   Humiliation, Afraid, Rape, and Kick questionnaire    Fear of Current or Ex-Partner: No    Emotionally Abused: No    Physically Abused: No    Sexually Abused: No   Family Status  Relation Name Status   Mother Zollie Scale Deceased       alcoholism   Father  Deceased       gun shot wound   Daughter Darice Alive   Other  (Not Specified)   Sister Psychologist, prison and probation services   Other  (Not Specified)       Half-sister   Other  (Not  Specified)       Half-sister   Brother Jerilynn Som Alive   MGM  Deceased   MGF  Deceased   PGM  Deceased       old age   PGF harold Deceased       panceatic cancer   Sister 1/2 - Animal nutritionist   Sister 1/2 - Tammy Alive   Son Therapist, sports Alive   Daughter Michone Alive   Neg Hx  (Not Specified)  No partnership data on file   Family History  Problem Relation Age of Onset   Alcohol abuse Mother    Healthy Daughter    Parkinson's disease Other    Stroke Other    Diabetes Sister    Heart disease Other    Heart disease Other    Alcohol abuse Brother    Cancer Paternal Grandfather        pancreatic   Heart disease Sister    Heart disease Sister    Healthy Son    Healthy Daughter    Bladder Cancer Neg Hx    Kidney cancer Neg Hx    Allergies  Allergen Reactions   Influenza Vaccines     GUILLAIN BARRE SYNDROME    Pneumococcal Vaccine Other (See Comments)    GUILLAIN BARRE SYNDROME NUMBNESS, SOB,CHEST PAIN    Prevnar [Pneumococcal 13-Val Conj Vacc] Other (See Comments)    GUILLAIN-BARRE SYNDROME      Review of Systems  Constitutional:  Positive for malaise/fatigue and weight loss.      Objective:     BP 110/72 (Cuff Size: Normal)   Pulse 72   Temp 97.8 F (36.6 C) (Oral)   Resp 16   Ht 5\' 9"  (1.753 m)   Wt 116 lb 8 oz (52.8 kg)   SpO2 96%   BMI 17.20 kg/m  BP Readings from Last 3 Encounters:  04/11/23 110/72  01/11/23 134/86  10/10/22 104/60   Wt Readings from Last 3 Encounters:  04/11/23 116 lb 8 oz (52.8 kg)  01/11/23 122 lb 3.2 oz (55.4 kg)  10/19/22 117 lb (53.1 kg)      Physical Exam Constitutional:      Appearance: Normal appearance.  HENT:     Head: Normocephalic and atraumatic.  Eyes:     Conjunctiva/sclera: Conjunctivae normal.  Neck:     Comments: No thyromegaly Cardiovascular:     Rate and Rhythm: Normal rate and regular rhythm.  Pulmonary:     Effort: Pulmonary effort is normal.     Breath sounds: Normal breath sounds.   Musculoskeletal:     Cervical back: No tenderness.  Lymphadenopathy:     Cervical: No cervical adenopathy.  Skin:    General: Skin is warm and dry.  Neurological:     General: No focal deficit present.     Mental Status: He is alert. Mental status is at baseline.  Psychiatric:        Mood and Affect: Mood normal.        Behavior: Behavior normal.      No results found for any visits on 04/11/23.  Last CBC Lab Results  Component Value Date   WBC 3.8 03/29/2022   HGB 15.3 03/29/2022   HCT 45.7 03/29/2022   MCV 93.3 03/29/2022   MCH 31.2 03/29/2022   RDW 11.8 03/29/2022   PLT 251 03/29/2022   Last metabolic panel Lab Results  Component Value Date   GLUCOSE 89 03/29/2022   NA 139 03/29/2022   K 5.1 03/29/2022   CL 102 03/29/2022   CO2 28 03/29/2022   BUN 16 03/29/2022  CREATININE 0.84 03/29/2022   EGFR 91 03/29/2022   CALCIUM 10.1 03/29/2022   PROT 7.6 03/29/2022   ALBUMIN 4.5 03/27/2016   BILITOT 0.5 03/29/2022   ALKPHOS 64 03/27/2016   AST 39 (H) 03/29/2022   ALT 36 03/29/2022   ANIONGAP 7 06/29/2021   Last lipids Lab Results  Component Value Date   CHOL 121 03/29/2022   HDL 61 03/29/2022   LDLCALC 46 03/29/2022   TRIG 49 03/29/2022   CHOLHDL 2.0 03/29/2022   Last hemoglobin A1c Lab Results  Component Value Date   HGBA1C 5.4 03/29/2022   Last thyroid functions Lab Results  Component Value Date   TSH 1.766 11/30/2019   Last vitamin D No results found for: "25OHVITD2", "25OHVITD3", "VD25OH" Last vitamin B12 and Folate No results found for: "VITAMINB12", "FOLATE"    The ASCVD Risk score (Arnett DK, et al., 2019) failed to calculate for the following reasons:   Risk score cannot be calculated because patient has a medical history suggesting prior/existing ASCVD    Assessment & Plan:  Primary hypertension Assessment & Plan: Blood pressure stable here today, no changes made to medications and appropriate refills sent to pharmacy. Labs  due.  Orders: -     CBC with Differential/Platelet -     COMPLETE METABOLIC PANEL WITHOUT GFR -     Metoprolol Succinate ER; TAKE 1/2 TABLET(12.5 MG) BY MOUTH DAILY  Dispense: 90 tablet; Refill: 1  Hypercholesteremia Assessment & Plan: Stable, continue statin.   Orders: -     Atorvastatin Calcium; Take 1 tablet (40 mg total) by mouth daily.  Dispense: 90 tablet; Refill: 3  Weight loss, unintentional Assessment & Plan: Had been stable but lost another 6 pounds in 3 months. Patient states he is sometimes stressed which can affect his appetite. Recommend supplementing meals with Ensure and will order labs today.   Orders: -     TSH  Insomnia, unspecified type Assessment & Plan: Discussed sleep hygiene, avoid harsh lightening before bed. Recommend melatonin 3 mg ER before bed and will increase Gabapentin to 200 mg at bedtime.    Prostate cancer screening -     PSA  Skin lesion -     Hydrocortisone; Apply 1 Application topically 2 (two) times daily.  Dispense: 30 g; Refill: 0  Colon cancer screening -     Ambulatory referral to Gastroenterology     Return in about 3 months (around 07/11/2023).    Margarita Mail, DO

## 2023-04-11 NOTE — Assessment & Plan Note (Signed)
 Had been stable but lost another 6 pounds in 3 months. Patient states he is sometimes stressed which can affect his appetite. Recommend supplementing meals with Ensure and will order labs today.

## 2023-04-12 ENCOUNTER — Encounter: Payer: Self-pay | Admitting: Internal Medicine

## 2023-04-12 LAB — CBC WITH DIFFERENTIAL/PLATELET
Absolute Lymphocytes: 1306 {cells}/uL (ref 850–3900)
Absolute Monocytes: 451 {cells}/uL (ref 200–950)
Basophils Absolute: 30 {cells}/uL (ref 0–200)
Basophils Relative: 0.8 %
Eosinophils Absolute: 70 {cells}/uL (ref 15–500)
Eosinophils Relative: 1.9 %
HCT: 45.8 % (ref 38.5–50.0)
Hemoglobin: 15.3 g/dL (ref 13.2–17.1)
MCH: 31.3 pg (ref 27.0–33.0)
MCHC: 33.4 g/dL (ref 32.0–36.0)
MCV: 93.7 fL (ref 80.0–100.0)
MPV: 10 fL (ref 7.5–12.5)
Monocytes Relative: 12.2 %
Neutro Abs: 1843 {cells}/uL (ref 1500–7800)
Neutrophils Relative %: 49.8 %
Platelets: 240 10*3/uL (ref 140–400)
RBC: 4.89 10*6/uL (ref 4.20–5.80)
RDW: 11.5 % (ref 11.0–15.0)
Total Lymphocyte: 35.3 %
WBC: 3.7 10*3/uL — ABNORMAL LOW (ref 3.8–10.8)

## 2023-04-12 LAB — COMPLETE METABOLIC PANEL WITHOUT GFR
AG Ratio: 1.8 (calc) (ref 1.0–2.5)
ALT: 27 U/L (ref 9–46)
AST: 36 U/L — ABNORMAL HIGH (ref 10–35)
Albumin: 4.6 g/dL (ref 3.6–5.1)
Alkaline phosphatase (APISO): 68 U/L (ref 35–144)
BUN: 15 mg/dL (ref 7–25)
CO2: 28 mmol/L (ref 20–32)
Calcium: 9.3 mg/dL (ref 8.6–10.3)
Chloride: 102 mmol/L (ref 98–110)
Creat: 0.82 mg/dL (ref 0.70–1.28)
Globulin: 2.5 g/dL (ref 1.9–3.7)
Glucose, Bld: 96 mg/dL (ref 65–99)
Potassium: 4.6 mmol/L (ref 3.5–5.3)
Sodium: 138 mmol/L (ref 135–146)
Total Bilirubin: 0.5 mg/dL (ref 0.2–1.2)
Total Protein: 7.1 g/dL (ref 6.1–8.1)

## 2023-04-12 LAB — PSA: PSA: 0.42 ng/mL (ref <=4.00)

## 2023-04-12 LAB — TSH: TSH: 1.59 m[IU]/L (ref 0.40–4.50)

## 2023-04-23 ENCOUNTER — Encounter: Payer: Self-pay | Admitting: *Deleted

## 2023-04-26 ENCOUNTER — Telehealth: Payer: Self-pay

## 2023-04-26 NOTE — Telephone Encounter (Signed)
 Pt requesting call back to schedule colonoscopy.

## 2023-05-08 ENCOUNTER — Other Ambulatory Visit: Payer: Self-pay

## 2023-05-08 ENCOUNTER — Telehealth: Payer: Self-pay

## 2023-05-08 DIAGNOSIS — Z860100 Personal history of colon polyps, unspecified: Secondary | ICD-10-CM

## 2023-05-08 MED ORDER — NA SULFATE-K SULFATE-MG SULF 17.5-3.13-1.6 GM/177ML PO SOLN: 1.0000 | Freq: Once | ORAL | 0 refills | Status: AC

## 2023-05-08 NOTE — Progress Notes (Signed)
 Gastroenterology Pre-Procedure Review  Request Date: 07/10/23 Requesting Physician: Dr. Ole Berkeley  PATIENT REVIEW QUESTIONS: The patient responded to the following health history questions as indicated:    1. Are you having any GI issues? no 2. Do you have a personal history of Polyps? yes (07/09/2018 performed by Dr. Ole Berkeley recommended repeat in 5 years) 3. Do you have a family history of Colon Cancer or Polyps? no 4. Diabetes Mellitus? no 5. Joint replacements in the past 12 months?no 6. Major health problems in the past 3 months?no 7. Any artificial heart valves, MVP, or defibrillator?no 8. Cardiac history? Pt has been advised that he will need cardiac clearance due to his cardiac history.  Clearance sent to Dr. Alvenia Aus [facility] Preop Team    MEDICATIONS & ALLERGIES:    Patient reports the following regarding taking any anticoagulation/antiplatelet therapy:   Plavix , Coumadin, Eliquis, Xarelto, Lovenox , Pradaxa, Brilinta, or Effient? no Aspirin ? yes (81 mg daily)  Patient confirms/reports the following medications:  Current Outpatient Medications  Medication Sig Dispense Refill   acetaminophen  (TYLENOL ) 325 MG tablet Take 650 mg by mouth every 6 (six) hours as needed.      albuterol  (VENTOLIN  HFA) 108 (90 Base) MCG/ACT inhaler Inhale 2 puffs into the lungs every 6 (six) hours as needed for wheezing or shortness of breath. 6.7 g 2   aspirin  EC 81 MG EC tablet Take 1 tablet (81 mg total) by mouth daily.     atorvastatin  (LIPITOR ) 40 MG tablet Take 1 tablet (40 mg total) by mouth daily. 90 tablet 3   fluticasone  (FLONASE ) 50 MCG/ACT nasal spray Place 2 sprays into both nostrils daily. 16 g 6   fluticasone -salmeterol (ADVAIR) 100-50 MCG/ACT AEPB Inhale 1 puff into the lungs 2 (two) times daily. 1 each 3   gabapentin  (NEURONTIN ) 100 MG capsule Take 1 capsule (100 mg total) by mouth daily as needed. 90 capsule 1   hydrocortisone  1 % ointment Apply 1 Application topically 2 (two) times daily. 30  g 0   metoprolol  succinate (TOPROL -XL) 25 MG 24 hr tablet TAKE 1/2 TABLET(12.5 MG) BY MOUTH DAILY 90 tablet 1   No current facility-administered medications for this visit.    Patient confirms/reports the following allergies:  Allergies  Allergen Reactions   Influenza Vaccines     GUILLAIN BARRE SYNDROME    Pneumococcal Vaccine Other (See Comments)    GUILLAIN BARRE SYNDROME NUMBNESS, SOB,CHEST PAIN    Prevnar [Pneumococcal 13-Val Conj Vacc] Other (See Comments)    GUILLAIN-BARRE SYNDROME    No orders of the defined types were placed in this encounter.   AUTHORIZATION INFORMATION Primary Insurance: 1D#: Group #:  Secondary Insurance: 1D#: Group #:  SCHEDULE INFORMATION: Date: 07/10/23 Time: Location: [facility]

## 2023-05-08 NOTE — Telephone Encounter (Signed)
 Gastroenterology Pre-Procedure Review  Request Date: 07/10/23 Requesting Physician: Dr. Ole Berkeley  PATIENT REVIEW QUESTIONS: The patient responded to the following health history questions as indicated:    1. Are you having any GI issues? no 2. Do you have a personal history of Polyps? yes (last colonoscopy performed by Dr. Ole Berkeley 07/09/2018 recommended repeat in 5 years) 3. Do you have a family history of Colon Cancer or Polyps? no 4. Diabetes Mellitus? no 5. Joint replacements in the past 12 months?no 6. Major health problems in the past 3 months?no 7. Any artificial heart valves, MVP, or defibrillator?no 8. Cardiac history? Pt has been advised due to cardiac history he will need cardiac clearance.  Faxed to Heart Care Preop Team    MEDICATIONS & ALLERGIES:    Patient reports the following regarding taking any anticoagulation/antiplatelet therapy:   Plavix , Coumadin, Eliquis, Xarelto, Lovenox , Pradaxa, Brilinta, or Effient? no Aspirin ? yes (81 mg daily)  Patient confirms/reports the following medications:  Current Outpatient Medications  Medication Sig Dispense Refill   acetaminophen  (TYLENOL ) 325 MG tablet Take 650 mg by mouth every 6 (six) hours as needed.      albuterol  (VENTOLIN  HFA) 108 (90 Base) MCG/ACT inhaler Inhale 2 puffs into the lungs every 6 (six) hours as needed for wheezing or shortness of breath. 6.7 g 2   aspirin  EC 81 MG EC tablet Take 1 tablet (81 mg total) by mouth daily.     atorvastatin  (LIPITOR ) 40 MG tablet Take 1 tablet (40 mg total) by mouth daily. 90 tablet 3   fluticasone  (FLONASE ) 50 MCG/ACT nasal spray Place 2 sprays into both nostrils daily. 16 g 6   fluticasone -salmeterol (ADVAIR) 100-50 MCG/ACT AEPB Inhale 1 puff into the lungs 2 (two) times daily. 1 each 3   gabapentin  (NEURONTIN ) 100 MG capsule Take 1 capsule (100 mg total) by mouth daily as needed. 90 capsule 1   hydrocortisone  1 % ointment Apply 1 Application topically 2 (two) times daily. 30 g 0    metoprolol  succinate (TOPROL -XL) 25 MG 24 hr tablet TAKE 1/2 TABLET(12.5 MG) BY MOUTH DAILY 90 tablet 1   No current facility-administered medications for this visit.    Patient confirms/reports the following allergies:  Allergies  Allergen Reactions   Influenza Vaccines     GUILLAIN BARRE SYNDROME    Pneumococcal Vaccine Other (See Comments)    GUILLAIN BARRE SYNDROME NUMBNESS, SOB,CHEST PAIN    Prevnar [Pneumococcal 13-Val Conj Vacc] Other (See Comments)    GUILLAIN-BARRE SYNDROME    No orders of the defined types were placed in this encounter.   AUTHORIZATION INFORMATION Primary Insurance: 1D#: Group #:  Secondary Insurance: 1D#: Group #:  SCHEDULE INFORMATION: Date: 07/10/23 Time: Location: [facility]

## 2023-05-14 ENCOUNTER — Telehealth: Payer: Self-pay | Admitting: *Deleted

## 2023-05-14 NOTE — Telephone Encounter (Signed)
   Pre-operative Risk Assessment    Patient Name: Brent [name]  DOB: 1945/01/15 MRN: [id number]   Date of last office visit: 05/19/21 DR. ARIDA Date of next office visit: NONE   Request for Surgical Clearance    Procedure:  COLONOSCOPY  Date of Surgery:  Clearance 07/10/23                                Surgeon:  DR.WOHL Surgeon's Group or Practice Name:  [facility] Phone number:  [phone] Alberteen Aloe, CMA Fax number:  [phone]   Type of Clearance Requested:   - Medical    Type of Anesthesia:  General    Additional requests/questions:    [name]   05/14/2023, 10:45 AM

## 2023-05-14 NOTE — Telephone Encounter (Signed)
 Patient has been scheduled for pre op clearance.

## 2023-05-14 NOTE — Telephone Encounter (Signed)
 Primary Cardiologist:None  Chart reviewed as part of pre-operative protocol coverage. Because of Brent [name]'s past medical history and time since last visit, he/she will require a follow-up visit in order to better assess preoperative cardiovascular risk.  Pre-op covering staff: - Please schedule appointment and call patient to inform them. Patient has not been since since May 2023.  - Please contact requesting surgeon's office via preferred method (i.e, phone, fax) to inform them of need for appointment prior to surgery.  Gerldine Koch, NP-C  05/14/2023, 2:48 PM [address] [place], [place] [address] Office [phone] Fax [phone]

## 2023-05-23 NOTE — Progress Notes (Unsigned)
 Cardiology Office Note    Date:  05/25/2023   ID:  Brent [name], DOB 1946-01-07, MRN [id number]  PCP:  Rockney Cid, DO  Cardiologist:  Antionette Kirks, MD  Electrophysiologist:  None   Chief Complaint: Pre-operative cardiac risk assessment  History of Present Illness:   Brent [name] is a 78 y.o. male with history of CAD, hypertension, hyperlipidemia, COPD, tobacco use, prediabetes, and history of Guillain-Barr who presents for preoperative evaluation.    Previously followed by Dr. Beau Bound.  Echo 09/2013 showed mildly reduced EF. Patient had Myoview  10/2013 that was negative for ischemia. CT of the chest which showed calcification of the coronary arteries and was subsequently referred to cardiology.  He was asymptomatic from a cardiac perspective.  Patient was seen at [facility] ED 11/2019 for substernal chest pain.  Troponin increased to greater than 20,000.  He was taken for urgent cardiac catheterization which showed single-vessel disease with 90% stenosis of the RCA not amendable to PCI, with the coronary arteries being heavily calcified, particularly in the proximal segments. Echo showed EF 55 to 60%, akinesis of the basal inferior segment with mild hypokinesis of the mid inferior segment, G2 DD.  Medical treatment with DAPT was recommended for 1 year.  This hospitalization was complicated by NSVT and patient was subsequently started on Toprol .  Seen in follow-up 04/2020 and was complaining of lower extremity pain.  Doppler 06/2020 showed normal ABI without evidence of PAD.  Repeat Dopplers in 11/28/2021 and 11/29/2022 showed normal right ABI and moderate left lower extremity arterial disease. Seen by vascular who recommended observation and conservative management.    Patient was most recently seen 05/2021 by Dr. Alvenia Aus and at that time was doing well without symptoms of chest pain or dyspnea. He had recently completed cardiac rehabilitation. It was recommended that he follow-up in  12 months but was lost to follow-up.  Patient is scheduled for routine screening colonoscopy 7/1 with Dr. Ole Berkeley and our office was contacted to provide pre operative risk assessment.   Today, patient is doing well from a cardiac perspective without symptoms of angina and cardiac decompensation.  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  He is able to walk 2 blocks and climb a flight of stairs without chest pain.  He has mild shortness of breath with activity which is baseline for him and that he attributes to his COPD.  He denies palpitations, lightheadedness/dizziness, orthopnea, PND, and bleeding.  He takes all of his medications as prescribed without any side effects.  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   Stroke [facility])    Tobacco abuse     Past Surgical History:  Procedure Laterality Date   COLONOSCOPY WITH PROPOFOL  N/A 09/22/2014   Procedure: COLONOSCOPY WITH PROPOFOL ;  Surgeon: Marnee Sink, MD;  Location: [facility] ENDOSCOPY;  Service: Endoscopy;  Laterality: N/A;    COLONOSCOPY WITH PROPOFOL  N/A 07/09/2018   Procedure: COLONOSCOPY WITH PROPOFOL ;  Surgeon: Marnee Sink, MD;  Location: [facility] ENDOSCOPY;  Service: Endoscopy;  Laterality: N/A;   DEEP NECK LYMPH NODE BIOPSY / EXCISION Right    HERNIA REPAIR  1988   LEFT HEART CATH AND CORONARY ANGIOGRAPHY N/A 12/01/2019   Procedure: LEFT HEART CATH AND CORONARY ANGIOGRAPHY;  Surgeon: Wenona Hamilton, MD;  Location: [facility] INVASIVE CV LAB;  Service: Cardiovascular;  Laterality: N/A;   LYMPH NODE BIOPSY     NM MYOVIEW  ([facility] HX)  10/09/2013   Dr. Burney Carter: EF 58%. Normal wall motion. No ischemia or infarction.   shoulder     SHOULDER ARTHROSCOPY Right    TRANSTHORACIC ECHOCARDIOGRAM  09/24/2013   EF 45-50% with mild global hypokinesis. GR 1 DD. No valvular lesions besides mild TR. Normal RV pressures.    Current Medications: Current Meds  Medication Sig   acetaminophen  (TYLENOL ) 325 MG tablet Take 650 mg by mouth every 6 (six) hours as needed.    albuterol  (VENTOLIN  HFA) 108 (90 Base) MCG/ACT inhaler Inhale 2 puffs into the lungs every 6 (six) hours as needed for wheezing or shortness of breath.   aspirin  EC 81 MG EC tablet Take 1 tablet (81 mg total) by mouth daily.   atorvastatin  (LIPITOR ) 40 MG tablet Take 1 tablet (40 mg total) by mouth daily.   fluticasone  (FLONASE ) 50 MCG/ACT nasal spray Place 2 sprays into both nostrils daily.   fluticasone -salmeterol (ADVAIR) 100-50 MCG/ACT AEPB Inhale 1 puff into the lungs 2 (two) times daily.   gabapentin  (NEURONTIN ) 100 MG capsule Take 1 capsule (100 mg total) by mouth daily as needed.   hydrocortisone  1 % ointment Apply 1 Application topically 2 (two) times daily.   metoprolol  succinate (TOPROL -XL) 25 MG 24 hr tablet TAKE 1/2 TABLET(12.5 MG) BY MOUTH DAILY    Allergies:   Influenza vaccines, Pneumococcal vaccine, and Prevnar [pneumococcal 13-val conj vacc]   Social History   Socioeconomic History   Marital status: Married    Spouse name: Alvy Baar    Number of children: 3   Years of education: some college   Highest education level: 12th grade  Occupational History   Occupation: Retired  Tobacco Use   Smoking status: Some Days    Current packs/day: 0.15    Average packs/day: 0.2 packs/day for 61.4 years (9.2 ttl pk-yrs)    Types: Cigarettes    Start date: 1964   Smokeless tobacco: Never   Tobacco comments:    New quit date 02/24/20 - made 02/05/20; currently smoking 3 cigs per week 05/20/20  Vaping Use   Vaping status: Never Used  Substance and Sexual Activity   Alcohol use: No   Drug use: No   Sexual activity: Not Currently    Partners: Female  Other Topics Concern   Not on file  Social History Narrative   Current tobacco user,3 cigarettes per week, previously 2 packs per day for 52 years, formerly worked as a Naval architect, has dogs at home.   Social Drivers of Corporate investment banker Strain: Medium Risk (  06/01/2022)   Overall Financial Resource Strain (CARDIA)    Difficulty of Paying Living Expenses: Somewhat hard  Food Insecurity: Food Insecurity Present (06/01/2022)   Hunger Vital Sign    Worried About Running Out of Food in the Last Year: Sometimes true    Ran Out of Food in the Last Year: Sometimes true  Transportation Needs: No Transportation Needs (06/01/2022)   PRAPARE - Administrator, Civil Service (Medical): No    Lack of Transportation (Non-Medical): No  Physical Activity: Insufficiently Active (06/01/2022)   Exercise Vital Sign    Days of Exercise per Week: 3 days    Minutes of Exercise per Session: 30 min  Stress: Stress Concern Present (06/01/2022)   [employer] of Occupational Health - Occupational Stress Questionnaire    Feeling of Stress : To some extent  Social Connections: Socially Integrated (06/01/2022)   Social Connection and Isolation Panel [NHANES]    Frequency of Communication with Friends and Family: More than three times a week    Frequency of Social Gatherings with Friends  and Family: Once a week    Attends Religious Services: More than 4 times per year    Active Member of [employer] or Organizations: Yes    Attends Engineer, structural: More than 4 times per year    Marital Status: Married     Family History:  The patient's family history includes Alcohol abuse in his brother and mother; Cancer in his paternal grandfather; Diabetes in his sister; Healthy in his daughter, daughter, and son; Heart disease in his sister, sister and other family members; Parkinson's disease in an other family member; Stroke in an other family member. There is no history of Bladder Cancer or Kidney cancer.  ROS:   12-point review of systems is negative unless otherwise noted in the HPI.   EKGs/Labs/Other Studies Reviewed:    Studies reviewed were summarized above. The additional studies were reviewed today:  12/01/2019 Echo complete 1. Left ventricular ejection fraction, by estimation, is 55 to 60%. The  left ventricle has normal function. The left ventricle demonstrates  regional wall motion abnormalities (see scoring diagram/findings for  description). Left ventricular diastolic  parameters are consistent with Grade II diastolic dysfunction  (pseudonormalization). Elevated left atrial pressure. There is akinesis of  the left ventricular, basal inferior segment. There is mild hypokinesis of  the left ventricular, mid inferior  segment.   2. Right ventricular systolic function is normal. The right ventricular  size is normal. There is normal pulmonary artery systolic pressure.   3. Left atrial size was mildly dilated.   4. The mitral valve is normal in structure. Trivial mitral valve  regurgitation. No evidence of mitral stenosis.   5. The aortic valve is tricuspid. Aortic valve regurgitation is not  visualized. No aortic stenosis is present.   6. The inferior vena cava is normal in size with <50% respiratory  variability, suggesting right atrial pressure of 8 mmHg.    12/01/2019 LHC The left ventricular systolic function is normal. LV end diastolic pressure is moderately elevated. The left ventricular ejection fraction is 55-65% by visual estimate. Prox RCA lesion is 90% stenosed. Prox LAD to Mid LAD lesion is 40% stenosed. Mid LAD lesion is 20% stenosed. 1st Diag lesion is 70% stenosed. Ramus lesion is 60% stenosed. Mid Cx to Dist Cx lesion is 70% stenosed. Mid LM to Dist LM lesion is 30% stenosed. 1. Borderline significant one-vessel coronary artery disease affecting the mid left circumflex. The  coronary arteries are heavily calcified especially in the proximal segment with no clear culprit for non-ST elevation myocardial infarction. 2. Normal LV systolic function mildly moderately elevated left ventricular end-diastolic pressure at 22 mmHg  EKG:  EKG is ordered today.  The EKG ordered today demonstrates normal sinus rhythm with minimal LVH.  Recent Labs: 04/11/2023: ALT 27; BUN 15; Creat 0.82; Hemoglobin 15.3; Platelets 240; Potassium 4.6; Sodium 138; TSH 1.59  Recent Lipid Panel    Component Value Date/Time   CHOL 121 03/29/2022 0915   CHOL 158 07/29/2015 1241   TRIG 49 03/29/2022 0915   HDL 61 03/29/2022 0915   HDL 47 07/29/2015 1241   CHOLHDL 2.0 03/29/2022 0915   VLDL 5 12/01/2019 0405   LDLCALC 46 03/29/2022 0915    PHYSICAL EXAM:    VS:  BP 116/62   Pulse 73   Ht 5' 9.5" (1.765 m)   Wt 117 lb 3.2 oz (53.2 kg)   SpO2 97%   BMI 17.06 kg/m   BMI: Body mass index is 17.06 kg/m.  Physical Exam Vitals and nursing note reviewed.  Constitutional:      Appearance: Normal appearance.  Cardiovascular:     Rate and Rhythm: Normal rate and regular rhythm.     Heart sounds: No murmur heard.    No gallop.  Pulmonary:     Effort: Pulmonary effort is normal.     Breath sounds: Normal breath sounds. No wheezing or rales.  Musculoskeletal:     Right lower leg: No edema.     Left lower leg: No edema.  Skin:    General: Skin is warm  and dry.  Neurological:     General: No focal deficit present.     Mental Status: He is alert and oriented to person, place, and time. Mental status is at baseline.  Psychiatric:        Mood and Affect: Mood normal.        Behavior: Behavior normal.     Wt Readings from Last 3 Encounters:  05/25/23 117 lb 3.2 oz (53.2 kg)  04/11/23 116 lb 8 oz (52.8 kg)  01/11/23 122 lb 3.2 oz (55.4 kg)     ASSESSMENT & PLAN:   Pre-operative cardiac risk assessment - Patient is scheduled for routine screening colonoscopy 07/10/2023. According to the RCRI, patient has a 6% risk of MACE. Patient reports activity equivalent to 4.0 METS (walking 2 blocks, climbing stairs). Based on ACC/AHA guidelines, Mr. Massaquoi would be at acceptable risk for the planned procedure without further cardiovascular testing.   Coronary artery disease - Patient is doing well from a cardiac perspective without symptoms of angina and cardiac decompensation. Continue ASA and statin.   Essential hypertension - BP well controlled. Continue metoprolol .   Tobacco use - Recommend cessation.   NSVT - Noted during hospitalization in the setting of MI. No symptoms that would indicate recurrence. Continue metoprolol .    Disposition: F/u with Dr. Alvenia Aus or an APP in 1 year.   Medication Adjustments/Labs and Tests Ordered: Current medicines are reviewed at length with the patient today.  Concerns regarding medicines are outlined above. Medication changes, Labs and Tests ordered today are summarized above and listed in the Patient Instructions accessible in Encounters.   Beather Liming, PA-C 05/25/2023 12:23 PM     [facility] [place] [address] [place], [place] [address] [phone]

## 2023-05-24 NOTE — Progress Notes (Signed)
 MRN : [id number]  Brent [name] is a 78 y.o. (1945/08/18) male who presents with chief complaint of check circulation.  History of Present Illness:   The patient returns to the office for followup and review of the noninvasive studies.    There have been no interval changes in lower extremity symptoms. No interval shortening of the patient's claudication distance or development of rest pain symptoms. No new ulcers or wounds have occurred since the last visit.   There have been no significant changes to the patient's overall health care.   The patient denies amaurosis fugax or recent TIA symptoms. There are no documented recent neurological changes noted. There is no history of DVT, PE or superficial thrombophlebitis. The patient denies recent episodes of angina or shortness of breath.    ABI Rt=1.01 (triphasic) and Lt=0.84 (biphasic)  (previous ABI's Rt=0.97 (triphasic) and Lt=0.75 (biphasic))  No outpatient medications have been marked as taking for the 05/28/23 encounter (Appointment) with Prescilla Brod, Ninette Basque, MD.    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   Stroke [facility])    Tobacco  abuse     Past Surgical History:  Procedure Laterality Date   COLONOSCOPY WITH PROPOFOL  N/A 09/22/2014   Procedure: COLONOSCOPY WITH PROPOFOL ;  Surgeon: Marnee Sink, MD;  Location: [facility] ENDOSCOPY;  Service: Endoscopy;  Laterality: N/A;   COLONOSCOPY WITH PROPOFOL  N/A 07/09/2018   Procedure: COLONOSCOPY WITH PROPOFOL ;  Surgeon: Marnee Sink, MD;  Location: [facility] ENDOSCOPY;  Service: Endoscopy;  Laterality: N/A;   DEEP NECK LYMPH NODE BIOPSY / EXCISION Right    HERNIA REPAIR  1988   LEFT HEART CATH AND CORONARY ANGIOGRAPHY N/A 12/01/2019   Procedure: LEFT HEART CATH AND CORONARY ANGIOGRAPHY;  Surgeon: Wenona Hamilton, MD;  Location: [facility] INVASIVE CV LAB;  Service: Cardiovascular;  Laterality: N/A;   LYMPH NODE BIOPSY     NM MYOVIEW  ([facility] HX)  10/09/2013   Dr. Burney Carter: EF 58%. Normal wall motion. No ischemia or infarction.   shoulder     SHOULDER ARTHROSCOPY Right    TRANSTHORACIC ECHOCARDIOGRAM  09/24/2013   EF 45-50% with mild global hypokinesis. GR 1 DD. No valvular lesions besides mild TR. Normal RV pressures.    Social History Social History   Tobacco Use   Smoking status:  Some Days    Current packs/day: 0.15    Average packs/day: 0.2 packs/day for 61.4 years (9.2 ttl pk-yrs)    Types: Cigarettes    Start date: 1964   Smokeless tobacco: Never   Tobacco comments:    New quit date 02/24/20 - made 02/05/20; currently smoking 3 cigs per week 05/20/20  Vaping Use   Vaping status: Never Used  Substance Use Topics   Alcohol use: No   Drug use: No    Family History Family History  Problem Relation Age of Onset   Alcohol abuse Mother    Healthy Daughter    Parkinson's disease Other    Stroke Other    Diabetes Sister    Heart disease Other    Heart disease Other    Alcohol abuse Brother    Cancer Paternal Grandfather        pancreatic   Heart disease Sister    Heart disease Sister    Healthy Son    Healthy Daughter    Bladder Cancer Neg Hx    Kidney cancer Neg  Hx     Allergies  Allergen Reactions   Influenza Vaccines     GUILLAIN BARRE SYNDROME    Pneumococcal Vaccine Other (See Comments)    GUILLAIN BARRE SYNDROME NUMBNESS, SOB,CHEST PAIN    Prevnar [Pneumococcal 13-Val Conj Vacc] Other (See Comments)    GUILLAIN-BARRE SYNDROME     REVIEW OF SYSTEMS (Negative unless checked)  Constitutional: [] Weight loss  [] Fever  [] Chills Cardiac: [] Chest pain   [] Chest pressure   [] Palpitations   [] Shortness of breath when laying flat   [] Shortness of breath with exertion. Vascular:  [x] Pain in legs with walking   [] Pain in legs at rest  [] History of DVT   [] Phlebitis   [] Swelling in legs   [] Varicose veins   [] Non-healing ulcers Pulmonary:   [] Uses home oxygen   [] Productive cough   [] Hemoptysis   [] Wheeze  [] COPD   [] Asthma Neurologic:  [] Dizziness   [] Seizures   [] History of stroke   [] History of TIA  [] Aphasia   [] Vissual changes   [] Weakness or numbness in arm   [] Weakness or numbness in leg Musculoskeletal:   [] Joint swelling   [] Joint pain   [] Low back pain Hematologic:  [] Easy bruising  [] Easy bleeding   [] Hypercoagulable state   [] Anemic Gastrointestinal:  [] Diarrhea   [] Vomiting  [] Gastroesophageal reflux/heartburn   [] Difficulty swallowing. Genitourinary:  [] Chronic kidney disease   [] Difficult urination  [] Frequent urination   [] Blood in urine Skin:  [] Rashes   [] Ulcers  Psychological:  [] History of anxiety   []  History of major depression.  Physical Examination  There were no vitals filed for this visit. There is no height or weight on file to calculate BMI. Gen: WD/WN, NAD Head: [place]/AT, No temporalis wasting.  Ear/Nose/Throat: Hearing grossly intact, nares w/o erythema or drainage Eyes: PER, EOMI, sclera nonicteric.  Neck: Supple, no masses.  No bruit or JVD.  Pulmonary:  Good air movement, no audible wheezing, no use of accessory muscles.  Cardiac: RRR, normal S1, S2, no Murmurs. Vascular:  mild trophic changes, no open  wounds Vessel Right Left  Radial Palpable Palpable  PT Not Palpable Not Palpable  DP Not Palpable Not Palpable  Gastrointestinal: soft, non-distended. No guarding/no peritoneal signs.  Musculoskeletal: M/S 5/5 throughout.  No visible deformity.  Neurologic: CN 2-12 intact. Pain and light touch intact in extremities.  Symmetrical.  Speech is fluent. Motor exam as listed above. Psychiatric: Judgment intact,  Mood & affect appropriate for pt's clinical situation. Dermatologic: No rashes or ulcers noted.  No changes consistent with cellulitis.   CBC Lab Results  Component Value Date   WBC 3.7 (L) 04/11/2023   HGB 15.3 04/11/2023   HCT 45.8 04/11/2023   MCV 93.7 04/11/2023   PLT 240 04/11/2023    BMET    Component Value Date/Time   NA 138 04/11/2023 1029   NA 133 (L) 08/07/2013 0401   K 4.6 04/11/2023 1029   K 3.9 08/07/2013 0401   CL 102 04/11/2023 1029   CL 98 08/07/2013 0401   CO2 28 04/11/2023 1029   CO2 28 08/07/2013 0401   GLUCOSE 96 04/11/2023 1029   GLUCOSE 90 08/07/2013 0401   BUN 15 04/11/2023 1029   BUN 14 08/07/2013 0401   CREATININE 0.82 04/11/2023 1029   CALCIUM  9.3 04/11/2023 1029   CALCIUM  9.0 08/07/2013 0401   GFRNONAA >60 06/29/2021 1215   GFRNONAA 89 10/29/2019 0903   GFRAA 103 10/29/2019 0903   CrCl cannot be calculated (Patient's most recent lab result is older than the maximum 21 days allowed.).  COAG Lab Results  Component Value Date   INR 1.2 11/30/2019   INR 1.1 08/02/2013    Radiology No results found.   Assessment/Plan 1. Atherosclerosis of native artery of both lower extremities with intermittent claudication (HCC) (Primary) Recommend:   The patient has evidence of atherosclerosis of the lower extremities with claudication.  The patient does not voice lifestyle limiting changes at this point in time.   Noninvasive studies do not suggest clinically significant change.   No invasive studies, angiography or surgery at this time The  patient should continue walking and begin a more formal exercise program.  The patient should continue antiplatelet therapy and aggressive treatment of the lipid abnormalities   No changes in the patient's medications at this time   Continued surveillance is indicated as atherosclerosis is likely to progress with time.     The patient will continue follow up with noninvasive studies as ordered.  - VAS US  ABI WITH/WO TBI; Future  2. Stenosis of carotid artery, unspecified laterality Recommend:   Given the patient's asymptomatic subcritical stenosis no further invasive testing or surgery at this time.   Continue antiplatelet therapy as prescribed Continue management of CAD, HTN and Hyperlipidemia Healthy heart diet,  encouraged exercise at least 4 times per week Follow up in 12 months with duplex ultrasound and physical exam   3. Coronary artery disease involving native coronary artery of native heart with angina pectoris (HCC) Continue cardiac and antihypertensive medications as already ordered and reviewed, no changes at this time.  Continue statin as ordered and reviewed, no changes at this time  Nitrates PRN for chest pain  4. Primary hypertension Continue antihypertensive medications as already ordered, these medications have been reviewed and there are no changes at this time.  5. Centrilobular emphysema (HCC) Continue pulmonary medications and aerosols as already ordered, these medications have been reviewed and there are no changes at this time.     Devon Fogo, MD  05/24/2023 12:47 PM

## 2023-05-25 ENCOUNTER — Other Ambulatory Visit (INDEPENDENT_AMBULATORY_CARE_PROVIDER_SITE_OTHER): Payer: Self-pay | Admitting: Vascular Surgery

## 2023-05-25 ENCOUNTER — Encounter: Payer: Self-pay | Admitting: Physician Assistant

## 2023-05-25 ENCOUNTER — Ambulatory Visit: Attending: Physician Assistant | Admitting: Physician Assistant

## 2023-05-25 VITALS — BP 116/62 | HR 73 | Ht 69.5 in | Wt 117.2 lb

## 2023-05-25 DIAGNOSIS — I2510 Atherosclerotic heart disease of native coronary artery without angina pectoris: Secondary | ICD-10-CM

## 2023-05-25 DIAGNOSIS — F17210 Nicotine dependence, cigarettes, uncomplicated: Secondary | ICD-10-CM

## 2023-05-25 DIAGNOSIS — I10 Essential (primary) hypertension: Secondary | ICD-10-CM

## 2023-05-25 DIAGNOSIS — Z01810 Encounter for preprocedural cardiovascular examination: Secondary | ICD-10-CM | POA: Diagnosis not present

## 2023-05-25 DIAGNOSIS — I472 Ventricular tachycardia, unspecified: Secondary | ICD-10-CM | POA: Diagnosis not present

## 2023-05-25 DIAGNOSIS — I739 Peripheral vascular disease, unspecified: Secondary | ICD-10-CM

## 2023-05-25 NOTE — Patient Instructions (Signed)
 Medication Instructions:  Your Physician recommend you continue on your current medication as directed.    *If you need a refill on your cardiac medications before your next appointment, please call your pharmacy*  Lab Work: No labs ordered today  If you have labs (blood work) drawn today and your tests are completely normal, you will receive your results only by: MyChart Message (if you have MyChart) OR A paper copy in the mail If you have any lab test that is abnormal or we need to change your treatment, we will call you to review the results.  Testing/Procedures: No test ordered today   Follow-Up: At [facility], you and your health needs are our priority.  As part of our continuing mission to provide you with exceptional heart care, our providers are all part of one team.  This team includes your primary Cardiologist (physician) and Advanced Practice Providers or APPs (Physician Assistants and Nurse Practitioners) who all work together to provide you with the care you need, when you need it.  Your next appointment:   1 year(s)  Provider:   Varney Gentleman, PA-C

## 2023-05-28 ENCOUNTER — Ambulatory Visit (INDEPENDENT_AMBULATORY_CARE_PROVIDER_SITE_OTHER): Payer: Medicare PPO

## 2023-05-28 ENCOUNTER — Encounter (INDEPENDENT_AMBULATORY_CARE_PROVIDER_SITE_OTHER): Payer: Self-pay | Admitting: Vascular Surgery

## 2023-05-28 ENCOUNTER — Ambulatory Visit (INDEPENDENT_AMBULATORY_CARE_PROVIDER_SITE_OTHER): Payer: Medicare PPO | Admitting: Vascular Surgery

## 2023-05-28 VITALS — BP 131/69 | HR 70 | Resp 16 | Ht 69.0 in | Wt 118.4 lb

## 2023-05-28 DIAGNOSIS — I70213 Atherosclerosis of native arteries of extremities with intermittent claudication, bilateral legs: Secondary | ICD-10-CM

## 2023-05-28 DIAGNOSIS — I6529 Occlusion and stenosis of unspecified carotid artery: Secondary | ICD-10-CM

## 2023-05-28 DIAGNOSIS — J432 Centrilobular emphysema: Secondary | ICD-10-CM

## 2023-05-28 DIAGNOSIS — I739 Peripheral vascular disease, unspecified: Secondary | ICD-10-CM

## 2023-05-28 DIAGNOSIS — I10 Essential (primary) hypertension: Secondary | ICD-10-CM | POA: Diagnosis not present

## 2023-05-28 DIAGNOSIS — I25119 Atherosclerotic heart disease of native coronary artery with unspecified angina pectoris: Secondary | ICD-10-CM | POA: Diagnosis not present

## 2023-05-28 LAB — VAS US ABI WITH/WO TBI
Left ABI: 0.84
Right ABI: 1.01

## 2023-05-29 ENCOUNTER — Encounter (INDEPENDENT_AMBULATORY_CARE_PROVIDER_SITE_OTHER): Payer: Self-pay

## 2023-05-29 NOTE — Telephone Encounter (Signed)
 Per Chart note dated 05/25/23  Pre-operative cardiac risk assessment - Patient is scheduled for routine screening colonoscopy 07/10/2023. According to the RCRI, patient has a 6% risk of MACE. Patient reports activity equivalent to 4.0 METS (walking 2 blocks, climbing stairs). Based on ACC/AHA guidelines, Brent [name] would be at acceptable risk for the planned procedure without further cardiovascular testing.   Clearance notes received by fax on 05/25/23.  Thanks, Beatrice, CMA

## 2023-05-31 ENCOUNTER — Ambulatory Visit: Payer: Medicare PPO

## 2023-05-31 DIAGNOSIS — Z0000 Encounter for general adult medical examination without abnormal findings: Secondary | ICD-10-CM | POA: Diagnosis not present

## 2023-05-31 NOTE — Progress Notes (Signed)
 Subjective:   Brent [name] is a 78 y.o. who presents for a Medicare Wellness preventive visit.  As a reminder, Annual Wellness Visits don't include a physical exam, and some assessments may be limited, especially if this visit is performed virtually. We may recommend an in-person follow-up visit with your provider if needed.  Visit Complete: Virtual I connected with  Brent [name] on 05/31/23 by a audio enabled telemedicine application and verified that I am speaking with the correct person using two identifiers.  Patient Location: Home  Provider Location: Office/Clinic  I discussed the limitations of evaluation and management by telemedicine. The patient expressed understanding and agreed to proceed.  Vital Signs: Because this visit was a virtual/telehealth visit, some criteria may be missing or patient reported. Any vitals not documented were not able to be obtained and vitals that have been documented are patient reported.  VideoDeclined- This patient declined Librarian, academic. Therefore the visit was completed with audio only.  Persons Participating in Visit: Patient.  AWV Questionnaire: No: Patient Medicare AWV questionnaire was not completed prior to this visit.  Cardiac Risk Factors include: advanced age (>30men, >49 women);dyslipidemia;male gender;hypertension;smoking/ tobacco exposure     Objective:     There were no vitals filed for this visit. There is no height or weight on file to calculate BMI.     05/31/2023    8:56 AM 05/26/2022    8:35 AM 06/29/2021   12:17 PM 05/24/2021    8:31 AM 05/20/2020    8:26 AM 12/12/2019    2:16 PM 11/30/2019   12:48 PM  Advanced Directives  Does Patient Have a Medical Advance Directive? No No No No No No No  Would patient like information on creating a medical advance directive? No - Patient declined  No - Patient declined Yes (MAU/Ambulatory/Procedural Areas - Information given) Yes  (MAU/Ambulatory/Procedural Areas - Information given) No - Patient declined No - Patient declined    Current Medications (verified) Outpatient Encounter Medications as of 05/31/2023  Medication Sig   acetaminophen  (TYLENOL ) 325 MG tablet Take 650 mg by mouth every 6 (six) hours as needed.    albuterol  (VENTOLIN  HFA) 108 (90 Base) MCG/ACT inhaler Inhale 2 puffs into the lungs every 6 (six) hours as needed for wheezing or shortness of breath.   aspirin  EC 81 MG EC tablet Take 1 tablet (81 mg total) by mouth daily.   atorvastatin  (LIPITOR ) 40 MG tablet Take 1 tablet (40 mg total) by mouth daily.   fluticasone  (FLONASE ) 50 MCG/ACT nasal spray Place 2 sprays into both nostrils daily.   fluticasone -salmeterol (ADVAIR) 100-50 MCG/ACT AEPB Inhale 1 puff into the lungs 2 (two) times daily.   gabapentin  (NEURONTIN ) 100 MG capsule Take 1 capsule (100 mg total) by mouth daily as needed.   hydrocortisone  1 % ointment Apply 1 Application topically 2 (two) times daily.   metoprolol  succinate (TOPROL -XL) 25 MG 24 hr tablet TAKE 1/2 TABLET(12.5 MG) BY MOUTH DAILY   No facility-administered encounter medications on file as of 05/31/2023.    Allergies (verified) Influenza vaccines, Pneumococcal vaccine, and Prevnar [pneumococcal 13-val conj vacc]   History: Past Medical History:  Diagnosis Date   Allergy    Aortic calcification (HCC) 07/2015   Noted on chest CT with contrast   Cataract    Centrilobular emphysema (HCC) 08/03/2015   Noted on chest CT July 2017   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   Coronary artery calcification seen on CAT scan 07/29/2015  Noted on CT scan July 2017   Guillain Barr syndrome [facility]) 07/2013   follow Prevnar vaccine   Guillain Barr syndrome [facility])    History of colonic polyps    Hyperlipidemia    Hypochloremia    Osteopenia 08/29/2016   August 2018; next scan August 2020   Primary hypertension 01/11/2023   Right Supraclavicular Lymph node 08/03/2015   Solitary  pulmonary nodule 08/03/2015   CT Chest 07/2015 - Subpleural 3 mm left upper lobe pulmonary nodule, probably benign.   Stroke [facility])    Tobacco abuse    Past Surgical History:  Procedure Laterality Date   COLONOSCOPY WITH PROPOFOL  N/A 09/22/2014   Procedure: COLONOSCOPY WITH PROPOFOL ;  Surgeon: Marnee Sink, MD;  Location: [facility] ENDOSCOPY;  Service: Endoscopy;  Laterality: N/A;   COLONOSCOPY WITH PROPOFOL  N/A 07/09/2018   Procedure: COLONOSCOPY WITH PROPOFOL ;  Surgeon: Marnee Sink, MD;  Location: [facility] ENDOSCOPY;  Service: Endoscopy;  Laterality: N/A;   DEEP NECK LYMPH NODE BIOPSY / EXCISION Right    HERNIA REPAIR  1988   LEFT HEART CATH AND CORONARY ANGIOGRAPHY N/A 12/01/2019   Procedure: LEFT HEART CATH AND CORONARY ANGIOGRAPHY;  Surgeon: Wenona Hamilton, MD;  Location: [facility] INVASIVE CV LAB;  Service: Cardiovascular;  Laterality: N/A;   LYMPH NODE BIOPSY     NM MYOVIEW  ([facility] HX)  10/09/2013   Dr. Burney Carter: EF 58%. Normal wall motion. No ischemia or infarction.   shoulder     SHOULDER ARTHROSCOPY Right    TRANSTHORACIC ECHOCARDIOGRAM  09/24/2013   EF 45-50% with mild global hypokinesis. GR 1 DD. No valvular lesions besides mild TR. Normal RV pressures.   Family History  Problem Relation Age of Onset   Alcohol abuse Mother    Healthy Daughter    Parkinson's disease Other    Stroke Other    Diabetes Sister    Heart disease Other    Heart disease Other    Alcohol abuse Brother    Cancer Paternal Grandfather        pancreatic   Heart disease Sister    Heart disease Sister    Healthy Son    Healthy Daughter    Bladder Cancer Neg Hx    Kidney cancer Neg Hx    Social History   Socioeconomic History   Marital status: Married    Spouse name: Alvy Baar   Number of children: 3   Years of education: some college   Highest education level: 12th grade  Occupational History   Occupation: Retired  Tobacco Use   Smoking status: Some Days    Current packs/day: 0.15    Average  packs/day: 0.2 packs/day for 61.4 years (9.2 ttl pk-yrs)    Types: Cigarettes    Start date: 1964   Smokeless tobacco: Never   Tobacco comments:    New quit date 02/24/20 - made 02/05/20; currently smoking 3 cigs per week 05/20/20  Vaping Use   Vaping status: Never Used  Substance and Sexual Activity   Alcohol use: No   Drug use: No   Sexual activity: Not Currently    Partners: Female  Other Topics Concern   Not on file  Social History Narrative   Current tobacco user,3 cigarettes per week, previously 2 packs per day for 52 years, formerly worked as a Naval architect, has dogs at home.   Social Drivers of Health   Financial Resource Strain: Low Risk  (05/31/2023)   Overall Financial Resource Strain (CARDIA)    Difficulty of Paying  Living Expenses: Not very hard  Food Insecurity: Food Insecurity Present (05/31/2023)   Hunger Vital Sign    Worried About Running Out of Food in the Last Year: Sometimes true    Ran Out of Food in the Last Year: Sometimes true  Transportation Needs: No Transportation Needs (05/31/2023)   PRAPARE - Administrator, Civil Service (Medical): No    Lack of Transportation (Non-Medical): No  Physical Activity: Insufficiently Active (05/31/2023)   Exercise Vital Sign    Days of Exercise per Week: 3 days    Minutes of Exercise per Session: 20 min  Stress: No Stress Concern Present (05/31/2023)   [employer] of Occupational Health - Occupational Stress Questionnaire    Feeling of Stress : Only a little  Social Connections: Socially Integrated (05/31/2023)   Social Connection and Isolation Panel [NHANES]    Frequency of Communication with Friends and Family: More than three times a week    Frequency of Social Gatherings with Friends and Family: Once a week    Attends Religious Services: More than 4 times per year    Active Member of [employer] or Organizations: Yes    Attends Engineer, structural: More than 4 times per year    Marital Status:  Married    Tobacco Counseling Ready to quit: Not Answered Counseling given: Not Answered Tobacco comments: New quit date 02/24/20 - made 02/05/20; currently smoking 3 cigs per week 05/20/20    Clinical Intake:  Pre-visit preparation completed: Yes  Pain : No/denies pain     BMI - recorded: 17.4 Nutritional Status: BMI <19  Underweight Nutritional Risks: None Diabetes: No  Lab Results  Component Value Date   HGBA1C 5.4 03/29/2022   HGBA1C 5.1 09/28/2021   HGBA1C 5.0 09/23/2020     How often do you need to have someone help you when you read instructions, pamphlets, or other written materials from your doctor or pharmacy?: 1 - Never  Interpreter Needed?: No  Information entered by :: Dellie Fergusson, LPN   Activities of Daily Living    05/31/2023    8:57 AM 05/30/2023    8:27 PM  In your present state of health, do you have any difficulty performing the following activities:  Hearing? 0 0  Vision? 0 0  Difficulty concentrating or making decisions? 0 0  Walking or climbing stairs? 0 0  Dressing or bathing? 0 0  Doing errands, shopping? 0 0  Preparing Food and eating ? N N  Using the Toilet? N N  In the past six months, have you accidently leaked urine? N N  Do you have problems with loss of bowel control? N N  Managing your Medications? N N  Managing your Finances? N N  Housekeeping or managing your Housekeeping? N N    Patient Care Team: Rockney Cid, DO as PCP - General (Internal Medicine) Wenona Hamilton, MD as PCP - Cardiology (Cardiology) Wenona Hamilton, MD as Consulting Physician (Cardiology) Rosa, [place], LCSW as Social Worker Pa, [facility]  Indicate any recent Medical Services you may have received from other than [facility] providers in the past year (date may be approximate).     Assessment:    This is a routine wellness examination for Darrius.  Hearing/Vision screen Hearing Screening - Comments:: NO AIDS Vision Screening  - Comments:: READERS- PATTY VISION   Goals Addressed             This Visit's Progress  DIET - EAT MORE FRUITS AND VEGETABLES         Depression Screen     05/31/2023    8:54 AM 01/11/2023    9:32 AM 10/19/2022    9:57 AM 08/31/2022   11:17 AM 06/29/2022   10:33 AM 06/02/2022    9:00 AM 05/26/2022    8:27 AM  PHQ 2/9 Scores  PHQ - 2 Score 0 0 0 0 0 0 1  PHQ- 9 Score 0  0 0 0 0 7    Fall Risk     05/31/2023    8:57 AM 05/30/2023    8:27 PM 01/11/2023    9:31 AM 10/19/2022    9:57 AM 10/10/2022   10:00 AM  Fall Risk   Falls in the past year? 0 0 0 0 0  Number falls in past yr: 0 0 0  0  Injury with Fall? 0 0 0  0  Risk for fall due to : No Fall Risks  No Fall Risks No Fall Risks   Follow up Falls evaluation completed  Falls prevention discussed;Education provided;Falls evaluation completed Falls prevention discussed     MEDICARE RISK AT HOME:  Medicare Risk at Home Any stairs in or around the home?: Yes If so, are there any without handrails?: No Home free of loose throw rugs in walkways, pet beds, electrical cords, etc?: Yes Adequate lighting in your home to reduce risk of falls?: Yes Life alert?: No Use of a cane, walker or w/c?: No Grab bars in the bathroom?: Yes Shower chair or bench in shower?: No Elevated toilet seat or a handicapped toilet?: No  TIMED UP AND GO:  Was the test performed?  No  Cognitive Function: 6CIT completed        05/31/2023    8:58 AM 05/26/2022    8:46 AM 05/20/2020    8:29 AM 05/20/2019    8:26 AM 05/09/2018   11:38 AM  6CIT Screen  What Year? 0 points 0 points 0 points 0 points 0 points  What month? 0 points 0 points 0 points 0 points 0 points  What time? 0 points 0 points 0 points 0 points 0 points  Count back from 20 0 points 0 points 0 points 0 points 0 points  Months in reverse 0 points 0 points 2 points 0 points 0 points  Repeat phrase 2 points 0 points 0 points 2 points 0 points  Total Score 2 points 0 points 2 points 2  points 0 points    Immunizations Immunization History  Administered Date(s) Administered   Pneumococcal Conjugate-13 07/25/2013   Td 02/18/2003   Tdap 01/09/2009    Screening Tests Health Maintenance  Topic Date Due   COVID-19 Vaccine (1 - 2024-25 season) Never done   Colonoscopy  07/09/2023   Zoster Vaccines- Shingrix (1 of 2) 07/11/2023 (Originally 09/11/1996)   Medicare Annual Wellness (AWV)  05/30/2024   Hepatitis C Screening  Completed   HPV VACCINES  Aged Out   Meningococcal B Vaccine  Aged Out   DTaP/Tdap/Td  Discontinued   Pneumonia Vaccine 49+ Years old  Discontinued   INFLUENZA VACCINE  Discontinued    Health Maintenance  Health Maintenance Due  Topic Date Due   COVID-19 Vaccine (1 - 2024-25 season) Never done   Colonoscopy  07/09/2023   Health Maintenance Items Addressed: DECLINES ALL SHOTS; HAS COLONOSCOPY SCHEDULED IN JULY  Additional Screening:  Vision Screening: Recommended annual ophthalmology exams for early detection of glaucoma and  other disorders of the eye.  Dental Screening: Recommended annual dental exams for proper oral hygiene  Community Resource Referral / Chronic Care Management: CRR required this visit?  No   CCM required this visit?  No   Plan:    I have personally reviewed and noted the following in the patient's chart:   Medical and social history Use of alcohol, tobacco or illicit drugs  Current medications and supplements including opioid prescriptions. Patient is not currently taking opioid prescriptions. Functional ability and status Nutritional status Physical activity Advanced directives List of other physicians Hospitalizations, surgeries, and ER visits in previous 12 months Vitals Screenings to include cognitive, depression, and falls Referrals and appointments  In addition, I have reviewed and discussed with patient certain preventive protocols, quality metrics, and best practice recommendations. A written  personalized care plan for preventive services as well as general preventive health recommendations were provided to patient.   Pinky Bright, LPN   [id number]   After Visit Summary: (MyChart) Due to this being a telephonic visit, the after visit summary with patients personalized plan was offered to patient via MyChart   Notes: Nothing significant to report at this time.

## 2023-05-31 NOTE — Patient Instructions (Addendum)
 Mr. Brent [name] , Thank you for taking time out of your busy schedule to complete your Annual Wellness Visit with me. I enjoyed our conversation and look forward to speaking with you again next year. I, as well as your care team,  appreciate your ongoing commitment to your health goals. Please review the following plan we discussed and let me know if I can assist you in the future.  Follow up Visits: Next Medicare AWV with our clinical staff: 06/19/24 @ 9:30 AM BY PHONE   Have you seen your provider in the last 6 months (3 months if uncontrolled diabetes)? Yes   Clinician Recommendations:  Aim for 30 minutes of exercise or brisk walking, 6-8 glasses of water, and 5 servings of fruits and vegetables each day. TAKE CARE!      This is a list of the screening recommended for you and due dates:  Health Maintenance  Topic Date Due   COVID-19 Vaccine (1 - 2024-25 season) Never done   Colon Cancer Screening  07/09/2023   Zoster (Shingles) Vaccine (1 of 2) 07/11/2023*   Medicare Annual Wellness Visit  05/30/2024   Hepatitis C Screening  Completed   HPV Vaccine  Aged Out   Meningitis B Vaccine  Aged Out   DTaP/Tdap/Td vaccine  Discontinued   Pneumonia Vaccine  Discontinued   Flu Shot  Discontinued  *Topic was postponed. The date shown is not the original due date.    Advanced directives: (ACP Link)Information on Advanced Care Planning can be found at   Secretary of [facility] Advance Health Care Directives Advance Health Care Directives. [email or web address]  Advance Care Planning is important because it:  [x]  Makes sure you receive the medical care that is consistent with your values, goals, and preferences  [x]  It provides guidance to your family and loved ones and reduces their decisional burden about whether or not they are making the right decisions based on your wishes.  Follow the link provided in your after visit summary or read over the paperwork we have mailed to you to help you started  getting your Advance Directives in place. If you need assistance in completing these, please reach out to us  so that we can help you!

## 2023-06-20 NOTE — Telephone Encounter (Signed)
 okay

## 2023-07-10 ENCOUNTER — Other Ambulatory Visit: Payer: Self-pay

## 2023-07-10 ENCOUNTER — Encounter: Payer: Self-pay | Admitting: Gastroenterology

## 2023-07-10 ENCOUNTER — Ambulatory Visit: Admitting: Certified Registered"

## 2023-07-10 ENCOUNTER — Encounter
Admission: RE | Disposition: A | Discharge status: Home / Self Care | Payer: Self-pay | Source: Home / Self Care | Attending: Gastroenterology

## 2023-07-10 ENCOUNTER — Ambulatory Visit
Admission: RE | Admit: 2023-07-10 | Discharge: 2023-07-10 | Disposition: A | Discharge status: Home / Self Care | Attending: Gastroenterology | Admitting: Gastroenterology

## 2023-07-10 DIAGNOSIS — I10 Essential (primary) hypertension: Secondary | ICD-10-CM | POA: Insufficient documentation

## 2023-07-10 DIAGNOSIS — J432 Centrilobular emphysema: Secondary | ICD-10-CM | POA: Insufficient documentation

## 2023-07-10 DIAGNOSIS — D124 Benign neoplasm of descending colon: Secondary | ICD-10-CM | POA: Insufficient documentation

## 2023-07-10 DIAGNOSIS — D125 Benign neoplasm of sigmoid colon: Secondary | ICD-10-CM | POA: Insufficient documentation

## 2023-07-10 DIAGNOSIS — Z860100 Personal history of colon polyps, unspecified: Secondary | ICD-10-CM

## 2023-07-10 DIAGNOSIS — I252 Old myocardial infarction: Secondary | ICD-10-CM | POA: Insufficient documentation

## 2023-07-10 DIAGNOSIS — D120 Benign neoplasm of cecum: Secondary | ICD-10-CM | POA: Insufficient documentation

## 2023-07-10 DIAGNOSIS — K641 Second degree hemorrhoids: Secondary | ICD-10-CM | POA: Insufficient documentation

## 2023-07-10 DIAGNOSIS — Z8673 Personal history of transient ischemic attack (TIA), and cerebral infarction without residual deficits: Secondary | ICD-10-CM | POA: Insufficient documentation

## 2023-07-10 DIAGNOSIS — F17210 Nicotine dependence, cigarettes, uncomplicated: Secondary | ICD-10-CM | POA: Diagnosis not present

## 2023-07-10 DIAGNOSIS — K5730 Diverticulosis of large intestine without perforation or abscess without bleeding: Secondary | ICD-10-CM | POA: Diagnosis not present

## 2023-07-10 DIAGNOSIS — I2510 Atherosclerotic heart disease of native coronary artery without angina pectoris: Secondary | ICD-10-CM | POA: Diagnosis not present

## 2023-07-10 DIAGNOSIS — Z1211 Encounter for screening for malignant neoplasm of colon: Secondary | ICD-10-CM | POA: Insufficient documentation

## 2023-07-10 DIAGNOSIS — K635 Polyp of colon: Secondary | ICD-10-CM

## 2023-07-10 HISTORY — PX: COLONOSCOPY: SHX5424

## 2023-07-10 HISTORY — PX: POLYPECTOMY: SHX149

## 2023-07-10 SURGERY — COLONOSCOPY
Anesthesia: General

## 2023-07-10 MED ORDER — PROPOFOL 10 MG/ML IV BOLUS
INTRAVENOUS | Status: AC
  Filled 2023-07-10: qty 20

## 2023-07-10 MED ORDER — LIDOCAINE HCL (PF) 2 % IJ SOLN
INTRAMUSCULAR | Status: AC
Start: 2023-07-10 — End: 2023-07-10
  Filled 2023-07-10: qty 5

## 2023-07-10 MED ORDER — PROPOFOL 10 MG/ML IV BOLUS
INTRAVENOUS | Status: DC | PRN
  Administered 2023-07-10: 120 ug/kg/min via INTRAVENOUS
  Administered 2023-07-10: 70 mg via INTRAVENOUS

## 2023-07-10 MED ORDER — PROPOFOL 10 MG/ML IV BOLUS
INTRAVENOUS | Status: AC
Start: 2023-07-10 — End: 2023-07-10
  Filled 2023-07-10: qty 20

## 2023-07-10 MED ORDER — LIDOCAINE HCL (PF) 2 % IJ SOLN
INTRAMUSCULAR | Status: DC | PRN
  Administered 2023-07-10: 60 mg via INTRADERMAL

## 2023-07-10 MED ORDER — SODIUM CHLORIDE 0.9 % IV SOLN: INTRAVENOUS | Status: DC

## 2023-07-10 MED ORDER — PROPOFOL 10 MG/ML IV BOLUS
INTRAVENOUS | Status: AC
  Filled 2023-07-10: qty 40

## 2023-07-10 NOTE — Anesthesia Postprocedure Evaluation (Signed)
 Anesthesia Post Note  Patient: Brent [name]  Procedure(s) Performed: COLONOSCOPY POLYPECTOMY, INTESTINE  Patient location during evaluation: Endoscopy Anesthesia Type: General Level of consciousness: awake and alert Pain management: pain level controlled Vital Signs Assessment: post-procedure vital signs reviewed and stable Respiratory status: spontaneous breathing, nonlabored ventilation, respiratory function stable and patient connected to nasal cannula oxygen Cardiovascular status: blood pressure returned to baseline and stable Postop Assessment: no apparent nausea or vomiting Anesthetic complications: no   No notable events documented.   Last Vitals:  Vitals:   07/10/23 0823 07/10/23 0833  BP: 97/61 127/69  Pulse: 73 66  Resp: 11 11  Temp:    SpO2: 100% 100%    Last Pain:  Vitals:   07/10/23 0833  TempSrc:   PainSc: 0-No pain                 Lendia CROME Mae

## 2023-07-10 NOTE — Anesthesia Preprocedure Evaluation (Signed)
 Anesthesia Evaluation  Patient identified by MRN, date of birth, ID band Patient awake    Reviewed: Allergy & Precautions, NPO status , Patient's Chart, lab work & pertinent test results  History of Anesthesia Complications Negative for: history of anesthetic complications  Airway Mallampati: II  TM Distance: >3 FB Neck ROM: full    Dental  (+) Upper Dentures, Lower Dentures   Pulmonary COPD, Current Smoker and Patient abstained from smoking.   Pulmonary exam normal        Cardiovascular hypertension, (-) angina + CAD and + Past MI  Normal cardiovascular exam     Neuro/Psych  PSYCHIATRIC DISORDERS  Depression     Neuromuscular disease CVA    GI/Hepatic negative GI ROS, Neg liver ROS,,,  Endo/Other  negative endocrine ROS    Renal/GU negative Renal ROS  negative genitourinary   Musculoskeletal   Abdominal   Peds  Hematology negative hematology ROS (+)   Anesthesia Other Findings Past Medical History: No date: Allergy 07/2015: Aortic calcification (HCC)     Comment:  Noted on chest CT with contrast No date: Cataract 08/03/2015: Centrilobular emphysema (HCC)     Comment:  Noted on chest CT July 2017 07/29/2015: 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: Coronary artery calcification seen on CAT scan     Comment:  Noted on CT scan July 2017 07/2013: 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 No date: Stroke [facility]) No date: Tobacco abuse  Past Surgical History: 09/22/2014:  COLONOSCOPY WITH PROPOFOL ; N/A     Comment:  Procedure: COLONOSCOPY WITH PROPOFOL ;  Surgeon: Rogelia Copping, MD;  Location: [facility] ENDOSCOPY;  Service: Endoscopy;              Laterality: N/A; 07/09/2018: COLONOSCOPY WITH PROPOFOL ; N/A     Comment:  Procedure: COLONOSCOPY WITH PROPOFOL ;  Surgeon: Copping Rogelia, MD;  Location: [facility] ENDOSCOPY;  Service:               Endoscopy;  Laterality: N/A; No date: DEEP NECK LYMPH NODE BIOPSY / EXCISION; Right 1988: HERNIA REPAIR 12/01/2019: LEFT HEART CATH AND CORONARY ANGIOGRAPHY; N/A     Comment:  Procedure: LEFT HEART CATH AND CORONARY ANGIOGRAPHY;                Surgeon: Darron Deatrice LABOR, MD;  Location: [facility] INVASIVE               CV LAB;  Service: Cardiovascular;  Laterality: N/A; No date: LYMPH NODE BIOPSY 10/09/2013: NM MYOVIEW  ([facility] HX)     Comment:  Dr. Cara Lovelace: EF 58%. Normal wall motion. No               ischemia or infarction. No date: shoulder No date: SHOULDER ARTHROSCOPY; Right 09/24/2013: TRANSTHORACIC ECHOCARDIOGRAM     Comment:  EF 45-50% with mild global hypokinesis. GR 1 DD. No  valvular lesions besides mild TR. Normal RV pressures.  BMI    Body Mass Index: 16.51 kg/m      Reproductive/Obstetrics negative OB ROS                             Anesthesia Physical Anesthesia Plan  ASA: 3  Anesthesia Plan: General   Post-op Pain Management: Minimal or no pain anticipated   Induction: Intravenous  PONV Risk Score and Plan: 1 and Propofol  infusion and TIVA  Airway Management Planned: Natural Airway and Nasal Cannula  Additional Equipment:   Intra-op Plan:   Post-operative Plan:   Informed Consent: I have reviewed the patients History and Physical, chart, labs and discussed the procedure including the risks, benefits and alternatives for the proposed anesthesia with the patient or authorized representative who has indicated his/her understanding  and acceptance.     Dental Advisory Given  Plan Discussed with: Anesthesiologist, CRNA and Surgeon  Anesthesia Plan Comments: (Patient consented for risks of anesthesia including but not limited to:  - adverse reactions to medications - risk of airway placement if required - damage to eyes, teeth, lips or other oral mucosa - nerve damage due to positioning  - sore throat or hoarseness - Damage to heart, brain, nerves, lungs, other parts of body or loss of life  Patient voiced understanding and assent.)       Anesthesia Quick Evaluation

## 2023-07-10 NOTE — Transfer of Care (Signed)
 Immediate Anesthesia Transfer of Care Note  Patient: Brent [name]  Procedure(s) Performed: COLONOSCOPY POLYPECTOMY, INTESTINE  Patient Location: Endoscopy Unit  Anesthesia Type:General  Level of Consciousness: drowsy  Airway & Oxygen Therapy: Patient Spontanous Breathing  Post-op Assessment: Report given to RN and Post -op Vital signs reviewed and stable  Post vital signs: Reviewed and stable  Last Vitals:  Vitals Value Taken Time  BP 118/76 0813  Temp 35.9 0813  Pulse 73 07/10/23 08:14  Resp 11 07/10/23 08:14  SpO2 100 % 07/10/23 08:14  Vitals shown include unfiled device data.  Last Pain:  Vitals:   07/10/23 0813  TempSrc: (P) Temporal  PainSc:          Complications: No notable events documented.

## 2023-07-10 NOTE — Op Note (Signed)
 [facility] Gastroenterology Patient Name: Brent [name] Procedure Date: 07/10/2023 6:53 AM MRN: [id number] Account #: [id number] Date of Birth: 1945-07-09 Admit Type: Outpatient Age: 78 Room: [facility] ENDO ROOM 4 Gender: Male Note Status: Finalized Instrument Name: [id number] [id number] Procedure:             Colonoscopy Indications:           High risk colon cancer surveillance: Personal history                         of colonic polyps Providers:             Rogelia Copping MD, MD Referring MD:          Sharyle Fischer (Referring MD) Medicines:             Propofol  per Anesthesia Complications:         No immediate complications. Procedure:             Pre-Anesthesia Assessment:                        - Prior to the procedure, a History and Physical was                         performed, and patient medications and allergies were                         reviewed. The patient's tolerance of previous                         anesthesia was also reviewed. The risks and benefits                         of the procedure and the sedation options and risks                         were discussed with the patient. All questions were                         answered, and informed consent was obtained. Prior                         Anticoagulants: The patient has taken no anticoagulant                         or antiplatelet agents. ASA Grade Assessment: II - A                         patient with mild systemic disease. After reviewing                         the risks and benefits, the patient was deemed in                         satisfactory condition to undergo the procedure.                        After obtaining informed consent, the colonoscope was  passed under direct vision. Throughout the procedure,                         the patient's blood pressure, pulse, and oxygen                         saturations were monitored continuously. The                          Colonoscope was introduced through the anus and                         advanced to the the cecum, identified by appendiceal                         orifice and ileocecal valve. The colonoscopy was                         performed without difficulty. The patient tolerated                         the procedure well. The quality of the bowel                         preparation was excellent. Findings:      The perianal and digital rectal examinations were normal.      A 3 mm polyp was found in the cecum. The polyp was sessile. The polyp       was removed with a cold snare. Resection and retrieval were complete.      Three sessile polyps were found in the descending colon. The polyps were       3 to 9 mm in size. These polyps were removed with a cold snare.       Resection and retrieval were complete.      A 3 mm polyp was found in the sigmoid colon. The polyp was sessile. The       polyp was removed with a cold snare. Resection and retrieval were       complete.      A few small-mouthed diverticula were found in the sigmoid colon.      Non-bleeding internal hemorrhoids were found during retroflexion. The       hemorrhoids were Grade II (internal hemorrhoids that prolapse but reduce       spontaneously). Impression:            - One 3 mm polyp in the cecum, removed with a cold                         snare. Resected and retrieved.                        - Three 3 to 9 mm polyps in the descending colon,                         removed with a cold snare. Resected and retrieved.                        - One 3 mm polyp in the sigmoid colon, removed with a  cold snare. Resected and retrieved.                        - Diverticulosis in the sigmoid colon.                        - Non-bleeding internal hemorrhoids. Recommendation:        - Discharge patient to home.                        - Resume previous diet.                        - Continue present  medications.                        - Await pathology results.                        - Repeat colonoscopy in 5 years for surveillance. Procedure Code(s):     --- Professional ---                        [phone], Colonoscopy, flexible; with removal of                         tumor(s), polyp(s), or other lesion(s) by snare                         technique Diagnosis Code(s):     --- Professional ---                        Z86.010, Personal history of colonic polyps                        D12.5, Benign neoplasm of sigmoid colon CPT copyright 2022 American Medical Association. All rights reserved. The codes documented in this report are preliminary and upon coder review may  be revised to meet current compliance requirements. Rogelia Copping MD, MD 07/10/2023 8:11:17 AM This report has been signed electronically. Number of Addenda: 0 Note Initiated On: 07/10/2023 6:53 AM Scope Withdrawal Time: 0 hours 8 minutes 6 seconds  Total Procedure Duration: 0 hours 13 minutes 48 seconds  Estimated Blood Loss:  Estimated blood loss: none.      [facility]

## 2023-07-10 NOTE — H&P (Signed)
 Brent Copping, MD [facility] [address] [place], [place] [address] Phone:[phone] Fax : [phone]  Primary Care Physician:  Bernardo Fend, DO Primary Gastroenterologist:  Dr. Copping  Pre-Procedure History & Physical: HPI:  Brent [name] is a 78 y.o. male is here for an colonoscopy.   Past Medical History:  Diagnosis Date   Allergy    Aortic calcification (HCC) 07/2015   Noted on chest CT with contrast   Cataract    Centrilobular emphysema (HCC) 08/03/2015   Noted on chest CT July 2017   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   Coronary artery calcification seen on CAT scan 07/29/2015   Noted on CT scan July 2017   Guillain Barr syndrome [facility]) 07/2013   follow Prevnar vaccine   Guillain Barr syndrome [facility])    History of colonic polyps    Hyperlipidemia    Hypochloremia    Osteopenia 08/29/2016   August 2018; next scan August 2020   Primary hypertension 01/11/2023   Right Supraclavicular Lymph node 08/03/2015   Solitary pulmonary nodule 08/03/2015   CT Chest 07/2015 - Subpleural 3 mm left upper lobe pulmonary nodule, probably benign.   Stroke [facility])    Tobacco abuse     Past Surgical History:  Procedure Laterality Date   COLONOSCOPY WITH PROPOFOL  N/A 09/22/2014   Procedure: COLONOSCOPY WITH PROPOFOL ;  Surgeon: Brent Copping, MD;  Location: [facility] ENDOSCOPY;  Service: Endoscopy;  Laterality: N/A;   COLONOSCOPY WITH PROPOFOL  N/A 07/09/2018   Procedure: COLONOSCOPY WITH PROPOFOL ;  Surgeon: [name] Rogelia, MD;  Location: [facility] ENDOSCOPY;  Service: Endoscopy;  Laterality: N/A;   DEEP NECK LYMPH NODE BIOPSY / EXCISION Right    HERNIA REPAIR  1988   LEFT HEART CATH AND CORONARY ANGIOGRAPHY N/A 12/01/2019   Procedure: LEFT HEART CATH AND CORONARY ANGIOGRAPHY;  Surgeon: Darron Deatrice LABOR, MD;  Location: [facility] INVASIVE CV LAB;  Service: Cardiovascular;  Laterality: N/A;   LYMPH NODE BIOPSY     NM MYOVIEW  ([facility] HX)  10/09/2013   Dr. Cara Lovelace: EF 58%. Normal  wall motion. No ischemia or infarction.   shoulder     SHOULDER ARTHROSCOPY Right    TRANSTHORACIC ECHOCARDIOGRAM  09/24/2013   EF 45-50% with mild global hypokinesis. GR 1 DD. No valvular lesions besides mild TR. Normal RV pressures.    Prior to Admission medications   Medication Sig Start Date End Date Taking? Authorizing Provider  acetaminophen  (TYLENOL ) 325 MG tablet Take 650 mg by mouth every 6 (six) hours as needed.    Yes [provider]  albuterol  (VENTOLIN  HFA) 108 (90 Base) MCG/ACT inhaler Inhale 2 puffs into the lungs every 6 (six) hours as needed for wheezing or shortness of breath. 09/28/21  Yes Bernardo Fend, DO  aspirin  EC 81 MG EC tablet Take 1 tablet (81 mg total) by mouth daily. 08/18/13  Yes Love, Sharlet RAMAN, PA-C  atorvastatin  (LIPITOR ) 40 MG tablet Take 1 tablet (40 mg total) by mouth daily. 04/11/23  Yes Bernardo Fend, DO  fluticasone  (FLONASE ) 50 MCG/ACT nasal spray Place 2 sprays into both nostrils daily. 10/19/22  Yes Bernardo Fend, DO  fluticasone -salmeterol (ADVAIR) 100-50 MCG/ACT AEPB Inhale 1 puff into the lungs 2 (two) times daily. 10/10/22  Yes Bernardo Fend, DO  gabapentin  (NEURONTIN ) 100 MG capsule Take 1 capsule (100 mg total) by mouth daily as needed. 01/11/23  Yes Bernardo Fend, DO  hydrocortisone  1 % ointment Apply 1 Application topically 2 (two) times daily. 04/11/23  Yes Bernardo Fend, DO  metoprolol   succinate (TOPROL -XL) 25 MG 24 hr tablet TAKE 1/2 TABLET(12.5 MG) BY MOUTH DAILY 04/11/23   Bernardo Fend, DO    Allergies as of 05/08/2023 - Review Complete 05/08/2023  Allergen Reaction Noted   Influenza vaccines  05/10/2017   Pneumococcal vaccine Other (See Comments) 10/01/2013   Prevnar [pneumococcal 13-val conj vacc] Other (See Comments) 07/24/2014    Family History  Problem Relation Age of Onset   Alcohol abuse Mother    Healthy Daughter    Parkinson's disease Other    Stroke Other    Diabetes Sister    Heart  disease Other    Heart disease Other    Alcohol abuse Brother    Cancer Paternal Grandfather        pancreatic   Heart disease Sister    Heart disease Sister    Healthy Son    Healthy Daughter    Bladder Cancer Neg Hx    Kidney cancer Neg Hx     Social History   Socioeconomic History   Marital status: Married    Spouse name: Rojelio   Number of children: 3   Years of education: some college   Highest education level: 12th grade  Occupational History   Occupation: Retired  Tobacco Use   Smoking status: Every Day    Current packs/day: 0.15    Average packs/day: 0.2 packs/day for 61.5 years (9.2 ttl pk-yrs)    Types: Cigarettes    Start date: 1964    Passive exposure: Current   Smokeless tobacco: Never   Tobacco comments:    New quit date 02/24/20 - made 02/05/20; currently smoking 3 cigs per week 05/20/20  Vaping Use   Vaping status: Never Used  Substance and Sexual Activity   Alcohol use: No   Drug use: No   Sexual activity: Not Currently    Partners: Female  Other Topics Concern   Not on file  Social History Narrative   Current tobacco user,3 cigarettes per week, previously 2 packs per day for 52 years, formerly worked as a Naval architect, has dogs at home.   Social Drivers of Corporate investment banker Strain: Low Risk  (05/31/2023)   Overall Financial Resource Strain (CARDIA)    Difficulty of Paying Living Expenses: Not very hard  Food Insecurity: Food Insecurity Present (05/31/2023)   Hunger Vital Sign    Worried About Running Out of Food in the Last Year: Sometimes true    Ran Out of Food in the Last Year: Sometimes true  Transportation Needs: No Transportation Needs (05/31/2023)   PRAPARE - Administrator, Civil Service (Medical): No    Lack of Transportation (Non-Medical): No  Physical Activity: Insufficiently Active (05/31/2023)   Exercise Vital Sign    Days of Exercise per Week: 3 days    Minutes of Exercise per Session: 20 min  Stress: No  Stress Concern Present (05/31/2023)   [employer] of Occupational Health - Occupational Stress Questionnaire    Feeling of Stress : Only a little  Social Connections: Socially Integrated (05/31/2023)   Social Connection and Isolation Panel    Frequency of Communication with Friends and Family: More than three times a week    Frequency of Social Gatherings with Friends and Family: Once a week    Attends Religious Services: More than 4 times per year    Active Member of [employer] or Organizations: Yes    Attends Banker Meetings: More than 4 times per year  Marital Status: Married  Catering manager Violence: Not At Risk (05/31/2023)   Humiliation, Afraid, Rape, and Kick questionnaire    Fear of Current or Ex-Partner: No    Emotionally Abused: No    Physically Abused: No    Sexually Abused: No    Review of Systems: See HPI, otherwise negative ROS  Physical Exam: BP 126/73   Pulse 83   Temp (!) 96.4 F (35.8 C) (Temporal)   Resp 20   Ht 5' 9 (1.753 m)   Wt 50.7 kg   BMI 16.51 kg/m  General:   Alert,  pleasant and cooperative in NAD Head:  Normocephalic and atraumatic. Neck:  Supple; no masses or thyromegaly. Lungs:  Clear throughout to auscultation.    Heart:  Regular rate and rhythm. Abdomen:  Soft, nontender and nondistended. Normal bowel sounds, without guarding, and without rebound.   Neurologic:  Alert and  oriented x4;  grossly normal neurologically.  Impression/Plan: [place] LITTIE Retort is here for an colonoscopy to be performed for a history of adenomatous polyps on 2020   Risks, benefits, limitations, and alternatives regarding  colonoscopy have been reviewed with the patient.  Questions have been answered.  All parties agreeable.   Brent Copping, MD  07/10/2023, 7:44 AM

## 2023-07-12 ENCOUNTER — Ambulatory Visit: Payer: Self-pay | Admitting: Gastroenterology

## 2023-07-12 ENCOUNTER — Ambulatory Visit: Admitting: Internal Medicine

## 2023-07-12 LAB — SURGICAL PATHOLOGY

## 2023-08-01 ENCOUNTER — Encounter: Payer: Self-pay | Admitting: Gastroenterology

## 2023-08-07 ENCOUNTER — Ambulatory Visit
Admission: RE | Admit: 2023-08-07 | Discharge: 2023-08-07 | Disposition: A | Discharge status: Home / Self Care | Source: Ambulatory Visit | Attending: Acute Care | Admitting: Acute Care

## 2023-08-07 DIAGNOSIS — Z87891 Personal history of nicotine dependence: Secondary | ICD-10-CM | POA: Insufficient documentation

## 2023-08-07 DIAGNOSIS — Z122 Encounter for screening for malignant neoplasm of respiratory organs: Secondary | ICD-10-CM | POA: Diagnosis present

## 2023-08-07 DIAGNOSIS — F17210 Nicotine dependence, cigarettes, uncomplicated: Secondary | ICD-10-CM | POA: Diagnosis present

## 2023-08-10 ENCOUNTER — Telehealth: Payer: Self-pay

## 2023-08-10 ENCOUNTER — Other Ambulatory Visit: Payer: Self-pay | Admitting: Internal Medicine

## 2023-08-10 DIAGNOSIS — Z8669 Personal history of other diseases of the nervous system and sense organs: Secondary | ICD-10-CM

## 2023-08-10 NOTE — Telephone Encounter (Signed)
 Copied from CRM [id number]. Topic: Clinical - Medication Question >> Aug 10, 2023 10:10 AM Delon DASEN wrote: Reason for CRM: Patient requesting gabapentin  (NEURONTIN ) 200 MG capsule instead of the 100 MG, please call [phone]

## 2023-08-10 NOTE — Telephone Encounter (Signed)
 FYI, change in dose

## 2023-08-13 ENCOUNTER — Other Ambulatory Visit: Payer: Self-pay | Admitting: Internal Medicine

## 2023-08-13 DIAGNOSIS — Z8669 Personal history of other diseases of the nervous system and sense organs: Secondary | ICD-10-CM

## 2023-08-13 MED ORDER — GABAPENTIN 100 MG PO CAPS
200.0000 mg | ORAL_CAPSULE | Freq: Every day | ORAL | 1 refills | Status: DC
Start: 2023-08-13 — End: 2023-11-09

## 2023-08-13 NOTE — Telephone Encounter (Unsigned)
 Copied from CRM #[id number]. Topic: Clinical - Prescription Issue >> Aug 13, 2023 12:29 PM Wess RAMAN wrote: Reason for CRM: Pharmacy stated they have not received the new prescription for gabapentin  (NEURONTIN ) 100 MG capsule. Patient is wondering when it will be sent to pharmacy.  Patient Callback #: [phone]  Preferred Pharmacy: [id number] [place], [place] - [address] AT [facility] [address] [facility] [address] [place] [address] Phone: [phone] Fax: [phone] Hours: Not open 24 hours

## 2023-08-13 NOTE — Telephone Encounter (Signed)
 Left message for patient

## 2023-08-13 NOTE — Telephone Encounter (Signed)
 Patient need new script sent

## 2023-08-16 ENCOUNTER — Other Ambulatory Visit: Payer: Self-pay

## 2023-08-16 DIAGNOSIS — Z122 Encounter for screening for malignant neoplasm of respiratory organs: Secondary | ICD-10-CM

## 2023-08-16 DIAGNOSIS — F17210 Nicotine dependence, cigarettes, uncomplicated: Secondary | ICD-10-CM

## 2023-08-16 DIAGNOSIS — Z87891 Personal history of nicotine dependence: Secondary | ICD-10-CM

## 2023-09-27 ENCOUNTER — Encounter: Payer: Self-pay | Admitting: Gastroenterology

## 2023-11-07 ENCOUNTER — Other Ambulatory Visit: Payer: Self-pay | Admitting: Internal Medicine

## 2023-11-07 DIAGNOSIS — Z8669 Personal history of other diseases of the nervous system and sense organs: Secondary | ICD-10-CM

## 2023-11-09 NOTE — Telephone Encounter (Signed)
 Requested Prescriptions  Pending Prescriptions Disp Refills   gabapentin  (NEURONTIN ) 100 MG capsule [Pharmacy Med Name: GABAPENTIN  100MG  CAPSULES] 180 capsule 0    Sig: TAKE 2 CAPSULES(200 MG) BY MOUTH AT BEDTIME     Neurology: Anticonvulsants - gabapentin  Passed - 11/09/2023 12:51 PM      Passed - Cr in normal range and within 360 days    Creat  Date Value Ref Range Status  04/11/2023 0.82 0.70 - 1.28 mg/dL Final         Passed - Completed PHQ-2 or PHQ-9 in the last 360 days      Passed - Valid encounter within last 12 months    Recent Outpatient Visits           7 months ago Primary hypertension   [facility] Bernardo Fend, [place]

## 2024-02-07 ENCOUNTER — Other Ambulatory Visit: Payer: Self-pay | Admitting: Internal Medicine

## 2024-02-07 DIAGNOSIS — Z8669 Personal history of other diseases of the nervous system and sense organs: Secondary | ICD-10-CM

## 2024-02-08 ENCOUNTER — Other Ambulatory Visit: Payer: Self-pay | Admitting: Internal Medicine

## 2024-02-08 DIAGNOSIS — I10 Essential (primary) hypertension: Secondary | ICD-10-CM

## 2024-02-08 NOTE — Telephone Encounter (Signed)
 Requested medication (s) are due for refill today: yes   Requested medication (s) are on the active medication list: yes   Last refill:  11/09/23 #180 0 refills  Future visit scheduled: no   Notes to clinic:  no refills remain. Do you want to refill Rx?     Requested Prescriptions  Pending Prescriptions Disp Refills   gabapentin  (NEURONTIN ) 100 MG capsule [Pharmacy Med Name: GABAPENTIN  100MG  CAPSULES] 180 capsule 0    Sig: TAKE 2 CAPSULES(200 MG) BY MOUTH AT BEDTIME     Neurology: Anticonvulsants - gabapentin  Passed - 02/08/2024  3:23 PM      Passed - Cr in normal range and within 360 days    Creat  Date Value Ref Range Status  04/11/2023 0.82 0.70 - 1.28 mg/dL Final         Passed - Completed PHQ-2 or PHQ-9 in the last 360 days      Passed - Valid encounter within last 12 months    Recent Outpatient Visits           10 months ago Primary hypertension   [facility] Bernardo Fend, [place]

## 2024-02-08 NOTE — Telephone Encounter (Signed)
 Needs appt

## 2024-02-08 NOTE — Telephone Encounter (Unsigned)
 Copied from CRM [id number]. Topic: Clinical - Medication Refill >> Feb 08, 2024  9:41 AM Willma R wrote: Medication: metoprolol  succinate (TOPROL -XL) 25 MG 24 hr tablet  Has the patient contacted their pharmacy? Yes  This is the patient's preferred pharmacy:  [id number] - [place], [place] - [address] AT [facility] [address] [facility] [address] [place] [address] Phone: [phone] Fax: [phone]  Is this the correct pharmacy for this prescription? Yes  Has the prescription been filled recently? No  Is the patient out of the medication? Yes  Has the patient been seen for an appointment in the last year OR does the patient have an upcoming appointment? Yes  Can we respond through MyChart? Yes  Agent: Please be advised that Rx refills may take up to 3 business days. We ask that you follow-up with your pharmacy.

## 2024-02-11 MED ORDER — METOPROLOL SUCCINATE ER 25 MG PO TB24: ORAL_TABLET | ORAL | 0 refills | Status: AC

## 2024-05-26 ENCOUNTER — Ambulatory Visit (INDEPENDENT_AMBULATORY_CARE_PROVIDER_SITE_OTHER): Admitting: Vascular Surgery

## 2024-05-26 ENCOUNTER — Encounter (INDEPENDENT_AMBULATORY_CARE_PROVIDER_SITE_OTHER)

## 2024-06-19 ENCOUNTER — Ambulatory Visit
